# Patient Record
Sex: Female | Born: 1950
Health system: Southern US, Community
[De-identification: ages and names within clinical notes are randomized; demographics above are authoritative.]

## PROBLEM LIST (undated history)

## (undated) DIAGNOSIS — E538 Deficiency of other specified B group vitamins: Secondary | ICD-10-CM

## (undated) DIAGNOSIS — M839 Adult osteomalacia, unspecified: Secondary | ICD-10-CM

## (undated) DIAGNOSIS — K760 Fatty (change of) liver, not elsewhere classified: Secondary | ICD-10-CM

## (undated) DIAGNOSIS — K5090 Crohn's disease, unspecified, without complications: Secondary | ICD-10-CM

## (undated) DIAGNOSIS — F419 Anxiety disorder, unspecified: Principal | ICD-10-CM

## (undated) DIAGNOSIS — D539 Nutritional anemia, unspecified: Secondary | ICD-10-CM

## (undated) DIAGNOSIS — J302 Other seasonal allergic rhinitis: Secondary | ICD-10-CM

## (undated) DIAGNOSIS — J3089 Other allergic rhinitis: Secondary | ICD-10-CM

## (undated) DIAGNOSIS — M130 Polyarthritis, unspecified: Secondary | ICD-10-CM

## (undated) DIAGNOSIS — T7840XA Allergy, unspecified, initial encounter: Secondary | ICD-10-CM

## (undated) DIAGNOSIS — K219 Gastro-esophageal reflux disease without esophagitis: Secondary | ICD-10-CM

## (undated) DIAGNOSIS — Z8719 Personal history of other diseases of the digestive system: Secondary | ICD-10-CM

## (undated) DIAGNOSIS — R011 Cardiac murmur, unspecified: Secondary | ICD-10-CM

## (undated) DIAGNOSIS — F329 Major depressive disorder, single episode, unspecified: Principal | ICD-10-CM

## (undated) DIAGNOSIS — K648 Other hemorrhoids: Secondary | ICD-10-CM

## (undated) DIAGNOSIS — H905 Unspecified sensorineural hearing loss: Secondary | ICD-10-CM

## (undated) DIAGNOSIS — Z5189 Encounter for other specified aftercare: Secondary | ICD-10-CM

## (undated) DIAGNOSIS — H269 Unspecified cataract: Secondary | ICD-10-CM

## (undated) DIAGNOSIS — I10 Essential (primary) hypertension: Secondary | ICD-10-CM

## (undated) HISTORY — DX: Encounter for other specified aftercare: Z51.89

## (undated) HISTORY — DX: Allergy, unspecified, initial encounter: T78.40XA

## (undated) HISTORY — DX: Fatty (change of) liver, not elsewhere classified: K76.0

## (undated) HISTORY — DX: Crohn's disease, unspecified, without complications: K50.90

## (undated) HISTORY — PX: OTHER SURGICAL HISTORY: SHX169

## (undated) HISTORY — DX: Other hemorrhoids: K64.8

## (undated) HISTORY — DX: Personal history of other diseases of the digestive system: Z87.19

## (undated) HISTORY — PX: APPENDECTOMY: SHX54

## (undated) HISTORY — DX: Essential (primary) hypertension: I10

## (undated) HISTORY — DX: Other seasonal allergic rhinitis: J30.2

## (undated) HISTORY — PX: UPPER GASTROINTESTINAL ENDOSCOPY: SHX188

## (undated) HISTORY — DX: Deficiency of other specified B group vitamins: E53.8

## (undated) HISTORY — DX: Adult osteomalacia, unspecified: M83.9

## (undated) HISTORY — DX: Major depressive disorder, single episode, unspecified: F32.9

## (undated) HISTORY — PX: SMALL INTESTINE SURGERY: SHX150

## (undated) HISTORY — DX: Nutritional anemia, unspecified: D53.9

## (undated) HISTORY — DX: Polyarthritis, unspecified: M13.0

## (undated) HISTORY — DX: Gastro-esophageal reflux disease without esophagitis: K21.9

## (undated) HISTORY — DX: Unspecified sensorineural hearing loss: H90.5

## (undated) HISTORY — DX: Cardiac murmur, unspecified: R01.1

## (undated) HISTORY — DX: Unspecified cataract: H26.9

## (undated) HISTORY — DX: Other seasonal allergic rhinitis: J30.89

## (undated) HISTORY — DX: Anxiety disorder, unspecified: F41.9

---

## 1973-10-26 HISTORY — PX: TOTAL ABDOMINAL HYSTERECTOMY: SHX209

## 1978-10-26 HISTORY — PX: HEMICOLECTOMY: SHX854

## 2003-12-05 ENCOUNTER — Encounter: Admission: RE | Admit: 2003-12-05 | Discharge: 2003-12-05 | Payer: Self-pay | Admitting: Family Medicine

## 2004-07-15 ENCOUNTER — Other Ambulatory Visit: Admission: RE | Admit: 2004-07-15 | Discharge: 2004-07-15 | Payer: Self-pay | Admitting: *Deleted

## 2004-07-15 ENCOUNTER — Encounter: Payer: Self-pay | Admitting: Family Medicine

## 2004-07-15 LAB — CONVERTED CEMR LAB: Pap Smear: NORMAL

## 2004-10-21 ENCOUNTER — Ambulatory Visit: Payer: Self-pay | Admitting: Internal Medicine

## 2004-12-08 ENCOUNTER — Ambulatory Visit: Payer: Self-pay | Admitting: Internal Medicine

## 2004-12-10 ENCOUNTER — Ambulatory Visit: Payer: Self-pay | Admitting: Internal Medicine

## 2005-01-02 ENCOUNTER — Encounter: Admission: RE | Admit: 2005-01-02 | Discharge: 2005-01-02 | Payer: Self-pay | Admitting: Family Medicine

## 2005-02-25 ENCOUNTER — Ambulatory Visit: Payer: Self-pay | Admitting: Internal Medicine

## 2005-02-25 HISTORY — PX: COLONOSCOPY: SHX174

## 2005-02-25 HISTORY — PX: ESOPHAGOGASTRODUODENOSCOPY: SHX1529

## 2005-03-02 ENCOUNTER — Encounter: Payer: Self-pay | Admitting: Internal Medicine

## 2005-03-02 ENCOUNTER — Ambulatory Visit (HOSPITAL_COMMUNITY): Admission: RE | Admit: 2005-03-02 | Discharge: 2005-03-02 | Payer: Self-pay | Admitting: Internal Medicine

## 2005-03-30 ENCOUNTER — Ambulatory Visit: Payer: Self-pay | Admitting: Internal Medicine

## 2005-05-12 ENCOUNTER — Ambulatory Visit: Payer: Self-pay | Admitting: Internal Medicine

## 2005-05-13 ENCOUNTER — Ambulatory Visit: Payer: Self-pay | Admitting: Internal Medicine

## 2005-05-14 ENCOUNTER — Ambulatory Visit: Payer: Self-pay | Admitting: Internal Medicine

## 2005-05-28 ENCOUNTER — Ambulatory Visit: Payer: Self-pay | Admitting: Internal Medicine

## 2006-01-14 ENCOUNTER — Encounter: Admission: RE | Admit: 2006-01-14 | Discharge: 2006-01-14 | Payer: Self-pay | Admitting: Family Medicine

## 2006-01-22 ENCOUNTER — Ambulatory Visit: Payer: Self-pay | Admitting: Family Medicine

## 2006-01-26 ENCOUNTER — Ambulatory Visit: Payer: Self-pay | Admitting: Family Medicine

## 2006-01-29 ENCOUNTER — Encounter: Admission: RE | Admit: 2006-01-29 | Discharge: 2006-01-29 | Payer: Self-pay | Admitting: Family Medicine

## 2006-02-04 ENCOUNTER — Ambulatory Visit: Payer: Self-pay | Admitting: Family Medicine

## 2006-03-11 ENCOUNTER — Ambulatory Visit: Payer: Self-pay | Admitting: Family Medicine

## 2006-03-11 LAB — FECAL OCCULT BLOOD, GUAIAC: Fecal Occult Blood: NEGATIVE

## 2006-06-02 ENCOUNTER — Ambulatory Visit: Payer: Self-pay | Admitting: Family Medicine

## 2006-06-03 ENCOUNTER — Ambulatory Visit: Payer: Self-pay | Admitting: Professional

## 2006-06-17 ENCOUNTER — Ambulatory Visit: Payer: Self-pay | Admitting: Family Medicine

## 2006-10-31 ENCOUNTER — Encounter: Payer: Self-pay | Admitting: Internal Medicine

## 2007-01-17 ENCOUNTER — Encounter: Admission: RE | Admit: 2007-01-17 | Discharge: 2007-01-17 | Payer: Self-pay | Admitting: Family Medicine

## 2007-01-19 ENCOUNTER — Encounter: Payer: Self-pay | Admitting: Family Medicine

## 2007-01-19 DIAGNOSIS — Z9049 Acquired absence of other specified parts of digestive tract: Secondary | ICD-10-CM | POA: Insufficient documentation

## 2007-01-19 DIAGNOSIS — K5090 Crohn's disease, unspecified, without complications: Secondary | ICD-10-CM | POA: Insufficient documentation

## 2007-01-19 DIAGNOSIS — E8941 Symptomatic postprocedural ovarian failure: Secondary | ICD-10-CM

## 2007-03-17 ENCOUNTER — Ambulatory Visit: Payer: Self-pay | Admitting: Internal Medicine

## 2007-03-25 ENCOUNTER — Telehealth (INDEPENDENT_AMBULATORY_CARE_PROVIDER_SITE_OTHER): Payer: Self-pay | Admitting: *Deleted

## 2007-03-30 ENCOUNTER — Encounter (INDEPENDENT_AMBULATORY_CARE_PROVIDER_SITE_OTHER): Payer: Self-pay | Admitting: *Deleted

## 2007-03-30 ENCOUNTER — Emergency Department (HOSPITAL_COMMUNITY): Admission: EM | Admit: 2007-03-30 | Discharge: 2007-03-30 | Payer: Self-pay | Admitting: Emergency Medicine

## 2007-03-31 ENCOUNTER — Inpatient Hospital Stay (HOSPITAL_COMMUNITY): Admission: EM | Admit: 2007-03-31 | Discharge: 2007-04-03 | Payer: Self-pay | Admitting: Emergency Medicine

## 2007-04-06 ENCOUNTER — Ambulatory Visit: Payer: Self-pay | Admitting: Internal Medicine

## 2007-04-07 ENCOUNTER — Ambulatory Visit: Payer: Self-pay | Admitting: Internal Medicine

## 2007-05-02 ENCOUNTER — Encounter (INDEPENDENT_AMBULATORY_CARE_PROVIDER_SITE_OTHER): Payer: Self-pay | Admitting: *Deleted

## 2007-05-17 ENCOUNTER — Ambulatory Visit: Payer: Self-pay | Admitting: Internal Medicine

## 2007-08-03 ENCOUNTER — Ambulatory Visit: Payer: Self-pay | Admitting: Family Medicine

## 2007-08-03 LAB — CONVERTED CEMR LAB: Anti Nuclear Antibody(ANA): NEGATIVE

## 2007-08-04 ENCOUNTER — Encounter: Admission: RE | Admit: 2007-08-04 | Discharge: 2007-08-04 | Payer: Self-pay | Admitting: Family Medicine

## 2007-08-05 ENCOUNTER — Telehealth: Payer: Self-pay | Admitting: Family Medicine

## 2007-08-05 LAB — CONVERTED CEMR LAB: Rhuematoid fact SerPl-aCnc: <20 intl units/mL — ABNORMAL LOW (ref 0.0–20.0)

## 2007-08-14 ENCOUNTER — Encounter: Admission: RE | Admit: 2007-08-14 | Discharge: 2007-08-14 | Payer: Self-pay | Admitting: Orthopedic Surgery

## 2007-11-03 ENCOUNTER — Ambulatory Visit: Payer: Self-pay | Admitting: Internal Medicine

## 2007-11-08 ENCOUNTER — Ambulatory Visit: Payer: Self-pay | Admitting: Internal Medicine

## 2007-11-08 LAB — CONVERTED CEMR LAB
BUN: 5 mg/dL — ABNORMAL LOW (ref 6–23)
Creatinine, Ser: 0.7 mg/dL (ref 0.4–1.2)

## 2007-11-10 ENCOUNTER — Encounter: Admission: RE | Admit: 2007-11-10 | Discharge: 2007-11-10 | Payer: Self-pay | Admitting: Internal Medicine

## 2008-01-17 ENCOUNTER — Ambulatory Visit: Payer: Self-pay | Admitting: Internal Medicine

## 2008-01-17 LAB — CONVERTED CEMR LAB
Basophils Absolute: 0.1 10*3/uL (ref 0.0–0.1)
Basophils Relative: 0.9 % (ref 0.0–1.0)
Eosinophils Absolute: 0.1 10*3/uL (ref 0.0–0.6)
Eosinophils Relative: 2.1 % (ref 0.0–5.0)
Hemoglobin: 12 g/dL (ref 12.0–15.0)
Monocytes Absolute: 0.5 10*3/uL (ref 0.2–0.7)
Neutrophils Relative %: 56.6 % (ref 43.0–77.0)
RBC: 3.79 M/uL — ABNORMAL LOW (ref 3.87–5.11)
RDW: 12.7 % (ref 11.5–14.6)
WBC: 6.3 10*3/uL (ref 4.5–10.5)

## 2008-01-18 ENCOUNTER — Encounter: Admission: RE | Admit: 2008-01-18 | Discharge: 2008-01-18 | Payer: Self-pay | Admitting: Family Medicine

## 2008-01-19 ENCOUNTER — Encounter (INDEPENDENT_AMBULATORY_CARE_PROVIDER_SITE_OTHER): Payer: Self-pay | Admitting: *Deleted

## 2008-03-21 IMAGING — CR DG SHOULDER 2+V*L*
3 series · 3 of 3 positions shown · non-contrast
Comparison: none

CLINICAL DATA: Left shoulder pain since [REDACTED]. 
 LEFT SHOULDER ? THREE VIEWS:
 Glenohumeral joint is unremarkable.  There is a normal distance between the humeral head and the acromial arch.  There may be a subacromial spur which could predispose to impingement.  The AC joint is unremarkable.  No sign of AVN or other focal lesion.

[w shoulder ap internal left]
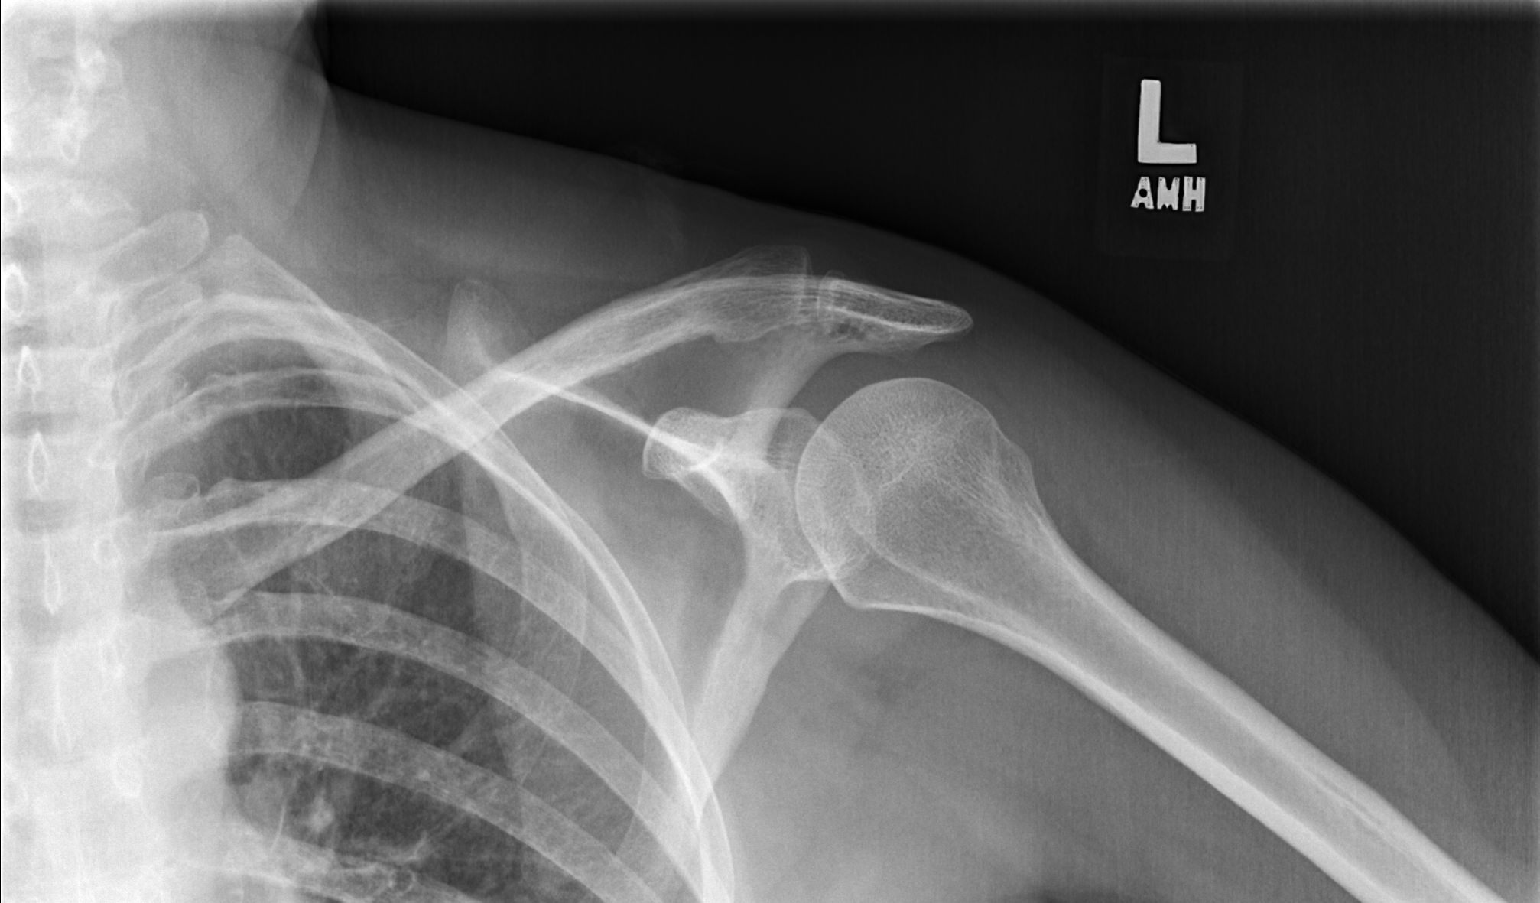

[w shoulder ap external left *]
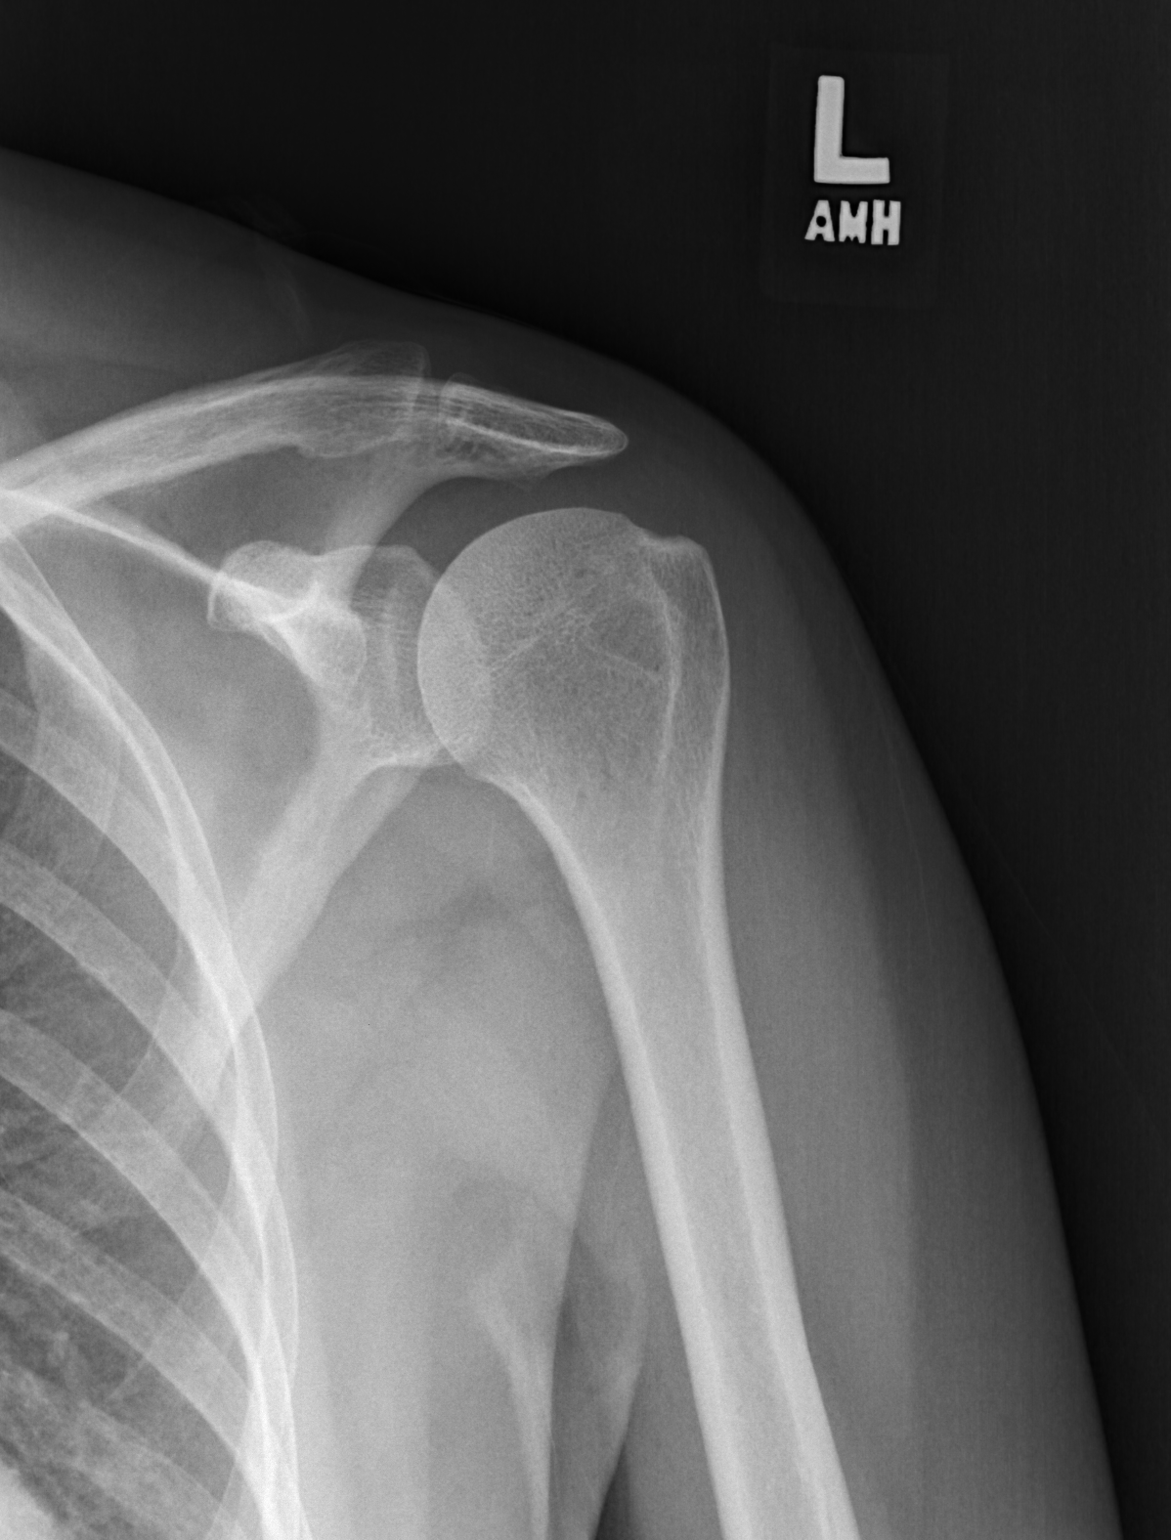

[w shoulder axillary left *]
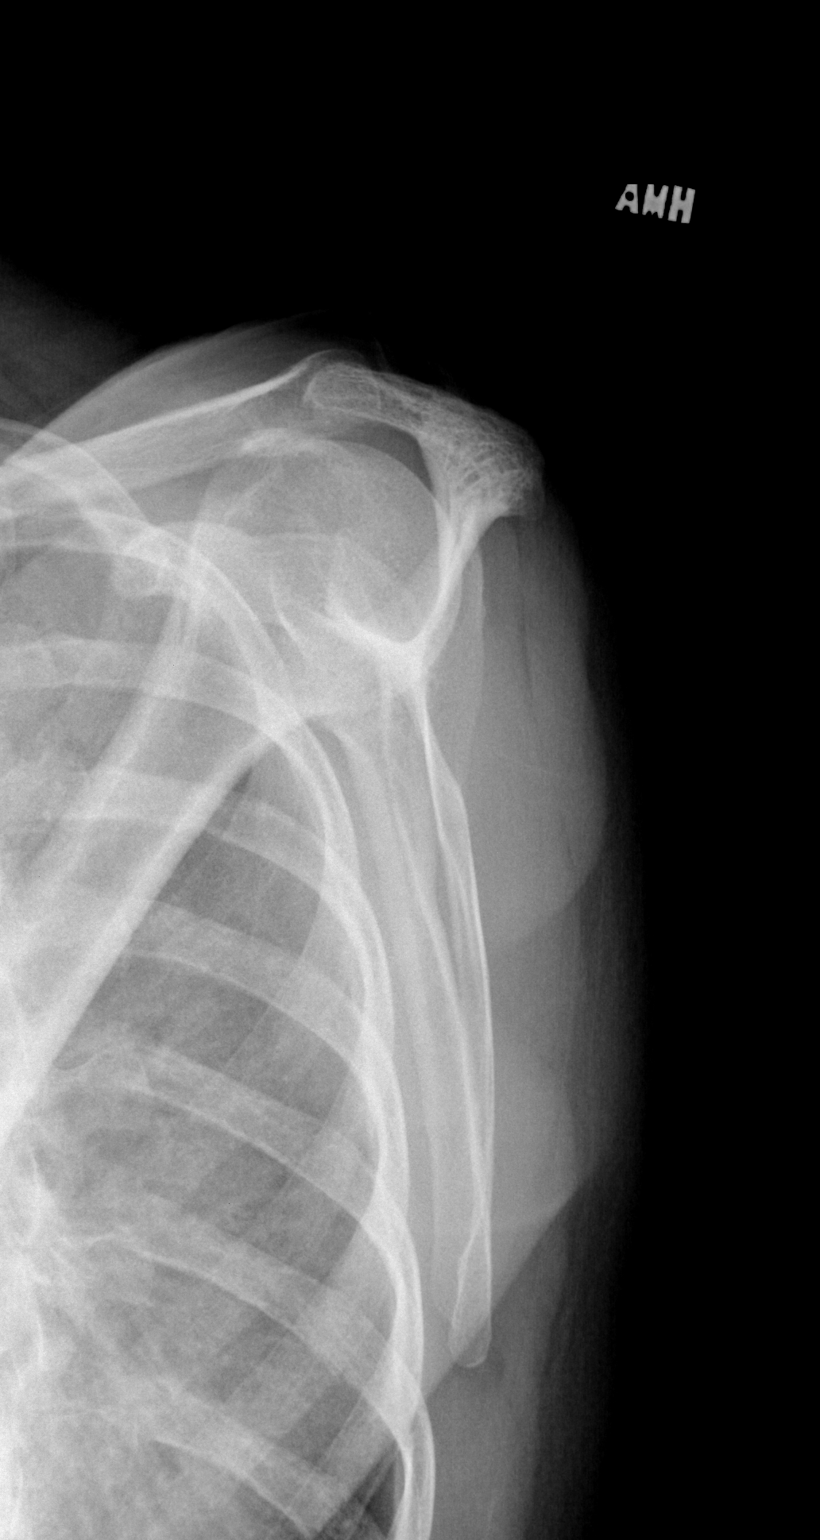

[3 of 3 positions shown; findings below may reference images not displayed]

IMPRESSION: No pronounced abnormality.  Subacromial spur could predispose to rotator cuff disease.

## 2008-07-11 ENCOUNTER — Ambulatory Visit: Payer: Self-pay | Admitting: Family Medicine

## 2008-07-13 ENCOUNTER — Telehealth (INDEPENDENT_AMBULATORY_CARE_PROVIDER_SITE_OTHER): Payer: Self-pay | Admitting: *Deleted

## 2008-08-27 ENCOUNTER — Ambulatory Visit: Payer: Self-pay | Admitting: Family Medicine

## 2008-08-27 DIAGNOSIS — I10 Essential (primary) hypertension: Secondary | ICD-10-CM

## 2008-10-04 ENCOUNTER — Ambulatory Visit: Payer: Self-pay | Admitting: Family Medicine

## 2008-10-04 DIAGNOSIS — M839 Adult osteomalacia, unspecified: Secondary | ICD-10-CM | POA: Insufficient documentation

## 2008-10-04 LAB — CONVERTED CEMR LAB
Basophils Absolute: 0 10*3/uL (ref 0.0–0.1)
Calcium: 9.6 mg/dL (ref 8.4–10.5)
Creatinine, Ser: 0.8 mg/dL (ref 0.4–1.2)
Eosinophils Absolute: 0.1 10*3/uL (ref 0.0–0.7)
Eosinophils Relative: 2.1 % (ref 0.0–5.0)
GFR calc Af Amer: 95 mL/min
GFR calc non Af Amer: 78 mL/min
Hemoglobin: 13.3 g/dL (ref 12.0–15.0)
Monocytes Relative: 7.4 % (ref 3.0–12.0)
Neutro Abs: 2.8 10*3/uL (ref 1.4–7.7)
WBC: 4.5 10*3/uL (ref 4.5–10.5)

## 2008-10-09 ENCOUNTER — Ambulatory Visit: Payer: Self-pay | Admitting: Family Medicine

## 2009-01-22 ENCOUNTER — Encounter (INDEPENDENT_AMBULATORY_CARE_PROVIDER_SITE_OTHER): Payer: Self-pay | Admitting: *Deleted

## 2009-01-22 ENCOUNTER — Encounter: Admission: RE | Admit: 2009-01-22 | Discharge: 2009-01-22 | Payer: Self-pay | Admitting: Family Medicine

## 2009-02-15 ENCOUNTER — Telehealth: Payer: Self-pay | Admitting: Internal Medicine

## 2009-02-28 ENCOUNTER — Ambulatory Visit: Payer: Self-pay | Admitting: Internal Medicine

## 2009-03-01 ENCOUNTER — Telehealth: Payer: Self-pay | Admitting: Internal Medicine

## 2009-04-03 ENCOUNTER — Ambulatory Visit: Payer: Self-pay | Admitting: Family Medicine

## 2009-04-03 LAB — CONVERTED CEMR LAB
ALT: 43 units/L — ABNORMAL HIGH (ref 0–35)
AST: 42 units/L — ABNORMAL HIGH (ref 0–37)
Alkaline Phosphatase: 74 units/L (ref 39–117)
Bilirubin, Direct: 0.1 mg/dL (ref 0.0–0.3)
Cholesterol: 165 mg/dL (ref 0–200)
Creatinine, Ser: 0.7 mg/dL (ref 0.4–1.2)
Eosinophils Relative: 2.2 % (ref 0.0–5.0)
GFR calc non Af Amer: 91.15 mL/min (ref >60)
HCT: 35.2 % — ABNORMAL LOW (ref 36.0–46.0)
HDL: 76.1 mg/dL (ref >39.00)
Hemoglobin: 12.4 g/dL (ref 12.0–15.0)
LDL Cholesterol: 79 mg/dL (ref 0–99)
MCHC: 35.1 g/dL (ref 30.0–36.0)
MCV: 99.5 fL (ref 78.0–100.0)
Neutro Abs: 2.3 10*3/uL (ref 1.4–7.7)
RBC: 3.54 M/uL — ABNORMAL LOW (ref 3.87–5.11)
Total Bilirubin: 1 mg/dL (ref 0.3–1.2)
Triglycerides: 50 mg/dL (ref 0.0–149.0)
VLDL: 10 mg/dL (ref 0.0–40.0)
WBC: 3.8 10*3/uL — ABNORMAL LOW (ref 4.5–10.5)

## 2009-04-10 ENCOUNTER — Ambulatory Visit: Payer: Self-pay | Admitting: Family Medicine

## 2009-04-10 DIAGNOSIS — E538 Deficiency of other specified B group vitamins: Secondary | ICD-10-CM

## 2009-04-10 DIAGNOSIS — R197 Diarrhea, unspecified: Secondary | ICD-10-CM

## 2009-04-10 DIAGNOSIS — M130 Polyarthritis, unspecified: Secondary | ICD-10-CM

## 2009-04-17 ENCOUNTER — Ambulatory Visit: Payer: Self-pay | Admitting: Family Medicine

## 2009-04-19 LAB — CONVERTED CEMR LAB: Vit D, 25-Hydroxy: 32 ng/mL (ref 30–89)

## 2009-05-01 ENCOUNTER — Telehealth: Payer: Self-pay | Admitting: Internal Medicine

## 2009-07-22 ENCOUNTER — Encounter: Payer: Self-pay | Admitting: Internal Medicine

## 2009-10-29 ENCOUNTER — Telehealth: Payer: Self-pay | Admitting: Internal Medicine

## 2009-10-30 ENCOUNTER — Ambulatory Visit: Payer: Self-pay | Admitting: Internal Medicine

## 2009-10-31 LAB — CONVERTED CEMR LAB
ALT: 27 units/L (ref 0–35)
AST: 27 units/L (ref 0–37)
BUN: 9 mg/dL (ref 6–23)
Basophils Absolute: 0 10*3/uL (ref 0.0–0.1)
Basophils Relative: 0.2 % (ref 0.0–3.0)
Eosinophils Relative: 1.2 % (ref 0.0–5.0)
Glucose, Bld: 95 mg/dL (ref 70–99)
HCT: 36 % (ref 36.0–46.0)
Hemoglobin: 11.8 g/dL — ABNORMAL LOW (ref 12.0–15.0)
MCHC: 32.7 g/dL (ref 30.0–36.0)
MCV: 101.3 fL — ABNORMAL HIGH (ref 78.0–100.0)
Monocytes Absolute: 0.6 10*3/uL (ref 0.1–1.0)
Neutrophils Relative %: 54.9 % (ref 43.0–77.0)
RDW: 14.3 % (ref 11.5–14.6)
Saturation Ratios: 14.6 % — ABNORMAL LOW (ref 20.0–50.0)
Sed Rate: 25 mm/hr — ABNORMAL HIGH (ref 0–22)
Total Bilirubin: 0.9 mg/dL (ref 0.3–1.2)

## 2009-12-27 ENCOUNTER — Telehealth: Payer: Self-pay | Admitting: Internal Medicine

## 2010-01-02 ENCOUNTER — Encounter: Payer: Self-pay | Admitting: Internal Medicine

## 2010-01-27 ENCOUNTER — Encounter (INDEPENDENT_AMBULATORY_CARE_PROVIDER_SITE_OTHER): Payer: Self-pay | Admitting: *Deleted

## 2010-01-27 ENCOUNTER — Encounter: Admission: RE | Admit: 2010-01-27 | Discharge: 2010-01-27 | Payer: Self-pay | Admitting: Family Medicine

## 2010-01-27 LAB — HM MAMMOGRAPHY: HM Mammogram: NORMAL

## 2010-02-06 ENCOUNTER — Encounter (INDEPENDENT_AMBULATORY_CARE_PROVIDER_SITE_OTHER): Payer: Self-pay | Admitting: *Deleted

## 2010-02-10 ENCOUNTER — Ambulatory Visit: Payer: Self-pay | Admitting: Internal Medicine

## 2010-02-20 ENCOUNTER — Encounter: Payer: Self-pay | Admitting: Internal Medicine

## 2010-02-26 ENCOUNTER — Ambulatory Visit: Payer: Self-pay | Admitting: Internal Medicine

## 2010-02-26 HISTORY — PX: COLONOSCOPY: SHX174

## 2010-02-26 LAB — HM COLONOSCOPY

## 2010-02-27 ENCOUNTER — Telehealth: Payer: Self-pay | Admitting: Internal Medicine

## 2010-02-27 ENCOUNTER — Encounter: Payer: Self-pay | Admitting: Internal Medicine

## 2010-03-25 ENCOUNTER — Telehealth: Payer: Self-pay | Admitting: Internal Medicine

## 2010-03-27 ENCOUNTER — Ambulatory Visit: Payer: Self-pay | Admitting: Internal Medicine

## 2010-03-28 LAB — CONVERTED CEMR LAB
ALT: 25 units/L (ref 0–35)
Albumin: 4 g/dL (ref 3.5–5.2)
Basophils Absolute: 0 10*3/uL (ref 0.0–0.1)
Bilirubin, Direct: 0.1 mg/dL (ref 0.0–0.3)
CO2: 32 meq/L (ref 19–32)
Calcium: 9 mg/dL (ref 8.4–10.5)
Creatinine, Ser: 0.6 mg/dL (ref 0.4–1.2)
HCT: 34.2 % — ABNORMAL LOW (ref 36.0–46.0)
Hemoglobin: 11.9 g/dL — ABNORMAL LOW (ref 12.0–15.0)
Iron: 60 ug/dL (ref 42–145)
Lymphs Abs: 1.7 10*3/uL (ref 0.7–4.0)
MCV: 98.9 fL (ref 78.0–100.0)
Neutrophils Relative %: 55.1 % (ref 43.0–77.0)
Total Bilirubin: 0.7 mg/dL (ref 0.3–1.2)
Vitamin B-12: 367 pg/mL (ref 211–911)
WBC: 5.1 10*3/uL (ref 4.5–10.5)

## 2010-04-09 ENCOUNTER — Encounter: Payer: Self-pay | Admitting: Family Medicine

## 2010-04-09 ENCOUNTER — Ambulatory Visit: Payer: Self-pay | Admitting: Family Medicine

## 2010-04-10 LAB — CONVERTED CEMR LAB
AST: 27 units/L (ref 0–37)
Albumin: 4.5 g/dL (ref 3.5–5.2)
Basophils Absolute: 0 10*3/uL (ref 0.0–0.1)
Basophils Relative: 0.6 % (ref 0.0–3.0)
CO2: 30 meq/L (ref 19–32)
Chloride: 102 meq/L (ref 96–112)
Eosinophils Relative: 1.4 % (ref 0.0–5.0)
Glucose, Bld: 85 mg/dL (ref 70–99)
LDL Cholesterol: 97 mg/dL (ref 0–99)
MCV: 100.9 fL — ABNORMAL HIGH (ref 78.0–100.0)
Monocytes Relative: 8.7 % (ref 3.0–12.0)
Neutro Abs: 2.9 10*3/uL (ref 1.4–7.7)
Neutrophils Relative %: 53.1 % (ref 43.0–77.0)
Platelets: 263 10*3/uL (ref 150.0–400.0)
Potassium: 4.1 meq/L (ref 3.5–5.1)
Sodium: 139 meq/L (ref 135–145)
TSH: 3.29 microintl units/mL (ref 0.35–5.50)
Total CHOL/HDL Ratio: 2
Triglycerides: 46 mg/dL (ref 0.0–149.0)
VLDL: 9.2 mg/dL (ref 0.0–40.0)
Vit D, 25-Hydroxy: 30 ng/mL (ref 30–89)

## 2010-04-15 ENCOUNTER — Ambulatory Visit: Payer: Self-pay | Admitting: Family Medicine

## 2010-04-15 DIAGNOSIS — D509 Iron deficiency anemia, unspecified: Secondary | ICD-10-CM | POA: Insufficient documentation

## 2010-04-15 DIAGNOSIS — H6120 Impacted cerumen, unspecified ear: Secondary | ICD-10-CM

## 2010-05-29 ENCOUNTER — Encounter (INDEPENDENT_AMBULATORY_CARE_PROVIDER_SITE_OTHER): Payer: Self-pay | Admitting: *Deleted

## 2010-08-14 ENCOUNTER — Encounter (INDEPENDENT_AMBULATORY_CARE_PROVIDER_SITE_OTHER): Payer: Self-pay | Admitting: *Deleted

## 2010-08-15 ENCOUNTER — Encounter: Payer: Self-pay | Admitting: Internal Medicine

## 2010-08-15 ENCOUNTER — Ambulatory Visit: Payer: Self-pay | Admitting: Internal Medicine

## 2010-08-15 DIAGNOSIS — R51 Headache: Secondary | ICD-10-CM | POA: Insufficient documentation

## 2010-08-15 DIAGNOSIS — R519 Headache, unspecified: Secondary | ICD-10-CM | POA: Insufficient documentation

## 2010-08-15 LAB — CONVERTED CEMR LAB
Specific Gravity, Urine: 1.025
Urine crystals, microscopic: 0 /hpf

## 2010-08-16 ENCOUNTER — Encounter: Payer: Self-pay | Admitting: Family Medicine

## 2010-08-19 ENCOUNTER — Ambulatory Visit: Payer: Self-pay | Admitting: Internal Medicine

## 2010-08-19 ENCOUNTER — Ambulatory Visit: Payer: Self-pay | Admitting: Family Medicine

## 2010-08-19 DIAGNOSIS — R109 Unspecified abdominal pain: Secondary | ICD-10-CM | POA: Insufficient documentation

## 2010-08-19 LAB — CONVERTED CEMR LAB
Bilirubin Urine: NEGATIVE
Glucose, Urine, Semiquant: NEGATIVE
Ketones, urine, test strip: NEGATIVE
Nitrite: NEGATIVE
Protein, U semiquant: NEGATIVE
Urobilinogen, UA: 0.2
WBC Urine, dipstick: NEGATIVE
pH: 5

## 2010-09-05 ENCOUNTER — Telehealth: Payer: Self-pay | Admitting: Internal Medicine

## 2010-09-05 ENCOUNTER — Ambulatory Visit: Payer: Self-pay | Admitting: Internal Medicine

## 2010-09-05 ENCOUNTER — Encounter: Payer: Self-pay | Admitting: Family Medicine

## 2010-09-05 DIAGNOSIS — R0982 Postnasal drip: Secondary | ICD-10-CM

## 2010-09-05 LAB — CONVERTED CEMR LAB
Glucose, Urine, Semiquant: NEGATIVE
WBC Urine, dipstick: NEGATIVE

## 2010-09-08 LAB — CONVERTED CEMR LAB: Crystals: NONE SEEN

## 2010-09-29 ENCOUNTER — Ambulatory Visit: Payer: Self-pay | Admitting: Internal Medicine

## 2010-10-31 ENCOUNTER — Encounter: Payer: Self-pay | Admitting: Internal Medicine

## 2010-11-04 ENCOUNTER — Ambulatory Visit
Admission: RE | Admit: 2010-11-04 | Discharge: 2010-11-04 | Payer: Self-pay | Source: Home / Self Care | Attending: Internal Medicine | Admitting: Internal Medicine

## 2010-11-04 ENCOUNTER — Other Ambulatory Visit: Payer: Self-pay | Admitting: Internal Medicine

## 2010-11-04 LAB — CBC WITH DIFFERENTIAL/PLATELET
Basophils Absolute: 0 10*3/uL (ref 0.0–0.1)
Basophils Relative: 0.4 % (ref 0.0–3.0)
Eosinophils Absolute: 0.1 10*3/uL (ref 0.0–0.7)
Eosinophils Relative: 0.9 % (ref 0.0–5.0)
HCT: 34 % — ABNORMAL LOW (ref 36.0–46.0)
Hemoglobin: 11.7 g/dL — ABNORMAL LOW (ref 12.0–15.0)
Lymphocytes Relative: 31.2 % (ref 12.0–46.0)
Lymphs Abs: 1.8 10*3/uL (ref 0.7–4.0)
MCHC: 34.5 g/dL (ref 30.0–36.0)
MCV: 98.8 fl (ref 78.0–100.0)
Monocytes Absolute: 0.5 10*3/uL (ref 0.1–1.0)
Monocytes Relative: 8.2 % (ref 3.0–12.0)
Neutro Abs: 3.5 10*3/uL (ref 1.4–7.7)
Neutrophils Relative %: 59.3 % (ref 43.0–77.0)
Platelets: 313 10*3/uL (ref 150.0–400.0)
RBC: 3.44 Mil/uL — ABNORMAL LOW (ref 3.87–5.11)
RDW: 14.2 % (ref 11.5–14.6)
WBC: 5.8 10*3/uL (ref 4.5–10.5)

## 2010-11-04 LAB — FERRITIN: Ferritin: 193.6 ng/mL (ref 10.0–291.0)

## 2010-11-04 LAB — IBC PANEL
Iron: 87 ug/dL (ref 42–145)
Saturation Ratios: 21.3 % (ref 20.0–50.0)
Transferrin: 291.7 mg/dL (ref 212.0–360.0)

## 2010-11-04 LAB — B12 AND FOLATE PANEL
Folate: 14.2 ng/mL
Vitamin B-12: 802 pg/mL (ref 211–911)

## 2010-11-07 ENCOUNTER — Ambulatory Visit
Admission: RE | Admit: 2010-11-07 | Discharge: 2010-11-07 | Payer: Self-pay | Source: Home / Self Care | Attending: Internal Medicine | Admitting: Internal Medicine

## 2010-11-13 ENCOUNTER — Encounter: Payer: Self-pay | Admitting: Internal Medicine

## 2010-11-25 NOTE — Progress Notes (Signed)
        Additional Follow-up for Phone Call Additional follow up Details #2::    During call back this morning patient states she does not feel well had fever "101" lastnight,  abd. cramps and soreness. Had food lastnight, nausea this am. patient is at work. patient did state she had hard time with prep. [place] notified and then notified Dr.Uriah Trueba. order given to take cipro 251m two times a day x 7days given patient history of crohns. Notified patient of this and encouraged patient to call [place] today if not better and to drink fluids. pt understands and agrees. Follow-up by: RSundra AlandRN,  Feb 27, 2010 10:43 AM

## 2010-11-25 NOTE — Assessment & Plan Note (Signed)
Summary: 3 WEEK FOLLOW UP/[facility]   Vital Signs:  Patient profile:   60 year old female Weight:      114 pounds Temp:     98.1 degrees F oral Pulse rate:   68 / minute Pulse rhythm:   regular BP sitting:   110 / 70  (left arm) Cuff size:   regular  Vitals Entered By: Maudie Mercury Dance CMA Deborra Medina) (September 05, 2010 3:58 PM) CC: 3 week follow up   History of Present Illness: CC: f/u blood in urine.  Still with burning with urination.  still with mild urgency.  Overall feeling better though.  no noted gross hematuria.  Again having sinus congestion.  Now with sores in nose on L side.  No discharge, mild postnasal drip.  + having to clear throat often.  Does get hoarse on occasion.  Eyes burning some and swollen.  + chills last night.  No fevers.  + sinus headaches.  No purulent mucous.  No ear pain/tooth pain.  No sick contacts at home (lives alone).    chrohn's disease, stable currently.  Current Medications (verified): 1)  Mercaptopurine 50 Mg Tabs (Mercaptopurine) .... Take 1 Tablet By Mouth Once A Day. 2)  Amlodipine Besylate 5 Mg Tabs (Amlodipine Besylate) .... One Tab By Mouth Once Daily 3)  Ferrous Sulfate 325 (65 Fe) Mg Tabs (Ferrous Sulfate) .... Take As Directed. Must Have Office Visit For Further Refills! 4)  Cyanocobalamin 1000 Mcg/ml Soln (Cyanocobalamin) .... Once A Month At Work  Allergies (verified): No Known Drug Allergies  Past History:  Past Medical History: Last updated: 08/15/2010 HTN Chrohn's Disease s/p colectomy h/o SBO chronic diarrhea OSTEOMALACIA, UNSPECIFIED (ICD-268.2) Hx of MACROCYTIC ANEMIA (ICD-281.9) Hx of VITAMIN B12 DEFICIENCY (ICD-266.2) HEMORRHOIDS, INTERNAL (ICD-455.0) POLYARTHRITIS NOS, MULTIPLE SITES (ICD-716.59) MENOPAUSE, SURGICAL (ICD-627.4) LOSS, CENTRAL HEARING (ICD-389.14)    Social History: Last updated: 04/10/2009 Married  Divorced 1989   3children Occupation: Surveyor, mining Alcohol Use - no Illicit Drug Use -  no Patient is a former smoker.  Daily Caffeine Use  Past Surgical History: HYSTERECTOMY:1975 Appendectomy Ileal resection x 3 and subtotal colon resection COLONOSCOPY INT. HEMMS: CROHNS: NARROW ANAST. 02/25/2005 EGD: DILATED: 02/25/2005 DEXA NORMAL: 01/29/2006  Colonoscopy Prior R Hemicolectomy o/w benign (Dr Olevia Perches) 02/26/2010            5 yrs B 12 INJECTIONS EVERY MONTH   Review of Systems       per HPI  Physical Exam  General:  NAD, tired Head:  Normocephalic and atraumatic without obvious abnormalities. sinuses NT Eyes:  Conjunctiva clear bilaterally.  Ears:  TMs clear bilateral Nose:  External nasal examination shows no deformity or inflammation. Nasal mucosa are pink and moist without lesions or exudates. Mouth:  Oral mucosa and oropharynx without lesions or exudates.  Teeth in good repair.  No apthous ulcers. Neck:  Supple; no masses or thyromegaly. Lungs:  Normal respiratory effort, chest expands symmetrically. Lungs are clear to auscultation, no crackles or wheezes. Heart:  Normal rate and regular rhythm. S1 and S2 normal without gallop, murmur, click, rub or other extra sounds. Pulses:  2+ rad pulses   Impression & Recommendations:  Problem # 1:  MICROSCOPIC HEMATURIA (ICD-599.72) UA with small blood.  check micro again (sent to lab).  if >3 RBC/HPF, referral to uro for eval persistent micro hematuria.  initially thought to be nephrolithiasis but none seen on KUB previously, resolved on own without needing flomax.  The following medications were removed from the medication list:  Bactrim Ds 800-160 Mg Tabs (Sulfamethoxazole-trimethoprim) ..... One tablet two times a day x 10 days  Orders: UA Dipstick w/o Micro (manual) (81002) T- * Misc. Laboratory test [phone])  Problem # 2:  POSTNASAL DRIP SYNDROME (ICD-784.91) likely from rhinitis.  trial of antihistamine.  holding off on nasal steroid 2/2 hoarseness.  Complete Medication List: 1)  Mercaptopurine 50 Mg Tabs  (Mercaptopurine) .... Take 1 tablet by mouth once a day. 2)  Amlodipine Besylate 5 Mg Tabs (Amlodipine besylate) .... One tab by mouth once daily 3)  Ferrous Sulfate 325 (65 Fe) Mg Tabs (Ferrous sulfate) .... Take as directed. must have office visit for further refills! 4)  Cyanocobalamin 1000 Mcg/ml Soln (Cyanocobalamin) .... Once a month at work  Patient Instructions: 1)  Start with claritin daily to help sinus congestion.   2)  rechecking urine today.  we will call you with results. 3)  Good to see you today! call clinic with questions.   Orders Added: 1)  UA Dipstick w/o Micro (manual) [81002] 2)  T- * Misc. Laboratory test [99999] 3)  Est. Patient Level III [12224]    Current Allergies (reviewed today): No known allergies   Laboratory Results   Urine Tests  Date/Time Received: September 05, 2010 4:46 PM   Routine Urinalysis   Color: yellow Appearance: Clear Glucose: negative   (Normal Range: Negative) Bilirubin: negative   (Normal Range: Negative) Ketone: negative   (Normal Range: Negative) Spec. Gravity: 1.020   (Normal Range: 1.003-1.035) Blood: small   (Normal Range: Negative) pH: 6.0   (Normal Range: 5.0-8.0) Protein: negative   (Normal Range: Negative) Urobilinogen: 0.2   (Normal Range: 0-1) Nitrite: negative   (Normal Range: Negative) Leukocyte Esterace: negative   (Normal Range: Negative)

## 2010-11-25 NOTE — Letter (Signed)
Summary: [facility] Instructions  [facility]  [place], [place] [address]   Phone: [phone]  Fax: [phone]       Michelle Vasquez    October 08, 1951    MRN: [id number]       Procedure Day [name]:  [facility]  02/26/10     Arrival Time:  7:30AM     Procedure Time:  8:30AM     Location of Procedure:                    [name] _  [place] (4th Floor)    [place]  Starting 5 days prior to your procedure 02/21/10 do not eat nuts, seeds, popcorn, corn, beans, peas,  salads, or any raw vegetables.  Do not take any fiber supplements (e.g. Metamucil, Citrucel, and Benefiber). ____________________________________________________________________________________________________   THE DAY BEFORE YOUR PROCEDURE         DATE: 02/25/10  DAY: TUESDAY  1   Drink clear liquids the entire day-NO SOLID FOOD  2   Do not drink anything colored red or purple.  Avoid juices with pulp.  No orange juice.  3   Drink at least 64 oz. (8 glasses) of fluid/clear liquids during the day to prevent dehydration and help the prep work efficiently.  CLEAR LIQUIDS INCLUDE: Water Jello Ice Popsicles Tea (sugar ok, no milk/cream) Powdered fruit flavored drinks Coffee (sugar ok, no milk/cream) Gatorade Juice: apple, white grape, white cranberry  Lemonade Clear bullion, consomm, broth Carbonated beverages (any kind) Strained chicken noodle soup Hard Candy  4   Mix the entire bottle of Miralax with 64 oz. of Gatorade/Powerade in the morning and put in the refrigerator to chill.  5   At 3:00 pm take 2 Dulcolax/Bisacodyl tablets.  6   At 4:30 pm take one Reglan/Metoclopramide tablet.  7  Starting at 5:00 pm drink one 8 oz glass of the Miralax mixture every 15-20 minutes until you have finished drinking the entire 64 oz.  You should finish drinking prep around 7:30 or 8:00 pm.  8   If you are nauseated, you may take the 2nd Reglan/Metoclopramide  tablet at 6:30 pm.        9    At 8:00 pm take 2 more DULCOLAX/Bisacodyl tablets.     THE DAY OF YOUR PROCEDURE      DATE:  02/26/10  DAY: [name]  You may drink clear liquids until 6:30AM (2 HOURS BEFORE PROCEDURE).   MEDICATION INSTRUCTIONS  Unless otherwise instructed, you should take regular prescription medications with a small sip of water as early as possible the morning of your procedure.  .  Additional medication instructions: n/a         OTHER INSTRUCTIONS  You will need a responsible adult at least 61 years of age to accompany you and drive you home.   This person must remain in the waiting room during your procedure.  Wear loose fitting clothing that is easily removed.  Leave jewelry and other valuables at home.  However, you may wish to bring a book to read or an iPod/MP3 player to listen to music as you wait for your procedure to start.  Remove all body piercing jewelry and leave at home.  Total time from sign-in until discharge is approximately 2-3 hours.  You should go home directly after your procedure and rest.  You can resume normal activities the day after your procedure.  The day of your procedure  you should not:   Drive   Make legal decisions   Operate machinery   Drink alcohol   Return to work  You will receive specific instructions about eating, activities and medications before you leave.   The above instructions have been reviewed and explained to me by  [name] RN  February 10, 2010 4:46 PM      I fully understand and can verbalize these instructions _____________________________ Date _______

## 2010-11-25 NOTE — Progress Notes (Signed)
Summary: RX  Medications Added FERROUS SULFATE 325 (65 FE) MG TABS (FERROUS SULFATE) Take as directed.       Phone Note Call from Patient Call back at [phone]   Caller: Patient Call For: Dr Olevia Perches Reason for Call: Refill Medication, Talk to Nurse Details for Reason: RX Summary of Call: Pt needs script for Iron supplement so her Flex Spending will cover it. Initial call taken by: Cora Daniels,  December 27, 2009 11:00 AM  Follow-up for Phone Call        Prescription sent to patient's home. Follow-up by: Awilda Bill CMA Deborra Medina),  December 27, 2009 11:27 AM    New/Updated Medications: FERROUS SULFATE 325 (65 FE) MG TABS (FERROUS SULFATE) Take as directed. Prescriptions: FERROUS SULFATE 325 (65 FE) MG TABS (FERROUS SULFATE) Take as directed.  #100 x 0   Entered by:   Awilda Bill CMA ([place])   Authorized by:   Lafayette Dragon MD   Signed by:   Awilda Bill CMA ([place]) on 12/27/2009   Method used:   Print then Mail to Patient   RxID:   [id number]

## 2010-11-25 NOTE — Letter (Signed)
Summary: Patient Notice- Colon Biospy Results  [facility]  [address] [place], [place] [address]   Phone: [phone]  Fax: [phone]        Feb 27, 2010 MRN: [id number]    Michelle Vasquez [address] [place],   [address]    Dear [name],  I am pleased to inform you that the biopsies taken during your recent colonoscopy did not show any evidence of cancer upon pathologic examination.There is no evidence of active Crohn's disease on any of the biopsies  Additional information/recommendations:  __No further action is needed at this time.  Please follow-up with      your primary care physician for your other healthcare needs.  __Please call [phone] to schedule a return visit to review      your condition.  _x_Continue with the treatment plan as outlined on the day of your      exam.  _x_You should have a repeat colonoscopy examination for this problem           in 5_ years.  I hope You are feeling better now, I think  what made You sick after the colonoscopy was  the air in Your colon and small bowlfrom the procedure . Please call us if you are having persistent problems or have questions about your condition that have not been fully answered at this time.  Sincerely,  Lafayette Dragon MD   This letter has been electronically signed by your physician.  Appended Document: Patient Notice- Colon Biospy Results letter mailed 5.6.11

## 2010-11-25 NOTE — Miscellaneous (Signed)
Summary: Ferrous Sulfate Rx  Clinical Lists Changes  Medications: Changed medication from FERROUS SULFATE 325 (65 FE) MG TABS (FERROUS SULFATE) Take as directed. to FERROUS SULFATE 325 (65 FE) MG TABS (FERROUS SULFATE) Take as directed. MUST HAVE OFFICE VISIT FOR FURTHER REFILLS! - Signed Rx of FERROUS SULFATE 325 (65 FE) MG TABS (FERROUS SULFATE) Take as directed. MUST HAVE OFFICE VISIT FOR FURTHER REFILLS!;  #60 x 0;  Signed;  Entered by: Madlyn Frankel CMA (AAMA);  Authorized by: Lafayette Dragon MD;  Method used: Electronically to [facility]*, [address] [place], [place]  [address], Ph: [phone], Fax: [phone]    Prescriptions: FERROUS SULFATE 325 (65 FE) MG TABS (FERROUS SULFATE) Take as directed. MUST HAVE OFFICE VISIT FOR FURTHER REFILLS!  #60 x 0   Entered by:   Madlyn Frankel CMA (AAMA)   Authorized by:   Lafayette Dragon MD   Signed by:   Madlyn Frankel CMA ([place]) on 08/15/2010   Method used:   Electronically to        [facility] (retail)       [address]       [place], [place]  [address]       Ph: [phone]       Fax: [phone]   RxID:   [id number]

## 2010-11-25 NOTE — Progress Notes (Signed)
Summary: Speak to nurse re: refill  Medications Added MERCAPTOPURINE 50 MG TABS (MERCAPTOPURINE) Take 1 tablet by mouth once a day.       Phone Note Call from Patient Call back at Home Phone [phone]   Call For: DR Ashlan Dignan Reason for Call: Refill Medication Summary of Call: Wants to talk to the nurse about a refill. Initial call taken by: Irwin Brakeman [facility],  Mar 25, 2010 10:54 AM  Follow-up for Phone Call        Patient would like refills on 11m. That has been sent. She would also like to know if you would like her to have repeat labs (her last labs were in 10/2009 and showed a decreased H & H). If so, what would you like drawn? Follow-up by: DMadlyn FrankelCMA (Deborra Medina,  Mar 25, 2010 11:03 AM  Additional Follow-up for Phone Call Additional follow up Details #1::        Yes, please, she needs labs: CBC,Fe,TIBC,sed.rRVIF,B37Additional Follow-up by: DLafayette DragonMD,  Mar 25, 2010 1:24 PM    Additional Follow-up for Phone Call Additional follow up Details #2::    I have entered orders into [facility]. I have left a message to advise patient. Follow-up by: DMadlyn FrankelCMA (AAMA),  Mar 25, 2010 1:50 PM  New/Updated Medications: MERCAPTOPURINE 50 MG TABS (MERCAPTOPURINE) Take 1 tablet by mouth once a day. Prescriptions: MERCAPTOPURINE 50 MG TABS (MERCAPTOPURINE) Take 1 tablet by mouth once a day.  #30 x 4   Entered by:   DMadlyn FrankelCMA ([place]   Authorized by:   DLafayette DragonMD   Signed by:   DMadlyn FrankelCMA ([place] on 03/25/2010   Method used:   Electronically to        [facility])       [address]      [place]  [address]      Ph: [phone]      Fax: [phone]  RxID:   [id number]

## 2010-11-25 NOTE — Letter (Signed)
Summary: Previsit letter  [facility]  [place], [place] [address]   Phone: [phone]  Fax: [phone]       01/27/2010 MRN: [id number]  [facility] [address] [place], [place]  [address]  Dear Ms. Michelle Vasquez,  Welcome to the Gastroenterology Division at [facility].    You are scheduled to see a nurse for your pre-procedure visit on 02/10/2010 at 4:30PM on the 3rd floor at [facility], [place] [employer].  We ask that you try to arrive at our office 15 minutes prior to your appointment time to allow for check-in.  Your nurse visit will consist of discussing your medical and surgical history, your immediate family medical history, and your medications.    Please bring a complete list of all your medications or, if you prefer, bring the medication bottles and we will list them.  We will need to be aware of both prescribed and over the counter drugs.  We will need to know exact dosage information as well.  If you are on blood thinners (Coumadin, Plavix, Aggrenox, Ticlid, etc.) please call our office today/prior to your appointment, as we need to consult with your physician about holding your medication.   Please be prepared to read and sign documents such as consent forms, a financial agreement, and acknowledgement forms.  If necessary, and with your consent, a friend or relative is welcome to sit-in on the nurse visit with you.  Please bring your insurance card so that we may make a copy of it.  If your insurance requires a referral to see a specialist, please bring your referral form from your primary care physician.  No co-pay is required for this nurse visit.     If you cannot keep your appointment, please call [phone] to cancel or reschedule prior to your appointment date.  This allows [place] the opportunity to schedule an appointment for another patient in need of care.    Thank you for choosing [facility] for your medical needs.   We appreciate the opportunity to care for you.  Please visit [place] at our website  to learn more about our practice.                     Sincerely.                                                                                                                   The Gastroenterology Division

## 2010-11-25 NOTE — Assessment & Plan Note (Signed)
Summary: ? KIDNEY STONE   Vital Signs:  Patient profile:   60 year old female Weight:      111.50 pounds Temp:     98.3 degrees F oral Pulse rate:   76 / minute Pulse rhythm:   regular BP sitting:   130 / 72  (left arm) Cuff size:   regular  Vitals Entered By: Maudie Mercury Dance CMA Deborra Medina) (August 19, 2010 2:12 PM) CC: ? kidney stone   History of Present Illness: CC: ? kidney stone  Seen late last week, dx with sinusitis, no UTI.  Treated with bactrim.  returns today because having L flank pain.  Feels like starting cycle (but s/p hysterectomy).  Intermittent pain, back ache L flank, then shooting pain down into groin.  Kept up last night.  Pain cramp.  Then feels urgency, pain with urination.  + chills this morning and nausea.  Decreased appetite.  Has not tried anything for pain.    Had kidney stone in past (30 years ago) but much more pain than currently.  trouble with NSAIDs 2/2 chrohn's.  Able to take tylenol.  some constipation recently (usually opposite)  last stool yesterday, passing gas well.  s/p complete hysterectomy and BSO  Allergies: No Known Drug Allergies  Past History:  Past Medical History: Last updated: 08/15/2010 HTN Chrohn's Disease s/p colectomy h/o SBO chronic diarrhea OSTEOMALACIA, UNSPECIFIED (ICD-268.2) Hx of MACROCYTIC ANEMIA (ICD-281.9) Hx of VITAMIN B12 DEFICIENCY (ICD-266.2) HEMORRHOIDS, INTERNAL (ICD-455.0) POLYARTHRITIS NOS, MULTIPLE SITES (ICD-716.59) MENOPAUSE, SURGICAL (ICD-627.4) LOSS, CENTRAL HEARING (ICD-389.14)    Social History: Reviewed history from 04/10/2009 and no changes required. Married  Divorced 1989   3children Occupation: Surveyor, mining Alcohol Use - no Illicit Drug Use - no Patient is a former smoker.  Daily Caffeine Use  Review of Systems       per HPI  Physical Exam  General:  NAD, tired Neck:  Supple; no masses or thyromegaly. Lungs:  Normal respiratory effort, chest expands symmetrically. Lungs are  clear to auscultation, no crackles or wheezes. Heart:  Normal rate and regular rhythm. S1 and S2 normal without gallop, murmur, click, rub or other extra sounds. Abdomen:  soft, ND, NABS, + tender to palpation bilat LQ, suprapubically.  + L CVA tenderness, no rebound/guarding Pulses:  2+ rad pulses Extremities:  no edema Skin:  Intact without suspicious lesions or rashes   Impression & Recommendations:  Problem # 1:  FLANK PAIN, LEFT (ICD-789.09) treat as possible kidney stone with flomax, vicodin.  pt cannot tolerate NSAID 2/2 chrohn's disease.  provided with strainer.  want pt to return in 2-3 wks for rpt UA to send micro to ensure hematuria resolved.  KUB without evidence of stone today.  Orders: T-1 View Abdomen (KUB) (74000TC)  Her updated medication list for this problem includes:    Vicodin 5-500 Mg Tabs (Hydrocodone-acetaminophen) ..... One every 4-6 hours as needed pain  Complete Medication List: 1)  Mercaptopurine 50 Mg Tabs (Mercaptopurine) .... Take 1 tablet by mouth once a day. 2)  Amlodipine Besylate 5 Mg Tabs (Amlodipine besylate) .... One tab by mouth once daily 3)  Ferrous Sulfate 325 (65 Fe) Mg Tabs (Ferrous sulfate) .... Take as directed. must have office visit for further refills! 4)  Cyanocobalamin 1000 Mcg/ml Soln (Cyanocobalamin) .... Once a month at work 5)  Bactrim Ds 800-160 Mg Tabs (Sulfamethoxazole-trimethoprim) .... One tablet two times a day x 10 days 6)  Flomax 0.4 Mg Caps (Tamsulosin hcl) .... Take one daily for 7  days for kidney stone 7)  Vicodin 5-500 Mg Tabs (Hydrocodone-acetaminophen) .... One every 4-6 hours as needed pain  Patient Instructions: 1)  treat as possible kidney stone with flomax daily for 7 days, strain urine, and vicodin for breakthrough pain. 2)  Please let us know how you are doing with these measures.  3)  Drink plenty of fluid.   4)  KUB didn't show any evident stone to me, but awaiting radiologist read.  If any chnage in plan, we  will call you at [phone] Prescriptions: VICODIN 5-500 MG TABS (HYDROCODONE-ACETAMINOPHEN) one every 4-6 hours as needed pain  #30 x 0   Entered and Authorized by:   [name]  MD   Signed by:   [name]  MD on 08/19/2010   Method used:   Print then Give to Patient   RxID:   [id number] FLOMAX 0.4 MG CAPS (TAMSULOSIN HCL) take one daily for 7 days for kidney stone  #7 x 0   Entered and Authorized by:   [name]  MD   Signed by:   [name]  MD on 08/19/2010   Method used:   Electronically to        [facility] (retail)       [address]       [place], [place]  [address]       Ph: [phone]       Fax: [phone]   [place]:   [phone]    Orders Added: 1)  T-1 View Abdomen (KUB) [74000TC] 2)  Est. Patient Level III [78938]    Current Allergies (reviewed today): No known allergies   Laboratory Results   Urine Tests  Date/Time Received: August 19, 2010  Date/Time Reported: August 19, 2010   Routine Urinalysis   Color: yellow Appearance: Clear Glucose: negative   (Normal Range: Negative) Bilirubin: negative   (Normal Range: Negative) Ketone: negative   (Normal Range: Negative) Spec. Gravity: 1.020   (Normal Range: 1.003-1.035) Blood: moderate   (Normal Range: Negative) pH: 5.0   (Normal Range: 5.0-8.0) Protein: negative   (Normal Range: Negative) Urobilinogen: 0.2   (Normal Range: 0-1) Nitrite: negative   (Normal Range: Negative) Leukocyte Esterace: negative   (Normal Range: Negative)  Urine Microscopic WBC/HPF: rare RBC/HPF: 0-3 Bacteria/HPF: 2+  Epithelial/HPF: rare    Comments: read by .........................[name]  MD  August 19, 2010 2:37 PM

## 2010-11-25 NOTE — Progress Notes (Signed)
Summary: Triage-Labs   Phone Note Call from Patient Call back at Home Phone [phone]   Caller: Patient Call For: Dr. Olevia Perches Reason for Call: Talk to Nurse Summary of Call: Pt is wanting to know if she needs to schedule an appt. to come in for bloodwork Initial call taken by: Webb Laws,  October 29, 2009 9:13 AM  Follow-up for Phone Call        DR.Wilmot Quevedo--Pt. has Crohn's, states she is supposed to have labs every 6 monthes. What labs do you want drawn? Follow-up by: Vivia Ewing LPN,  October 29, 5186 9:19 AM  Additional Follow-up for Phone Call Additional follow up Details #1::        last comprehesive labs were in June 2010 Please draw CBC,C-met, B12, sed.rate.,Fe,TIBC recall colon 02/2010, will plan to draw next set of blood tests at that time Additional Follow-up by: Lafayette Dragon MD,  October 29, 2009 12:57 PM    Additional Follow-up for Phone Call Additional follow up Details #2::    Above MD orders reviewed with patient. Lab orders are in [facility] for now and in 02/2010. Pt. will callback to schedule Colonoscopy. Pt. instructed to call back as needed.  Follow-up by: Vivia Ewing LPN,  October 29, 4164 1:01 PM

## 2010-11-25 NOTE — Assessment & Plan Note (Signed)
Summary: BLOOD IN URINE/CHILLS/NAUSEA/SINUS/DLO   Vital Signs:  Patient profile:   60 year old female Weight:      109 pounds Temp:     98.3 degrees F oral Pulse rate:   84 / minute Pulse rhythm:   regular BP sitting:   122 / 70  (left arm) Cuff size:   regular  Vitals Entered By: Maudie Mercury Dance CMA Deborra Medina) (August 15, 2010 8:05 AM) CC: ? UTI/Sinus infection   History of Present Illness: CC: ?UTI/sinus  3wk-69moh/o sinus pressure HA, eyes hurting, R sided tinnitus and ear popping, forehead swelling.  + hoarseness x 3 mo.  has taken sinus meds, not helping.  + fever to 99 and had chill Sunday.  No coughing.  Has tried pseudophed and benadryl.  Took tylenol as well, helped some.  No cough, no RN, no mucous production.  Also noted blood in urine 3days ago.  None since.  + back ache R flank.  Achey throbby, no shooting pain down groin.  No dysuria.  + pressure with voiding.  + urgency.  Decreased amount of urination.  No abd pain.  + h/o chrohn's disease.    No smokers at home.  + somnolence.    Current Medications (verified): 1)  Mercaptopurine 50 Mg Tabs (Mercaptopurine) .... Take 1 Tablet By Mouth Once A Day. 2)  Amlodipine Besylate 5 Mg Tabs (Amlodipine Besylate) .... One Tab By Mouth Once Daily 3)  Ferrous Sulfate 325 (65 Fe) Mg Tabs (Ferrous Sulfate) .... Take As Directed. 4)  Cyanocobalamin 1000 Mcg/ml Soln (Cyanocobalamin) .... 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 sinuses NT Eyes:  Conjunctiva clear bilaterally.  Ears:  External ear exam shows no significant lesions or deformities.  Otoscopic examination reveals clear canal on the right, occluded on the left, tympanic membrane  intact on the right without bulging, retraction, inflammation or discharge. Hearing is grossly normal on the right. All on the left is abnml with cerumen imopaction deep in the canal up against the TM. Nose:  External nasal examination shows no deformity or inflammation. Nasal mucosa are pink and moist without lesions or exudates. Mouth:  Oral mucosa and oropharynx without lesions or exudates.  Teeth in good repair. No apthous ulcers. Neck:  Supple; no masses or thyromegaly. Lungs:  Normal respiratory effort, chest expands symmetrically. Lungs are clear to auscultation, no crackles or wheezes. Heart:  Normal rate and regular rhythm. S1 and S2 normal without gallop, murmur, click, rub or other extra sounds. Abdomen:  soft abdomen with normal active bowel sounds. Well-healed surgical scar in midline. No fullness. Liver edge at costal margin.  + suprapubic pressure.  + mild R CVA tenderness Pulses:  2+ rad pulses Extremities:  no edema   Impression & Recommendations:  Problem # 1:  ? of UTI (ICD-599.0)  UCx sent.  UA/micro not suspicious for infection.  UA with large blood but micro with only 0-3 RBCs (WNL).  regardless, return in 2-3 wks for rpt UA.  Her updated medication list for this problem includes:    Bactrim Ds 800-160 Mg Tabs (Sulfamethoxazole-trimethoprim) ..... One tablet two times a day x 10 days  Orders: UA Dipstick W/ Micro (manual) (81000) Specimen Handling (99000) T-Culture, Urine  (67591-63846)  Problem # 2:  HEADACHE, SINUS (ICD-784.0)  some typical sxs sinusitis (sinus pressure HA) some not typical of sinusitis.  treat for presumed acute sinusitis (going on for 3 wks now).  Return in 2-3 wks for f/u, sooner if worsening.  (called and changed from cipro to bactrim - for better sinusitis coverage)  Complete Medication List: 1)  Mercaptopurine 50 Mg Tabs (Mercaptopurine) .... Take 1 tablet by mouth once a day. 2)  Amlodipine Besylate 5 Mg Tabs (Amlodipine besylate) .... One tab by mouth once daily 3)  Ferrous Sulfate 325 (65 Fe) Mg Tabs (Ferrous sulfate) .... Take as directed. 4)  Cyanocobalamin 1000 Mcg/ml Soln (Cyanocobalamin) .... Once a month at work 5)  Bactrim Ds 800-160 Mg Tabs (Sulfamethoxazole-trimethoprim) .... One tablet two times a day x 10 days  Patient Instructions: 1)  Return in 3 weeks for follow up 2)  Urine looking overall normal. 3)  This could be a sinus infection. 4)  Take medicines as prescribed: Cipro 579m twice daily for 10 days 5)  Use nasal saline spray to help dryness of sinuses. 6)  If you start having fevers >101.5, trouble swallowing or breathing, or are worsening instead of improving as expected, you may need to be seen again. 7)  Good to see you today, call clinic with questions.  Prescriptions: BACTRIM DS 800-160 MG TABS (SULFAMETHOXAZOLE-TRIMETHOPRIM) one tablet two times a day x 10 days  #20 x 0   Entered and Authorized by:   JRia Bush MD   Signed by:   JRia Bush MD on 08/15/2010   Method used:   Telephoned to ...       [facility]* (retail)       [address]      [place]  [address]      Ph: [phone]      Fax: [phone]  RxID:   [id number] HCL 500 MG TABS (CIPROFLOXACIN HCL) take one twice daily for 10 days  #20 x 0   Entered and Authorized by:   JRia Bush MD   Signed by:   JRia Bush MD on 08/15/2010   Method used:   Electronically to        [facility](retail)       [address]      [place]  [address]      Ph: [phone]      Fax: [phone]  RxID:   [id number]   Orders Added: 1)  UA Dipstick W/ Micro (manual) [81000] 2)  Est. Patient Level IV [[48250]3)  Specimen Handling [[03704]4)  T-Culture, Urine [[[id number]]   Current Allergies (reviewed today): No known allergies  Laboratory Results   Urine Tests  Date/Time Received: August 15, 2010 8:10 AM Date/Time Reported: August 15, 2010 8:10 AM   Routine Urinalysis   Color: yellow Appearance: Hazy Glucose: negative   (Normal Range: Negative) Bilirubin: negative   (Normal Range: Negative) Ketone: negative   (Normal Range: Negative) Spec. Gravity: 1.025   (  Normal Range: 1.003-1.035) Blood: large   (Normal Range: Negative) pH: 5.0   (Normal Range: 5.0-8.0) Protein: trace   (Normal Range: Negative) Urobilinogen: 0.2   (Normal Range: 0-1) Nitrite: negative   (Normal Range: Negative) Leukocyte Esterace: trace   (Normal Range: Negative)  Urine Microscopic WBC/HPF: rare RBC/HPF: 0-3 Bacteria/HPF: tr Mucous/HPF: 0 Epithelial/HPF: rare Crystals/HPF: 0 Casts/LPF: 0 Yeast/HPF: 0    Comments: read by .................[name]  MD  August 15, 2010 10:24 AM  UCx sent.

## 2010-11-25 NOTE — Procedures (Signed)
Summary: Colonoscopy  Patient: Michelle Vasquez Note: All result statuses are Final unless otherwise noted.  Tests: (1) Colonoscopy (COL)   COL Colonoscopy           [facility]     [place], [place]  [id number]           COLONOSCOPY PROCEDURE REPORT           PATIENT:  Michelle Vasquez, Michelle Vasquez  MR#:  [id number]     BIRTHDATE:  11-Oct-1951, 18 yrs. old  GENDER:  female     ENDOSCOPIST:  [name]. Olevia Perches, MD     REF. BY:     PROCEDURE DATE:  02/26/2010     PROCEDURE:  Colonoscopy 19147     ASA CLASS:  Class I     INDICATIONS:  Crohn's disease of TI, s/p TI resection 1981, s/p     SBOO 2008, chronic diarrhea,     last colon 2006 showed narrow anastomosis     MEDICATIONS:   Versed 7 mg, Fentanyl 75 mcg           DESCRIPTION OF PROCEDURE:   After the risks benefits and     alternatives of the procedure were thoroughly explained, informed     consent was obtained.  Digital rectal exam was performed and     revealed no rectal masses.   The LB [id number] endoscope     was introduced through the anus and advanced to the anastamosis,     without limitations.  The quality of the prep was good, using     MiraLax.  The instrument was then slowly withdrawn as the colon     was fully examined.     <<PROCEDUREIMAGES>>           FINDINGS:  The right colon was surgically resected and an     ileo-colonic anastamosis was seen. 50% narrowed anastomosis,     unable to advance through, no ulcerations, mild edema surrounding     the anastomosis Multiple biopsies were obtained and sent to     pathology (see image1, image2, image3, and image4).  This was     otherwise a normal examination of the colon (see image5). Bx's of     the TI and random biopsies of the colon to r/o granulomas     Retroflexed views in the rectum revealed no abnormalities.    The     scope was then withdrawn from the patient and the procedure     completed.           COMPLICATIONS:  None  ENDOSCOPIC IMPRESSION:     1) Prior right hemi-colectomy     2) Otherwise normal examination     50% narrowing of the ieocolic anastomosis, no active Crohn's     disease, s/p biopsies from TI, Anastomosis and random colon     biopsies     RECOMMENDATIONS:     1) Await pathology results     continue 6MP 50 mg po qd     REPEAT EXAM:  In 5 year(s) for.           ______________________________     [name]. Olevia Perches, MD           CC:  Modesto Charon, MD           n.     Lorrin MaisLowella Bandy. Kevonna Nolte at 02/26/2010 09:14 AM  Kylin, Genna, [phone]  Note: An exclamation mark (!) indicates a result that was not dispersed into the flowsheet. Document Creation Date: 02/26/2010 9:14 AM _______________________________________________________________________  (1) Order result status: Final Collection or observation date-time: 02/26/2010 09:03 Requested date-time:  Receipt date-time:  Reported date-time:  Referring Physician:   Ordering Physician: [name] [phone]) Specimen Source:  Source: Tawanna Cooler Order Number: [id number] Lab site:   Appended Document: Colonoscopy     Procedures Next Due Date:    Colonoscopy: 02/2015  Appended Document: Colonoscopy     Clinical Lists Changes  Observations: Added new observation of PAST SURG HX: HYSTERECTOMY:1975 Appendectomy Ileal resection x 3 and subtotal colon resection COLONOSCOPY INT. HEMMS: CROHNS: NARROW ANAST. 02/25/2005 EGD: DILATED: 02/25/2005 DEXA NORMAL: 01/29/2006  Colonoscopy Prior R Hemicolectomy o/w B9 (Dr Olevia Perches) 02/26/2010 B 12 INJECTIONS EVERY MONTH  (03/08/2010 21:31)       Past Surgical History:    HYSTERECTOMY:1975    Appendectomy    Ileal resection x 3 and subtotal colon resection    COLONOSCOPY INT. HEMMS: CROHNS: NARROW ANAST. 02/25/2005    EGD: DILATED: 02/25/2005    DEXA NORMAL: 01/29/2006     Colonoscopy Prior R Hemicolectomy o/w B9 (Dr Olevia Perches) 02/26/2010    B 12 INJECTIONS EVERY MONTH    Appended Document: Colonoscopy     Clinical Lists Changes  Observations: Added new observation of PAST SURG HX: HYSTERECTOMY:1975 Appendectomy Ileal resection x 3 and subtotal colon resection COLONOSCOPY INT. HEMMS: CROHNS: NARROW ANAST. 02/25/2005 EGD: DILATED: 02/25/2005 DEXA NORMAL: 01/29/2006  Colonoscopy Prior R Hemicolectomy o/w B9 (Dr Olevia Perches) 02/26/2010            5 yrs B 12 INJECTIONS EVERY MONTH  (03/08/2010 21:36)       Past Surgical History:    HYSTERECTOMY:1975    Appendectomy    Ileal resection x 3 and subtotal colon resection    COLONOSCOPY INT. HEMMS: CROHNS: NARROW ANAST. 02/25/2005    EGD: DILATED: 02/25/2005    DEXA NORMAL: 01/29/2006     Colonoscopy Prior R Hemicolectomy o/w B9 (Dr Olevia Perches) 02/26/2010            5 yrs    B 12 INJECTIONS EVERY MONTH

## 2010-11-25 NOTE — Miscellaneous (Signed)
Summary: previsit/rm  Clinical Lists Changes  Medications: Added new medication of MIRALAX   POWD (POLYETHYLENE GLYCOL 3350) As per prep  instructions. - Signed Added new medication of DULCOLAX 5 MG  TBEC (BISACODYL) Day before procedure take 2 at 3pm and 2 at 8pm. - Signed Added new medication of REGLAN 10 MG  TABS (METOCLOPRAMIDE HCL) As per prep instructions. - Signed Rx of MIRALAX   POWD (POLYETHYLENE GLYCOL 3350) As per prep  instructions.;  #255gm x 0;  Signed;  Entered by: [name] RN;  Authorized by: Lafayette Dragon MD;  Method used: Electronically to [facility]*, [address], [place], [place]  [address], Ph: [phone], Fax: [phone] Rx of DULCOLAX 5 MG  TBEC (BISACODYL) Day before procedure take 2 at 3pm and 2 at 8pm.;  #4 x 0;  Signed;  Entered by: [name] RN;  Authorized by: Lafayette Dragon MD;  Method used: Electronically to [facility]*, [address] [place], [place]  [address], Ph: [phone], Fax: [phone] Rx of REGLAN 10 MG  TABS (METOCLOPRAMIDE HCL) As per prep instructions.;  #2 x 0;  Signed;  Entered by: [name] RN;  Authorized by: Lafayette Dragon MD;  Method used: Electronically to [facility]*, [address] [place], [place]  [address], Ph: [phone], Fax: [phone] Observations: Added new observation of ALLERGY REV: Done (02/10/2010 16:21)    Prescriptions: REGLAN 10 MG  TABS (METOCLOPRAMIDE HCL) As per prep instructions.  #2 x 0   Entered by:   [name] RN   Authorized by:   Lafayette Dragon MD   Signed by:   [name] RN on 02/10/2010   Method used:   Electronically to        [facility] (retail)       [address]       [place], [place]  [address]       Ph: [phone]       Fax: [phone]   RxID:   [id number] DULCOLAX 5 MG  TBEC (BISACODYL) Day before procedure take 2 at 3pm and 2 at 8pm.  #4 x 0   Entered by:   [name] RN   Authorized by:   Lafayette Dragon MD   Signed by:   [name] RN on 02/10/2010  Method used:   Electronically to        [facility] (retail)       [address]       [place], [place]  [address]       Ph: [phone]       Fax: [phone]   RxID:   [id number] [place]   POWD (POLYETHYLENE GLYCOL 3350) As per prep  instructions.  #255gm x 0   Entered by:   [name] RN   Authorized by:   Lafayette Dragon MD   Signed by:   [name] RN on 02/10/2010   Method used:   Electronically to        [facility] (retail)       [address]       [place],   [address]       Ph: [phone]       Fax: [phone]   [place]:   [phone]

## 2010-11-25 NOTE — Miscellaneous (Signed)
Summary: 6 mp refill  Clinical Lists Changes  Medications: Rx of MERCAPTOPURINE 50 MG TABS (MERCAPTOPURINE) Take 1 tablet by mouth once a day.;  #30 x 1;  Signed;  Entered by: Awilda Bill CMA (AAMA);  Authorized by: Lafayette Dragon MD;  Method used: Electronically to [facility]*, [address], [place], [place]  [address], Ph: [phone], Fax: [phone]    Prescriptions: MERCAPTOPURINE 50 MG TABS (MERCAPTOPURINE) Take 1 tablet by mouth once a day.  #30 x 1   Entered by:   Awilda Bill CMA ([place])   Authorized by:   Lafayette Dragon MD   Signed by:   Awilda Bill CMA ([place]) on 01/02/2010   Method used:   Electronically to        [facility] (retail)       [address]       [place], [place]  [address]       Ph: [phone]       Fax: [phone]   RxID:   [id number]

## 2010-11-25 NOTE — Letter (Signed)
Summary: [name] letter  [place] [facility]  [address] [place], [place] [address]   Phone: [phone]  Fax: [phone]       05/29/2010 MRN: [id number]  Michelle Vasquez [address] [place], [place]  [address]  Dear [name],  [name], and [place] announce the retirement of Modesto Charon, M.D., from full-time practice at the [facility] office effective April 24, 2010 and his plans of returning part-time.  It is important to Dr. Council Mechanic and to our practice that you understand that [facility] has seven physicians in our office for your health care needs.  We will continue to offer the same exceptional care that you have today.    Dr. Council Mechanic has spoken to many of you about his plans for retirement and returning part-time in the fall.   We will continue to work with you through the transition to schedule appointments for you in the office and meet the high standards that [facility] is committed to.   Again, it is with great pleasure that we share the news that Dr. Council Mechanic will return to [facility] in October of 2011 with a reduced schedule.    If you have any questions, or would like to request an appointment with one of our physicians, please call us at [phone] and press the option for Scheduling an appointment.  We take pleasure in providing you with excellent patient care and look forward to seeing you at your next office visit.  [facility] [facility] are:  Viviana Simpler, M.D. Teresa Pelton, M.D. Loura Pardon, M.D. Eliezer Lofts, M.D. Owens Loffler, M.D. Arnette Norris, M.D. We proudly welcomed Renford Dills, M.D. and [name], M.D. to the practice in July/August 2011.  Sincerely,  [facility]

## 2010-11-25 NOTE — Miscellaneous (Signed)
Summary: 70m refills  Clinical Lists Changes  Medications: Changed medication from MERCAPTOPURINE 50 MG TABS (MERCAPTOPURINE) Take 1 tablet by mouth once a day. to MERCAPTOPURINE 50 MG TABS (MERCAPTOPURINE) Take 1 tablet by mouth once a day. MUST HAVE OFFICE VISIT FOR FURTHER REFILLS! - Signed Rx of MERCAPTOPURINE 50 MG TABS (MERCAPTOPURINE) Take 1 tablet by mouth once a day. MUST HAVE OFFICE VISIT FOR FURTHER REFILLS!;  #30 x 0;  Signed;  Entered by: DMadlyn FrankelCMA (AAMA);  Authorized by: DLafayette DragonMD;  Method [facility], [address] [place]  [address] Ph: [phone] Fax: [phone]   Prescriptions: MERCAPTOPURINE 50 MG TABS (MERCAPTOPURINE) Take 1 tablet by mouth once a day. MUST HAVE OFFICE VISIT FOR FURTHER REFILLS!  #30 x 0   Entered by:   DMadlyn FrankelCMA (AAMA)   Authorized by:   DLafayette DragonMD   Signed by:   DMadlyn FrankelCMA ([place] on 02/20/2010   Method used:   Electronically to        [facility](retail)       [address]      [place]  [address]      Ph: [phone]      Fax: [phone]  RxID::   [id number]

## 2010-11-25 NOTE — Miscellaneous (Signed)
Summary: rx cipro  Clinical Lists Changes  Medications: Added new medication of CIPROFLOXACIN HCL 250 MG  TABS (CIPROFLOXACIN HCL) Take 1 twice a day times 7 days - Signed Rx of CIPROFLOXACIN HCL 250 MG  TABS (CIPROFLOXACIN HCL) Take 1 twice a day times 7 days;  #14 x 0;  Signed;  Entered by: [name] RN;  Authorized by: Lafayette Dragon MD;  Method used: Electronically to [facility]*, [address] [place], [place]  [address], Ph: [phone], Fax: [phone] Observations: Added new observation of ALLERGY REV: Done (02/27/2010 9:20)    Prescriptions: CIPROFLOXACIN HCL 250 MG  TABS (CIPROFLOXACIN HCL) Take 1 twice a day times 7 days  #14 x 0   Entered by:   [name] RN   Authorized by:   Lafayette Dragon MD   Signed by:   [name] RN on 02/27/2010   Method used:   Electronically to        [facility] (retail)       [address]       [place], [place]  [address]       Ph: [phone]       Fax: [phone]   RxID:   [id number]

## 2010-11-25 NOTE — Assessment & Plan Note (Signed)
Summary: CPX/CLE   Vital Signs:  Patient profile:   59 year old female Height:      60.75 inches Weight:      109 pounds BMI:     20.84 Temp:     98.4 degrees F oral Pulse rate:   64 / minute Pulse rhythm:   regular BP sitting:   124 / 68  (left arm) Cuff size:   regular  Vitals Entered By: Emelia Salisbury LPN (April 15, 1016 51:02 AM) CC: 30 minute checkup, has had a hysterectomy, had a colonoscopy 05/11 by Dr. Olevia Perches   History of Present Illness: Pt here for Comp Exam, she has mild Gi upset going on with diarrhea. This is not terribly unusual for her. She has somre left ear changes in hearing with pitch changes and some dizziness. She is always tired.  Preventive Screening-Counseling & Management  Alcohol-Tobacco     Alcohol drinks/day: 0     Smoking Status: quit     Packs/Day: 1/2     Year Started: 1969     Year Quit: 1979     Pack years: 5  Caffeine-Diet-Exercise     Caffeine use/day: 2     Does Patient Exercise: no  Problems Prior to Update: 1)  Health Maintenance Exam  (ICD-V70.0) 2)  Small Bowel Obstr, Hx of Recurr Last 4/99  (ICD-V12.79) 3)  Osteomalacia, Unspecified  (ICD-268.2) 4)  Essential Hypertension  (ICD-401.9) 5)  Diarrhea, Chronic  (ICD-787.91) 6)  Hx of Vitamin B12 Deficiency  (ICD-266.2) 7)  Hemorrhoids, Internal  (ICD-455.0) 8)  Shoulder Pain  (ICD-719.41) 9)  Polyarthritis Nos, Multiple Sites  (ICD-716.59) 10)  Colectomy, Partial, With Anastomosis, Hx of  (ICD-V15.2) 11)  Crohn's Disease  (ICD-555.9) 12)  Menopause, Surgical  (ICD-627.4) 13)  Loss, Central Hearing  (ICD-389.14)  Medications Prior to Update: 1)  Vitamin B-12 Cr 1000 Mcg  Tbcr (Cyanocobalamin) .... Inject 1 Ml Intramuscularly Once Per Month. Pharmacy-Please Include 1 Box of 25 Gauge 5/8' Needles and Syringes! 2)  Flexeril 10 Mg Tabs (Cyclobenzaprine Hcl) .... One Tab By Mouth Three Times A Day As Able 3)  Mercaptopurine 50 Mg Tabs (Mercaptopurine) .... Take 1 Tablet By Mouth Once  A Day. 4)  Amlodipine Besylate 5 Mg Tabs (Amlodipine Besylate) .... One Tab By Mouth Once Daily 5)  Ferrous Sulfate 325 (65 Fe) Mg Tabs (Ferrous Sulfate) .... Take As Directed. 6)  Ciprofloxacin Hcl 250 Mg  Tabs (Ciprofloxacin Hcl) .... Take 1 Twice A Day Times 7 Days  Allergies: No Known Drug Allergies  Past History:  Past Medical History: Last updated: 02/26/2009 Current Problems:  SMALL BOWEL OBSTRUCTION, HX OF (ICD-V12.79) OSTEOMALACIA, UNSPECIFIED (ICD-268.2) ESSENTIAL HYPERTENSION (ICD-401.9) MUSCLE SPASM, TRAPEZIUS MUSCLE, LEFT (ICD-728.85) Hx of SMALL BOWEL OBSTRUCTION (ICD-560.9) DIARRHEA, CHRONIC (ICD-787.91) Hx of MACROCYTIC ANEMIA (ICD-281.9) Hx of VITAMIN B12 DEFICIENCY (ICD-266.2) HEMORRHOIDS, INTERNAL (ICD-455.0) SHOULDER PAIN (ICD-719.41) POLYARTHRITIS NOS, MULTIPLE SITES (ICD-716.59) COLECTOMY, PARTIAL, WITH ANASTOMOSIS, HX OF (ICD-V15.2) CROHN'S DISEASE (ICD-555.9) MENOPAUSE, SURGICAL (ICD-627.4) LOSS, CENTRAL HEARING (ICD-389.14)        Past Surgical History: Last updated: 03/08/2010 HYSTERECTOMY:1975 Appendectomy Ileal resection x 3 and subtotal colon resection COLONOSCOPY INT. HEMMS: CROHNS: NARROW ANAST. 02/25/2005 EGD: DILATED: 02/25/2005 DEXA NORMAL: 01/29/2006  Colonoscopy Prior R Hemicolectomy o/w B9 (Dr Olevia Perches) 02/26/2010            5 yrs B 12 INJECTIONS EVERY MONTH   Family History: Last updated: Apr 20, 2010 Father DIED  21 COMPLICATIONS OF COLOSTOMY/ CROHNS DZ. COLON:  Mother:DIED 80 YOA.  HEART PROBLEMS HTN/  ARTERIOSCLEROSIS  BROTHER dec 9/10 from Cirrhosis  ETOH --CIRRHOSIS 1 YEAR TO LIVE 04/07 SISTER A 65 (LINDA) SISTER A 67 (RUTH) SISTER A 51 (TAMMY) Crohn's Family History of Colitis/Crohn's: Father, 3 aunts, 1 sister, 2 neices No FH of Colon Cancer:  Social History: Last updated: 04/10/2009 Married  Divorced 1989   3children Occupation: Surveyor, mining Alcohol Use - no Illicit Drug Use - no Patient is a former  smoker.  Daily Caffeine Use  Risk Factors: Alcohol Use: 0 (04/15/2010) Caffeine Use: 2 (04/15/2010) Exercise: no (04/15/2010)  Risk Factors: Smoking Status: quit (04/15/2010) Packs/Day: 1/2 (04/15/2010)  Family History: Father DIED  59 COMPLICATIONS OF COLOSTOMY/ CROHNS DZ. COLON:  Mother:DIED 80 YOA. HEART PROBLEMS HTN/  ARTERIOSCLEROSIS  BROTHER dec 9/10 from Cirrhosis  ETOH --CIRRHOSIS 1 YEAR TO LIVE 04/07 SISTER A 70 (LINDA) SISTER A 67 (RUTH) SISTER A 51 (TAMMY) Crohn's Family History of Colitis/Crohn's: Father, 3 aunts, 1 sister, 2 neices No FH of Colon Cancer:  Review of Systems General:  Complains of fatigue; denies chills, fever, sweats, weakness, and weight loss; occas. Eyes:  Denies blurring, discharge, and eye pain. ENT:  Complains of decreased hearing, earache, and ringing in ears; left, fairly constant.. CV:  Complains of fatigue; denies chest pain or discomfort, difficulty breathing at night, fainting, palpitations, shortness of breath with exertion, swelling of feet, and swelling of hands; see HPI. Resp:  Denies cough, shortness of breath, and wheezing. GI:  Complains of diarrhea; denies abdominal pain, bloody stools, change in bowel habits, constipation, dark tarry stools, indigestion, loss of appetite, nausea, vomiting, vomiting blood, and yellowish skin color. GU:  Denies discharge, dysuria, nocturia, and urinary frequency. MS:  Complains of joint pain and cramps; denies low back pain, muscle aches, muscle weakness, and stiffness. Derm:  Denies dryness, itching, and rash. Neuro:  Denies numbness, poor balance, tingling, and tremors.  Physical Exam  General:  Well-developed,well-nourished,in no acute distress; alert,appropriate and cooperative throughout examination, healthy appearing. Head:  Normocephalic and atraumatic without obvious abnormalities. Isnuses NT.Marland Kitchen Eyes:  Conjunctiva clear bilaterally.  Ears:  External ear exam shows no significant lesions or  deformities.  Otoscopic examination reveals clear canal on the right, occluded on the left, tympanic membrane  intact on the right without bulging, retraction, inflammation or discharge. Hearing is grossly normal on the right. All on the left is abnml with cerumen imopaction deep in the canal up against the TM. Nose:  External nasal examination shows no deformity or inflammation. Nasal mucosa are pink and moist without lesions or exudates. Mouth:  Oral mucosa and oropharynx without lesions or exudates.  Teeth in good repair. No apthous ulcers. Neck:  Supple; no masses or thyromegaly. Chest Wall:  No deformities, masses, or tenderness noted. Breasts:  No mass, nodules, thickening, tenderness, bulging, retraction, inflamation, nipple discharge or skin changes noted.   Lungs:  Normal respiratory effort, chest expands symmetrically. Lungs are clear to auscultation, no crackles or wheezes. Heart:  Normal rate and regular rhythm. S1 and S2 normal without gallop, murmur, click, rub or other extra sounds. Abdomen:  soft abdomen with normal active bowel sounds. No tenderness. Well-healed surgical scar in midline. No fullness. Liver edge at costal margin. Rectal:  No external abnormalities noted. Normal sphincter tone. No rectal masses or tenderness. G neg. Genitalia:  Bimanual only done Introitus wnl, Uterus and Cervix absent, Adnexa nontender w/o mass, ovaries not felt.  Msk:  No deformity or scoliosis noted of thoracic or lumbar spine.  Pulses:  R and L carotid,radial,femoral,dorsalis pedis and posterior tibial pulses are full and equal bilaterally Extremities:  No clubbing, cyanosis, edema, or deformity noted with normal full range of motion of all joints.   Neurologic:  No cranial nerve deficits noted. Station and gait are normal. Sensory, motor and coordinative functions appear intact. Skin:  Intact without suspicious lesions or rashes Cervical Nodes:  No lymphadenopathy noted Axillary Nodes:  No  palpable lymphadenopathy Inguinal Nodes:  No significant adenopathy Psych:  Cognition and judgment appear intact. Alert and cooperative with normal attention span and concentration. No apparent delusions, illusions, hallucinations   Impression & Recommendations:  Problem # 1:  Pisek (ICD-V70.0) Discussed maintenance. No Pap from now on per guidelines.  Problem # 2:  ESSENTIAL HYPERTENSION (ICD-401.9) Assessment: Unchanged Stable. Her updated medication list for this problem includes:    Amlodipine Besylate 5 Mg Tabs (Amlodipine besylate) ..... One tab by mouth once daily  BP today: 124/68 Prior BP: 118/60 (04/10/2009)  Labs Reviewed: K+: 4.1 (04/09/2010) Creat: : 0.8 (04/09/2010)   Chol: 182 (04/09/2010)   HDL: 76.20 (04/09/2010)   LDL: 97 (04/09/2010)   TG: 46.0 (04/09/2010)  Problem # 3:  DIARRHEA, CHRONIC (ICD-787.91) Assessment: Unchanged Stable. With iron def anemia, add iron and should help diarrhea.  Problem # 4:  Hx of VITAMIN B12 DEFICIENCY (ICD-266.2) Assessment: Unchanged B12 stable. Cont oral B12.  Problem # 5:  HEMORRHOIDS, INTERNAL (ICD-455.0) Assessment: Unchanged Stable.  Problem # 6:  CROHN'S DISEASE (ICD-555.9) Assessment: Unchanged Just seen and eval'd by Dr Olevia Perches.  Problem # 7:  CERUMEN IMPACTION, LEFT (ICD-380.4) Assessment: New  Irrigated and curretted clear. Canal irritated behind but does not look infected. TM nml and hearing normalized after being cleaned. Encouraged to stop Qtips and start regular irrigation.  Orders: Cerumen Impaction Removal (74128)  Problem # 8:  ANEMIA, IRON DEFICIENCY (ICD-280.9) Assessment: New  Start OTC iron replacement slowly and work up to three times a day for 3-4 mos then cont two times a day reguilarly thereafter. Her updated medication list for this problem includes:    Vitamin B-12 Cr 1000 Mcg Tbcr (Cyanocobalamin) ..... Inject 1 ml intramuscularly once per month. pharmacy-please include  1 box of 25 gauge 5/8' needles and syringes!    Ferrous Sulfate 325 (65 Fe) Mg Tabs (Ferrous sulfate) .Marland Kitchen... Take as directed.  Hgb: 13.5 (04/09/2010)   Hct: 39.3 (04/09/2010)   Platelets: 263.0 (04/09/2010) RBC: 3.90 (04/09/2010)   RDW: 14.7 (04/09/2010)   WBC: 5.4 (04/09/2010) MCV: 100.9 (04/09/2010)   MCHC: 34.4 (04/09/2010) Iron: 100 (04/09/2010)   % Sat: 14.5 (03/27/2010) B12: 346 (04/09/2010)   Folate: 15.6 (10/04/2008)   TSH: 3.29 (04/09/2010)  Complete Medication List: 1)  Vitamin B-12 Cr 1000 Mcg Tbcr (Cyanocobalamin) .... Inject 1 ml intramuscularly once per month. pharmacy-please include 1 box of 25 gauge 5/8' needles and syringes! 2)  Flexeril 10 Mg Tabs (Cyclobenzaprine hcl) .... One tab by mouth three times a day as able 3)  Mercaptopurine 50 Mg Tabs (Mercaptopurine) .... Take 1 tablet by mouth once a day. 4)  Amlodipine Besylate 5 Mg Tabs (Amlodipine besylate) .... One tab by mouth once daily 5)  Ferrous Sulfate 325 (65 Fe) Mg Tabs (Ferrous sulfate) .... Take as directed.  Patient Instructions: 1)  RTC as needed.  Current Allergies (reviewed today): No known allergies

## 2010-11-25 NOTE — Progress Notes (Signed)
Summary: Medication Refill   Phone Note Call from Patient Call back at Home Phone [phone]   Reason for Call: Refill Medication Details for Reason: Medication Refill Summary of Call: Pt. has no refills left of her 51m. Does she need an OV or blood work? Initial call taken by: LDarliss Ridgel  September 05, 2010 8:46 AM  Follow-up for Phone Call        No Answer     LBernita BuffyCMA (Deborra Medina  September 05, 2010 4:37 PM. Rx sent until office visit.  scheduled ov for 10/02/2010 she would like to know if she needs labs prior to her office visit.  Follow-up by: LBernita BuffyCMA (Deborra Medina,  September 05, 2010 4:42 PM  Additional Follow-up for Phone Call Additional follow up Details #1::        please  obtain CBC, Iron studies. Additional Follow-up by: DLafayette DragonMD,  September 06, 2010 3:07 PM    Prescriptions: MERCAPTOPURINE 50 MG TABS (MERCAPTOPURINE) Take 1 tablet by mouth once a day.  #30 x 1   Entered by:   LBernita BuffyCMA ([place]   Authorized by:   DLafayette DragonMD   Signed by:   LBernita BuffyCMA ([place] on 09/05/2010   Method used:   Electronically to        [facility]ail[facility]       [address]      [place]  [address]      Ph: [phone]      Fax: [phone]  RxID::   [id number]  Appended Document: Medication Refill Left a message on patients machine to call back if she has questions [place] two or three days before her office visit for labs. I have sent Dottie a reminder.

## 2010-11-27 NOTE — Assessment & Plan Note (Signed)
Summary: crohns follow up/lk    History of Present Illness Visit Type: Follow-up Visit Primary GI MD: [name] MD Primary Provider: [name]  MD Requesting Provider: na Chief Complaint: F/u for crohn's. Pt states that she is doing great and denies any GI complaints  History of Present Illness:   This is a 60 year old white female with Crohn's disease since age 66. She is status post terminal ileum resection in 1981. A small bowel obstruction in 2008 resolved on conservative measures. She has chronic intermittent diarrhea. Her last colonoscopy in May 2011 showed active ileitis and a narrowed ileocolic anastomosis. The lumen was 50% reduced in size. The terminal ileum and colon were normal. She has been on 6-MP 50 mg daily and has mild anemia. Her hemoglobin was 11.7. She is on B12 supplements. She is feeling better. Her weight has been stable. She denies aphthous stomatitis. She has minor arthralgias, mostly in her hands.   GI Review of Systems      Denies abdominal pain, acid reflux, belching, bloating, chest pain, dysphagia with liquids, dysphagia with solids, heartburn, loss of appetite, nausea, vomiting, vomiting blood, weight loss, and  weight gain.        Denies anal fissure, black tarry stools, change in bowel habit, constipation, diarrhea, diverticulosis, fecal incontinence, heme positive stool, hemorrhoids, irritable bowel syndrome, jaundice, light color stool, liver problems, rectal bleeding, and  rectal pain.    Current Medications (verified): 1)  Mercaptopurine 50 Mg Tabs (Mercaptopurine) .... Take 1 Tablet By Mouth Once A Day. 2)  Amlodipine Besylate 5 Mg Tabs (Amlodipine Besylate) .... One Tab By Mouth Once Daily 3)  Ferrous Sulfate 325 (65 Fe) Mg Tabs (Ferrous Sulfate) .... Take As Directed. Must Have Office Visit For Further Refills! 4)  Cyanocobalamin 1000 Mcg/ml Soln (Cyanocobalamin) .... Inject 1 Ml Intramuscularly Once Per Month. Pharmacy- Please Include  Appropriate Needle/syringes!  Allergies (verified): No Known Drug Allergies  Past History:  Past Medical History: Reviewed history from 08/15/2010 and no changes required. HTN Chrohn's Disease s/p colectomy h/o SBO chronic diarrhea OSTEOMALACIA, UNSPECIFIED (ICD-268.2) Hx of MACROCYTIC ANEMIA (ICD-281.9) Hx of VITAMIN B12 DEFICIENCY (ICD-266.2) HEMORRHOIDS, INTERNAL (ICD-455.0) POLYARTHRITIS NOS, MULTIPLE SITES (ICD-716.59) MENOPAUSE, SURGICAL (ICD-627.4) LOSS, CENTRAL HEARING (ICD-389.14)    Past Surgical History: Reviewed history from 09/05/2010 and no changes required. HYSTERECTOMY:1975 Appendectomy Ileal resection x 3 and subtotal colon resection COLONOSCOPY INT. HEMMS: CROHNS: NARROW ANAST. 02/25/2005 EGD: DILATED: 02/25/2005 DEXA NORMAL: 01/29/2006  Colonoscopy Prior R Hemicolectomy o/w benign (Dr Olevia Perches) 02/26/2010            5 yrs B 12 INJECTIONS EVERY MONTH   Family History: Reviewed history from 04/15/2010 and no changes required. Father DIED  25 COMPLICATIONS OF COLOSTOMY/ CROHNS DZ. COLON:  Mother:DIED 80 YOA. HEART PROBLEMS HTN/  ARTERIOSCLEROSIS  BROTHER dec 9/10 from Cirrhosis  ETOH --CIRRHOSIS 1 YEAR TO LIVE 04/07 SISTER A 70 (LINDA) SISTER A 67 (RUTH) SISTER A 51 (TAMMY) Crohn's Family History of Colitis/Crohn's: Father, 3 aunts, 1 sister, 2 neices No FH of Colon Cancer:  Social History: Reviewed history from 04/10/2009 and no changes required. Married  Divorced 1989   3children Occupation: Surveyor, mining Alcohol Use - no Illicit Drug Use - no Patient is a former smoker.  Daily Caffeine Use  Review of Systems  The patient denies allergy/sinus, anemia, anxiety-new, arthritis/joint pain, back pain, blood in urine, breast changes/lumps, change in vision, confusion, cough, coughing up blood, depression-new, fainting, fatigue, fever, headaches-new, hearing problems, heart murmur, heart rhythm changes,  itching, menstrual pain, muscle  pains/cramps, night sweats, nosebleeds, pregnancy symptoms, shortness of breath, skin rash, sleeping problems, sore throat, swelling of feet/legs, swollen lymph glands, thirst - excessive , urination - excessive , urination changes/pain, urine leakage, vision changes, and voice change.         Pertinent positive and negative review of systems were noted in the above HPI. All other ROS was otherwise negative.   Vital Signs:  Patient profile:   60 year old female Height:      60.75 inches Weight:      113 pounds BMI:     21.61 BSA:     1.48 Pulse rate:   76 / minute Pulse rhythm:   regular BP sitting:   128 / 80  (left arm) Cuff size:   regular  Vitals Entered By: Hope Pigeon CMA (November 07, 2010 3:15 PM)  Physical Exam  General:  Well developed, well nourished, no acute distress. Eyes:  PERRLA, no icterus. Mouth:  No deformity or lesions, dentition normal. Lungs:  Clear throughout to auscultation. Heart:  Regular rate and rhythm; no murmurs, rubs,  or bruits. Abdomen:  soft abdomen with well-healed surgical scars. Normoactive bowel sounds. Tenderness right lower quadrant , no mass or fullness Rectal:  salt Hemoccult-positive stool Extremities:  No clubbing, cyanosis, edema or deformities noted. Skin:  Intact without significant lesions or rashes. Psych:  Alert and cooperative. Normal mood and affect.   Impression & Recommendations:  Problem # 1:  ANEMIA, IRON DEFICIENCY (ICD-280.9) continue iron supplements. A CBC should be completed in 3 months.  Problem # 2:  CROHN'S DISEASE (ICD-555.9)  Patient Instructions: 1)  Continue B12 supplement 1,000 mcg monthly. 2)  Continue iron supplements. 3)  Continue 6-MP 50 mg daily. 4)  Follow a low residue diet. 5)  A recall colonoscopy will be due in May 2016. 6)  CBC in 3 months. 7)  Copy sent to : Dr Lynnae Sandhoff 8)  The medication list was reviewed and reconciled.  All changed / newly prescribed medications were explained.  A  complete medication list was provided to the patient / caregiver.

## 2010-11-27 NOTE — Miscellaneous (Signed)
Summary: med list   Allergies: No Known Drug Allergies   Complete Medication List: 1)  Mercaptopurine 50 Mg Tabs (Mercaptopurine) .... Take 1 tablet by mouth once a day. 2)  Amlodipine Besylate 5 Mg Tabs (Amlodipine besylate) .... One tab by mouth once daily 3)  Ferrous Sulfate 325 (65 Fe) Mg Tabs (Ferrous sulfate) .... Take as directed. must have office visit for further refills! 4)  Cyanocobalamin 1000 Mcg/ml Soln (Cyanocobalamin) .... Once a month at work 5)  Bactrim Ds 800-160 Mg Tabs (Sulfamethoxazole-trimethoprim) .... One tablet two times a day x 10 days 6)  Flomax 0.4 Mg Caps (Tamsulosin hcl) .... Take one daily for 7 days for kidney stone 7)  Vicodin 5-500 Mg Tabs (Hydrocodone-acetaminophen) .... One every 4-6 hours as needed pain

## 2010-11-27 NOTE — Miscellaneous (Signed)
Summary: B12 injectable  --- 10/30/2010 4:21 PM, Carla Drape Nelson-Smith CMA (AAMA) wrote: I have a refill request for this patient to get cyanocabalamin. I dont see where we have given this to her since at least early 2010. It appears that her last b12 level was completed on 04/09/10 and was 346. I see that she has Crohns disease as well and we saw her colon on 02/26/10 and biopsies showed Chronic active ileitis with surface erosion and prominent pyloric metaplasia. She was last seen in the office on 02/28/09...Marland KitchenMarland KitchenMarland Kitchen Do you want me to go ahead and fill b12 since we havent given it to her for a while? or does she need office visit?  ---- 10/30/2010 8:55 PM, Lafayette Dragon MD wrote: please keep refilling indefinitely B12 injectable. Please advise pt to have her B12 level checked once a year.  Lab reminder sent to myself for around 04/09/10 Dottie Nelson-Smith CMA (AAMA)  October 31, 2010 9:18 AM    Clinical Lists Changes  Medications: Changed medication from CYANOCOBALAMIN 1000 MCG/ML SOLN (CYANOCOBALAMIN) once a month at work to CYANOCOBALAMIN 1000 MCG/ML SOLN (CYANOCOBALAMIN) Inject 1 ML Intramuscularly once per month. Pharmacy- please include appropriate needle/syringes! - Signed Rx of CYANOCOBALAMIN 1000 MCG/ML SOLN (CYANOCOBALAMIN) Inject 1 ML Intramuscularly once per month. Pharmacy- please include appropriate needle/syringes!;  #10 cc x 0;  Signed;  Entered by: Madlyn Frankel CMA (AAMA);  Authorized by: Lafayette Dragon MD;  Method used: Electronically to [facility]*, [address], [place], [place]  [address], Ph: [phone], Fax: [phone]    Prescriptions: CYANOCOBALAMIN 1000 MCG/ML SOLN (CYANOCOBALAMIN) Inject 1 ML Intramuscularly once per month. Pharmacy- please include appropriate needle/syringes!  #10 cc x 0   Entered by:   Madlyn Frankel CMA (AAMA)   Authorized by:   Lafayette Dragon MD   Signed by:   Madlyn Frankel CMA ([place]) on 10/31/2010   Method used:   Electronically to          [facility] (retail)       [address]       [place], [place]  [address]       Ph: [phone]       Fax: [phone]   RxID:   [id number]

## 2010-11-27 NOTE — Miscellaneous (Signed)
Summary: Rx Refills  Clinical Lists Changes  Medications: Changed medication from FERROUS SULFATE 325 (65 FE) MG TABS (FERROUS SULFATE) Take as directed. MUST HAVE OFFICE VISIT FOR FURTHER REFILLS! to FERROUS SULFATE 325 (65 FE) MG TABS (FERROUS SULFATE) Take as directed. - Signed Rx of FERROUS SULFATE 325 (65 FE) MG TABS (FERROUS SULFATE) Take as directed.;  #60 x 2;  Signed;  Entered by: Madlyn Frankel CMA (AAMA);  Authorized by: Lafayette Dragon MD;  Method used: Electronically to [facility]*, [address], [place], [place]  [address], Ph: [phone], Fax: [phone] Rx of MERCAPTOPURINE 50 MG TABS (MERCAPTOPURINE) Take 1 tablet by mouth once a day.;  #30 x 2;  Signed;  Entered by: Madlyn Frankel CMA (AAMA);  Authorized by: Lafayette Dragon MD;  Method used: Electronically to [facility] [address], [place], [place]  [address], Ph: [phone], Fax: [phone]    Prescriptions: MERCAPTOPURINE 50 MG TABS (MERCAPTOPURINE) Take 1 tablet by mouth once a day.  #30 x 2   Entered by:   Madlyn Frankel CMA (AAMA)   Authorized by:   Lafayette Dragon MD   Signed by:   Madlyn Frankel CMA ([place]) on 11/13/2010   Method used:   Electronically to        [facility] (retail)       [address]       [place], [place]  [address]       Ph: [phone]       Fax: [phone]   RxID:   [id number] FERROUS SULFATE 325 (65 FE) MG TABS (FERROUS SULFATE) Take as directed.  #60 x 2   Entered by:   Madlyn Frankel CMA ([facility])   Authorized by:   Lafayette Dragon MD   Signed by:   Madlyn Frankel CMA ([place]) on 11/13/2010   Method used:   Electronically to        [facility] (retail)       [address]       [place],   [address]       Ph: [phone]       Fax: [phone]   RxID:   [id number]

## 2011-02-02 ENCOUNTER — Other Ambulatory Visit: Payer: Self-pay

## 2011-02-02 ENCOUNTER — Other Ambulatory Visit: Payer: Self-pay | Admitting: Internal Medicine

## 2011-02-02 DIAGNOSIS — D509 Iron deficiency anemia, unspecified: Secondary | ICD-10-CM

## 2011-02-03 ENCOUNTER — Telehealth: Payer: Self-pay | Admitting: *Deleted

## 2011-02-03 NOTE — Telephone Encounter (Signed)
Spoke with patient and reminded her of her lab work due this week.

## 2011-02-03 NOTE — Telephone Encounter (Signed)
Message copied by Leone Payor on Tue Feb 03, 2011  9:32 AM ------      Message from: Leone Payor      Created: Wed Dec 31, 2010 11:15 AM       Call and remind pt. Repeat CBC due next week

## 2011-02-04 ENCOUNTER — Other Ambulatory Visit (INDEPENDENT_AMBULATORY_CARE_PROVIDER_SITE_OTHER): Payer: Self-pay

## 2011-02-04 DIAGNOSIS — D509 Iron deficiency anemia, unspecified: Secondary | ICD-10-CM

## 2011-02-04 LAB — CBC WITH DIFFERENTIAL/PLATELET
Basophils Relative: 0.4 % (ref 0.0–3.0)
Eosinophils Absolute: 0.1 10*3/uL (ref 0.0–0.7)
Eosinophils Relative: 1.2 % (ref 0.0–5.0)
HCT: 35.9 % — ABNORMAL LOW (ref 36.0–46.0)
Lymphs Abs: 1.7 10*3/uL (ref 0.7–4.0)
MCHC: 34.7 g/dL (ref 30.0–36.0)
MCV: 98.7 fl (ref 78.0–100.0)
Monocytes Absolute: 0.5 10*3/uL (ref 0.1–1.0)
Platelets: 252 10*3/uL (ref 150.0–400.0)
RBC: 3.64 Mil/uL — ABNORMAL LOW (ref 3.87–5.11)
WBC: 5.4 10*3/uL (ref 4.5–10.5)

## 2011-02-05 ENCOUNTER — Telehealth: Payer: Self-pay | Admitting: *Deleted

## 2011-02-05 DIAGNOSIS — D509 Iron deficiency anemia, unspecified: Secondary | ICD-10-CM

## 2011-02-05 NOTE — Telephone Encounter (Signed)
Message copied by Leone Payor on Thu Feb 05, 2011 10:03 AM ------      Message from: [name]      Created: Wed Feb 04, 2011 10:06 PM       please call pt with normal results, repeat CBC 6 months,

## 2011-02-05 NOTE — Telephone Encounter (Signed)
Patient given lab results as per Dr. Olevia Perches. Labs in [facility] for 08/07/11. Note to me to call and remind patient.

## 2011-02-17 ENCOUNTER — Other Ambulatory Visit: Payer: Self-pay | Admitting: Family Medicine

## 2011-02-17 DIAGNOSIS — Z1231 Encounter for screening mammogram for malignant neoplasm of breast: Secondary | ICD-10-CM

## 2011-02-18 ENCOUNTER — Ambulatory Visit
Admission: RE | Admit: 2011-02-18 | Discharge: 2011-02-18 | Disposition: A | Discharge status: Home / Self Care | Payer: Self-pay | Source: Ambulatory Visit | Attending: Family Medicine | Admitting: Family Medicine

## 2011-02-18 DIAGNOSIS — Z1231 Encounter for screening mammogram for malignant neoplasm of breast: Secondary | ICD-10-CM

## 2011-02-19 ENCOUNTER — Encounter: Payer: Self-pay | Admitting: *Deleted

## 2011-02-23 ENCOUNTER — Other Ambulatory Visit: Payer: Self-pay | Admitting: *Deleted

## 2011-02-23 MED ORDER — MERCAPTOPURINE 50 MG PO TABS: ORAL_TABLET | ORAL | Status: DC

## 2011-03-10 NOTE — Assessment & Plan Note (Signed)
[facility] OFFICE NOTE   VINETA, CARONE                      MRN:          [id number]  DATE:11/03/2007                            DOB:          August 12, 1951    Ms. Mortellaro is a 60 year old white female with Crohn's disease of the  terminal ileum since the age of 60, status post terminal ileum resection  in 60. She has a history of B12 deficiency, macrocytic anemia, chronic  diarrhea and partial small bowel obstruction requiring hospitalization  at [facility] in June 2008. She has been on 6 mercaptopurine 50 mg a  day which had to be cut in half to 25 mg a day in October because of  joint complaints. It appears that at this time that her joint complaints  started and she was also tapering her prednisone and it is actually more  likely that her arthralgias were reflection of the prednisone taper. She  is currently asymptomatic. She has occasional diarrhea, but no abdominal  pain and no rectal bleeding. Her weight has been stable. She takes B12  supplements on a monthly basis. Last colonoscopy in May 2006, showed  Crohn's disease of the ileocolic anastomosis which appeared to be  somewhat narrow. She also had an upper endoscopy at that time which was  essentially normal examination. Her small bowel biopsies were normal.  She had mild chronic gastritis. There was no villous atrophy.   MEDICATIONS:  1. B12 1000 mcg monthly.  2. 6 MP 75 mg p.o. daily.   PHYSICAL EXAMINATION:  Blood pressure 118/68, pulse 68, weight 120  pounds. She was in no distress.  LUNGS: Clear to auscultation.  COR: Normal S1, normal S2.  ABDOMEN: Soft and tender in right lower quadrant on deep pressure.  RECTAL: Examination not done.   IMPRESSION:  A 60 year old white female with Crohn's disease of the  terminal ilium and status post small bowel obstruction in June of 2008  which was responded to bowel rest. She had recurrence of  Crohn's disease  of the ileocolic anastomosis.   PLAN:  1. Stay on low residue diet.  2. Increase 6 MP to 50 mg a day.  3. Repeat CBC with diff four weeks after the 6MP is increased.  4. CT scan of the abdomen and pelvis to followup on the Crohn's      disease of the terminal ileum.     [name]. Olevia Perches, MD  Electronically Signed    DMB/MedQ  DD: 11/03/2007  DT: 11/03/2007  Job #: [id number]   cc:   Modesto Charon, MD

## 2011-03-10 NOTE — Assessment & Plan Note (Signed)
[facility]                         GASTROENTEROLOGY OFFICE NOTE   [name], [name]                        MRN:          [id number]  DATE:04/06/2007                            DOB:          [birth year]    Michelle Vasquez is a delightful 60 year old white female who has Crohn's  disease, initially diagnosed at the age of 63.  She subsequently  underwent terminal ileal resection in 1967 and again in 1981.  She has  chronic B12 deficiency, which she supplements with B12 shots on a  monthly basis.  She also has mild microcytic anemia.  She had an  emergent hospitalization 10 days ago with what sounded like small bowel  obstruction.  The patient was treated with bowel rest, IV steroids, and  responded with complete relief of abdominal pain.  She was discharged on  prednisone 40 mg a day and low residue diet.  Her 6-mercaptopurine which  she took in 2006 was discontinued.  Last colonoscopy in May, 2006 showed  narrowing of the ileocolic and anastomosis with mild activity.  At that  time, we considered restarting her 6MP, but the patient never returned  to discuss the therapy.   PHYSICAL EXAMINATION:  VITAL SIGNS:  Blood pressure 150/82, pulse 78,  weight 120 pounds.  GENERAL:  She was alert and oriented.  She did not appear to be  cushingoid.  LUNGS:  Clear to auscultation.  COR:  Normal S1 and S2.  ABDOMEN:  Soft.  Tender in the right lower quadrant and somewhat in the  left lower quadrant as well.  Bowel sounds were normoactive.  There was  no distention.  Right upper quadrant was normal.  RECTAL:  Normal.  Perianal area, rectal stoma was normal.  Stool was  hemoccult positive.   IMPRESSION:  75. A 60 year old white female with Crohn's disease, ileocolic      anastomosis, status post two prior ileal resections.  Patient is      symptomatically improved on steroids.  2. Hemoccult positive stool, most likely related to anastomotic      inflammatory  process.   PLAN:  1. Decrease prednisone to 30 mg per day for two weeks, 20 mg a day for      two weeks, then by 5 mg every two weeks.  2. Begin 6-mercaptopurine 50 mg daily.  3. I will see her in six weeks.  At that time, we will also check a      CBC and chemistries.     Michelle Vasquez. Olevia Perches, MD  Electronically Signed    DMB/MedQ  DD: 04/06/2007  DT: 04/07/2007  Job #: [id number]   cc:   Modesto Charon, MD

## 2011-03-10 NOTE — H&P (Signed)
Michelle Vasquez, Michelle Vasquez               ACCOUNT NO.:  [id number]   MEDICAL RECORD NO.:  [id number]          PATIENT TYPE:  INP   LOCATION:  2458                         FACILITY:  [facility]   PHYSICIAN:  Gatha Mayer, MD,FACGDATE OF BIRTH:  11-Mar-1951   DATE OF ADMISSION:  03/31/2007  DATE OF DISCHARGE:                              HISTORY & PHYSICAL   PRIMARY GASTROENTEROLOGIST:  [name]. Olevia Perches, M.D.   CHIEF COMPLAINT:  Nausea, vomiting, abdominal pain.   HISTORY OF PRESENT ILLNESS:  This is a 60 year old white female with a  history of Crohn disease.  She is status post three separate resections  of the terminal ileum in the remote past.  She underwent appendectomy at  age 69 which is when her Crohn's was initially diagnosed.  She is in  remission for the last 19 years, per her history.  The last colonoscopy  was done in May 2006, at which time actually she did have Crohn's and it  showed Crohn's at the narrowed neoterminal ileum, cecum, and there was  some question of bacterial overgrowth, and hemorrhoids were seen.  At  the same time, she had an upper endoscopy as she was complaining of  dysphagia.  There was no stricture but she was empirically dilated and  small bowel biopsies were taken to rule out villous atrophy.  She  states,and I do not have office records to confirm this, that she was  placed on 6-MP for a few months, may 3 months, and then this was  discontinued by Dr. Olevia Perches with the advise to please contact her if she  had recurrent symptoms suggestive of a Crohn's flare.  Her baseline  stooling pattern is about four loose stools a day.  No blood.  No pain.  No appetite derangement.   The patient has been doing well till yesterday a.m. when she had onset  of acute abdominal pain associated with nausea and vomiting.  She did go  to the emergency room, got some shots and Phenergan, and an analgesic  and felt better.  They offered her to see the GI doctor and then  possibly  be admitted, but she said that she preferred to go home and see  Dr. Olevia Perches as an outpatient.  However, once she got home and the  medications wore off her symptoms were back, and she proceeded back to  the ER.  She was given prescriptions for Phenergan, some sort of a  steroid Dosepak, and Percocet by the ER physician.   The patient had a CT scan now that is showing colitis in the transverse  and descending colon, narrowing and enhancement at the neoterminal  ileum, suggestive of Crohn's.  Again, she has gotten some Phenergan and  she feels a lot better here in the ER.  She does say that in the last 12-  18 hours that her stool was had some scant blood in it.   ALLERGIES:  None.   CURRENT MEDICATIONS:  Percocet, Phenergan p.r.n., and a prednisone dose  pack.  She has not started any of these.   PAST MEDICAL HISTORY:  1. Crohn disease of the small bowel, status post terminal ileal      resections on three separate occasions.  The patient states that      about 3-1/2 feet of colon are missing.  2. Status post appendectomy.  3. History of macrocytic anemia.   REVIEW OF SYSTEMS:  The patient recently was prescribed otic ear drops  because of an ear infection and she took about three doses of these and  then it was switched to oral amoxicillin for coverage.  The amoxicillin  caused abdominal bloating and she had discomfort in her lower abdomen  when she would stool or void urine, so she stopped these antibiotics  after a couple of days.  She no longer has any ear pain.  Her weight has  been stable.  Generally, her appetite is good.  She denies shortness of  breath, fevers, or pruritus.  She has had some oliguria in the last 12-  18 hours associated with decreased p.o. intake and nausea, vomiting.  Otherwise, her review of systems is negative.   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  She does not smoke, does not drink  alcohol.   FAMILY HISTORY:  Family history is positive for Crohn disease in her dad  and paternal aunts and some nieces.  There is also some history of  coronary disease.   LABORATORY:  Hemoglobin 13.5, hematocrit 39.6.  MCV 90.7, platelets 262.  White blood cell count 15.7.  Sodium 138, potassium 3.7, chloride 104,  CO2 25, BUN 5, creatinine 0.8, glucose 85.  AST is 28, ALT is 32.  The  assay did not include total bilirubin or alkaline phosphatase, so these  were not measured.  Urinalysis:  Small amount of leukocyte esterase, no  protein, no nitrites, no glucose.  Only 3-6 WBCs, and 0-2 RBCs.   PHYSICAL EXAMINATION:  GENERAL:  The patient is pleasant white female  who is a bit pale.  VITAL SIGNS:  Temperature is 97.8, blood pressure 108/69, pulse 76,  respirations 18, room air saturation 95%.  We do not have a weight yet.  HEENT:  Nonicteric.  No conjunctival pallor.  Extraocular movements  intact.  Oropharynx is moist and clear.  NECK:  No masses, no JVD.  PULMONARY:  She is clear to auscultation/percussion bilaterally.  CARDIOVASCULAR:  Regular rate rhythm.  No murmurs, rubs, or gallops.  GI:  Abdomen is soft, nondistended with active bowel sounds.  She is  tender without guarding or rebound diffusely.  RECTAL/GU/BREASTS:  Deferred.  EXTREMITIES:  No cyanosis, clubbing, or edema.  NEUROLOGIC:  She is alert and oriented x3.  No tremor.  She moves all  fours.  PSYCHIATRIC:  Her affect is within normal limits.  She is cooperative.   ASSESSMENT:  Nausea, vomiting, abdominal pain with some blood in her  diarrheal stools and CT suggestive of recurrent Crohn disease which now  appears to be in the colon.  She does have a history of small-bowel  Crohn's.  She is status post multiple resections for Crohn disease of  the small bowel.  So, this is likely a Crohn disease flare.  Note, that  she did have recent antibiotic exposure, so C. diff is within the differential as is ischemia as she  has seen some blood in the stool.   PLAN:  The patient is admitted to Dr. Wilkie Aye.  We will start  Solu-Medrol and provide p.r.n. analgesics  and antiemetics.  Support  hydration with IV fluids and clear liquids.  The patient may require a  diagnostic flex sig versus colonoscopy to confirm presence of Crohn's  but no decision has been made regarding endoscopy.      Michelle Freed, PA-C      Gatha Mayer, MD,FACG  Electronically Signed    SG/MEDQ  D:  03/31/2007  T:  03/31/2007  Job:  [id number]

## 2011-03-10 NOTE — Assessment & Plan Note (Signed)
Michelle Vasquez                         GASTROENTEROLOGY OFFICE NOTE   Michelle, Vasquez                        MRN:          [id number]  DATE:05/17/2007                            DOB:          Oct 13, 1951    Michelle Vasquez is a very nice 60 year old white female with Crohn's disease  of the terminal ileum status post terminal resection in 75 at age 58  and again in 64. She has microcytic anemia due to B12 deficiency and  has been on monthly B12 supplements. She has had chronic diarrhea and  flare up of her Crohn's disease in the last few months which has  responded to prednisone. She had been able to taper the prednisone down  to 15 mg a day and tomorrow was starting on 10 mg a day. The diarrhea  has subsided. Abdominal pain subsided. She was started on 6-  mercaptopurine 50 mg a day 4 weeks ago.   PHYSICAL EXAMINATION:  Blood pressure 102/66, pulse 72, and weight 122  pounds which showed 2-pound weight gain. The patient was not examined  today.   IMPRESSION:  Crohn's disease of the terminal ileum status post terminal  ileal resection. The patient currently in remission after a short course  of prednisone and 6-mercaptopurine.   PLAN:  1. Continue 6-MP.  2. Continue to taper prednisone. She will be off by the end of August.  3. Continue iron supplements.  4. Will check her B12 level, iron level and CBC.     Michelle Vasquez. Michelle Perches, MD  Electronically Signed    DMB/MedQ  DD: 05/17/2007  DT: 05/18/2007  Job #: [id number]

## 2011-03-13 NOTE — Discharge Summary (Signed)
Michelle Vasquez, Michelle Vasquez               ACCOUNT NO.:  [id number]   MEDICAL RECORD NO.:  [id number]          PATIENT TYPE:  INP   LOCATION:  6710                         FACILITY:  [facility]   PHYSICIAN:  Gatha Mayer, MD,FACGDATE OF BIRTH:  1950-11-26   DATE OF ADMISSION:  03/31/2007  DATE OF DISCHARGE:  04/03/2007                               DISCHARGE SUMMARY   ADMISSION DIAGNOSES:  1. Nausea, vomiting, abdominal pain.  2. Suspected partial small-bowel obstruction secondary to Crohn's      disease.   DISCHARGE DIAGNOSES:  1. Crohn's disease exacerbation with partial small-bowel obstruction.  2. Blood loss anemia, mild.  3. Hyperglycemia while on IV dextrose.  4. Prior ileal resection x3 and subtotal colon resection.  5. History of Crohn's colitis; CT scanning showed thickening of the      colon here.  6. Prior appendectomy.  7. History of macrocytic anemia.   HOSPITAL COURSE:  The patient was admitted with these problems; she was  managed conservatively with IV steroids.  She improved and was  discharged to home.  She was tolerating a diet at the end.  While here,  she had CT scanning.   LABORATORY SUMMARY:  Hemoglobin 13.5 on admission, 11.0 on discharge,  white count 15.7 on admission, 9.9 on discharge.  She had a glucose of  85 then 192, 164 and 120.  Her electrolytes and creatinine were normal.  Total bilirubin 1.6, otherwise LFTs normal.  She did have suggestion of  possible urinary tract infection with moderate hemoglobin, small  leukocyte esterase, 3-6 white cells.   DISCHARGE ACTIVITY:  As tolerated.   FOLLOWUP:  To call Dr. Olevia Perches for followup in the next couple of weeks.   DISCHARGE MEDICATIONS:  1. Percocet one to two every four hours as needed.  2. Prednisone 40 mg daily.  3. Calcium 500 mg.  4. Vitamin D three pills a day.  5. Phenergan 25 mg p.r.n.   Diet was low-residue.   She actually had an appointment with Dr. Olevia Perches on June 25 at 8:45 to  discuss  possible reinstitution of 6-MP in followup.  She was on that  transiently before and she may need that again for better long-term  control.      Gatha Mayer, MD,FACG  Electronically Signed     CEG/MEDQ  D:  05/02/2007  T:  05/02/2007  Job:  [id number]   cc:   [name]. Olevia Perches, MD

## 2011-04-01 ENCOUNTER — Telehealth: Payer: Self-pay | Admitting: *Deleted

## 2011-04-01 ENCOUNTER — Other Ambulatory Visit (INDEPENDENT_AMBULATORY_CARE_PROVIDER_SITE_OTHER): Payer: PRIVATE HEALTH INSURANCE

## 2011-04-01 DIAGNOSIS — E538 Deficiency of other specified B group vitamins: Secondary | ICD-10-CM

## 2011-04-01 LAB — VITAMIN B12: Vitamin B-12: 301 pg/mL (ref 211–911)

## 2011-04-01 NOTE — Telephone Encounter (Signed)
Message copied by Larina Bras on Wed Apr 01, 2011  8:07 AM ------      Message from: Larina Bras      Created: Tue Mar 31, 2011  8:49 AM       Left message for patient to call back.      ----- Message -----         From: Dixon Boos, CMA         Sent: 03/31/2011           To: Dixon Boos            Needs labs 04/10/11 for b12 levels... See emr note dated 10/31/10

## 2011-04-01 NOTE — Telephone Encounter (Signed)
Patient states she will come for labs today.

## 2011-04-02 ENCOUNTER — Telehealth: Payer: Self-pay | Admitting: *Deleted

## 2011-04-02 DIAGNOSIS — E538 Deficiency of other specified B group vitamins: Secondary | ICD-10-CM

## 2011-04-02 NOTE — Telephone Encounter (Signed)
Received a voice mail from patient to call her at [phone]. Called this number and was told she is unavailable until 10:30 AM

## 2011-04-02 NOTE — Telephone Encounter (Signed)
Message copied by Hulan Saas on Thu Apr 02, 2011  9:27 AM ------      Message from: Lafayette Dragon      Created: Wed Apr 01, 2011  6:02 PM       Please call pt, borderline low B12, continue B12 1000 ug IM monthly, recheck in 6 months

## 2011-04-02 NOTE — Telephone Encounter (Signed)
Left a message for patient to call me back. Labs in [facility] for 6 month recheck of Vit B 12 level. Note to remind patient

## 2011-04-02 NOTE — Telephone Encounter (Signed)
Patient notified of results and Dr. Nichola Sizer recommendations

## 2011-05-20 ENCOUNTER — Telehealth: Payer: Self-pay | Admitting: *Deleted

## 2011-05-20 ENCOUNTER — Encounter: Payer: Self-pay | Admitting: Family Medicine

## 2011-05-20 ENCOUNTER — Telehealth: Payer: Self-pay | Admitting: Internal Medicine

## 2011-05-20 ENCOUNTER — Ambulatory Visit (INDEPENDENT_AMBULATORY_CARE_PROVIDER_SITE_OTHER): Payer: PRIVATE HEALTH INSURANCE | Admitting: Family Medicine

## 2011-05-20 VITALS — BP 124/74 | HR 84 | Temp 98.9°F | Ht 60.0 in | Wt 113.0 lb

## 2011-05-20 DIAGNOSIS — J029 Acute pharyngitis, unspecified: Secondary | ICD-10-CM | POA: Insufficient documentation

## 2011-05-20 LAB — POCT RAPID STREP A (OFFICE): Rapid Strep A Screen: NEGATIVE

## 2011-05-20 MED ORDER — AMLODIPINE BESYLATE 5 MG PO TABS: 5.0000 mg | ORAL_TABLET | Freq: Every day | ORAL | Status: DC

## 2011-05-20 MED ORDER — AMOXICILLIN 500 MG PO TABS: 500.0000 mg | ORAL_TABLET | Freq: Two times a day (BID) | ORAL | Status: AC

## 2011-05-20 NOTE — Assessment & Plan Note (Signed)
4/4 centor criteria, but rapid strep negative. Treat regardless as strep throat with amoxicillin Advised to check with GI regarding mercaptopurine while on abx.

## 2011-05-20 NOTE — Telephone Encounter (Signed)
Pt dx with strep and Dr Danise Mina advised her to call Dr Olevia Perches to find out if she should stop her 38m while she is Amox. For 10 days. Dr BOlevia Perchesplease advise.

## 2011-05-20 NOTE — Patient Instructions (Addendum)
Sounds like strep pharyngitis. Take antibiotic amoxicillin 548m twice daily for 10 days. Push fluids and plenty of rest. May use tylenol for throat inflammation but no more than 3 grams /day. Salt water gargles. Suck on cold things like popsicles or warm things like herbal teas (whichever soothes the throat better). Return if fever >101.5, worsening pain, or trouble opening/closing mouth, or hoarse voice. May return to work when fever free for 24 hours. Good to see you today, call clinic with questions.

## 2011-05-20 NOTE — Telephone Encounter (Signed)
Rob from [facility] called stating that patient was there to pick up her amoxicillin rx., but it wasn't sent electronically. I saw under patient instructions for patient to take amoxicillin 500 mg bid x 10 days.  I gave verbal to Rob at [facility].

## 2011-05-20 NOTE — Telephone Encounter (Signed)
Thank you.  i thought I had sent in.

## 2011-05-20 NOTE — Telephone Encounter (Signed)
Pt needs to continue her 6MP while on Amoxacillin

## 2011-05-20 NOTE — Telephone Encounter (Signed)
Patient advised.  She will call back for any further questions or concerns

## 2011-05-20 NOTE — Progress Notes (Signed)
  Subjective:    Patient ID: Michelle Vasquez, female    DOB: [birth year], 60 y.o.   MRN: [id number]  HPI CC: fever, ST  3d h/o fever to 101-102.  Feeling knot right neck as well as tender to swallow.  + frontal HA.  + drainage in back of throat.  + dysuria and frequency, urgency.  Decreased appetite, not drinking enough water.  Nauseated.  No RN, congestion, cough, sneezing, sinus pressure.  No abd pain, changes in stool, blood in stool or urine.    Has been taking 1078m tylenol every 4 hours to control fever.  No sick contacts at home.  No smokers at home.  + sick ppl at work with ST, fever.  Review of Systems Per HPI    Objective:   Physical Exam  Nursing note and vitals reviewed. Constitutional: She appears well-developed and well-nourished. No distress.  HENT:  Head: Normocephalic and atraumatic.  Right Ear: Hearing, tympanic membrane, external ear and ear canal normal.  Left Ear: Hearing, tympanic membrane, external ear and ear canal normal.  Nose: Nose normal. No mucosal edema or rhinorrhea. Right sinus exhibits no maxillary sinus tenderness and no frontal sinus tenderness. Left sinus exhibits no maxillary sinus tenderness and no frontal sinus tenderness.  Mouth/Throat: Uvula is midline and mucous membranes are normal. Oropharyngeal exudate and posterior oropharyngeal erythema present. No posterior oropharyngeal edema or tonsillar abscesses.  Eyes: Conjunctivae and EOM are normal. Pupils are equal, round, and reactive to light. No scleral icterus.  Neck: Normal range of motion. Neck supple.  Cardiovascular: Normal rate, regular rhythm and intact distal pulses.  Exam reveals no friction rub.   Murmur (3/6 SEM best at LUSB) heard. Pulmonary/Chest: Effort normal and breath sounds normal. No respiratory distress. She has no wheezes. She has no rales.  Abdominal: Soft. Bowel sounds are normal. She exhibits no distension. There is no hepatosplenomegaly. There is no tenderness. There  is no rebound and no guarding.  Lymphadenopathy:    She has cervical adenopathy (R AC LAD).          Assessment & Plan:

## 2011-05-21 ENCOUNTER — Ambulatory Visit: Payer: PRIVATE HEALTH INSURANCE | Admitting: Family Medicine

## 2011-05-26 ENCOUNTER — Other Ambulatory Visit: Payer: Self-pay | Admitting: Family Medicine

## 2011-05-26 DIAGNOSIS — I10 Essential (primary) hypertension: Secondary | ICD-10-CM

## 2011-05-26 DIAGNOSIS — D509 Iron deficiency anemia, unspecified: Secondary | ICD-10-CM

## 2011-05-26 DIAGNOSIS — K5090 Crohn's disease, unspecified, without complications: Secondary | ICD-10-CM

## 2011-05-26 DIAGNOSIS — M130 Polyarthritis, unspecified: Secondary | ICD-10-CM

## 2011-05-29 ENCOUNTER — Other Ambulatory Visit (INDEPENDENT_AMBULATORY_CARE_PROVIDER_SITE_OTHER): Payer: PRIVATE HEALTH INSURANCE | Admitting: Family Medicine

## 2011-05-29 DIAGNOSIS — E538 Deficiency of other specified B group vitamins: Secondary | ICD-10-CM

## 2011-05-29 DIAGNOSIS — K5090 Crohn's disease, unspecified, without complications: Secondary | ICD-10-CM

## 2011-05-29 DIAGNOSIS — D509 Iron deficiency anemia, unspecified: Secondary | ICD-10-CM

## 2011-05-29 DIAGNOSIS — I10 Essential (primary) hypertension: Secondary | ICD-10-CM

## 2011-05-29 LAB — COMPREHENSIVE METABOLIC PANEL
Albumin: 4 g/dL (ref 3.5–5.2)
Alkaline Phosphatase: 71 U/L (ref 39–117)
BUN: 10 mg/dL (ref 6–23)
Glucose, Bld: 82 mg/dL (ref 70–99)
Potassium: 3.8 mEq/L (ref 3.5–5.1)

## 2011-05-29 LAB — CBC WITH DIFFERENTIAL/PLATELET
Basophils Relative: 0.4 % (ref 0.0–3.0)
Eosinophils Relative: 1.1 % (ref 0.0–5.0)
Lymphocytes Relative: 25.4 % (ref 12.0–46.0)
MCV: 98.4 fl (ref 78.0–100.0)
Monocytes Absolute: 0.6 10*3/uL (ref 0.1–1.0)
Monocytes Relative: 6.2 % (ref 3.0–12.0)
Neutrophils Relative %: 66.9 % (ref 43.0–77.0)
RBC: 3.47 Mil/uL — ABNORMAL LOW (ref 3.87–5.11)
WBC: 8.8 10*3/uL (ref 4.5–10.5)

## 2011-05-29 LAB — LIPID PANEL
LDL Cholesterol: 67 mg/dL (ref 0–99)
Total CHOL/HDL Ratio: 2
Triglycerides: 70 mg/dL (ref 0.0–149.0)

## 2011-06-05 ENCOUNTER — Ambulatory Visit (INDEPENDENT_AMBULATORY_CARE_PROVIDER_SITE_OTHER): Payer: PRIVATE HEALTH INSURANCE | Admitting: Family Medicine

## 2011-06-05 ENCOUNTER — Encounter: Payer: Self-pay | Admitting: Family Medicine

## 2011-06-05 DIAGNOSIS — Z639 Problem related to primary support group, unspecified: Secondary | ICD-10-CM | POA: Insufficient documentation

## 2011-06-05 DIAGNOSIS — I10 Essential (primary) hypertension: Secondary | ICD-10-CM

## 2011-06-05 DIAGNOSIS — Z0001 Encounter for general adult medical examination with abnormal findings: Secondary | ICD-10-CM | POA: Insufficient documentation

## 2011-06-05 DIAGNOSIS — Z0000 Encounter for general adult medical examination without abnormal findings: Secondary | ICD-10-CM | POA: Insufficient documentation

## 2011-06-05 DIAGNOSIS — K5090 Crohn's disease, unspecified, without complications: Secondary | ICD-10-CM

## 2011-06-05 DIAGNOSIS — J309 Allergic rhinitis, unspecified: Secondary | ICD-10-CM

## 2011-06-05 DIAGNOSIS — J302 Other seasonal allergic rhinitis: Secondary | ICD-10-CM | POA: Insufficient documentation

## 2011-06-05 DIAGNOSIS — E8941 Symptomatic postprocedural ovarian failure: Secondary | ICD-10-CM

## 2011-06-05 MED ORDER — LORATADINE 10 MG PO TABS: 10.0000 mg | ORAL_TABLET | Freq: Every day | ORAL | Status: DC

## 2011-06-05 MED ORDER — CITALOPRAM HYDROBROMIDE 10 MG PO TABS: 10.0000 mg | ORAL_TABLET | Freq: Every day | ORAL | Status: DC

## 2011-06-05 NOTE — Patient Instructions (Addendum)
Start celexa at 15m daily.   Start claritin nightly for allergies.  If not helping after 1-2 wks, let me know. Physical today looking good.  [place]. Return in 4-6 weeks for follow up on celexa. Check with Dr. BOlevia Perchesabout shingles shot and pneumonia shot.

## 2011-06-05 NOTE — Progress Notes (Signed)
Subjective:    Patient ID: Michelle Vasquez, female    DOB: 1950/12/01, 60 y.o.   MRN: [id number]  HPI CC: CPE  Mood - Becoming more anxious, paranoid feelings.  Doesn't want to go out of house, not sleeping well.  Some family issues.  Has been coming on for a while.  Has had several episodes where starts shaking, trouble talking.  celexa has worked well in past for this.  Some anhedonia.  No SI/HI.  + insomnia - trouble falling and staying asleep, doesn't feel rested.  Doesn't think snore, no PNDyspnea.  Also would like daily medicine for sinuses.  More congestion and PNDrip.  Sneezes occasionally, ears always popping, and constant clearing of throat.  No cough, no watery/itchy eyes.  Had been on pill in past.  Had used claritin before.  Basically year round symptoms but worse recently.  No new pets.  Crohn's well controlled on mercaptopurine.  Activity - doesn't do much to stay active, may get involved with exercise at lunch at work program  Preventative: Td 2005.   Pap - s/p total hysterectomy, ovaries removed at age 21yo, for possible fibroids.  Took hormone replacement for a few months, not after that.  Pap last year, normal.  Would like to bypass this year.   Colonoscopy last 2011, rpt in 10 yrs.  Sees Dr. Olevia Perches every 6 mo for blood work due to MP. Breast exam - last year was last one.  Last mammo 01/2011, Birads 1.  Rpt due 1 year.  Medications and allergies reviewed and updated in chart. Patient Active Problem List  Diagnoses  . VITAMIN B12 DEFICIENCY  . OSTEOMALACIA, UNSPECIFIED  . ANEMIA, IRON DEFICIENCY  . CERUMEN IMPACTION, LEFT  . LOSS, CENTRAL HEARING  . ESSENTIAL HYPERTENSION  . HEMORRHOIDS, INTERNAL  . CROHN'S DISEASE  . MICROSCOPIC HEMATURIA  . MENOPAUSE, SURGICAL  . POLYARTHRITIS NOS, MULTIPLE SITES  . HEADACHE, SINUS  . POSTNASAL DRIP SYNDROME  . DIARRHEA, CHRONIC  . FLANK PAIN, LEFT  . COLECTOMY, PARTIAL, WITH ANASTOMOSIS, HX OF  . Sore throat   Past  Medical History  Diagnosis Date  . HTN (hypertension)   . Crohn disease     s/p colectomy  . History of small bowel obstruction   . Chronic diarrhea   . Osteomalacia, unspecified   . Unspecified deficiency anemia   . B12 deficiency   . Internal hemorrhoids without mention of complication   . Unspecified polyarthropathy or polyarthritis, multiple sites   . Symptomatic states associated with artificial menopause   . Central hearing loss   . Perennial allergic rhinitis with seasonal variation    Past Surgical History  Procedure Date  . Total abdominal hysterectomy 1975    ovaries out as well  . Appendectomy   . Other surgical history 1960s, 1978, 1980    ileal resection x 3 and subtotal colon resection  . Esophagogastroduodenoscopy 02/25/05    Dilated stricture  . Colonoscopy 02/25/05    Internal hemms; crohns; narrow anast  . Colonoscopy 02/26/10    Prior right hemicolectomy o/w benign (Dr. Olevia Perches)  . Hemicolectomy     right   History  Substance Use Topics  . Smoking status: Former Smoker    Quit date: 10/26/1978  . Smokeless tobacco: Not on file  . Alcohol Use: No   Family History  Problem Relation Age of Onset  . Crohn's disease Father     colostomy  . Hypertension Mother   . Other Mother 8  Arteriosclerosis/Heart problems  . Alcohol abuse Brother   . Cirrhosis Brother   . Crohn's disease Sister   . Crohn's disease      Family history: father, 3 aunts; 1 sister, 2 neices  . Cancer Neg Hx    No Known Allergies Current Outpatient Prescriptions on File Prior to Visit  Medication Sig Dispense Refill  . amLODipine (NORVASC) 5 MG tablet Take 1 tablet (5 mg total) by mouth daily.  30 tablet  11  . cyanocobalamin (,VITAMIN B-12,) 1000 MCG/ML injection Inject 1,000 mcg into the muscle every 30 (thirty) days.        . ferrous sulfate 325 (65 FE) MG tablet Take 325 mg by mouth as directed.        . mercaptopurine (PURINETHOL) 50 MG tablet Take 1 tablet by mouth once daily   30 tablet  3   Review of Systems  Constitutional: Negative for fever, chills, activity change, appetite change, fatigue and unexpected weight change.  HENT: Negative for hearing loss and neck pain.   Eyes: Negative for visual disturbance.  Respiratory: Negative for cough, chest tightness, shortness of breath and wheezing.   Cardiovascular: Positive for leg swelling (ankle swelling occasionally). Negative for chest pain and palpitations.  Gastrointestinal: Negative for nausea, vomiting, abdominal pain, diarrhea, constipation, blood in stool and abdominal distention.  Genitourinary: Negative for hematuria and difficulty urinating.  Musculoskeletal: Negative for myalgias and arthralgias.  Skin: Negative for rash.  Neurological: Negative for dizziness, seizures, syncope and headaches.  Hematological: Does not bruise/bleed easily.  Psychiatric/Behavioral: Positive for dysphoric mood. The patient is nervous/anxious.        Objective:   Physical Exam  Nursing note and vitals reviewed. Constitutional: She is oriented to person, place, and time. She appears well-developed and well-nourished. No distress.  HENT:  Head: Normocephalic and atraumatic.  Right Ear: External ear normal.  Left Ear: External ear normal.  Nose: Nose normal.  Mouth/Throat: Oropharynx is clear and moist.  Eyes: Conjunctivae and EOM are normal. Pupils are equal, round, and reactive to light.  Neck: Normal range of motion. Neck supple. Carotid bruit is not present.  Cardiovascular: Normal rate, regular rhythm, normal heart sounds and intact distal pulses.   No murmur heard. Pulses:      Radial pulses are 2+ on the right side, and 2+ on the left side.  Pulmonary/Chest: Effort normal and breath sounds normal. No respiratory distress. She has no wheezes. She has no rales. Right breast exhibits no inverted nipple, no mass, no nipple discharge, no skin change and no tenderness. Left breast exhibits no inverted nipple, no  mass, no nipple discharge, no skin change and no tenderness. Breasts are symmetrical.  Abdominal: Soft. Bowel sounds are normal. She exhibits no distension and no mass. There is no tenderness. There is no rebound and no guarding.  Genitourinary:       deferred  Musculoskeletal: Normal range of motion. She exhibits no edema.  Lymphadenopathy:    She has no cervical adenopathy.  Neurological: She is alert and oriented to person, place, and time.       CN grossly intact, station and gait intact  Skin: Skin is warm and dry. No rash noted.  Psychiatric: She has a normal mood and affect. Her behavior is normal. Judgment and thought content normal.          Assessment & Plan:

## 2011-06-05 NOTE — Assessment & Plan Note (Signed)
stable °

## 2011-06-05 NOTE — Assessment & Plan Note (Signed)
Discussed healthy living. utd colon.  utd tetanus.   To check with GI about zostavax and pneumovax. Breast exam today. mammo Birads 1 01/2011. Consider checking DEXA next visit as h/o early menopause (hyst age 60yo, only HRT for a few months afterwards.)

## 2011-06-05 NOTE — Assessment & Plan Note (Signed)
Consider checking DEXA next visit as h/o early menopause (hyst age 60yo, only HRT for a few months afterwards.)

## 2011-06-05 NOTE — Assessment & Plan Note (Signed)
Adequate control on current med.  Continue.

## 2011-06-05 NOTE — Assessment & Plan Note (Signed)
Start with claritin, may add on INS.

## 2011-06-05 NOTE — Assessment & Plan Note (Signed)
Recent family stressors, anxiety/depression returning.  Previously did well on celexa, desires to restart.  RTC 4-6 wks after start.  Discussed possible worsening prior to improvement.  No SI/HI.

## 2011-07-03 ENCOUNTER — Ambulatory Visit (INDEPENDENT_AMBULATORY_CARE_PROVIDER_SITE_OTHER): Payer: PRIVATE HEALTH INSURANCE | Admitting: Family Medicine

## 2011-07-03 ENCOUNTER — Encounter: Payer: Self-pay | Admitting: Family Medicine

## 2011-07-03 VITALS — BP 138/78 | HR 80 | Temp 98.6°F | Wt 111.2 lb

## 2011-07-03 DIAGNOSIS — J302 Other seasonal allergic rhinitis: Secondary | ICD-10-CM

## 2011-07-03 DIAGNOSIS — F341 Dysthymic disorder: Secondary | ICD-10-CM

## 2011-07-03 DIAGNOSIS — F329 Major depressive disorder, single episode, unspecified: Secondary | ICD-10-CM

## 2011-07-03 DIAGNOSIS — K5090 Crohn's disease, unspecified, without complications: Secondary | ICD-10-CM

## 2011-07-03 DIAGNOSIS — F3342 Major depressive disorder, recurrent, in full remission: Secondary | ICD-10-CM | POA: Insufficient documentation

## 2011-07-03 DIAGNOSIS — F32A Depression, unspecified: Secondary | ICD-10-CM

## 2011-07-03 DIAGNOSIS — Z639 Problem related to primary support group, unspecified: Secondary | ICD-10-CM

## 2011-07-03 DIAGNOSIS — F419 Anxiety disorder, unspecified: Secondary | ICD-10-CM

## 2011-07-03 DIAGNOSIS — J309 Allergic rhinitis, unspecified: Secondary | ICD-10-CM

## 2011-07-03 HISTORY — DX: Anxiety disorder, unspecified: F41.9

## 2011-07-03 HISTORY — DX: Depression, unspecified: F32.A

## 2011-07-03 MED ORDER — CITALOPRAM HYDROBROMIDE 10 MG PO TABS: 10.0000 mg | ORAL_TABLET | Freq: Every day | ORAL | Status: DC

## 2011-07-03 MED ORDER — FEXOFENADINE HCL 180 MG PO TABS: 180.0000 mg | ORAL_TABLET | Freq: Every day | ORAL | Status: DC

## 2011-07-03 NOTE — Progress Notes (Signed)
  Subjective:    Patient ID: Michelle Vasquez, female    DOB: 03-23-1951, 60 y.o.   MRN: [id number]  HPI CC: f/u mood   mood/anxiety - started on celexa and doing well.  Yesterday and today felt more restless, internal anxiety and shakiness (similar sxs that brought on start of med).  Overall otherwise doing well.  Doesn't think would want increase in dose currently but rather monitor.  Allergies - lots of sinus issues that started over the last year - continues with sinus pressure.  Constant, nagging.  Noticing bleeding sinuses - drains into throat.  Also noticing sores in nasal passages.  Occasionally with RN, mainly with PNDrainage.  Takes claritin which helps some, but still continues to feel pressure.  Not a lot of congestion.  Using antihistamine eye drops for blurry and itchy eyes.  Not progressively worsening, never woken her up from sleep but at times awakens with sinus pressure.  claritin trial only helps temporarily.  Wt Readings from Last 3 Encounters:  07/03/11 111 lb 4 oz (50.463 kg)  06/05/11 112 lb 8 oz (51.03 kg)  05/20/11 113 lb (51.256 kg)   DEXA scan - 2007, normal.  H/o crohn's on 6MP longterm, remote h/o steroid use.  Review of Systems Per HPI    Objective:   Physical Exam  Nursing note and vitals reviewed. Constitutional: She appears well-developed and well-nourished. No distress.  HENT:  Head: Normocephalic and atraumatic.  Right Ear: Hearing, tympanic membrane, external ear and ear canal normal.  Left Ear: Hearing, tympanic membrane, external ear and ear canal normal.  Nose: Nose normal. No mucosal edema or rhinorrhea. Right sinus exhibits no maxillary sinus tenderness and no frontal sinus tenderness. Left sinus exhibits no maxillary sinus tenderness and no frontal sinus tenderness.  Mouth/Throat: Uvula is midline, oropharynx is clear and moist and mucous membranes are normal. No oropharyngeal exudate, posterior oropharyngeal edema, posterior oropharyngeal  erythema or tonsillar abscesses.  Skin: Skin is warm and dry. No rash noted.  Psychiatric: She has a normal mood and affect.          Assessment & Plan:

## 2011-07-03 NOTE — Patient Instructions (Addendum)
For allergies - change to allegra and start nasal saline irrigation 2-3 times daily.  Give me a call in 2 -3 weeks with update and depending on how things are going we will decide on plan. I have refilled celexa, we will stick at 69m dose. Let me know if anxiety is becoming continuous, we may increase celexa. Pass by Marion's office to schedule repeat DEXA scan (last 2007).

## 2011-07-03 NOTE — Assessment & Plan Note (Signed)
Improved on celexa. Continue current dose per pt preference. If continued sxs, will likely recommend increase to 33m daily. Pt to call if sxs continue or worsen.

## 2011-07-03 NOTE — Assessment & Plan Note (Signed)
Recheck DEXA given crohn's, surgical menopause at age 61 without significant HRT use.

## 2011-07-03 NOTE — Assessment & Plan Note (Signed)
Change to allegra, intranasal saline. Not good candidate for INS given epistaxis and possible nasal sores. May add 1st gen antihist vs singulair. To call us in 2 wks with update.

## 2011-07-03 NOTE — Assessment & Plan Note (Signed)
Good control on celexa, continue.

## 2011-07-06 ENCOUNTER — Ambulatory Visit
Admission: RE | Admit: 2011-07-06 | Discharge: 2011-07-06 | Disposition: A | Discharge status: Home / Self Care | Payer: PRIVATE HEALTH INSURANCE | Source: Ambulatory Visit | Attending: Family Medicine | Admitting: Family Medicine

## 2011-07-06 ENCOUNTER — Other Ambulatory Visit: Payer: Self-pay | Admitting: *Deleted

## 2011-07-06 DIAGNOSIS — K5090 Crohn's disease, unspecified, without complications: Secondary | ICD-10-CM

## 2011-07-06 HISTORY — PX: OTHER SURGICAL HISTORY: SHX169

## 2011-07-06 LAB — HM DEXA SCAN: HM Dexa Scan: NORMAL

## 2011-07-06 MED ORDER — FERROUS SULFATE 325 (65 FE) MG PO TABS: 325.0000 mg | ORAL_TABLET | ORAL | Status: DC

## 2011-07-06 MED ORDER — MERCAPTOPURINE 50 MG PO TABS: ORAL_TABLET | ORAL | Status: DC

## 2011-07-07 ENCOUNTER — Encounter: Payer: Self-pay | Admitting: *Deleted

## 2011-07-07 ENCOUNTER — Encounter: Payer: Self-pay | Admitting: Family Medicine

## 2011-07-23 ENCOUNTER — Encounter: Payer: Self-pay | Admitting: Family Medicine

## 2011-08-03 ENCOUNTER — Other Ambulatory Visit: Payer: Self-pay | Admitting: Internal Medicine

## 2011-08-03 DIAGNOSIS — D649 Anemia, unspecified: Secondary | ICD-10-CM

## 2011-08-05 ENCOUNTER — Other Ambulatory Visit (INDEPENDENT_AMBULATORY_CARE_PROVIDER_SITE_OTHER): Payer: PRIVATE HEALTH INSURANCE

## 2011-08-05 DIAGNOSIS — D649 Anemia, unspecified: Secondary | ICD-10-CM

## 2011-08-05 LAB — CBC WITH DIFFERENTIAL/PLATELET
Basophils Absolute: 0 10*3/uL (ref 0.0–0.1)
Basophils Relative: 0.3 % (ref 0.0–3.0)
Eosinophils Absolute: 0.1 10*3/uL (ref 0.0–0.7)
Lymphocytes Relative: 34.9 % (ref 12.0–46.0)
MCHC: 34 g/dL (ref 30.0–36.0)
MCV: 98.9 fl (ref 78.0–100.0)
Monocytes Absolute: 0.5 10*3/uL (ref 0.1–1.0)
Neutrophils Relative %: 55.1 % (ref 43.0–77.0)
Platelets: 244 10*3/uL (ref 150.0–400.0)
RBC: 3.39 Mil/uL — ABNORMAL LOW (ref 3.87–5.11)
RDW: 15.1 % — ABNORMAL HIGH (ref 11.5–14.6)

## 2011-08-06 ENCOUNTER — Telehealth: Payer: Self-pay | Admitting: *Deleted

## 2011-08-06 NOTE — Telephone Encounter (Signed)
Left a message on patient's voice mail with results

## 2011-08-06 NOTE — Telephone Encounter (Signed)
Message copied by Hulan Saas on Thu Aug 06, 2011  8:54 AM ------      Message from: Lafayette Dragon      Created: Wed Aug 05, 2011  5:51 PM       Please call ;t with  stable CBC

## 2011-08-13 LAB — BASIC METABOLIC PANEL
CO2: 27
Calcium: 8.3 — ABNORMAL LOW
Calcium: 8.5
Chloride: 108
Chloride: 110
Creatinine, Ser: 0.69
GFR calc Af Amer: >60
GFR calc Af Amer: >60
GFR calc non Af Amer: >60
GFR calc non Af Amer: >60
Glucose, Bld: 120 — ABNORMAL HIGH
Potassium: 3.9
Potassium: 4.1
Sodium: 140
Sodium: 141
Sodium: 142

## 2011-08-13 LAB — URINALYSIS, ROUTINE W REFLEX MICROSCOPIC
Glucose, UA: NEGATIVE
Ketones, ur: >80 — AB
Leukocytes, UA: NEGATIVE
Nitrite: NEGATIVE
Protein, ur: NEGATIVE
Protein, ur: NEGATIVE
Specific Gravity, Urine: 1.021
Urobilinogen, UA: 0.2
pH: 5.5

## 2011-08-13 LAB — POCT I-STAT CREATININE
Creatinine, Ser: 0.8
Operator id: 234501

## 2011-08-13 LAB — URINE MICROSCOPIC-ADD ON

## 2011-08-13 LAB — I-STAT 8, (EC8 V) (CONVERTED LAB)
BUN: 8
Chloride: 108
Glucose, Bld: 101 — ABNORMAL HIGH
Hemoglobin: 15.3 — ABNORMAL HIGH
Potassium: 3.7
Sodium: 138
pH, Ven: 7.337 — ABNORMAL HIGH

## 2011-08-13 LAB — COMPREHENSIVE METABOLIC PANEL
ALT: 32
AST: 28
Albumin: 3.5
CO2: 25
Calcium: 8.8
Chloride: 104
Creatinine, Ser: 0.82
GFR calc Af Amer: >60
GFR calc non Af Amer: >60
Sodium: 138
Total Bilirubin: 1.6 — ABNORMAL HIGH

## 2011-08-13 LAB — CBC
HCT: 33.1 — ABNORMAL LOW
HCT: 41.3
Hemoglobin: 11 — ABNORMAL LOW
MCHC: 33.8
MCHC: 33.8
MCV: 90.6
MCV: 90.7
Platelets: 262
Platelets: 280
RBC: 3.8 — ABNORMAL LOW
RBC: 4.36
RDW: 14.2 — ABNORMAL HIGH
WBC: 11.2 — ABNORMAL HIGH
WBC: 12.2 — ABNORMAL HIGH
WBC: 15.7 — ABNORMAL HIGH

## 2011-08-13 LAB — HEPATIC FUNCTION PANEL
ALT: 42 — ABNORMAL HIGH
AST: 41 — ABNORMAL HIGH
Albumin: 3.9
Alkaline Phosphatase: 84
Indirect Bilirubin: 0.9
Total Protein: 7.5

## 2011-08-13 LAB — DIFFERENTIAL
Basophils Relative: 0
Eosinophils Absolute: 0
Eosinophils Absolute: 0
Eosinophils Relative: 0
Eosinophils Relative: 0
Eosinophils Relative: 0
Lymphocytes Relative: 11 — ABNORMAL LOW
Lymphocytes Relative: 8 — ABNORMAL LOW
Lymphs Abs: 0.8
Lymphs Abs: 1.4
Lymphs Abs: 1.7
Monocytes Absolute: 0.3
Monocytes Absolute: 0.6
Neutrophils Relative %: 85 — ABNORMAL HIGH

## 2011-08-13 LAB — OCCULT BLOOD X 1 CARD TO LAB, STOOL: Fecal Occult Bld: POSITIVE

## 2011-09-28 ENCOUNTER — Other Ambulatory Visit: Payer: Self-pay | Admitting: *Deleted

## 2011-09-28 MED ORDER — CYANOCOBALAMIN 1000 MCG/ML IJ SOLN: 1000.0000 ug | INTRAMUSCULAR | Status: DC

## 2011-11-17 ENCOUNTER — Encounter: Payer: Self-pay | Admitting: *Deleted

## 2011-11-17 ENCOUNTER — Other Ambulatory Visit: Payer: Self-pay | Admitting: Internal Medicine

## 2011-11-17 MED ORDER — MERCAPTOPURINE 50 MG PO TABS: ORAL_TABLET | ORAL | Status: DC

## 2011-11-17 NOTE — Telephone Encounter (Signed)
Advised patient that I have sent 6 mp to her pharmacy until her appointment and that we will wait until her appointment date to get any labwork needed.

## 2011-11-27 ENCOUNTER — Ambulatory Visit: Payer: PRIVATE HEALTH INSURANCE | Admitting: Internal Medicine

## 2011-12-03 ENCOUNTER — Encounter: Payer: Self-pay | Admitting: *Deleted

## 2011-12-11 ENCOUNTER — Ambulatory Visit (INDEPENDENT_AMBULATORY_CARE_PROVIDER_SITE_OTHER): Payer: PRIVATE HEALTH INSURANCE | Admitting: Internal Medicine

## 2011-12-11 ENCOUNTER — Encounter: Payer: Self-pay | Admitting: Internal Medicine

## 2011-12-11 ENCOUNTER — Other Ambulatory Visit (INDEPENDENT_AMBULATORY_CARE_PROVIDER_SITE_OTHER): Payer: PRIVATE HEALTH INSURANCE

## 2011-12-11 VITALS — BP 120/64 | HR 88 | Ht 60.0 in | Wt 112.0 lb

## 2011-12-11 DIAGNOSIS — R1013 Epigastric pain: Secondary | ICD-10-CM

## 2011-12-11 DIAGNOSIS — K5010 Crohn's disease of large intestine without complications: Secondary | ICD-10-CM

## 2011-12-11 DIAGNOSIS — K5090 Crohn's disease, unspecified, without complications: Secondary | ICD-10-CM

## 2011-12-11 LAB — CBC WITH DIFFERENTIAL/PLATELET
Eosinophils Relative: 7.4 % — ABNORMAL HIGH (ref 0.0–5.0)
HCT: 40.3 % (ref 36.0–46.0)
Hemoglobin: 13.5 g/dL (ref 12.0–15.0)
Lymphs Abs: 1.8 10*3/uL (ref 0.7–4.0)
Monocytes Relative: 7.6 % (ref 3.0–12.0)
Neutro Abs: 3.2 10*3/uL (ref 1.4–7.7)
WBC: 6 10*3/uL (ref 4.5–10.5)

## 2011-12-11 LAB — SEDIMENTATION RATE: Sed Rate: 37 mm/hr — ABNORMAL HIGH (ref 0–22)

## 2011-12-11 LAB — IBC PANEL: Iron: 78 ug/dL (ref 42–145)

## 2011-12-11 LAB — COMPREHENSIVE METABOLIC PANEL
CO2: 29 mEq/L (ref 19–32)
Creatinine, Ser: 0.7 mg/dL (ref 0.4–1.2)
GFR: 88.86 mL/min (ref >60.00)
Glucose, Bld: 87 mg/dL (ref 70–99)
Total Bilirubin: 0.5 mg/dL (ref 0.3–1.2)

## 2011-12-11 MED ORDER — ALIGN PO CAPS: 1.0000 | ORAL_CAPSULE | Freq: Every day | ORAL | Status: AC

## 2011-12-11 MED ORDER — OMEPRAZOLE 20 MG PO CPDR: 20.0000 mg | DELAYED_RELEASE_CAPSULE | Freq: Every day | ORAL | Status: DC

## 2011-12-11 NOTE — Progress Notes (Signed)
Michelle Vasquez 1951/06/04 MRN [id number]   History of Present Illness:  This is a 61 year old white female with Crohn's disease since age 75. She is status post terminal ileum resection in 1981. She also had a small bowel obstruction in 2008 which did not require surgery. Her sister has Crohn's disease as well as several other family members. She has been on 6-MP 50 mg daily for active ileitis which was found on the last colonoscopy in May 2011. She had a 50% narrowing of the ileocolic anastomosis. She is having intermittent diarrhea. She has been on B12 supplements monthly and iron supplements as well. Most recently, she has developed heartburn, dyspepsia and food intolerance for which she takes over-the-counter acid reducers. Her weight has remained stable. She denies any vomiting.   Past Medical History  Diagnosis Date  . HTN (hypertension)   . Crohn disease     s/p colectomy  . History of small bowel obstruction   . Chronic diarrhea   . Osteomalacia, unspecified   . Unspecified deficiency anemia   . B12 deficiency   . Internal hemorrhoids without mention of complication   . Unspecified polyarthropathy or polyarthritis, multiple sites   . Symptomatic states associated with artificial menopause   . Central hearing loss   . Perennial allergic rhinitis with seasonal variation    Past Surgical History  Procedure Date  . Total abdominal hysterectomy 1975    ovaries out as well  . Appendectomy   . Other surgical history 1960s, 1978, 1980    ileal resection x 3 and subtotal colon resection  . Esophagogastroduodenoscopy 02/25/05    Dilated stricture  . Colonoscopy 02/25/05    Internal hemms; crohns; narrow anast  . Colonoscopy 02/26/10    Prior right hemicolectomy o/w benign (Dr. Olevia Perches)  . Hemicolectomy     right  . Dexa 07/06/2011    normal T score -1.0 femur, spine    reports that she quit smoking about 33 years ago. She has never used smokeless tobacco. She reports that she does  not drink alcohol or use illicit drugs. family history includes Alcohol abuse in her brother; Cirrhosis in her brother; Crohn's disease in her father, sister, and unspecified family members; Heart disease (age of onset:60) in her mother; and Hypertension in her mother.  There is no history of Colon cancer. No Known Allergies      Review of Systems: Positive for heartburn, indigestion, food intolerance.  The remainder of the 10 point ROS is negative except as outlined in H&P   Physical Exam: General appearance  Well developed, in no distress. Eyes- non icteric. HEENT nontraumatic, normocephalic. Mouth no lesions, tongue papillated, no cheilosis. Neck supple without adenopathy, thyroid not enlarged, no carotid bruits, no JVD. Lungs Clear to auscultation bilaterally. Cor normal S1, normal S2, regular rhythm, no murmur,  quiet precordium. Abdomen: Mildly protuberant but soft. Normoactive bowel sounds. Mild tenderness right upper quadrant. Increased tympany upper abdomen. Rectal: Not done Extremities no pedal edema. Skin no lesions. Neurological alert and oriented x 3. Psychological normal mood and affect.  Assessment and Plan:  Problem #1 Crohn's disease at the ileocolic anastomosis. 50% narrowing on the last colonoscopy in May 2011. She has increasing symptoms of dyspepsia which may be a result of a partial obstruction. It may also be caused by biliary dysfunction. We will go ahead with an upper abdominal ultrasound. I have given her samples of probiotics and have started her on Prilosec 20 mg daily. We are checking a B12,  iron studies, CBC, metabolic panel and sed.rate. We will also continue to refill her 6-MP.    12/11/2011 [name]

## 2011-12-11 NOTE — Patient Instructions (Addendum)
We have sent the following medications to your pharmacy for you to pick up at your convenience: Prilosec 20 mg daily We have given you samples of Align. This puts good bacteria back into your intestines. You should take 1 capsule by mouth once daily. If this works well for you, it can be purchased over the counter. Your physician has requested that you go to the basement for the following lab work before leaving today: CBC, CMET, IBC, B12, Sed Rate [facility] at 9:15 am. Please arrive 15 minutes prior to your appointment for registration. Make certain not to have anything to eat or drink 6 hours prior to your appointment.  CC: Dr Danise Mina

## 2011-12-14 LAB — VITAMIN B12: Vitamin B-12: >1500 pg/mL — ABNORMAL HIGH (ref 211–911)

## 2011-12-15 ENCOUNTER — Ambulatory Visit
Admission: RE | Admit: 2011-12-15 | Discharge: 2011-12-15 | Disposition: A | Discharge status: Home / Self Care | Payer: PRIVATE HEALTH INSURANCE | Source: Ambulatory Visit | Attending: Internal Medicine | Admitting: Internal Medicine

## 2011-12-15 DIAGNOSIS — R1013 Epigastric pain: Secondary | ICD-10-CM

## 2011-12-15 DIAGNOSIS — K760 Fatty (change of) liver, not elsewhere classified: Secondary | ICD-10-CM

## 2011-12-15 HISTORY — DX: Fatty (change of) liver, not elsewhere classified: K76.0

## 2011-12-16 ENCOUNTER — Other Ambulatory Visit (HOSPITAL_COMMUNITY): Payer: PRIVATE HEALTH INSURANCE

## 2011-12-21 ENCOUNTER — Other Ambulatory Visit: Payer: Self-pay | Admitting: *Deleted

## 2011-12-21 MED ORDER — MERCAPTOPURINE 50 MG PO TABS: ORAL_TABLET | ORAL | Status: DC

## 2011-12-28 ENCOUNTER — Other Ambulatory Visit: Payer: Self-pay | Admitting: *Deleted

## 2011-12-28 MED ORDER — FERROUS SULFATE 325 (65 FE) MG PO TABS: 325.0000 mg | ORAL_TABLET | ORAL | Status: DC

## 2011-12-28 NOTE — Telephone Encounter (Signed)
Message copied by Larina Bras on Mon Dec 28, 2011  8:25 AM ------      Message from: Lafayette Dragon      Created: Sat Dec 26, 2011 11:08 AM      Regarding: iron supplements       Please refill Iron supplements. Chronic GIB from Crohn's disease.      ----- Message -----         From: Dixon Boos, CMA         Sent: 12/25/2011   4:26 PM           To: Lafayette Dragon, MD            Dr Olevia Perches-      Patient requests refills on Ferrous Sulfate. Per her labs on 12/11/11, her Hgb and IBC panel were fine. Do you want me to continue to refill?

## 2012-02-23 ENCOUNTER — Other Ambulatory Visit: Payer: Self-pay

## 2012-02-23 MED ORDER — OMEPRAZOLE 20 MG PO CPDR: 20.0000 mg | DELAYED_RELEASE_CAPSULE | Freq: Every day | ORAL | Status: DC

## 2012-02-29 ENCOUNTER — Telehealth: Payer: Self-pay | Admitting: *Deleted

## 2012-02-29 MED ORDER — CYANOCOBALAMIN 1000 MCG/ML IJ SOLN: 1000.0000 ug | INTRAMUSCULAR | Status: DC

## 2012-02-29 NOTE — Telephone Encounter (Signed)
rx sent

## 2012-02-29 NOTE — Telephone Encounter (Signed)
Message copied by Larina Bras on Mon Feb 29, 2012  8:28 AM ------      Message from: Lafayette Dragon      Created: Thu Feb 25, 2012 11:10 PM      Regarding: B12       Please continue B12 injection. 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 However, her last b12 level in February showed her levels to be greater than 1500. Do you want me to continue filling her B12 or does she need to stop for a few months?

## 2012-04-01 ENCOUNTER — Other Ambulatory Visit: Payer: Self-pay | Admitting: Internal Medicine

## 2012-04-01 MED ORDER — MERCAPTOPURINE 50 MG PO TABS: ORAL_TABLET | ORAL | Status: DC

## 2012-04-01 NOTE — Telephone Encounter (Signed)
rx sent

## 2012-04-04 ENCOUNTER — Ambulatory Visit
Admission: RE | Admit: 2012-04-04 | Discharge: 2012-04-04 | Disposition: A | Discharge status: Home / Self Care | Payer: PRIVATE HEALTH INSURANCE | Source: Ambulatory Visit | Attending: Family Medicine | Admitting: Family Medicine

## 2012-04-04 ENCOUNTER — Other Ambulatory Visit: Payer: Self-pay | Admitting: Family Medicine

## 2012-04-04 DIAGNOSIS — Z1231 Encounter for screening mammogram for malignant neoplasm of breast: Secondary | ICD-10-CM

## 2012-04-06 ENCOUNTER — Encounter: Payer: Self-pay | Admitting: *Deleted

## 2012-05-26 ENCOUNTER — Other Ambulatory Visit: Payer: Self-pay | Admitting: Family Medicine

## 2012-05-26 DIAGNOSIS — E538 Deficiency of other specified B group vitamins: Secondary | ICD-10-CM

## 2012-05-26 DIAGNOSIS — D509 Iron deficiency anemia, unspecified: Secondary | ICD-10-CM

## 2012-05-26 DIAGNOSIS — I10 Essential (primary) hypertension: Secondary | ICD-10-CM

## 2012-05-30 ENCOUNTER — Other Ambulatory Visit: Payer: Self-pay | Admitting: *Deleted

## 2012-05-30 MED ORDER — CYANOCOBALAMIN 1000 MCG/ML IJ SOLN: 1000.0000 ug | INTRAMUSCULAR | Status: DC

## 2012-06-01 ENCOUNTER — Other Ambulatory Visit (INDEPENDENT_AMBULATORY_CARE_PROVIDER_SITE_OTHER): Payer: PRIVATE HEALTH INSURANCE

## 2012-06-01 DIAGNOSIS — D509 Iron deficiency anemia, unspecified: Secondary | ICD-10-CM

## 2012-06-01 DIAGNOSIS — I10 Essential (primary) hypertension: Secondary | ICD-10-CM

## 2012-06-01 DIAGNOSIS — E538 Deficiency of other specified B group vitamins: Secondary | ICD-10-CM

## 2012-06-01 LAB — VITAMIN B12: Vitamin B-12: >1500 pg/mL — ABNORMAL HIGH (ref 211–911)

## 2012-06-01 LAB — CBC WITH DIFFERENTIAL/PLATELET
Basophils Relative: 0.6 % (ref 0.0–3.0)
Eosinophils Absolute: 0.4 10*3/uL (ref 0.0–0.7)
Eosinophils Relative: 6.9 % — ABNORMAL HIGH (ref 0.0–5.0)
Lymphocytes Relative: 38.6 % (ref 12.0–46.0)
Monocytes Relative: 9 % (ref 3.0–12.0)
Neutrophils Relative %: 44.9 % (ref 43.0–77.0)
RBC: 3.81 Mil/uL — ABNORMAL LOW (ref 3.87–5.11)
WBC: 5.2 10*3/uL (ref 4.5–10.5)

## 2012-06-01 LAB — COMPREHENSIVE METABOLIC PANEL
ALT: 27 U/L (ref 0–35)
BUN: 9 mg/dL (ref 6–23)
CO2: 29 mEq/L (ref 19–32)
Calcium: 9.4 mg/dL (ref 8.4–10.5)
Chloride: 104 mEq/L (ref 96–112)
Creatinine, Ser: 0.9 mg/dL (ref 0.4–1.2)
GFR: 70.17 mL/min (ref >60.00)
Glucose, Bld: 79 mg/dL (ref 70–99)

## 2012-06-06 ENCOUNTER — Other Ambulatory Visit: Payer: Self-pay | Admitting: *Deleted

## 2012-06-06 ENCOUNTER — Other Ambulatory Visit: Payer: Self-pay | Admitting: Family Medicine

## 2012-06-06 MED ORDER — MERCAPTOPURINE 50 MG PO TABS: ORAL_TABLET | ORAL | Status: DC

## 2012-06-07 ENCOUNTER — Encounter: Payer: Self-pay | Admitting: Family Medicine

## 2012-06-07 ENCOUNTER — Ambulatory Visit (INDEPENDENT_AMBULATORY_CARE_PROVIDER_SITE_OTHER): Payer: PRIVATE HEALTH INSURANCE | Admitting: Family Medicine

## 2012-06-07 VITALS — BP 140/78 | HR 72 | Temp 98.1°F | Ht 60.0 in | Wt 117.0 lb

## 2012-06-07 DIAGNOSIS — K5090 Crohn's disease, unspecified, without complications: Secondary | ICD-10-CM

## 2012-06-07 DIAGNOSIS — J302 Other seasonal allergic rhinitis: Secondary | ICD-10-CM

## 2012-06-07 DIAGNOSIS — Z0000 Encounter for general adult medical examination without abnormal findings: Secondary | ICD-10-CM

## 2012-06-07 DIAGNOSIS — J309 Allergic rhinitis, unspecified: Secondary | ICD-10-CM

## 2012-06-07 MED ORDER — FLUTICASONE PROPIONATE 50 MCG/ACT NA SUSP: 2.0000 | Freq: Every day | NASAL | Status: DC

## 2012-06-07 MED ORDER — OLOPATADINE HCL 0.1 % OP SOLN: 1.0000 [drp] | Freq: Two times a day (BID) | OPHTHALMIC | Status: DC

## 2012-06-07 NOTE — Patient Instructions (Signed)
For allergies/sinus congestion - try daily flonase for 2-3 weeks to see if will help, if not let me know, may try another nasal steroid. For eyes - patanol eye drops twice daily as needed. Good to see you today, call us with questions. Return in 1 year for physical or as needed.

## 2012-06-07 NOTE — Assessment & Plan Note (Signed)
Chronic, stable. Sees Dr. Olevia Perches twice yearly. On B12 shots.

## 2012-06-07 NOTE — Progress Notes (Signed)
Subjective:    Patient ID: Michelle Vasquez, female    DOB: 27-Feb-1951, 61 y.o.   MRN: [id number]  HPI CC: CPE  Sinus issues - carries sinus headache.  Significant drainage.  If doesn't take zyrtec, gets worse.  Also with ringing in ears and trouble with smell and taste for last month.  Not a lot of sputum production  More congestion.  + PNdrainage leading to nausea.  Preventative:  Td 2005.  Flu shot yearly at work.  PPD at work yearly. Pap - s/p total hysterectomy, ovaries removed at age 35yo, for possible fibroids. Took hormone replacement for a few months, not after that.  Would like to stop. Colonoscopy last 2011, rpt in 10 yrs. Sees Dr. Olevia Perches every 6 mo for blood work due to MP.  Breast exam - Last mammo 03/2012, Birads 1. Rpt due 1 year.   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  Past histories reviewed and updated if relevant as below. Patient Active Problem List  Diagnosis  . VITAMIN B12 DEFICIENCY  . OSTEOMALACIA, UNSPECIFIED  . ANEMIA, IRON DEFICIENCY  . LOSS, CENTRAL HEARING  . ESSENTIAL HYPERTENSION  . HEMORRHOIDS, INTERNAL  . CROHN'S DISEASE  . MICROSCOPIC HEMATURIA  . MENOPAUSE, SURGICAL  . POLYARTHRITIS NOS, MULTIPLE SITES  . POSTNASAL DRIP SYNDROME  . DIARRHEA, CHRONIC  . COLECTOMY, PARTIAL, WITH ANASTOMOSIS, HX OF  . Healthcare maintenance  . Perennial allergic rhinitis with seasonal variation  . Unspecified family circumstance  . Anxiety and depression   Past Medical History  Diagnosis Date  . HTN (hypertension)   . Crohn disease     s/p colectomy  . History of small bowel obstruction   . Chronic diarrhea   . Osteomalacia, unspecified   . Unspecified deficiency anemia   . B12 deficiency   . Internal hemorrhoids without mention of complication   .  Unspecified polyarthropathy or polyarthritis, multiple sites   . Central hearing loss   . Perennial allergic rhinitis with seasonal variation    Past Surgical History  Procedure Date  . Total abdominal hysterectomy 1975    ovaries out as well  . Appendectomy   . Other surgical history 1960s, 1978, 1980    ileal resection x 3 and subtotal colon resection  . Esophagogastroduodenoscopy 02/25/05    Dilated stricture  . Colonoscopy 02/25/05    Internal hemms; crohns; narrow anast  . Colonoscopy 02/26/10    Prior right hemicolectomy o/w benign (Dr. Olevia Perches)  . Hemicolectomy 1980    all small intestine, none large  . Dexa 07/06/2011    normal T score -1.0 femur, spine   History  Substance Use Topics  . Smoking status: Former Smoker    Quit date: 10/26/1978  . Smokeless tobacco: Never Used  . Alcohol Use: No   Family History  Problem Relation Age of Onset  . Crohn's disease Father     colostomy  . Hypertension Mother   . Heart disease Mother 37  . Alcohol abuse Brother   . Cirrhosis Brother   . Crohn's disease Sister   . Crohn's disease      Aunts x 3  . Crohn's disease      neice x 2  . Colon cancer Neg Hx   . Cancer Neg Hx   . Diabetes Neg Hx    No Known Allergies Current  Outpatient Prescriptions on File Prior to Visit  Medication Sig Dispense Refill  . amLODipine (NORVASC) 5 MG tablet TAKE ONE (1) TABLET BY MOUTH EACH DAY  30 tablet  3  . cetirizine (ZYRTEC) 10 MG tablet Take 10 mg by mouth daily.      . citalopram (CELEXA) 10 MG tablet Take 1 tablet (10 mg total) by mouth daily.  30 tablet  11  . cyanocobalamin (,VITAMIN B-12,) 1000 MCG/ML injection Inject 1 mL (1,000 mcg total) into the muscle every 30 (thirty) days.  3 mL  1  . mercaptopurine (PURINETHOL) 50 MG tablet Take 1 tablet by mouth once daily  30 tablet  3  . omeprazole (PRILOSEC) 20 MG capsule Take 1 capsule (20 mg total) by mouth daily.  30 capsule  11  . DISCONTD: ferrous sulfate 325 (65 FE) MG tablet Take 1  tablet (325 mg total) by mouth as directed.  60 tablet  3  . bifidobacterium infantis (ALIGN) capsule Take 1 capsule by mouth daily.  7 capsule  0     Review of Systems  Constitutional: Negative for fever, chills, activity change, appetite change, fatigue and unexpected weight change.  HENT: Negative for hearing loss and neck pain.   Eyes: Negative for visual disturbance.  Respiratory: Negative for cough, chest tightness, shortness of breath and wheezing.   Cardiovascular: Negative for chest pain, palpitations and leg swelling.  Gastrointestinal: Positive for abdominal pain (occasional aches from crohn's). Negative for nausea, vomiting, diarrhea, constipation, blood in stool and abdominal distention.  Genitourinary: Negative for hematuria and difficulty urinating.  Musculoskeletal: Negative for myalgias and arthralgias.  Skin: Negative for rash.  Neurological: Positive for headaches (sinus). Negative for dizziness, seizures and syncope.  Hematological: Does not bruise/bleed easily.  Psychiatric/Behavioral: Negative for dysphoric mood. The patient is not nervous/anxious.        Objective:   Physical Exam  Nursing note and vitals reviewed. Constitutional: She is oriented to person, place, and time. She appears well-developed and well-nourished. No distress.  HENT:  Head: Normocephalic and atraumatic.  Right Ear: External ear normal.  Left Ear: External ear normal.  Nose: Nose normal.  Mouth/Throat: Oropharynx is clear and moist. No oropharyngeal exudate.  Eyes: Conjunctivae and EOM are normal. Pupils are equal, round, and reactive to light. No scleral icterus.  Neck: Normal range of motion. Neck supple.  Cardiovascular: Normal rate, regular rhythm, normal heart sounds and intact distal pulses.   No murmur heard. Pulses:      Radial pulses are 2+ on the right side, and 2+ on the left side.  Pulmonary/Chest: Effort normal and breath sounds normal. No respiratory distress. She has no  wheezes. She has no rales. Right breast exhibits no inverted nipple, no mass, no nipple discharge, no skin change and no tenderness. Left breast exhibits no inverted nipple, no mass, no nipple discharge, no skin change and no tenderness. Breasts are symmetrical.  Abdominal: Soft. Bowel sounds are normal. She exhibits no distension and no mass. There is no tenderness. There is no rebound and no guarding.  Musculoskeletal: Normal range of motion. She exhibits no edema.  Lymphadenopathy:    She has no cervical adenopathy.       Right axillary: No lateral adenopathy present.       Left axillary: No lateral adenopathy present.      Right: No supraclavicular adenopathy present.       Left: No supraclavicular adenopathy present.  Neurological: She is alert and oriented to person, place, and  time.       CN grossly intact, station and gait intact  Skin: Skin is warm and dry. No rash noted.  Psychiatric: She has a normal mood and affect. Her behavior is normal. Judgment and thought content normal.       Assessment & Plan:

## 2012-06-07 NOTE — Assessment & Plan Note (Signed)
With allergic conjunctivitis. Continue zyrtec, continue nasal saline. Start patanol eye drops as well as flonase.

## 2012-06-07 NOTE — Assessment & Plan Note (Signed)
Preventative protocols reviewed and updated unless pt declined Reassuring breast exam today, normal mammo this year (03/2012) Discussed healthy diet/lifestyle.

## 2012-08-04 ENCOUNTER — Other Ambulatory Visit: Payer: Self-pay | Admitting: Family Medicine

## 2012-10-03 ENCOUNTER — Other Ambulatory Visit: Payer: Self-pay | Admitting: Family Medicine

## 2012-10-17 ENCOUNTER — Other Ambulatory Visit: Payer: Self-pay | Admitting: *Deleted

## 2012-10-17 MED ORDER — MERCAPTOPURINE 50 MG PO TABS: ORAL_TABLET | ORAL | Status: DC

## 2012-11-10 ENCOUNTER — Telehealth: Payer: Self-pay | Admitting: Internal Medicine

## 2012-11-10 MED ORDER — FERROUS SULFATE 325 (65 FE) MG PO TABS: 325.0000 mg | ORAL_TABLET | Freq: Every day | ORAL | Status: DC

## 2012-11-10 NOTE — Telephone Encounter (Signed)
Patient has been taking 1 tablet of iron daily. She now has an office visit set up with Dr Olevia Perches for follow up. I will send a 1 month supply of iron to her pharmacy until her appointment with Dr Olevia Perches.

## 2012-11-11 ENCOUNTER — Encounter: Payer: Self-pay | Admitting: *Deleted

## 2012-11-18 ENCOUNTER — Other Ambulatory Visit: Payer: Self-pay | Admitting: *Deleted

## 2012-11-18 MED ORDER — MERCAPTOPURINE 50 MG PO TABS: ORAL_TABLET | ORAL | Status: DC

## 2012-11-28 ENCOUNTER — Encounter: Payer: Self-pay | Admitting: Family Medicine

## 2012-11-28 ENCOUNTER — Ambulatory Visit (INDEPENDENT_AMBULATORY_CARE_PROVIDER_SITE_OTHER): Payer: PRIVATE HEALTH INSURANCE | Admitting: Family Medicine

## 2012-11-28 VITALS — BP 122/68 | HR 72 | Temp 98.0°F | Wt 118.5 lb

## 2012-11-28 DIAGNOSIS — R21 Rash and other nonspecific skin eruption: Secondary | ICD-10-CM

## 2012-11-28 MED ORDER — HYDROXYZINE HCL 25 MG PO TABS: 25.0000 mg | ORAL_TABLET | Freq: Three times a day (TID) | ORAL | Status: DC | PRN

## 2012-11-28 MED ORDER — CLOBETASOL PROPIONATE 0.05 % EX CREA: TOPICAL_CREAM | Freq: Two times a day (BID) | CUTANEOUS | Status: DC

## 2012-11-28 NOTE — Progress Notes (Signed)
  Subjective:    Patient ID: Michelle Vasquez, female    DOB: March 26, 1951, 62 y.o.   MRN: [id number]  HPI CC: rash  Started breaking out on hands and under chin last week.  Itchy.  Noticing small pustules in hands.  So far has tried benadryl which didn't help at all.  Hydrocortisone didn't help either.  3d ago with skin rash on chest.  Never had similar rash. No new medicines, supplements. No new lotions, detergents, soaps, shampoos. No similar rash in sick contacts. Don't come and go  H/o crohn's on mercaptopurine.  Recently with ST, hoarseness and increased drainage. Takes zyrtec daily.  Past Medical History  Diagnosis Date  . HTN (hypertension)   . Crohn disease     s/p colectomy  . History of small bowel obstruction   . Chronic diarrhea   . Osteomalacia, unspecified   . Unspecified deficiency anemia   . B12 deficiency   . Internal hemorrhoids without mention of complication   . Unspecified polyarthropathy or polyarthritis, multiple sites   . Central hearing loss   . Perennial allergic rhinitis with seasonal variation   . Fatty liver 12/15/11     Review of Systems Per HPI    Objective:   Physical Exam WDWN CF NAD Mouth: no oral lesions Skin: bilateral forearms, left neck line and shoulder, and under chin with slightly erythematous macules that later develop into denuded papules, isolated lesion R forehead as well as L pinna    Assessment & Plan:

## 2012-11-28 NOTE — Assessment & Plan Note (Signed)
New rash - unclear inciting factor.  ?urticarial rash that stays and develops into papules. Will treat with course of topical steroids and hydroxyzine. If not better with this, refer to derm for further evaluation. H/o crohn's but no evidence of erythema nodosum or pyoderma gangrenosum.

## 2012-11-28 NOTE — Patient Instructions (Addendum)
I'm not sure where this rash is coming from. Treat with stronger steroid cream as well as course of hydroxyzine (similar to benadryl - so may make you sleepy). If not better with this, stop steroid cream and we will refer you to dermatologist for further evaluation. Good to see you today, call us with questions.

## 2012-12-16 ENCOUNTER — Other Ambulatory Visit (INDEPENDENT_AMBULATORY_CARE_PROVIDER_SITE_OTHER): Payer: PRIVATE HEALTH INSURANCE

## 2012-12-16 ENCOUNTER — Ambulatory Visit (INDEPENDENT_AMBULATORY_CARE_PROVIDER_SITE_OTHER): Payer: PRIVATE HEALTH INSURANCE | Admitting: Internal Medicine

## 2012-12-16 ENCOUNTER — Encounter: Payer: Self-pay | Admitting: Internal Medicine

## 2012-12-16 VITALS — BP 116/70 | HR 76 | Ht 60.0 in | Wt 118.8 lb

## 2012-12-16 DIAGNOSIS — K56609 Unspecified intestinal obstruction, unspecified as to partial versus complete obstruction: Secondary | ICD-10-CM

## 2012-12-16 DIAGNOSIS — K5090 Crohn's disease, unspecified, without complications: Secondary | ICD-10-CM

## 2012-12-16 LAB — CBC WITH DIFFERENTIAL/PLATELET
Eosinophils Relative: 1.7 % (ref 0.0–5.0)
HCT: 36.4 % (ref 36.0–46.0)
Hemoglobin: 12.1 g/dL (ref 12.0–15.0)
Lymphs Abs: 2 10*3/uL (ref 0.7–4.0)
MCV: 97.3 fl (ref 78.0–100.0)
Monocytes Absolute: 0.4 10*3/uL (ref 0.1–1.0)
Monocytes Relative: 7.7 % (ref 3.0–12.0)
Neutro Abs: 3 10*3/uL (ref 1.4–7.7)
Platelets: 265 10*3/uL (ref 150.0–400.0)
RDW: 14.5 % (ref 11.5–14.6)
WBC: 5.6 10*3/uL (ref 4.5–10.5)

## 2012-12-16 LAB — IBC PANEL: Iron: 86 ug/dL (ref 42–145)

## 2012-12-16 LAB — SEDIMENTATION RATE: Sed Rate: 17 mm/hr (ref 0–22)

## 2012-12-16 LAB — VITAMIN B12: Vitamin B-12: 451 pg/mL (ref 211–911)

## 2012-12-16 MED ORDER — OMEPRAZOLE 20 MG PO CPDR: 20.0000 mg | DELAYED_RELEASE_CAPSULE | Freq: Every day | ORAL | Status: DC

## 2012-12-16 MED ORDER — MERCAPTOPURINE 50 MG PO TABS: ORAL_TABLET | ORAL | Status: DC

## 2012-12-16 MED ORDER — FERROUS SULFATE 325 (65 FE) MG PO TABS: 325.0000 mg | ORAL_TABLET | Freq: Every day | ORAL | Status: DC

## 2012-12-16 NOTE — Patient Instructions (Addendum)
We have sent the following medications to your pharmacy for you to pick up at your convenience: Omeprazole 20m Iron  Your physician has requested that you go to the basement for the following lab work before leaving today: B12, IBC, CBC, Sed Rate  We will check your B12 level today. If it is abnormal, we will send a prescription to your pharmacy for injectable b12.  CC: Dr JRia Bush

## 2012-12-16 NOTE — Progress Notes (Signed)
Michelle Vasquez 10/28/1950 MRN [id number]  History of Present Illness:  This is a 62 year old white female with Crohn's disease of the terminal ileum. She had a terminal ileal resection in 1981. She had a diagnosis of Crohn's disease at the age 69. She was hospitalized for a small bowel obstruction in 2008 which resolved on bowel rest. She is on B12 injections monthly and iron supplements. She has gastroesophageal reflux controlled on Prilosec 20 mg daily. She has no complaints today other than low back pain. She has occasional chronic diarrhea. Her last colonoscopy in May 2011 showed ileitis with a 50% narrowing of the anastomosis. She has been on 6 mercaptopurine and 50 mg daily.   Past Medical History  Diagnosis Date  . HTN (hypertension)   . Crohn disease     s/p colectomy  . History of small bowel obstruction   . Osteomalacia, unspecified   . Unspecified deficiency anemia   . B12 deficiency   . Internal hemorrhoids without mention of complication   . Unspecified polyarthropathy or polyarthritis, multiple sites   . Central hearing loss   . Perennial allergic rhinitis with seasonal variation   . Fatty liver 12/15/11   Past Surgical History  Procedure Laterality Date  . Total abdominal hysterectomy  1975    ovaries out as well  . Appendectomy    . Other surgical history  1960s, 1978, 1980    ileal resection x 3 and subtotal colon resection  . Esophagogastroduodenoscopy  02/25/05    Dilated stricture  . Colonoscopy  02/25/05    Internal hemms; crohns; narrow anast  . Colonoscopy  02/26/10    Prior right hemicolectomy o/w benign (Dr. Olevia Perches)  . Hemicolectomy  1980    all small intestine, none large  . Dexa  07/06/2011    normal T score -1.0 femur, spine    reports that she quit smoking about 34 years ago. She has never used smokeless tobacco. She reports that she does not drink alcohol or use illicit drugs. family history includes Alcohol abuse in her brother; Cirrhosis in her  brother; Crohn's disease in her father, sister, and unspecified family members; Heart disease (age of onset: 57) in her mother; and Hypertension in her mother.  There is no history of Colon cancer, and Cancer, and Diabetes, . No Known Allergies      Review of Systems: Denies heartburn dysphagia abdominal pain weight loss  The remainder of the 10 point ROS is negative except as outlined in H&P   Physical Exam: General appearance  Well developed, in no distress. Eyes- non icteric. HEENT nontraumatic, normocephalic. Mouth no lesions, tongue papillated, no cheilosis. Neck supple without adenopathy, thyroid not enlarged, no carotid bruits, no JVD. Lungs Clear to auscultation bilaterally. Cor normal S1, normal S2, regular rhythm, no murmur,  quiet precordium. Abdomen: Soft nontender with normoactive bowel sounds. Well-healed surgical scar. Liver edge at costal margin. Rectal: Normal rectal sphincter tone. Black Hemoccult negative stool. Extremities no pedal edema. Skin no lesions. Neurological alert and oriented x 3. Psychological normal mood and affect.  Assessment and Plan:  Problem #1 Crohn's disease at the ileocolic anastomosis as of her last colonoscopy 3 years ago. She will continue on 6-MP 50 mg daily. We will recheck her chemistries today including B12 and iron levels. We will refill her 6-MP. We will see her in one year. If her low back pain continues, I would recommend a KUB to rule out sacroiliitis.   12/16/2012 [name]

## 2013-01-03 ENCOUNTER — Telehealth: Payer: Self-pay | Admitting: *Deleted

## 2013-01-03 NOTE — Telephone Encounter (Signed)
Left message for patient to call back  

## 2013-01-03 NOTE — Telephone Encounter (Signed)
Oral B12 1000ug, 1  po qd, please refill,

## 2013-01-03 NOTE — Telephone Encounter (Signed)
Spoke with patient and advised that their is a Psychologist, prison and probation services on b12 injectable solution at this time. She needs to take an oral b12 1000 mcg supplement daily. She is to call us back in 1 month to see where we stand as far as the backorder. She verbalizes understanding.

## 2013-01-03 NOTE — Telephone Encounter (Signed)
Dr Olevia Perches, patient requests refills on vitamin b12 injectable medication. However, her last b12 in 11/2012 showed her to have a b12 level of 451. Does she need to be on oral supplementation until national backorder of injectable b12 is available or is she okay to do without for now?

## 2013-01-16 ENCOUNTER — Encounter: Payer: Self-pay | Admitting: Physician Assistant

## 2013-01-16 ENCOUNTER — Inpatient Hospital Stay (HOSPITAL_COMMUNITY)
Admission: AD | Admit: 2013-01-16 | Discharge: 2013-01-18 | DRG: 392 | Disposition: A | Discharge status: Home / Self Care | Payer: PRIVATE HEALTH INSURANCE | Source: Ambulatory Visit | Attending: Internal Medicine | Admitting: Internal Medicine

## 2013-01-16 ENCOUNTER — Telehealth: Payer: Self-pay | Admitting: Internal Medicine

## 2013-01-16 ENCOUNTER — Inpatient Hospital Stay (HOSPITAL_COMMUNITY): Payer: PRIVATE HEALTH INSURANCE

## 2013-01-16 ENCOUNTER — Ambulatory Visit (INDEPENDENT_AMBULATORY_CARE_PROVIDER_SITE_OTHER): Payer: PRIVATE HEALTH INSURANCE | Admitting: Physician Assistant

## 2013-01-16 ENCOUNTER — Encounter (HOSPITAL_COMMUNITY): Payer: Self-pay | Admitting: Oncology

## 2013-01-16 VITALS — BP 164/82 | HR 76 | Temp 99.0°F | Ht 60.0 in | Wt 117.0 lb

## 2013-01-16 DIAGNOSIS — Z9049 Acquired absence of other specified parts of digestive tract: Secondary | ICD-10-CM

## 2013-01-16 DIAGNOSIS — M130 Polyarthritis, unspecified: Secondary | ICD-10-CM | POA: Diagnosis present

## 2013-01-16 DIAGNOSIS — R112 Nausea with vomiting, unspecified: Secondary | ICD-10-CM | POA: Diagnosis present

## 2013-01-16 DIAGNOSIS — R110 Nausea: Secondary | ICD-10-CM | POA: Diagnosis present

## 2013-01-16 DIAGNOSIS — K5090 Crohn's disease, unspecified, without complications: Secondary | ICD-10-CM | POA: Diagnosis present

## 2013-01-16 DIAGNOSIS — I10 Essential (primary) hypertension: Secondary | ICD-10-CM | POA: Diagnosis present

## 2013-01-16 DIAGNOSIS — R197 Diarrhea, unspecified: Secondary | ICD-10-CM

## 2013-01-16 DIAGNOSIS — Z79899 Other long term (current) drug therapy: Secondary | ICD-10-CM

## 2013-01-16 DIAGNOSIS — R142 Eructation: Secondary | ICD-10-CM | POA: Diagnosis present

## 2013-01-16 DIAGNOSIS — R7402 Elevation of levels of lactic acid dehydrogenase (LDH): Secondary | ICD-10-CM | POA: Diagnosis present

## 2013-01-16 DIAGNOSIS — E538 Deficiency of other specified B group vitamins: Secondary | ICD-10-CM | POA: Diagnosis present

## 2013-01-16 DIAGNOSIS — K5000 Crohn's disease of small intestine without complications: Secondary | ICD-10-CM

## 2013-01-16 DIAGNOSIS — R141 Gas pain: Secondary | ICD-10-CM | POA: Diagnosis present

## 2013-01-16 DIAGNOSIS — R7401 Elevation of levels of liver transaminase levels: Secondary | ICD-10-CM | POA: Diagnosis present

## 2013-01-16 LAB — COMPREHENSIVE METABOLIC PANEL
ALT: 60 U/L — ABNORMAL HIGH (ref 0–35)
CO2: 28 mEq/L (ref 19–32)
Calcium: 9.9 mg/dL (ref 8.4–10.5)
Chloride: 102 mEq/L (ref 96–112)
Creatinine, Ser: 0.71 mg/dL (ref 0.50–1.10)
GFR calc Af Amer: >90 mL/min (ref >90)
GFR calc non Af Amer: >90 mL/min (ref >90)
Glucose, Bld: 86 mg/dL (ref 70–99)
Sodium: 140 mEq/L (ref 135–145)
Total Bilirubin: 0.7 mg/dL (ref 0.3–1.2)

## 2013-01-16 LAB — CBC WITH DIFFERENTIAL/PLATELET
Basophils Absolute: 0 10*3/uL (ref 0.0–0.1)
Basophils Relative: 1 % (ref 0–1)
Eosinophils Absolute: 0.1 10*3/uL (ref 0.0–0.7)
Eosinophils Relative: 2 % (ref 0–5)
HCT: 39.9 % (ref 36.0–46.0)
MCH: 32.4 pg (ref 26.0–34.0)
MCHC: 33.3 g/dL (ref 30.0–36.0)
MCV: 97.1 fL (ref 78.0–100.0)
Monocytes Absolute: 0.5 10*3/uL (ref 0.1–1.0)
Monocytes Relative: 9 % (ref 3–12)
Neutro Abs: 3.3 10*3/uL (ref 1.7–7.7)
RDW: 13.8 % (ref 11.5–15.5)

## 2013-01-16 MED ORDER — KCL IN DEXTROSE-NACL 10-5-0.45 MEQ/L-%-% IV SOLN
INTRAVENOUS | Status: DC
Start: 2013-01-16 — End: 2013-01-18
  Administered 2013-01-16 – 2013-01-17 (×5): via INTRAVENOUS
  Filled 2013-01-16 (×7): qty 1000

## 2013-01-16 MED ORDER — ACETAMINOPHEN 650 MG RE SUPP: 650.0000 mg | Freq: Four times a day (QID) | RECTAL | Status: DC | PRN

## 2013-01-16 MED ORDER — AMLODIPINE BESYLATE 5 MG PO TABS
5.0000 mg | ORAL_TABLET | Freq: Every day | ORAL | Status: DC
  Administered 2013-01-16 – 2013-01-18 (×3): 5 mg via ORAL
  Filled 2013-01-16 (×3): qty 1

## 2013-01-16 MED ORDER — FLUTICASONE PROPIONATE 50 MCG/ACT NA SUSP
1.0000 | Freq: Every day | NASAL | Status: DC
  Administered 2013-01-16: 1 via NASAL
  Filled 2013-01-16: qty 16

## 2013-01-16 MED ORDER — PANTOPRAZOLE SODIUM 40 MG PO TBEC
40.0000 mg | DELAYED_RELEASE_TABLET | Freq: Every day | ORAL | Status: DC
  Administered 2013-01-16 – 2013-01-18 (×3): 40 mg via ORAL
  Filled 2013-01-16 (×3): qty 1

## 2013-01-16 MED ORDER — ACETAMINOPHEN 325 MG PO TABS: 650.0000 mg | ORAL_TABLET | Freq: Four times a day (QID) | ORAL | Status: DC | PRN

## 2013-01-16 MED ORDER — HYDROMORPHONE HCL PF 1 MG/ML IJ SOLN
0.5000 mg | INTRAMUSCULAR | Status: DC | PRN
  Filled 2013-01-16: qty 1

## 2013-01-16 MED ORDER — ENOXAPARIN SODIUM 40 MG/0.4ML ~~LOC~~ SOLN
40.0000 mg | SUBCUTANEOUS | Status: DC
  Administered 2013-01-16 – 2013-01-17 (×2): 40 mg via SUBCUTANEOUS
  Filled 2013-01-16 (×3): qty 0.4

## 2013-01-16 MED ORDER — LORATADINE 10 MG PO TABS
10.0000 mg | ORAL_TABLET | Freq: Every day | ORAL | Status: DC
  Administered 2013-01-16 – 2013-01-18 (×3): 10 mg via ORAL
  Filled 2013-01-16 (×3): qty 1

## 2013-01-16 MED ORDER — CITALOPRAM HYDROBROMIDE 10 MG PO TABS
10.0000 mg | ORAL_TABLET | Freq: Every day | ORAL | Status: DC
  Administered 2013-01-16 – 2013-01-18 (×3): 10 mg via ORAL
  Filled 2013-01-16 (×3): qty 1

## 2013-01-16 MED ORDER — HYDROCODONE-ACETAMINOPHEN 5-325 MG PO TABS
1.0000 | ORAL_TABLET | ORAL | Status: DC | PRN
  Administered 2013-01-16 – 2013-01-17 (×4): 1 via ORAL
  Filled 2013-01-16: qty 1
  Filled 2013-01-16: qty 2
  Filled 2013-01-16 (×2): qty 1

## 2013-01-16 MED ORDER — ONDANSETRON HCL 4 MG PO TABS: 4.0000 mg | ORAL_TABLET | Freq: Four times a day (QID) | ORAL | Status: DC | PRN

## 2013-01-16 MED ORDER — ONDANSETRON HCL 4 MG/2ML IJ SOLN: 4.0000 mg | Freq: Four times a day (QID) | INTRAMUSCULAR | Status: DC | PRN

## 2013-01-16 MED ORDER — HYDROXYZINE HCL 25 MG PO TABS
25.0000 mg | ORAL_TABLET | Freq: Three times a day (TID) | ORAL | Status: DC | PRN
  Filled 2013-01-16: qty 1

## 2013-01-16 MED ORDER — METHYLPREDNISOLONE SODIUM SUCC 40 MG IJ SOLR
40.0000 mg | INTRAMUSCULAR | Status: DC
  Administered 2013-01-16 – 2013-01-17 (×2): 40 mg via INTRAVENOUS
  Filled 2013-01-16 (×3): qty 1

## 2013-01-16 MED ORDER — OLOPATADINE HCL 0.1 % OP SOLN
1.0000 [drp] | Freq: Two times a day (BID) | OPHTHALMIC | Status: DC
  Filled 2013-01-16: qty 5

## 2013-01-16 MED ORDER — MERCAPTOPURINE 50 MG PO TABS
1.5000 mg/kg | ORAL_TABLET | Freq: Every day | ORAL | Status: DC
  Administered 2013-01-16 – 2013-01-17 (×2): 100 mg via ORAL
  Filled 2013-01-16 (×3): qty 2

## 2013-01-16 NOTE — H&P (Addendum)
Please refer to the History and Physical as written by Nicoletta Ba, PA-C.  I have seen and evaluated the patient with Ms. Trellis Paganini in the office and the decision to admit for evaluation and treatment of suspected partial SBO/Crohn's flare was made. She could also have C diff infection and we will evaluate for that.  Small bowel xrays do not show any signs of obstruction. Labs pending at this time. I think we can reassess in AM prior to getting CT and will cancel that  for now - if C diff PCR + may not need the CT Start solumedrol empirically at this time also   Gatha Mayer, MD, [facility] [phone] (pager) 01/16/2013 5:11 PM

## 2013-01-16 NOTE — Progress Notes (Signed)
Pt came up to floor as a direct admission. Pt is in NAD, has no SOB/dyspnea, and is complaining of some abdominal discomfort. Will continue to monitor.

## 2013-01-16 NOTE — Progress Notes (Signed)
Patient ID: Michelle Vasquez, female   DOB: 1951-05-04, 62 y.o.   MRN: [id number]  Michelle Vasquez is seen and evaluated in the office today for three-week history of progressive symptoms with nausea vomiting abdominal pain cramping bloating and now progressively severe diarrhea with up to 15 bowel movements per day. She has been unable to eat or drink over the past 3 days and feels weak and lightheaded. He has a long history of Crohn's disease is status post right hemicolectomy and terminal ileal resection remotely and has a known anastomotic stricture as well as active Crohn's ileitis at the time of last colonoscopy 2011. She is maintained on 6-MP 50 mg by mouth daily She is acutely ill and will be admitted to [facility] today for rehydration, supportive management and further diagnostic evaluation. For details please see the dictated H&P

## 2013-01-16 NOTE — H&P (Signed)
Primary Care Physician:  [name], MD Primary Gastroenterologist:  Dr. [name]  CHIEF COMPLAINT:  Nausea vomiting progressive diarrhea and weakness with abdominal pain  HPI: Michelle Vasquez is a 62 y.o. female known to Dr. Olevia Perches with a long history of Crohn's disease. She is status post remote terminal ileal resection in 1981. She had a partial small bowel obstruction several years ago which resolved with conservative management. He has really been doing well over the past several years on maintenance therapy with 6-MP 50 mg by mouth daily and has been relatively asymptomatic. She says occasionally she'll have a "flare up" with abdominal discomfort cramping and diarrhea which may last for a day or 2. She says at this point she has been sick over the past 3 weeks initially with onset of nausea and abdominal bloating followed by irregular bowel habits with no bowel movement for a day or 2 followed by several diarrheal stools.  This has gradually progressed and now over the past 3 days she says she's having constant diarrhea with up to 15 bowel movements per day. Her stools are watery and nonbloody. She says she really does not want to eat  or drink anything because it causes immediate abdominal pain and cramping. She really has not been able to keep down any food or fluid over the past 3 days and has been in bed most of that time. She says she feels weak and somewhat lightheaded with walking. She has had some low-grade temps  at home in the 100 range. She has not had any known sick exposures, no recent antibiotics, and has remained on her 6-MP. Last colonoscopy was done in 2011 and at that time she did have about a 50% narrowing/stricture of her terminal ileal anastomosis and biopsies were positive for chronic active ileitis.  Past Medical History  Diagnosis Date  . HTN (hypertension)   . Crohn disease     s/p colectomy  . History of small bowel obstruction   . Osteomalacia, unspecified    . Unspecified deficiency anemia   . B12 deficiency   . Internal hemorrhoids without mention of complication   . Unspecified polyarthropathy or polyarthritis, multiple sites   . Central hearing loss   . Perennial allergic rhinitis with seasonal variation   . Fatty liver 12/15/11    Past Surgical History  Procedure Laterality Date  . Total abdominal hysterectomy  1975    ovaries out as well  . Appendectomy    . Other surgical history  1960s, 1978, 1980    ileal resection x 3 and subtotal colon resection  . Esophagogastroduodenoscopy  02/25/05    Dilated stricture  . Colonoscopy  02/25/05    Internal hemms; crohns; narrow anast  . Colonoscopy  02/26/10    Prior right hemicolectomy o/w benign (Dr. Olevia Perches)  . Hemicolectomy  1980    all small intestine, none large  . Dexa  07/06/2011    normal T score -1.0 femur, spine    Prior to Admission medications   Medication Sig Start Date End Date Taking? Authorizing Provider  amLODipine (NORVASC) 5 MG tablet TAKE ONE (1) TABLET BY MOUTH EVERY      DAY 10/03/12  Yes [name], MD  cetirizine (ZYRTEC) 10 MG tablet Take 10 mg by mouth daily.   Yes Historical Provider, MD  citalopram (CELEXA) 10 MG tablet TAKE ONE (1) TABLET BY MOUTH EVERY      DAY 08/04/12  Yes [name], MD  clobetasol cream (TEMOVATE)  0.05 % Apply topically 2 (two) times daily. Apply to AA 11/28/12  Yes [name], MD  cyanocobalamin (,VITAMIN B-12,) 1000 MCG/ML injection Inject 1 mL (1,000 mcg total) into the muscle every 30 (thirty) days. 05/30/12  Yes Lafayette Dragon, MD  ferrous sulfate 325 (65 FE) MG tablet Take 1 tablet (325 mg total) by mouth daily with breakfast. 12/16/12  Yes Lafayette Dragon, MD  fluticasone [facility]) 50 MCG/ACT nasal spray Place 2 sprays into the nose daily. 06/07/12 06/07/13 Yes [name], MD  hydrOXYzine (ATARAX/VISTARIL) 25 MG tablet Take 1 tablet (25 mg total) by mouth 3 (three) times daily as needed for itching. 11/28/12  Yes [name], MD  mercaptopurine (PURINETHOL) 50 MG tablet Take 1 tablet by mouth once daily 12/16/12  Yes Lafayette Dragon, MD  olopatadine (PATANOL) 0.1 % ophthalmic solution Place 1 drop into both eyes 2 (two) times daily. 06/07/12 06/07/13 Yes [name], MD  omeprazole (PRILOSEC) 20 MG capsule Take 1 capsule (20 mg total) by mouth daily. 12/16/12 12/16/13 Yes Lafayette Dragon, MD    Current Outpatient Prescriptions  Medication Sig Dispense Refill  . amLODipine (NORVASC) 5 MG tablet TAKE ONE (1) TABLET BY MOUTH EVERY      DAY  30 tablet  6  . cetirizine (ZYRTEC) 10 MG tablet Take 10 mg by mouth daily.      . citalopram (CELEXA) 10 MG tablet TAKE ONE (1) TABLET BY MOUTH EVERY      DAY  30 tablet  6  . clobetasol cream (TEMOVATE) 0.05 % Apply topically 2 (two) times daily. Apply to AA  30 g  0  . cyanocobalamin (,VITAMIN B-12,) 1000 MCG/ML injection Inject 1 mL (1,000 mcg total) into the muscle every 30 (thirty) days.  3 mL  1  . ferrous sulfate 325 (65 FE) MG tablet Take 1 tablet (325 mg total) by mouth daily with breakfast.  30 tablet  2  . fluticasone (FLONASE) 50 MCG/ACT nasal spray Place 2 sprays into the nose daily.  16 g  3  . hydrOXYzine (ATARAX/VISTARIL) 25 MG tablet Take 1 tablet (25 mg total) by mouth 3 (three) times daily as needed for itching.  30 tablet  0  . mercaptopurine (PURINETHOL) 50 MG tablet Take 1 tablet by mouth once daily  30 tablet  4  . olopatadine (PATANOL) 0.1 % ophthalmic solution Place 1 drop into both eyes 2 (two) times daily.  5 mL  12  . omeprazole (PRILOSEC) 20 MG capsule Take 1 capsule (20 mg total) by mouth daily.  30 capsule  4   No current facility-administered medications for this visit.    Allergies as of 01/16/2013  . (No Known Allergies)    Family History  Problem Relation Age of Onset  . Crohn's disease Father     colostomy  . Hypertension Mother   . Heart disease Mother 75  . Alcohol abuse Brother   . Cirrhosis Brother   . Crohn's disease  Sister   . Crohn's disease      Aunts x 3  . Crohn's disease      neice x 2  . Colon cancer Neg Hx   . Cancer Neg Hx   . Diabetes Neg Hx     History   Social History  . Marital Status: Divorced    Spouse Name: N/A    Number of Children: 3  . Years of Education: N/A   Occupational History  . Medical Transcription Other  Social History Main Topics  . Smoking status: Former Smoker    Quit date: 10/26/1978  . Smokeless tobacco: Never Used  . Alcohol Use: No  . Drug Use: No  . Sexually Active: Not on file   Other Topics Concern  . 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  All other review of systems was otherwise negative.  Physical Exam: Vital signs in last 24 hours: @VSRANGES @   General:   Alert,  well-developed, ill-appearing white female in no acute distress lying on the exam table. Blood pressure 164/82 pulse 76 temp 99 height 5 foot weight 117 Head:  Normocephalic and atraumatic. Eyes:  Sclera clear, no icterus.   Conjunctiva pink. Ears:  Normal auditory acuity. Mouth:  No deformity or lesions.  Oropharynx dry Neck:  Supple; no masses or thyromegaly. Lungs:  Clear throughout to auscultation.   No wheezes, crackles, or rhonchi. No acute distress. Heart:  Regular rate and rhythm; no murmurs. Abdomen:  Soft, mildly distended bowel sounds are somewhat hyperactive, she's rather diffusely tender, more so in the right mid and right lower abdomen some guarding no rebound no palpable mass or hepatosplenomegaly she is midline incisional scar   Rectal not done  Msk:  Symmetrical without gross deformities. Normal posture. Pulses:  Normal pulses noted. Extremities:  Without  edema. Neurologic:  Alert and  oriented x4;  grossly normal neurologically. Skin:  Intact without significant lesions or rashes. Cervical Nodes:  No significant cervical adenopathy. Psych:  Alert and cooperative. Normal mood and affect.  Lab Results: No results found for this basename: WBC, HGB, HCT, PLT,  in the last 72 hours BMET No results found for this basename: NA, K, CL, CO2, GLUCOSE, BUN, CREATININE, CALCIUM,  in the last 72 hours LFT No results found for this basename: PROT, ALBUMIN, AST, ALT, ALKPHOS, BILITOT, BILIDIR, IBILI,  in the last 72 hours PT/INR No results found for this basename: LABPROT, INR,  in the last 72 hours Hepatitis Panel No results found for this basename: HEPBSAG, HCVAB, HEPAIGM, HEPBIGM,  in the last 72 hours  Studies/Results: No results found.  Impression / Plan:   #54  62 year old female with long history of Crohn's disease status post remote right hemicolectomy and terminal ileal resection with known narrowing at her anastomosis and active ileitis at the time of last colonoscopy 2011 .Marland Kitchen Patient maintained on 6 MP. She presents now with progressive symptoms over the past 3 weeks with nausea abdominal bloating cramping pain and progressively severe.diarrhea fatigue and low-grade fever. Rule out active Crohn's ileitis with partial small bowel obstruction, rule out superimposed infectious gastroenteritis. Rule out C. Difficile. #2 dehydration secondary to above #3 history of B12 deficiency on replacement #4 hypertension #5 polyarthritis     Plan ; patient is admitted to the GI service for IV fluid rehydration, anti-emetics, pain control.   Will obtain plain films today and plan for CT of the abdomen and pelvis tomorrow   continue 6-MP 50 mg by mouth daily Baseline labs and stool for lactoferrin and C. difficile today Further plans pending results of abdominal imaging and stool studies     @RRHLOS @  Amy Esterwood  01/16/2013, 3:47 PM

## 2013-01-16 NOTE — Telephone Encounter (Signed)
Spoke with patient and she reports she has had diarrhea, bloating all weekend. States she cannot eat or even drink water without diarrhea. Denies bleeding. States she has had bloating for the last 2 weeks also. Scheduled with Nicoletta Ba, PA today at 3:00 PM.

## 2013-01-16 NOTE — Patient Instructions (Signed)
Go to admitting, first floor of [facility] will be in the [place] section of the hospital.

## 2013-01-17 ENCOUNTER — Encounter (HOSPITAL_COMMUNITY): Payer: Self-pay | Admitting: Radiology

## 2013-01-17 ENCOUNTER — Inpatient Hospital Stay (HOSPITAL_COMMUNITY): Payer: PRIVATE HEALTH INSURANCE

## 2013-01-17 LAB — CLOSTRIDIUM DIFFICILE BY PCR: Toxigenic C. Difficile by PCR: NEGATIVE

## 2013-01-17 LAB — C-REACTIVE PROTEIN: CRP: <0.5 mg/dL — ABNORMAL LOW (ref <0.60)

## 2013-01-17 MED ORDER — IOHEXOL 300 MG/ML  SOLN
100.0000 mL | Freq: Once | INTRAMUSCULAR | Status: AC | PRN
  Administered 2013-01-17: 100 mL via INTRAVENOUS

## 2013-01-17 MED ORDER — IOHEXOL 300 MG/ML  SOLN
25.0000 mL | INTRAMUSCULAR | Status: AC
  Administered 2013-01-17 (×2): 25 mL via ORAL

## 2013-01-17 NOTE — Progress Notes (Signed)
[facility] Progress Note  SUBJECTIVE: feels better today. Still nauseated and having cramps but no vomiting and no diarrhea.   OBJECTIVE:  Vital signs in last 24 hours: Temp:  [97.4 F (36.3 C)-99 F (37.2 C)] 98.4 F (36.9 C) (03/25 0544) Pulse Rate:  [63-76] 67 (03/25 0544) Resp:  [18-20] 20 (03/25 0544) BP: (114-164)/(60-88) 114/88 mmHg (03/25 0544) SpO2:  [97 %-99 %] 97 % (03/25 0544) Weight:  [117 lb (53.071 kg)-118 lb 2.7 oz (53.6 kg)] 118 lb 2.7 oz (53.6 kg) (03/24 1609) Last BM Date: 01/16/13 General:    white female in NAD Heart:  Regular rate and rhythm Abdomen:  Soft, mildly distended without tympany. Mild to moderate diffuse upper abdominal tenderness.  Extremities:  Without edema. Neurologic:  Alert and oriented,  grossly normal neurologically. Psych:  Cooperative. Normal mood and affect.   Lab Results:  Recent Labs  01/16/13 1635  WBC 6.3  HGB 13.3  HCT 39.9  PLT 271   BMET  Recent Labs  01/16/13 1635  NA 140  K 3.5  CL 102  CO2 28  GLUCOSE 86  BUN 8  CREATININE 0.71  CALCIUM 9.9   LFT  Recent Labs  01/16/13 1635  PROT 8.0  ALBUMIN 4.2  AST 38*  ALT 60*  ALKPHOS 77  BILITOT 0.7   PT/INR  Recent Labs  01/16/13 1635  LABPROT 11.9  INR 0.88    Studies/Results: Acute Abdominal Series  01/16/2013  *RADIOLOGY REPORT*  Clinical Data: Crohn's disease.  Abdominal pain with nausea, vomiting, and diarrhea.  ACUTE ABDOMEN SERIES (ABDOMEN 2 VIEW & CHEST 1 VIEW)  Comparison: Radiograph dated 08/19/2010  Findings: The heart size and pulmonary vascularity are normal and the lungs are clear.  No free air in the abdomen.  There are no dilated loops of large or small bowel.  There is air throughout the nondistended colon. Small air-fluid levels in the ascending colon.  Thoracolumbar scoliosis, unchanged.  IMPRESSION: Benign-appearing abdomen.  No evidence of bowel obstruction.   Original Report Authenticated By: Lorriane Shire, M.D.       ASSESSMENT / PLAN:  1. Long standing crohn's s/p remote right hemicolectomy and terminal ileal resection with known narrowing and active ileitis at time of last colonoscopy 2011. Patient has been maintained on 63m. Admitted yesterday with nausea, vomiting, abdominal pain and diarrhea. Admission CBC normal.  Electrolytes and renal function normal. No further vomiting or diarrhea but still with cramps and nausea. Abdominal films yesterday negative for obstruction. Oral intake exacerbates the pain. Rule out crohn's flare. Rule out C-diff though diarrhea has abated now.   Continue [id number], solumedrol, clears liquids, IVF at 125m93mhr and supportive care.   Patient feels she can tolerate contrast for CTscan so will get that ordered.   She is at high risk for DVTs. On Lovenox, encouraged use of SCDs.     2. history of B12 deficiency, probably from terminal ileal resection, on replacement   3. Polyarthritis  4. HTN, stable. On Norvasc    LOS: 1 day   PaulTye Savoy25/2014, 10:51 AM   GI ATTENDING  Patient seen and partially examined. Interval history and laboratories reviewed. X-rays reviewed. Long-standing Crohn's with repeat surgeries. Has not been in the hospital for some time. Admitted with abdominal pain, nausea, vomiting, and diarrhea. She has had no diarrhea or vomiting since admission. Abdominal discomfort has improved, though still with some. Testing for Clostridium difficile pending. Agree with plans to move forward  with CT imaging. If imaging negative, then advance diet as tolerated with hopes for discharge soon. Discussed with patient.  Docia Chuck. Geri Seminole., M.D. [facility] Division of Gastroenterology

## 2013-01-18 NOTE — Discharge Summary (Signed)
[facility] Discharge Summary  Name: Michelle Vasquez MRN: [id number] DOB: 1950/12/01 62 y.o. PCP:  [name], MD  Date of Admission: 01/16/2013  3:49 PM Date of Discharge: 01/18/2013 Primary Gastroenterologist: [name], MD Discharging Physician: Scarlette Shorts, MD  Discharge Diagnosis: 1. Nausea, bloating, crampy diarrhea. Resolving.   2. B12 deficiency. On replacement therapy  3. HTN, stable.   4. Mild transaminitis, new. Workup if indicated as outpatient  Consultations: none  Procedures Performed:  Ct Abdomen Pelvis W Contrast  01/17/2013  *RADIOLOGY REPORT*  Clinical Data:  Abdominal pain, Crohn's disease, nausea  CT ABDOMEN AND PELVIS WITH CONTRAST  Technique:  Multidetector CT imaging of the abdomen and pelvis was performed following the standard protocol during bolus administration of intravenous contrast.  Contrast: 194m OMNIPAQUE IOHEXOL 300 MG/ML  SOLN  Comparison: 11/10/2007.  Findings: Sagittal images of the spine are unremarkable.  Lung bases are unremarkable.  Liver shows no biliary ductal dilatation.  Mild distended gallbladder without evidence of calcified gallstones.  The pancreas, spleen and adrenal glands are unremarkable.  Kidneys are symmetrical in size and enhancement.  No hydronephrosis or hydroureter.  Delayed renal images shows bilateral renal symmetrical excretion. Mild dilatation of the right renal pelvis without change from prior exam.  No small bowel obstruction.  No ascites or free air.  No adenopathy.  The abdominal aorta is unremarkable.  The patient is status post appendectomy.  No pericecal inflammation.  The patient is status post hysterectomy.  The urinary bladder is unremarkable. Nonspecific mild thickening of the terminal ileal wall without surrounding inflammatory changes.  IMPRESSION:  1.  No acute inflammatory process within abdomen or pelvis. 2.  Mild distended gallbladder without evidence of calcified gallstones. 3.  Status post  appendectomy.  Status post hysterectomy. 4.  No small bowel obstruction.   Original Report Authenticated By: LLahoma Crocker M.D.    Acute Abdominal Series  01/16/2013  *RADIOLOGY REPORT*  Clinical Data: Crohn's disease.  Abdominal pain with nausea, vomiting, and diarrhea.  ACUTE ABDOMEN SERIES (ABDOMEN 2 VIEW & CHEST 1 VIEW)  Comparison: Radiograph dated 08/19/2010  Findings: The heart size and pulmonary vascularity are normal and the lungs are clear.  No free air in the abdomen.  There are no dilated loops of large or small bowel.  There is air throughout the nondistended colon. Small air-fluid levels in the ascending colon.  Thoracolumbar scoliosis, unchanged.  IMPRESSION: Benign-appearing abdomen.  No evidence of bowel obstruction.   Original Report Authenticated By: JLorriane Shire M.D.     GI Procedures: none  History/Physical Exam:  See Admission H&P  Admission HPI: Michelle BAUCHis a 62y.o. female known to Dr. BOlevia Percheswith a long history of Crohn's disease. She is status post remote terminal ileal resection in 1981. She had a partial small bowel obstruction several years ago which resolved with conservative management. He has really been doing well over the past several years on maintenance therapy with 6-MP 50 mg by mouth daily and has been relatively asymptomatic. She says occasionally she'll have a "flare up" with abdominal discomfort cramping and diarrhea which may last for a day or 2. She says at this point she has been sick over the past 3 weeks initially with onset of nausea and abdominal bloating followed by irregular bowel habits with no bowel movement for a day or 2 followed by several diarrheal stools. This has gradually progressed and now over the past 3 days she says she's having constant diarrhea with up to  15 bowel movements per day. Her stools are watery and nonbloody. She says she really does not want to eat or drink anything because it causes immediate abdominal pain and cramping. She  really has not been able to keep down any food or fluid over the past 3 days and has been in bed most of that time. She says she feels weak and somewhat lightheaded with walking. She has had some low-grade temps at home in the 100 range. She has not had any known sick exposures, no recent antibiotics, and has remained on her 6-MP.  Last colonoscopy was done in 2011 and at that time she did have about a 50% narrowing/stricture of her terminal ileal anastomosis and biopsies were positive for chronic active ileitis.   Hospital Course by problem list:  1. Nausea, bloating, crampy diarrhea. Patient with longstanding crohn's disease. She was a direct admit from our office 01/16/13 for above symptoms.  Upon admission patient was started on IVF and IV steroids. Her home 11m was continued. Admission labs were unremarkable except for mild transaminitis. Two view abdominal films were negative. CT scan of the abdomen and pelvis with contrast revealed only some mild, nonspecific thickening of the terminal ileum without inflammatory changes. Gallbladder mildly distended without gallstones. Ms. BMinichactually felt better by the second day of her hospital day. She was still nauseated with some abdominal discomfort but diarrhea and vomiting had resolved. By the third day of admission patient was tolerating a full liquid diet and expressed interest in going home. Her stool was negative for C-diff. Etiology of symptoms unclear but didn't appear to be crohn's flare or bowel obstruction. Patient was discharged home 01/18/13 with an appointment to see Dr. BOlevia Perchesin the office. A work excuse letter was composed at patient's request.   2. B12 deficiency, continue replacement therapy  3. HTN, stable  4. Mild transaminitis, new. Workup if indicated as outpatient   Discharge Vitals:  BP 109/58  Pulse 67  Temp(Src) 98.2 F (36.8 C) (Oral)  Resp 16  Ht 5' (1.524 m)  Wt 118 lb 2.7 oz (53.6 kg)  BMI 23.08 kg/m2  SpO2  96%  Disposition and follow-up:   [name]    Follow-up Appointments: Discharge Orders   Future Appointments Provider Department Dept Phone   02/03/2013 2:00 PM [facility] MD [facility] [phone]  06/01/2013 7:30 AM [facility] [phone]  06/08/2013 8:30 AM [facility]ia Bush [phone]  Future Orders Complete By Expires     Increase activity slowly  As directed     Comments:      Walk with assistance use walker or cane as needed    Resume previous diet  As directed        Discharge Medications:   Medication List    TAKE these medications       amLODipine 5 MG tablet  Commonly known as:  NORVASC  Take 5 mg by mouth at bedtime.     cetirizine 10 MG tablet  Commonly known as:  ZYRTEC  Take 10 mg by mouth at bedtime.     citalopram 10 MG tablet  Commonly known as:  CELEXA  Take 10 mg by mouth every other day.     clobetasol cream 0.05 %  Commonly known as:  TEMOVATE  Apply 1 application topically 2 (two) times daily as needed (  for rash.  Apply to affected area.).     cyanocobalamin 1000 MCG/ML injection  Commonly known as:  (VITAMIN B-12)  Inject 1 mL (1,000 mcg total) into the muscle every 30 (thirty) days.     ferrous sulfate 325 (65 FE) MG tablet  Take 1 tablet (325 mg total) by mouth daily with breakfast.     fluticasone 50 MCG/ACT nasal spray  Commonly known as:  FLONASE  Place 2 sprays into the nose daily as needed for allergies.     hydrOXYzine 25 MG tablet  Commonly known as:  ATARAX/VISTARIL  Take 1 tablet (25 mg total) by mouth 3 (three) times daily as needed for itching.     mercaptopurine 50 MG tablet  Commonly known as:  PURINETHOL  Take 50 mg by mouth daily. Give on an empty stomach 1 hour before or 2 hours after meals. Caution: Chemotherapy.     olopatadine 0.1 %  ophthalmic solution  Commonly known as:  PATANOL  Place 1 drop into both eyes 2 (two) times daily as needed for allergies.     omeprazole 20 MG capsule  Commonly known as:  PRILOSEC  Take 1 capsule (20 mg total) by mouth daily.        Signed: Tye Savoy 01/18/2013, 1:58 PM

## 2013-01-18 NOTE — Care Management (Signed)
Cm spoke with patient concerning discharge planning. Pt states being ambulatory and will discharge home today if tolerates full liquid diet. Pt states being unmarried but has plenty of family support. Pt drove to emergency room, and may decide to drive self home upon discharge. Cm informed pt to clear ability to drive home with MD. If plan changes, other possible arrangements to be made. No HH services or DME needs stated. Patient uses pharmacy in [place].Primary Care Physician: [name], MD    Arlean Hopping  [phone]

## 2013-01-18 NOTE — Progress Notes (Signed)
[facility] Progress Note  SUBJECTIVE: She feels even better today than yesterday. Only two loose stools yesterday. Tolerating liquids.   OBJECTIVE:  Vital signs in last 24 hours: Temp:  [97.7 F (36.5 C)-98.2 F (36.8 C)] 98.2 F (36.8 C) (03/26 0505) Pulse Rate:  [66-70] 67 (03/26 0505) Resp:  [16-18] 16 (03/26 0505) BP: (109-159)/(58-78) 109/58 mmHg (03/26 0505) SpO2:  [95 %-96 %] 96 % (03/26 0505) Last BM Date: 01/17/13 General:    white female in NAD Heart:  Regular rate and rhythm Lungs: Respirations even and unlabored, lungs CTA bilaterally Abdomen:  Soft, nontender and nondistended. Normal bowel sounds. Extremities:  Without edema. Neurologic:  Alert and oriented,  grossly normal neurologically. Psych:  Cooperative. Normal mood and affect.   Lab Results:  Recent Labs  01/16/13 1635  WBC 6.3  HGB 13.3  HCT 39.9  PLT 271   BMET  Recent Labs  01/16/13 1635  NA 140  K 3.5  CL 102  CO2 28  GLUCOSE 86  BUN 8  CREATININE 0.71  CALCIUM 9.9   LFT  Recent Labs  01/16/13 1635  PROT 8.0  ALBUMIN 4.2  AST 38*  ALT 60*  ALKPHOS 77  BILITOT 0.7   PT/INR  Recent Labs  01/16/13 1635  LABPROT 11.9  INR 0.88    Studies/Results: Ct Abdomen Pelvis W Contrast  01/17/2013  *RADIOLOGY REPORT*  Clinical Data:  Abdominal pain, Crohn's disease, nausea  CT ABDOMEN AND PELVIS WITH CONTRAST  Technique:  Multidetector CT imaging of the abdomen and pelvis was performed following the standard protocol during bolus administration of intravenous contrast.  Contrast: 1100m OMNIPAQUE IOHEXOL 300 MG/ML  SOLN  Comparison: 11/10/2007.  Findings: Sagittal images of the spine are unremarkable.  Lung bases are unremarkable.  Liver shows no biliary ductal dilatation.  Mild distended gallbladder without evidence of calcified gallstones.  The pancreas, spleen and adrenal glands are unremarkable.  Kidneys are symmetrical in size and enhancement.  No hydronephrosis or  hydroureter.  Delayed renal images shows bilateral renal symmetrical excretion. Mild dilatation of the right renal pelvis without change from prior exam.  No small bowel obstruction.  No ascites or free air.  No adenopathy.  The abdominal aorta is unremarkable.  The patient is status post appendectomy.  No pericecal inflammation.  The patient is status post hysterectomy.  The urinary bladder is unremarkable. Nonspecific mild thickening of the terminal ileal wall without surrounding inflammatory changes.  IMPRESSION:  1.  No acute inflammatory process within abdomen or pelvis. 2.  Mild distended gallbladder without evidence of calcified gallstones. 3.  Status post appendectomy.  Status post hysterectomy. 4.  No small bowel obstruction.   Original Report Authenticated By: LLahoma Crocker M.D.    Acute Abdominal Series  01/16/2013  *RADIOLOGY REPORT*  Clinical Data: Crohn's disease.  Abdominal pain with nausea, vomiting, and diarrhea.  ACUTE ABDOMEN SERIES (ABDOMEN 2 VIEW & CHEST 1 VIEW)  Comparison: Radiograph dated 08/19/2010  Findings: The heart size and pulmonary vascularity are normal and the lungs are clear.  No free air in the abdomen.  There are no dilated loops of large or small bowel.  There is air throughout the nondistended colon. Small air-fluid levels in the ascending colon.  Thoracolumbar scoliosis, unchanged.  IMPRESSION: Benign-appearing abdomen.  No evidence of bowel obstruction.   Original Report Authenticated By: JLorriane Shire M.D.     ASSESSMENT / PLAN:  1. Longstanding Crohn's. Maintained on 63m I mentioned previously that she was on 10056m  of 26m at home, she is actually on 553mdaily at home. Patient started on 10031mpon admission 2 days ago. Will clarify intended dose with admitting MD. CTscan yesterday didn't show any obstructive process or active inflammation. C-Diff is negative. Patient looks even better today than yesterday. Will try her on full liquids and if tolerates then home  today.  2.  Post-prandial upper abdominal cramping / bloating. This has been ongoing for several months and unrelated to reason for admission. No weight loss. She may need EGD and / or gallbladder evaluation. Further workup as outpatient, I made her appt with Dr. BroOlevia Perches11/14 at 2pm.  3. HTN, stable.   Addendum: Clarified 6mp70mse. Patient should be on 6mp 46mg 65my at home. Will order at discharge.     LOS: 2 days   Paula Tye Savoy/2014, 9:17 AM   GI ATTENDING  As above. KathryTenyaing well. CT did not show active Crohn's. Workup otherwise negative. She may have had an acute viral gastroenteritis to explain symptoms, which have now resolved. Followup as outlined above.  John NDocia Chucky,[place]. [facility]

## 2013-01-18 NOTE — Progress Notes (Signed)
Pt D/C home,alert and oriented no new complains,discharge instructions and medication administration done, pt verbalizes understanding

## 2013-01-26 ENCOUNTER — Telehealth: Payer: Self-pay | Admitting: Internal Medicine

## 2013-01-26 DIAGNOSIS — E538 Deficiency of other specified B group vitamins: Secondary | ICD-10-CM

## 2013-01-26 NOTE — Telephone Encounter (Signed)
Patient requests B12 rx be sent to [place]. However, it appears that her last b12 level on 12/16/12 was normal at 451. Dr Olevia Perches, does patient need B12 or is her level normal enough for her to discontinue any B12 supplementation?

## 2013-01-26 NOTE — Telephone Encounter (Signed)
I have reviewed last 6 B12 value and repeat the level in 6 months.s since 2012 and they are all >380 ug. In view of the shortage of B12 injectable, I recommend to hold the B12 for 6 months

## 2013-01-27 NOTE — Telephone Encounter (Signed)
I have spoken to patient and have advised patient of Dr Nichola Sizer recommendation to d/c b12 x 6 months and recheck her level 6 months from now. She verbalizes understanding.

## 2013-02-03 ENCOUNTER — Ambulatory Visit: Payer: PRIVATE HEALTH INSURANCE | Admitting: Internal Medicine

## 2013-03-21 ENCOUNTER — Encounter: Payer: Self-pay | Admitting: Internal Medicine

## 2013-03-21 ENCOUNTER — Other Ambulatory Visit (INDEPENDENT_AMBULATORY_CARE_PROVIDER_SITE_OTHER): Payer: PRIVATE HEALTH INSURANCE

## 2013-03-21 ENCOUNTER — Ambulatory Visit (INDEPENDENT_AMBULATORY_CARE_PROVIDER_SITE_OTHER): Payer: PRIVATE HEALTH INSURANCE | Admitting: Internal Medicine

## 2013-03-21 VITALS — BP 110/64 | HR 76 | Ht 60.0 in | Wt 119.4 lb

## 2013-03-21 DIAGNOSIS — K56609 Unspecified intestinal obstruction, unspecified as to partial versus complete obstruction: Secondary | ICD-10-CM

## 2013-03-21 DIAGNOSIS — K5090 Crohn's disease, unspecified, without complications: Secondary | ICD-10-CM

## 2013-03-21 DIAGNOSIS — Z23 Encounter for immunization: Secondary | ICD-10-CM

## 2013-03-21 DIAGNOSIS — K5000 Crohn's disease of small intestine without complications: Secondary | ICD-10-CM

## 2013-03-21 LAB — CBC WITH DIFFERENTIAL/PLATELET
Eosinophils Absolute: 0.1 10*3/uL (ref 0.0–0.7)
Eosinophils Relative: 1.3 % (ref 0.0–5.0)
MCV: 96.9 fl (ref 78.0–100.0)
Monocytes Absolute: 0.6 10*3/uL (ref 0.1–1.0)
Neutrophils Relative %: 73.2 % (ref 43.0–77.0)
Platelets: 269 10*3/uL (ref 150.0–400.0)
WBC: 9.1 10*3/uL (ref 4.5–10.5)

## 2013-03-21 LAB — HEPATIC FUNCTION PANEL
ALT: 36 U/L — ABNORMAL HIGH (ref 0–35)
Total Bilirubin: 0.7 mg/dL (ref 0.3–1.2)
Total Protein: 7.3 g/dL (ref 6.0–8.3)

## 2013-03-21 LAB — HEPATITIS B SURFACE ANTIGEN: Hepatitis B Surface Ag: NEGATIVE

## 2013-03-21 LAB — IBC PANEL
Iron: 94 ug/dL (ref 42–145)
Transferrin: 241.6 mg/dL (ref 212.0–360.0)

## 2013-03-21 NOTE — Patient Instructions (Addendum)
You have been given your Pneumonia vaccine today. Your physician has requested that you go to the basement for lab work before leaving today. CC:  Michelle Bush MD

## 2013-03-21 NOTE — Progress Notes (Signed)
Michelle Vasquez December 31, 1950 MRN [id number]        History of Present Illness:  This is a 62 year old white female with the Crohn's disease hx of  terminal ileal resection in 1981 and small bowel obstruction in 2008. She had a recent hospitalization for severe diarrhea and abdominal pain attributed to viral gastroenteritis. Hospitalization walls from March 24 of 01/18/2013. CT scan of the abdomen showed thickened distal ileum. She has been on 6-MP 50 mg daily. Last colonoscopy in May 2011 showed 50% narrowing at the anastomosis, biopsies confirmed ileitis. Prior colonoscopy in 2006 also showed narrow anastomosis. She is doing well having  diarrheal stools several times a day. She takes Imodium once or twice a day. Upper endoscopy in 2006 for dysphagia showed  mild stricture which was dilated with 48 French Maloney dilator. She denies recurrent dysphagia   Past Medical History  Diagnosis Date  . HTN (hypertension)   . Crohn disease     s/p colectomy  . History of small bowel obstruction   . Osteomalacia, unspecified   . Unspecified deficiency anemia   . B12 deficiency   . Internal hemorrhoids without mention of complication   . Unspecified polyarthropathy or polyarthritis, multiple sites   . Central hearing loss   . Perennial allergic rhinitis with seasonal variation   . Fatty liver 12/15/11   Past Surgical History  Procedure Laterality Date  . Total abdominal hysterectomy  1975    ovaries out as well  . Appendectomy    . Other surgical history  1960s, 1978, 1980    ileal resection x 3 and subtotal colon resection  . Esophagogastroduodenoscopy  02/25/05    Dilated stricture  . Colonoscopy  02/25/05    Internal hemms; crohns; narrow anast  . Colonoscopy  02/26/10    Prior right hemicolectomy o/w benign (Dr. Olevia Perches)  . Hemicolectomy  1980    all small intestine, none large  . Dexa  07/06/2011    normal T score -1.0 femur, spine    reports that she quit smoking about 34 years ago.  She has never used smokeless tobacco. She reports that she does not drink alcohol or use illicit drugs. family history includes Alcohol abuse in her brother; Cirrhosis in her brother; Crohn's disease in her father, sister, and unspecified family members; Heart disease (age of onset: 69) in her mother; and Hypertension in her mother.  There is no history of Colon cancer, and Cancer, and Diabetes, . No Known Allergies      Review of Systems: Negative for dysphagia odynophagia  The remainder of the 10 point ROS is negative except as outlined in H&P   Physical Exam: General appearance  Well developed, in no distress. Eyes- non icteric. HEENT nontraumatic, normocephalic. Mouth no lesions, tongue papillated, no cheilosis. Neck supple without adenopathy, thyroid not enlarged, no carotid bruits, no JVD. Lungs Clear to auscultation bilaterally. Cor normal S1, normal S2, regular rhythm, no murmur,  quiet precordium. Abdomen: Tender left lower quadrant and right lower quadrant. No palpable mass Rectal: Heme positive stool, hypertensive rectal sphincter Extremities no pedal edema. Skin no lesions. Neurological alert and oriented x 3. Psychological normal mood and affect.  Assessment and Plan:  62 year old white female with the Crohn's disease of the terminal ileum and  recurrent mild stricture at the ileocolic anastomosis. She is on 50 mg 6-MP. And she has a stable diarrhea controlled with Imodium. I have suggested probiotics for possible bacterial overgrowth. She is up-to-date on her bone density. She  will receive Pneumovax  today and we will check her hepatitis B status. She has PPD yearly at work as well as flu vaccine. We will check her iron studies, B12, CBC, liver function tests today. I will see her in one year   03/21/2013 [name]

## 2013-03-22 ENCOUNTER — Other Ambulatory Visit: Payer: Self-pay | Admitting: *Deleted

## 2013-03-22 LAB — HEPATITIS B SURFACE ANTIBODY,QUALITATIVE: Hep B S Ab: NONREACTIVE

## 2013-03-22 MED ORDER — CYANOCOBALAMIN 500 MCG/0.1ML NA SOLN: NASAL | Status: DC

## 2013-03-23 ENCOUNTER — Other Ambulatory Visit: Payer: Self-pay | Admitting: *Deleted

## 2013-03-25 ENCOUNTER — Encounter: Payer: Self-pay | Admitting: Family Medicine

## 2013-03-30 ENCOUNTER — Other Ambulatory Visit: Payer: Self-pay

## 2013-03-30 ENCOUNTER — Ambulatory Visit: Payer: PRIVATE HEALTH INSURANCE | Admitting: Internal Medicine

## 2013-03-30 ENCOUNTER — Telehealth: Payer: Self-pay

## 2013-03-30 DIAGNOSIS — Z23 Encounter for immunization: Secondary | ICD-10-CM

## 2013-03-30 DIAGNOSIS — Z1231 Encounter for screening mammogram for malignant neoplasm of breast: Secondary | ICD-10-CM

## 2013-03-30 NOTE — Telephone Encounter (Signed)
Explained to pt I had scheduled her 2nd Hep B injection incorrectly.  Rescheduled for 05-01-13.  Patient understood

## 2013-04-05 ENCOUNTER — Ambulatory Visit: Payer: PRIVATE HEALTH INSURANCE

## 2013-04-07 ENCOUNTER — Other Ambulatory Visit: Payer: Self-pay | Admitting: Gastroenterology

## 2013-04-07 MED ORDER — FERROUS SULFATE 325 (65 FE) MG PO TABS: 325.0000 mg | ORAL_TABLET | Freq: Every day | ORAL | Status: DC

## 2013-05-01 ENCOUNTER — Other Ambulatory Visit: Payer: Self-pay

## 2013-05-01 ENCOUNTER — Ambulatory Visit (INDEPENDENT_AMBULATORY_CARE_PROVIDER_SITE_OTHER): Payer: PRIVATE HEALTH INSURANCE | Admitting: Internal Medicine

## 2013-05-01 DIAGNOSIS — Z23 Encounter for immunization: Secondary | ICD-10-CM

## 2013-05-10 ENCOUNTER — Other Ambulatory Visit: Payer: Self-pay | Admitting: Family Medicine

## 2013-05-23 ENCOUNTER — Telehealth: Payer: Self-pay | Admitting: *Deleted

## 2013-05-23 NOTE — Telephone Encounter (Signed)
Patient is coming in for labs next week for upcoming CPE. She wants to be sure a B12 level is drawn.

## 2013-05-24 NOTE — Telephone Encounter (Signed)
Noted  

## 2013-05-28 ENCOUNTER — Other Ambulatory Visit: Payer: Self-pay | Admitting: Family Medicine

## 2013-05-28 DIAGNOSIS — I10 Essential (primary) hypertension: Secondary | ICD-10-CM

## 2013-05-28 DIAGNOSIS — E538 Deficiency of other specified B group vitamins: Secondary | ICD-10-CM

## 2013-05-28 DIAGNOSIS — D509 Iron deficiency anemia, unspecified: Secondary | ICD-10-CM

## 2013-05-28 DIAGNOSIS — K5000 Crohn's disease of small intestine without complications: Secondary | ICD-10-CM

## 2013-05-31 ENCOUNTER — Ambulatory Visit
Admission: RE | Admit: 2013-05-31 | Discharge: 2013-05-31 | Disposition: A | Discharge status: Home / Self Care | Payer: PRIVATE HEALTH INSURANCE | Source: Ambulatory Visit

## 2013-05-31 DIAGNOSIS — Z1231 Encounter for screening mammogram for malignant neoplasm of breast: Secondary | ICD-10-CM

## 2013-06-01 ENCOUNTER — Encounter: Payer: Self-pay | Admitting: *Deleted

## 2013-06-01 ENCOUNTER — Other Ambulatory Visit (INDEPENDENT_AMBULATORY_CARE_PROVIDER_SITE_OTHER): Payer: PRIVATE HEALTH INSURANCE

## 2013-06-01 DIAGNOSIS — K5000 Crohn's disease of small intestine without complications: Secondary | ICD-10-CM

## 2013-06-01 DIAGNOSIS — I10 Essential (primary) hypertension: Secondary | ICD-10-CM

## 2013-06-01 DIAGNOSIS — D509 Iron deficiency anemia, unspecified: Secondary | ICD-10-CM

## 2013-06-01 DIAGNOSIS — E538 Deficiency of other specified B group vitamins: Secondary | ICD-10-CM

## 2013-06-01 LAB — COMPREHENSIVE METABOLIC PANEL
Alkaline Phosphatase: 69 U/L (ref 39–117)
BUN: 10 mg/dL (ref 6–23)
CO2: 30 mEq/L (ref 19–32)
Creatinine, Ser: 0.9 mg/dL (ref 0.4–1.2)
GFR: 70.88 mL/min (ref >60.00)
Glucose, Bld: 78 mg/dL (ref 70–99)
Sodium: 141 mEq/L (ref 135–145)
Total Bilirubin: 0.7 mg/dL (ref 0.3–1.2)

## 2013-06-01 LAB — LIPID PANEL
HDL: 71.8 mg/dL (ref >39.00)
Triglycerides: 79 mg/dL (ref 0.0–149.0)
VLDL: 15.8 mg/dL (ref 0.0–40.0)

## 2013-06-01 LAB — FERRITIN: Ferritin: 363.1 ng/mL — ABNORMAL HIGH (ref 10.0–291.0)

## 2013-06-01 LAB — VITAMIN B12: Vitamin B-12: 342 pg/mL (ref 211–911)

## 2013-06-08 ENCOUNTER — Ambulatory Visit (INDEPENDENT_AMBULATORY_CARE_PROVIDER_SITE_OTHER): Payer: PRIVATE HEALTH INSURANCE | Admitting: Family Medicine

## 2013-06-08 ENCOUNTER — Encounter: Payer: Self-pay | Admitting: Family Medicine

## 2013-06-08 VITALS — BP 132/80 | HR 84 | Temp 97.9°F | Ht 60.0 in | Wt 115.8 lb

## 2013-06-08 DIAGNOSIS — R238 Other skin changes: Secondary | ICD-10-CM

## 2013-06-08 DIAGNOSIS — F329 Major depressive disorder, single episode, unspecified: Secondary | ICD-10-CM

## 2013-06-08 DIAGNOSIS — E538 Deficiency of other specified B group vitamins: Secondary | ICD-10-CM

## 2013-06-08 DIAGNOSIS — F341 Dysthymic disorder: Secondary | ICD-10-CM

## 2013-06-08 DIAGNOSIS — Z0000 Encounter for general adult medical examination without abnormal findings: Secondary | ICD-10-CM

## 2013-06-08 DIAGNOSIS — R0789 Other chest pain: Secondary | ICD-10-CM

## 2013-06-08 DIAGNOSIS — L989 Disorder of the skin and subcutaneous tissue, unspecified: Secondary | ICD-10-CM

## 2013-06-08 DIAGNOSIS — R399 Unspecified symptoms and signs involving the genitourinary system: Secondary | ICD-10-CM | POA: Insufficient documentation

## 2013-06-08 DIAGNOSIS — R3989 Other symptoms and signs involving the genitourinary system: Secondary | ICD-10-CM

## 2013-06-08 DIAGNOSIS — I10 Essential (primary) hypertension: Secondary | ICD-10-CM

## 2013-06-08 DIAGNOSIS — K5000 Crohn's disease of small intestine without complications: Secondary | ICD-10-CM

## 2013-06-08 DIAGNOSIS — F32A Depression, unspecified: Secondary | ICD-10-CM

## 2013-06-08 LAB — POCT URINALYSIS DIPSTICK
Glucose, UA: NEGATIVE
Nitrite, UA: NEGATIVE
Protein, UA: NEGATIVE
Spec Grav, UA: 1.015
Urobilinogen, UA: 0.2

## 2013-06-08 NOTE — Assessment & Plan Note (Signed)
Chronic, stable. continue med

## 2013-06-08 NOTE — Assessment & Plan Note (Signed)
Longstanding.  ?low grade seborrheic dermatitis vs other.  Recommended change shampoo to johnson baby shampoo, update me if not improved, consider steroid shampoo.

## 2013-06-08 NOTE — Assessment & Plan Note (Signed)
Preventative protocols reviewed and updated unless pt declined. Discussed healthy diet and lifestyle.  

## 2013-06-08 NOTE — Progress Notes (Signed)
Subjective:    Patient ID: Michelle Vasquez, female    DOB: August 12, 1951, 62 y.o.   MRN: [id number]  HPI CC: CPE  Some urinary symptoms intermittently for last 3-4 weeks.  Drinking water/cranberry juice helps this.  Some stinging with voiding, some urgency, some LLQ pain.  Would like urine checked today. Persistent sinus congestion and sinus headaches. Self stopped celexa.  Feels mood stable.  Chest pain: endorses pulling sensation/pressure left upper abdomen and left chest, that radiates to L arm and up neck.  Recently becoming more frequent.  Can last minutes or entire day.  Associated with loud sounds/ringing in left ear.  Some worsening of discomfort with walking that makes her have to sit down and rest (improves pain).  Not reproducible with chest pressure.  Not food related.  Not associated with GERDsxs.  Denies associated nausea/dyspnea but does endorse diaphoresis.  +fmhx CAD - mother with CAD starting at age 35 "angina".  Ex smoker.  Takes omeprazole 70m daily as well as zantac prn.  Omeprazole seems to help with this.  Preventative:  Pap - s/p total hysterectomy, ovaries removed at age 62yo for possible fibroids. Took hormone replacement for a few months, not after that.  Colonoscopy last 2011, rpt in 10 yrs. Sees Dr. BOlevia Perchesevery 6 mo for blood work due to MP.  Breast exam - Last mammo 05/2013, Birads 1. Rpt due 1 year.  Td 2005.  Flu shot yearly at work.  Currently receiving Hep B series. Pneumovax 2014. zostavax - discussed, pt will check with insurance.   PPD at work yearly.   Seat belt use discussed. Sunscreen use discussed.  No suspicious skin lesions.  Did have sunburn on posterior scalp 1.5 yrs ago - remaining red and irritated.  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  Past histories reviewed and updated if relevant as below. Patient Active Problem List   Diagnosis Date Noted  . Acute diarrhea 01/16/2013  . Nausea and vomiting 01/16/2013  . Anxiety and depression 07/03/2011  . Healthcare maintenance 06/05/2011  . Perennial allergic rhinitis with seasonal variation   . Unspecified family circumstance   . MICROSCOPIC HEMATURIA 09/05/2010  . POSTNASAL DRIP SYNDROME 09/05/2010  . ANEMIA, IRON DEFICIENCY 04/15/2010  . VITAMIN B12 DEFICIENCY 04/10/2009  . POLYARTHRITIS NOS, MULTIPLE SITES 04/10/2009  . DIARRHEA, CHRONIC 04/10/2009  . OSTEOMALACIA, UNSPECIFIED 10/04/2008  . ESSENTIAL HYPERTENSION 08/27/2008  . LOSS, CENTRAL HEARING 01/19/2007  . Crohn's disease of small intestine 01/19/2007  . MENOPAUSE, SURGICAL 01/19/2007  . COLECTOMY, PARTIAL, WITH ANASTOMOSIS, HX OF 01/19/2007  . HEMORRHOIDS, INTERNAL 03/24/2005   Past Medical History  Diagnosis Date  . HTN (hypertension)   . Crohn disease     s/p colectomy on 6MP, ?bacterial overgrowth  . History of small bowel obstruction   . Osteomalacia, unspecified   . Unspecified deficiency anemia   . B12 deficiency   . Internal hemorrhoids without mention of complication   . Unspecified polyarthropathy or polyarthritis, multiple sites   . Central hearing loss   . Perennial allergic rhinitis with seasonal variation   . Fatty liver 12/15/11   Past Surgical History  Procedure Laterality Date  . Total abdominal hysterectomy  1975    ovaries out as well  . Appendectomy    .  Other surgical history  1960s, 1978, 1980    ileal resection x 3 and subtotal colon resection  . Esophagogastroduodenoscopy  02/25/05    Dilated stricture  . Colonoscopy  02/25/05    Internal hemms; crohns; narrow anast  . Colonoscopy  02/26/10    Prior right hemicolectomy o/w benign (Dr. Olevia Perches)  . Hemicolectomy  1980    all small intestine, none large  .  Dexa  07/06/2011    normal T score -1.0 femur, spine   History  Substance Use Topics  . Smoking status: Former Smoker    Quit date: 10/26/1978  . Smokeless tobacco: Never Used  . Alcohol Use: No   Family History  Problem Relation Age of Onset  . Crohn's disease Father     colostomy  . Hypertension Mother   . Heart disease Mother 98  . Alcohol abuse Brother   . Cirrhosis Brother   . Crohn's disease Sister   . Crohn's disease      Aunts x 3  . Crohn's disease      neice x 2  . Colon cancer Neg Hx   . Cancer Neg Hx   . Diabetes Neg Hx    No Known Allergies Current Outpatient Prescriptions on File Prior to Visit  Medication Sig Dispense Refill  . amLODipine (NORVASC) 5 MG tablet TAKE ONE (1) TABLET BY MOUTH EVERY DAY  30 tablet  11  . cetirizine (ZYRTEC) 10 MG tablet Take 10 mg by mouth at bedtime.      . citalopram (CELEXA) 10 MG tablet Take 10 mg by mouth every other day.      . Cyanocobalamin (NASCOBAL) 500 MCG/0.1ML SOLN One spray in one nostril, once a week. Alternate nostrils.  1.3 mL  3  . ferrous sulfate 325 (65 FE) MG tablet Take 1 tablet (325 mg total) by mouth daily with breakfast.  30 tablet  3  . fluticasone (FLONASE) 50 MCG/ACT nasal spray Place 2 sprays into the nose daily as needed for allergies.      . hydrOXYzine (ATARAX/VISTARIL) 25 MG tablet Take 1 tablet (25 mg total) by mouth 3 (three) times daily as needed for itching.  30 tablet  0  . mercaptopurine (PURINETHOL) 50 MG tablet Take 50 mg by mouth daily. Give on an empty stomach 1 hour before or 2 hours after meals. Caution: Chemotherapy.      Marland Kitchen olopatadine (PATANOL) 0.1 % ophthalmic solution Place 1 drop into both eyes 2 (two) times daily as needed for allergies.      Marland Kitchen omeprazole (PRILOSEC) 20 MG capsule Take 1 capsule (20 mg total) by mouth daily.  30 capsule  4  . clobetasol cream (TEMOVATE) 0.08 % Apply 1 application topically 2 (two) times daily as needed (for rash.  Apply to affected area.).       No  current facility-administered medications on file prior to visit.     Review of Systems  Constitutional: Negative for fever, chills, activity change, appetite change, fatigue and unexpected weight change.  HENT: Positive for congestion. Negative for hearing loss and neck pain.   Eyes: Negative for visual disturbance.  Respiratory: Negative for cough, chest tightness, shortness of breath and wheezing.   Cardiovascular: Negative for chest pain, palpitations and leg swelling.  Gastrointestinal: Positive for abdominal pain, diarrhea and blood in stool (h/o internal hemorrhoids). Negative for nausea, vomiting, constipation and abdominal distention.  Genitourinary: Negative for hematuria and difficulty urinating.  Musculoskeletal: Negative for myalgias and arthralgias.  Skin:  Negative for rash.  Neurological: Positive for headaches (sinus). Negative for dizziness, seizures and syncope.  Hematological: Negative for adenopathy. Does not bruise/bleed easily.  Psychiatric/Behavioral: Negative for dysphoric mood. The patient is not nervous/anxious.        Objective:   Physical Exam  Nursing note and vitals reviewed. Constitutional: She is oriented to person, place, and time. She appears well-developed and well-nourished. No distress.  HENT:  Head: Normocephalic and atraumatic.  Right Ear: External ear normal.  Left Ear: External ear normal.  Nose: Nose normal.  Mouth/Throat: Oropharynx is clear and moist. No oropharyngeal exudate.  Superior scalp erythematous and some scaling  Eyes: Conjunctivae and EOM are normal. Pupils are equal, round, and reactive to light. No scleral icterus.  Neck: Normal range of motion. Neck supple. No thyromegaly present.  Cardiovascular: Normal rate, regular rhythm, normal heart sounds and intact distal pulses.   No murmur heard. Pulses:      Radial pulses are 2+ on the right side, and 2+ on the left side.  Pulmonary/Chest: Effort normal and breath sounds normal.  No respiratory distress. She has no wheezes. She has no rales. She exhibits no tenderness. Right breast exhibits no inverted nipple, no mass, no nipple discharge, no skin change and no tenderness. Left breast exhibits no inverted nipple, no mass, no nipple discharge, no skin change and no tenderness.  Abdominal: Soft. Bowel sounds are normal. She exhibits no distension and no mass. There is no tenderness. There is no rebound and no guarding.  Genitourinary:  deferred  Musculoskeletal: Normal range of motion. She exhibits no edema.  Lymphadenopathy:    She has no cervical adenopathy.    She has no axillary adenopathy.       Right axillary: No lateral adenopathy present.       Left axillary: No lateral adenopathy present.      Right: No supraclavicular adenopathy present.       Left: No supraclavicular adenopathy present.  Neurological: She is alert and oriented to person, place, and time.  CN grossly intact, station and gait intact  Skin: Skin is warm and dry. No rash noted.  Psychiatric: She has a normal mood and affect. Her behavior is normal. Judgment and thought content normal.       Assessment & Plan:

## 2013-06-08 NOTE — Assessment & Plan Note (Signed)
Somewhat atypical chest pain, but given fmhx I did recommend referral to cards for discussion on merits of exercise treadmill. EKG today - NSR rate 75, normal axis, intervals, no acute ST/T changes.

## 2013-06-08 NOTE — Assessment & Plan Note (Signed)
Improved off celexa.  Continue to monitor.

## 2013-06-08 NOTE — Assessment & Plan Note (Signed)
Seems stable on nasal B12 weekly.

## 2013-06-08 NOTE — Assessment & Plan Note (Signed)
Regularly sees Dr. Olevia Perches. On nasal B12 - seems to be maintaining B12 level.

## 2013-06-08 NOTE — Assessment & Plan Note (Signed)
UA today with small blood, but normal on microscopy.  Continue to monitor, continue to encourage water intake.

## 2013-06-08 NOTE — Patient Instructions (Addendum)
Call your insurance about the shingles shot to see if it is covered or how much it would cost and where is cheaper (here or pharmacy).  If you want to receive here, call for nurse visit.  Urine checked today. EKG today - pass by Marion's office for referral to heart doctor to discuss stress test. Try johnson and johnson baby shampoo for scalp, let me know if not helping

## 2013-06-09 ENCOUNTER — Encounter: Payer: Self-pay | Admitting: *Deleted

## 2013-06-12 ENCOUNTER — Ambulatory Visit (INDEPENDENT_AMBULATORY_CARE_PROVIDER_SITE_OTHER): Payer: PRIVATE HEALTH INSURANCE | Admitting: Cardiovascular Disease

## 2013-06-12 ENCOUNTER — Encounter: Payer: Self-pay | Admitting: Cardiovascular Disease

## 2013-06-12 ENCOUNTER — Other Ambulatory Visit: Payer: Self-pay | Admitting: *Deleted

## 2013-06-12 VITALS — BP 132/78 | HR 77 | Ht 60.0 in | Wt 116.0 lb

## 2013-06-12 VITALS — BP 132/78 | HR 77 | Ht 60.0 in | Wt 116.5 lb

## 2013-06-12 DIAGNOSIS — R0609 Other forms of dyspnea: Secondary | ICD-10-CM

## 2013-06-12 DIAGNOSIS — R000 Tachycardia, unspecified: Secondary | ICD-10-CM

## 2013-06-12 DIAGNOSIS — R002 Palpitations: Secondary | ICD-10-CM

## 2013-06-12 DIAGNOSIS — R079 Chest pain, unspecified: Secondary | ICD-10-CM

## 2013-06-12 DIAGNOSIS — R0600 Dyspnea, unspecified: Secondary | ICD-10-CM

## 2013-06-12 DIAGNOSIS — R0789 Other chest pain: Secondary | ICD-10-CM

## 2013-06-12 MED ORDER — OMEPRAZOLE 20 MG PO CPDR: 20.0000 mg | DELAYED_RELEASE_CAPSULE | Freq: Every day | ORAL | Status: DC

## 2013-06-12 MED ORDER — MERCAPTOPURINE 50 MG PO TABS: 50.0000 mg | ORAL_TABLET | Freq: Every day | ORAL | Status: DC

## 2013-06-12 NOTE — Patient Instructions (Addendum)
Treadmill looks good.

## 2013-06-12 NOTE — Assessment & Plan Note (Signed)
Chest pain is overall atypical. However, she has risk factors for coronary artery disease. Baseline ECG is within normal limits. I proceeded with a treadmill stress test today which showed no evidence of ischemia. Exercise capacity was below average. She did have mild nonlimiting chest discomfort with exercise. However, this was not associated with any significant ST changes. There was an element of physical deconditioning. Based on her atypical symptoms and negative treadmill stress test, no need for further ischemic testing at this point. If symptoms worsen in the future, stress testing with imaging can be considered.

## 2013-06-12 NOTE — Assessment & Plan Note (Signed)
She is complaining of almost daily palpitations mostly at night. I will obtain a 48-hour Holter monitor for evaluation.

## 2013-06-12 NOTE — Patient Instructions (Addendum)
Your physician has requested that you have an exercise tolerance test. For further information please visit [email or web address]. Please also follow instruction sheet, as given.  Your physician has recommended that you wear a holter monitor. Holter monitors are medical devices that record the heart's electrical activity. Doctors most often use these monitors to diagnose arrhythmias. Arrhythmias are problems with the speed or rhythm of the heartbeat. The monitor is a small, portable device. You can wear one while you do your normal daily activities. This is usually used to diagnose what is causing palpitations/syncope (passing out).  Follow up as needed.

## 2013-06-12 NOTE — Procedures (Signed)
    Treadmill Stress test  Indication: Chest pain  Baseline Data:  Resting EKG shows NSR with rate of 86 bpm, no significant ST or T wave changes. Resting blood pressure of 140/82 mm Hg Stand bruce protocal was used.  Exercise Data:  Patient exercised for 4 min 31 sec,  Peak heart rate of 141 bpm.  This was 89 % of the maximum predicted heart rate. She had mild nonlimiting chest discomfort with exercise which resolved with rest.  Peak Blood pressure recorded was 160/60 Maximal work level: 7 METs.  Heart rate at 3 minutes in recovery was 80 bpm. BP response: Normal HR response: Normal  EKG with Exercise: Sinus tachycardia with no significant ST changes. No arrhythmia  FINAL IMPRESSION: Normal exercise stress test. No significant EKG changes concerning for ischemia. Below average exercise tolerance.  Recommendation: The stress test showed no evidence of ischemia but with overall moderate risk Duke treadmill score due to short exercise time a nonlimiting chest pain. There is also evidence of deconditioning. Recommend continuing treatment of risk factors and increasing physical activities.

## 2013-06-12 NOTE — Progress Notes (Signed)
HPI  This is a 62 year old female who was referred by Dr. Danise Mina for evaluation of chest pain. She has no previous cardiac history. Risk factors include hypertension. Negative history for diabetes and smoking. There is family history of coronary artery disease but not prematurely. She has been having intermittent episodes of chest discomfort described as an aching sensation in the upper chest area associated with palpitations mostly at night. Different from her symptoms of GERD. The symptoms can happen at rest or with physical activities but do not have a consistent pattern. She complains mostly of palpitations at night when she is trying to sleep. There has been slight worsening of exertional dyspnea. She does not exercise on a regular basis. She works as a Surveyor, mining. She complains of being under stress.   Not on File   Current Outpatient Prescriptions on File Prior to Visit  Medication Sig Dispense Refill  . amLODipine (NORVASC) 5 MG tablet TAKE ONE (1) TABLET BY MOUTH EVERY DAY  30 tablet  11  . cetirizine (ZYRTEC) 10 MG tablet Take 10 mg by mouth at bedtime.      . clobetasol cream (TEMOVATE) 2.68 % Apply 1 application topically 2 (two) times daily as needed (for rash.  Apply to affected area.).      Marland Kitchen Cyanocobalamin (NASCOBAL) 500 MCG/0.1ML SOLN One spray in one nostril, once a week. Alternate nostrils.  1.3 mL  3  . ferrous sulfate 325 (65 FE) MG tablet Take 1 tablet (325 mg total) by mouth daily with breakfast.  30 tablet  3  . fluticasone (FLONASE) 50 MCG/ACT nasal spray Place 2 sprays into the nose daily as needed for allergies.      Marland Kitchen olopatadine (PATANOL) 0.1 % ophthalmic solution Place 1 drop into both eyes 2 (two) times daily as needed for allergies.       No current facility-administered medications on file prior to visit.     Past Medical History  Diagnosis Date  . HTN (hypertension)   . Crohn disease     s/p colectomy on 6MP, ?bacterial overgrowth  .  History of small bowel obstruction   . Osteomalacia, unspecified   . Unspecified deficiency anemia   . B12 deficiency   . Internal hemorrhoids without mention of complication   . Unspecified polyarthropathy or polyarthritis, multiple sites   . Central hearing loss   . Perennial allergic rhinitis with seasonal variation   . Fatty liver 12/15/11     Past Surgical History  Procedure Laterality Date  . Total abdominal hysterectomy  1975    ovaries out as well  . Appendectomy    . Other surgical history  1960s, 1978, 1980    ileal resection x 3 and subtotal colon resection  . Esophagogastroduodenoscopy  02/25/05    Dilated stricture  . Colonoscopy  02/25/05    Internal hemms; crohns; narrow anast  . Colonoscopy  02/26/10    Prior right hemicolectomy o/w benign (Dr. Olevia Perches)  . Hemicolectomy  1980    all small intestine, none large  . Dexa  07/06/2011    normal T score -1.0 femur, spine     Family History  Problem Relation Age of Onset  . Crohn's disease Father     colostomy  . Hypertension Mother   . Heart disease Mother 38  . Alcohol abuse Brother   . Cirrhosis Brother   . Crohn's disease Sister   . Crohn's disease      Aunts x 3  .  Crohn's disease      neice x 2  . Colon cancer Neg Hx   . Cancer Neg Hx   . Diabetes Neg Hx      History   Social History  . Marital Status: Divorced    Spouse Name: N/A    Number of Children: 3  . Years of Education: N/A   Occupational History  . Medical Transcription Other   Social History Main Topics  . Smoking status: Former Smoker -- 0.25 packs/day for 15 years    Types: Cigarettes    Quit date: 10/26/1978  . Smokeless tobacco: Never Used  . Alcohol Use: No  . Drug Use: No  . Sexual Activity: Not on file   Other Topics Concern  . Not on file   Social History Narrative   Caffeine: 2 cups coffee/day   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, no regular activity   Diet: good  water, fruits/vegetables occasionally     ROS A 10 point review of system was performed. It's negative other than mentioned in history of present illness.  PHYSICAL EXAM   BP 132/78  Pulse 77  Ht 5' (1.524 m)  Wt 116 lb 8 oz (52.844 kg)  BMI 22.75 kg/m2 Constitutional: She is oriented to person, place, and time. She appears well-developed and well-nourished. No distress.  HENT: No nasal discharge.  Head: Normocephalic and atraumatic.  Eyes: Pupils are equal and round. Right eye exhibits no discharge. Left eye exhibits no discharge.  Neck: Normal range of motion. Neck supple. No JVD present. No thyromegaly present.  Cardiovascular: Normal rate, regular rhythm, normal heart sounds. Exam reveals no gallop and no friction rub. No murmur heard.  Pulmonary/Chest: Effort normal and breath sounds normal. No stridor. No respiratory distress. She has no wheezes. She has no rales. She exhibits no tenderness.  Abdominal: Soft. Bowel sounds are normal. She exhibits no distension. There is no tenderness. There is no rebound and no guarding.  Musculoskeletal: Normal range of motion. She exhibits no edema and no tenderness.  Neurological: She is alert and oriented to person, place, and time. Coordination normal.  Skin: Skin is warm and dry. No rash noted. She is not diaphoretic. No erythema. No pallor.  Psychiatric: She has a normal mood and affect. Her behavior is normal. Judgment and thought content normal.     TFT:DDUKG  Rhythm  WITHIN NORMAL LIMITS   ASSESSMENT AND PLAN

## 2013-06-15 ENCOUNTER — Telehealth: Payer: Self-pay

## 2013-06-15 MED ORDER — OMEPRAZOLE 40 MG PO CPDR: 40.0000 mg | DELAYED_RELEASE_CAPSULE | Freq: Every day | ORAL | Status: DC

## 2013-06-15 NOTE — Telephone Encounter (Signed)
pzl notify I've sent in trial of 13m omeprazole.  May double up on current dose until runs out.

## 2013-06-15 NOTE — Telephone Encounter (Signed)
Pt said discussed with Dr [name] at physical appt that if indigestion does not get better will try stronger med; pt said indigestion bothering her with whatever she eats or drinks. Pt said if drinks water will burp and have indigestion. Pt request stronger med to [facility].Please advise.

## 2013-06-16 NOTE — Telephone Encounter (Signed)
Pt.notified

## 2013-07-04 ENCOUNTER — Telehealth: Payer: Self-pay

## 2013-07-05 ENCOUNTER — Encounter (INDEPENDENT_AMBULATORY_CARE_PROVIDER_SITE_OTHER): Payer: PRIVATE HEALTH INSURANCE

## 2013-07-05 DIAGNOSIS — R079 Chest pain, unspecified: Secondary | ICD-10-CM

## 2013-07-05 DIAGNOSIS — R002 Palpitations: Secondary | ICD-10-CM

## 2013-09-19 ENCOUNTER — Telehealth: Payer: Self-pay | Admitting: *Deleted

## 2013-09-19 DIAGNOSIS — D649 Anemia, unspecified: Secondary | ICD-10-CM

## 2013-09-19 NOTE — Telephone Encounter (Signed)
Message copied by Larina Bras on Tue Sep 19, 2013  8:17 AM ------      Message from: Larina Bras      Created: Mon Sep 18, 2013  1:38 PM       Left message for patient to call back.       ----- Message -----         From: Lafayette Dragon, MD         Sent: 09/18/2013  12:27 PM           To: Larina Bras, CMA            I agree with rechecking her CBC and Iron studies, which were normal in 02/2013 so she may not need the Iron.      ----- Message -----         From: Larina Bras, CMA         Sent: 09/18/2013   9:47 AM           To: Lafayette Dragon, MD            Patient requests refills on ferrous sulfate. It does not appear that she has had a CBC since 03/21/13. Do you want her to have a repeat blood count before refilling her iron?             ------

## 2013-09-19 NOTE — Telephone Encounter (Signed)
I have spoken to patient and advised of Dr Nichola Sizer recommendations. She verbalizes understanding and states she will come for labs next week to see if she still needs to remain on iron.

## 2013-09-25 ENCOUNTER — Other Ambulatory Visit (INDEPENDENT_AMBULATORY_CARE_PROVIDER_SITE_OTHER): Payer: PRIVATE HEALTH INSURANCE

## 2013-09-25 ENCOUNTER — Ambulatory Visit (INDEPENDENT_AMBULATORY_CARE_PROVIDER_SITE_OTHER): Payer: PRIVATE HEALTH INSURANCE | Admitting: Internal Medicine

## 2013-09-25 DIAGNOSIS — D649 Anemia, unspecified: Secondary | ICD-10-CM

## 2013-09-25 DIAGNOSIS — E538 Deficiency of other specified B group vitamins: Secondary | ICD-10-CM

## 2013-09-25 DIAGNOSIS — Z23 Encounter for immunization: Secondary | ICD-10-CM

## 2013-09-25 LAB — CBC WITH DIFFERENTIAL/PLATELET
Basophils Absolute: 0 10*3/uL (ref 0.0–0.1)
Eosinophils Absolute: 0.1 10*3/uL (ref 0.0–0.7)
Lymphs Abs: 2.2 10*3/uL (ref 0.7–4.0)
MCHC: 33.3 g/dL (ref 30.0–36.0)
MCV: 97.9 fl (ref 78.0–100.0)
Monocytes Absolute: 0.4 10*3/uL (ref 0.1–1.0)
Neutrophils Relative %: 53 % (ref 43.0–77.0)
Platelets: 276 10*3/uL (ref 150.0–400.0)
RDW: 15.2 % — ABNORMAL HIGH (ref 11.5–14.6)
WBC: 5.8 10*3/uL (ref 4.5–10.5)

## 2013-09-25 LAB — IBC PANEL
Iron: 88 ug/dL (ref 42–145)
Transferrin: 268.2 mg/dL (ref 212.0–360.0)

## 2013-09-27 ENCOUNTER — Telehealth: Payer: Self-pay | Admitting: *Deleted

## 2013-09-27 DIAGNOSIS — D509 Iron deficiency anemia, unspecified: Secondary | ICD-10-CM

## 2013-09-27 MED ORDER — FERROUS SULFATE 325 (65 FE) MG PO TABS: 325.0000 mg | ORAL_TABLET | ORAL | Status: DC

## 2013-09-27 NOTE — Telephone Encounter (Signed)
Patient called back. I advised that Dr Olevia Perches would like her to reduce her oral iron to 3 times weekly. We will recheck her CBC and IBC panel in 3 months. Orders have been placed in the computer and I will call to remind her the week before she is due for repeat labs. Patient verbalizes understanding.

## 2013-09-27 NOTE — Telephone Encounter (Signed)
Message copied by Larina Bras on Wed Sep 27, 2013 11:14 AM ------      Message from: Larina Bras      Created: Wed Sep 27, 2013 11:14 AM                   ----- Message -----         From: Larina Bras, CMA         Sent: 09/28/2013           To: Larina Bras, CMA            Left message for patient to call back.      ----- Message -----         From: Lafayette Dragon, MD         Sent: 09/27/2013  10:01 AM           To: Larina Bras, CMA            Reduce oral ironto 3x/week. Recheck CBC, Iron stusies in 3 months      ----- Message -----         From: Larina Bras, CMA         Sent: 09/27/2013   8:22 AM           To: Lafayette Dragon, MD            Dr Olevia Perches-      Pt requested refills on ferrous sulfate. She had labs 09-25-13. You noted that she did not need iron infusion. Does she still need oral iron?             ------

## 2013-10-24 ENCOUNTER — Other Ambulatory Visit: Payer: Self-pay | Admitting: *Deleted

## 2013-10-24 MED ORDER — MERCAPTOPURINE 50 MG PO TABS: 50.0000 mg | ORAL_TABLET | Freq: Every day | ORAL | Status: DC

## 2013-12-05 ENCOUNTER — Ambulatory Visit (INDEPENDENT_AMBULATORY_CARE_PROVIDER_SITE_OTHER): Payer: PRIVATE HEALTH INSURANCE | Admitting: Family Medicine

## 2013-12-05 ENCOUNTER — Encounter: Payer: Self-pay | Admitting: Family Medicine

## 2013-12-05 VITALS — BP 142/80 | HR 70 | Temp 97.8°F | Wt 110.5 lb

## 2013-12-05 DIAGNOSIS — K5000 Crohn's disease of small intestine without complications: Secondary | ICD-10-CM

## 2013-12-05 DIAGNOSIS — F329 Major depressive disorder, single episode, unspecified: Secondary | ICD-10-CM

## 2013-12-05 DIAGNOSIS — F419 Anxiety disorder, unspecified: Principal | ICD-10-CM

## 2013-12-05 DIAGNOSIS — F341 Dysthymic disorder: Secondary | ICD-10-CM

## 2013-12-05 MED ORDER — CITALOPRAM HYDROBROMIDE 10 MG PO TABS: 10.0000 mg | ORAL_TABLET | Freq: Every day | ORAL | Status: DC

## 2013-12-05 MED ORDER — PANTOPRAZOLE SODIUM 40 MG PO TBEC: 40.0000 mg | DELAYED_RELEASE_TABLET | Freq: Every day | ORAL | Status: DC

## 2013-12-05 NOTE — Progress Notes (Signed)
BP 142/80  Pulse 70  Temp(Src) 97.8 F (36.6 C) (Oral)  Wt 110 lb 8 oz (50.122 kg)   CC: discuss med  Subjective:    Patient ID: Michelle Vasquez, female    DOB: 1951/06/28, 63 y.o.   MRN: [id number]  HPI: Michelle Vasquez is a 63 y.o. female presenting on 12/05/2013 with Follow-up  H/o anxiety/depression prior on celexa 3m daily (off this med for 1.5 years).  Since christmas, started noticing increasing anxiety - feeling shaky feeling inside "in pit of stomach".  Trouble sleeping, with fearful feeling.  Very easily overwhelmed.  Never with panic attack.  Increased stress at home- daughter recently moved in (she has some drug issues), also more busy at work, holiday season. No stress relieving strategies.  She self started celexa 138mleft over pills she had.  Not needing anything else right now.  Activity - nothing recently.  Has 1 dog but does not walk dog regularly.  Relevant past medical, surgical, family and social history reviewed and updated. Allergies and medications reviewed and updated. Current Outpatient Prescriptions on File Prior to Visit  Medication Sig  . amLODipine (NORVASC) 5 MG tablet TAKE ONE (1) TABLET BY MOUTH EVERY DAY  . cetirizine (ZYRTEC) 10 MG tablet Take 10 mg by mouth at bedtime.  . clobetasol cream (TEMOVATE) 0.8.41 Apply 1 application topically 2 (two) times daily as needed (for rash.  Apply to affected area.).  . Marland Kitchenyanocobalamin (NASCOBAL) 500 MCG/0.1ML SOLN One spray in one nostril, once a week. Alternate nostrils.  . ferrous sulfate 325 (65 FE) MG tablet Take 1 tablet (325 mg total) by mouth 3 (three) times a week.  . fluticasone (FLONASE) 50 MCG/ACT nasal spray Place 2 sprays into the nose daily as needed for allergies.  . Marland Kitchenercaptopurine (PURINETHOL) 50 MG tablet Take 1 tablet (50 mg total) by mouth daily. Give on an empty stomach 1 hour before or 2 hours after meals. Caution: Chemotherapy.  . Marland Kitchenlopatadine (PATANOL) 0.1 % ophthalmic solution Place 1  drop into both eyes 2 (two) times daily as needed for allergies.   No current facility-administered medications on file prior to visit.    Review of Systems Per HPI unless specifically indicated above    Objective:    BP 142/80  Pulse 70  Temp(Src) 97.8 F (36.6 C) (Oral)  Wt 110 lb 8 oz (50.122 kg)  Physical Exam  Nursing note and vitals reviewed. Constitutional: She appears well-developed and well-nourished. No distress.  Psychiatric: She has a normal mood and affect. Her behavior is normal. Judgment and thought content normal.   Results for orders placed in visit on 09/25/13  VITAMIN B12      Result Value Range   Vitamin B-12 445  211 - 911 pg/mL  CBC WITH DIFFERENTIAL      Result Value Range   WBC 5.8  4.5 - 10.5 K/uL   RBC 3.77 (*) 3.87 - 5.11 Mil/uL   Hemoglobin 12.3  12.0 - 15.0 g/dL   HCT 36.9  36.0 - 46.0 %   MCV 97.9  78.0 - 100.0 fl   MCHC 33.3  30.0 - 36.0 g/dL   RDW 15.2 (*) 11.5 - 14.6 %   Platelets 276.0  150.0 - 400.0 K/uL   Neutrophils Relative % 53.0  43.0 - 77.0 %   Lymphocytes Relative 37.3  12.0 - 46.0 %   Monocytes Relative 7.5  3.0 - 12.0 %   Eosinophils Relative 1.6  0.0 - 5.0 %  Basophils Relative 0.6  0.0 - 3.0 %   Neutro Abs 3.1  1.4 - 7.7 K/uL   Lymphs Abs 2.2  0.7 - 4.0 K/uL   Monocytes Absolute 0.4  0.1 - 1.0 K/uL   Eosinophils Absolute 0.1  0.0 - 0.7 K/uL   Basophils Absolute 0.0  0.0 - 0.1 K/uL  IBC PANEL      Result Value Range   Iron 88  42 - 145 ug/dL   Transferrin 268.2  212.0 - 360.0 mg/dL   Saturation Ratios 23.4  20.0 - 50.0 %      Assessment & Plan:   Problem List Items Addressed This Visit   Anxiety and depression - Primary     Return of predominantly anxiety attributed to recent increased family and work stress. Pt self restarted celexa 42m and is doing well  Continue med. Discussed importance of healthy stress relieving strategies.    Crohn's disease of small intestine     Will change PPI from omeprazole to  pantoprazole 2/2 celexa interaction with former.  Pt agrees.        Follow up plan: Return as needed.

## 2013-12-05 NOTE — Patient Instructions (Signed)
Let's continue celexa at 51m daily as this is working well. Stop omeprazole, start protonix (pantoprazole) for heartburn. Good to see you today, call me if questions.

## 2013-12-05 NOTE — Assessment & Plan Note (Signed)
Return of predominantly anxiety attributed to recent increased family and work stress. Pt self restarted celexa 24m and is doing well  Continue med. Discussed importance of healthy stress relieving strategies.

## 2013-12-05 NOTE — Progress Notes (Signed)
Pre-visit discussion using our clinic review tool. No additional management support is needed unless otherwise documented below in the visit note.  

## 2013-12-05 NOTE — Assessment & Plan Note (Signed)
Will change PPI from omeprazole to pantoprazole 2/2 celexa interaction with former.  Pt agrees.

## 2013-12-15 ENCOUNTER — Telehealth: Payer: Self-pay | Admitting: Family Medicine

## 2013-12-15 ENCOUNTER — Encounter: Payer: Self-pay | Admitting: Family Medicine

## 2013-12-15 ENCOUNTER — Ambulatory Visit (INDEPENDENT_AMBULATORY_CARE_PROVIDER_SITE_OTHER): Payer: PRIVATE HEALTH INSURANCE | Admitting: Family Medicine

## 2013-12-15 VITALS — BP 122/70 | HR 76 | Temp 98.2°F | Wt 110.2 lb

## 2013-12-15 DIAGNOSIS — J019 Acute sinusitis, unspecified: Secondary | ICD-10-CM

## 2013-12-15 DIAGNOSIS — R109 Unspecified abdominal pain: Secondary | ICD-10-CM | POA: Insufficient documentation

## 2013-12-15 LAB — CBC WITH DIFFERENTIAL/PLATELET
Basophils Absolute: 0 10*3/uL (ref 0.0–0.1)
Basophils Relative: 0 % (ref 0–1)
Eosinophils Absolute: 0.1 10*3/uL (ref 0.0–0.7)
Eosinophils Relative: 2 % (ref 0–5)
HCT: 36.8 % (ref 36.0–46.0)
Hemoglobin: 12.5 g/dL (ref 12.0–15.0)
Lymphocytes Relative: 35 % (ref 12–46)
Lymphs Abs: 2.2 10*3/uL (ref 0.7–4.0)
MCH: 32.5 pg (ref 26.0–34.0)
MCHC: 34 g/dL (ref 30.0–36.0)
MCV: 95.6 fL (ref 78.0–100.0)
Monocytes Absolute: 0.5 10*3/uL (ref 0.1–1.0)
Monocytes Relative: 8 % (ref 3–12)
Neutro Abs: 3.5 10*3/uL (ref 1.7–7.7)
Neutrophils Relative %: 55 % (ref 43–77)
Platelets: 293 10*3/uL (ref 150–400)
RBC: 3.85 MIL/uL — ABNORMAL LOW (ref 3.87–5.11)
RDW: 14.7 % (ref 11.5–15.5)
WBC: 6.4 10*3/uL (ref 4.0–10.5)

## 2013-12-15 LAB — COMPREHENSIVE METABOLIC PANEL
ALBUMIN: 4.3 g/dL (ref 3.5–5.2)
ALK PHOS: 73 U/L (ref 39–117)
ALT: 61 U/L — AB (ref 0–35)
AST: 51 U/L — ABNORMAL HIGH (ref 0–37)
BILIRUBIN TOTAL: 0.7 mg/dL (ref 0.2–1.2)
BUN: 8 mg/dL (ref 6–23)
CO2: 26 mEq/L (ref 19–32)
Calcium: 8.8 mg/dL (ref 8.4–10.5)
Chloride: 104 mEq/L (ref 96–112)
Creat: 0.7 mg/dL (ref 0.50–1.10)
GLUCOSE: 82 mg/dL (ref 70–99)
POTASSIUM: 3.9 meq/L (ref 3.5–5.3)
SODIUM: 139 meq/L (ref 135–145)
TOTAL PROTEIN: 6.8 g/dL (ref 6.0–8.3)

## 2013-12-15 LAB — LIPASE: Lipase: 34 U/L (ref 0–75)

## 2013-12-15 MED ORDER — CIPROFLOXACIN HCL 500 MG PO TABS: 500.0000 mg | ORAL_TABLET | Freq: Two times a day (BID) | ORAL | Status: DC

## 2013-12-15 NOTE — Progress Notes (Signed)
BP 122/70  Pulse 76  Temp(Src) 98.2 F (36.8 C) (Oral)  Wt 110 lb 4 oz (50.009 kg)   CC: not feeling well  Subjective:    Patient ID: Michelle Vasquez, female    DOB: [birth year], 63 y.o.   MRN: [id number]  HPI: Michelle Vasquez is a 63 y.o. female presenting on 12/15/2013 with Fatigue and Hot Flashes   Seen here 10d ago with worsening stress/anxiety, restarted on celexa 82m daily and those sxs improved.    3 wks ago started with sinus sxs - drainage, runny nose, watery eyes, fever.  Since then has had persistent facial pressure and eye pain/pressure, nausea. +PNDrainage, congestion.  Anything she drinks makes her nauseated.  Now over last several weeks watery diarrhea has developed as well.  No formed stool.  No blood or mucous in stool.  Has tried to eat bland diet but this has not helped.  Feeling weak the last 2 days.  No recent fevers or cough.  No significant abd pain.  Last BM was diarrhea a few hours ago.  Passing gas fine.  Recently having some hot flashes and night sweats.  Feels worse when she goes to bed. Iron was decreased to three times a week - some diarrhea has developed since that change. With recent congestion she has not been able to tolerate intranasal B12.  Doesn't feel as well with intranasal B12.  Wt Readings from Last 3 Encounters:  12/15/13 110 lb 4 oz (50.009 kg)  12/05/13 110 lb 8 oz (50.122 kg)  06/12/13 116 lb (52.617 kg)    Relevant past medical, surgical, family and social history reviewed and updated. Allergies and medications reviewed and updated. Current Outpatient Prescriptions on File Prior to Visit  Medication Sig  . amLODipine (NORVASC) 5 MG tablet TAKE ONE (1) TABLET BY MOUTH EVERY DAY  . citalopram (CELEXA) 10 MG tablet Take 1 tablet (10 mg total) by mouth daily.  . clobetasol cream (TEMOVATE) 02.44% Apply 1 application topically 2 (two) times daily as needed (for rash.  Apply to affected area.).  .Marland KitchenCyanocobalamin (NASCOBAL) 500 MCG/0.1ML  SOLN One spray in one nostril, once a week. Alternate nostrils.  . ferrous sulfate 325 (65 FE) MG tablet Take 1 tablet (325 mg total) by mouth 3 (three) times a week.  . fluticasone (FLONASE) 50 MCG/ACT nasal spray Place 2 sprays into the nose daily as needed for allergies.  .Marland Kitchenmercaptopurine (PURINETHOL) 50 MG tablet Take 1 tablet (50 mg total) by mouth daily. Give on an empty stomach 1 hour before or 2 hours after meals. Caution: Chemotherapy.  .Marland Kitchenolopatadine (PATANOL) 0.1 % ophthalmic solution Place 1 drop into both eyes 2 (two) times daily as needed for allergies.  . pantoprazole (PROTONIX) 40 MG tablet Take 1 tablet (40 mg total) by mouth daily.  . cetirizine (ZYRTEC) 10 MG tablet Take 10 mg by mouth at bedtime.   No current facility-administered medications on file prior to visit.    Review of Systems Per HPI unless specifically indicated above    Objective:    BP 122/70  Pulse 76  Temp(Src) 98.2 F (36.8 C) (Oral)  Wt 110 lb 4 oz (50.009 kg)  Physical Exam  Nursing note and vitals reviewed. Constitutional: She appears well-developed and well-nourished. No distress.  HENT:  Head: Normocephalic and atraumatic.  Right Ear: Hearing, tympanic membrane, external ear and ear canal normal.  Left Ear: Hearing, tympanic membrane, external ear and ear canal normal.  Nose: No  mucosal edema or rhinorrhea. Right sinus exhibits maxillary sinus tenderness. Right sinus exhibits no frontal sinus tenderness. Left sinus exhibits maxillary sinus tenderness. Left sinus exhibits no frontal sinus tenderness.  Mouth/Throat: Uvula is midline, oropharynx is clear and moist and mucous membranes are normal. No oropharyngeal exudate, posterior oropharyngeal edema, posterior oropharyngeal erythema or tonsillar abscesses.  Eyes: Conjunctivae and EOM are normal. Pupils are equal, round, and reactive to light. No scleral icterus.  Neck: Normal range of motion. Neck supple.  Cardiovascular: Normal rate, regular  rhythm, normal heart sounds and intact distal pulses.   No murmur heard. Pulmonary/Chest: Effort normal and breath sounds normal. No respiratory distress. She has no wheezes. She has no rales.  Abdominal: Soft. Normal appearance and bowel sounds are normal. She exhibits no distension and no mass. There is tenderness in the right upper quadrant, right lower quadrant and suprapubic area. There is guarding (with palpation of RUQ) and positive Murphy's sign. There is no rigidity, no rebound and no CVA tenderness.  Lymphadenopathy:    She has no cervical adenopathy.  Skin: Skin is warm and dry. No rash noted.       Assessment & Plan:   Problem List Items Addressed This Visit   Acute sinusitis - Primary     Given duration of sxs (3 wks) will treat as bacterial sinusitis with 10d cipro course. Pt agrees with plan.    Relevant Medications      ciprofloxacin (CIPRO) tablet   Right sided abdominal pain     She does have R sided abd pain (RUQ as well as RLQ). With diarrheal change over last 2 weeks, concern for crohn's flare vs gallbladder etiology.  Will check CBC, CMP and lipase today. Start cipro course.  Consider imaging if not improved.  Advised update [place] and Dr. Olevia Perches if not improving or any worsening.    Relevant Orders      Comprehensive metabolic panel      CBC with Differential      Lipase       Follow up plan: Return if symptoms worsen or fail to improve.

## 2013-12-15 NOTE — Assessment & Plan Note (Signed)
She does have R sided abd pain (RUQ as well as RLQ). With diarrheal change over last 2 weeks, concern for crohn's flare vs gallbladder etiology.  Will check CBC, CMP and lipase today. Start cipro course.  Consider imaging if not improved.  Advised update [place] and Dr. Olevia Perches if not improving or any worsening.

## 2013-12-15 NOTE — Telephone Encounter (Signed)
error 

## 2013-12-15 NOTE — Patient Instructions (Signed)
I do think you have sinus infection - treat with cipro course for 10 days. I am worried about your abdominal pain today - we will draw blood work tdoay. If cipro isn't improving symptoms, please return to see Dr. Olevia Perches. Check with Dr. Olevia Perches about B12 shots.

## 2013-12-15 NOTE — Assessment & Plan Note (Signed)
Given duration of sxs (3 wks) will treat as bacterial sinusitis with 10d cipro course. Pt agrees with plan.

## 2013-12-15 NOTE — Progress Notes (Signed)
Pre-visit discussion using our clinic review tool. No additional management support is needed unless otherwise documented below in the visit note.  

## 2013-12-18 ENCOUNTER — Telehealth: Payer: Self-pay | Admitting: Internal Medicine

## 2013-12-18 ENCOUNTER — Other Ambulatory Visit: Payer: Self-pay | Admitting: Family Medicine

## 2013-12-18 DIAGNOSIS — R109 Unspecified abdominal pain: Secondary | ICD-10-CM

## 2013-12-18 NOTE — Telephone Encounter (Signed)
Patient states she has been sick for 3 weeks. She is having watery diarrhea, nausea and a sinus infection. Saw her PCP on Friday and was given Cipro. She is not better. Also, having RUQ cramping, pain. Labs at PCP did show slight elevation in LFT's. PCP is scheduling an [place] of abdomen. Scheduled with Dr. Olevia Perches tomorrow at 9:00 AM.

## 2013-12-19 ENCOUNTER — Ambulatory Visit: Payer: PRIVATE HEALTH INSURANCE | Admitting: Internal Medicine

## 2013-12-19 ENCOUNTER — Telehealth: Payer: Self-pay | Admitting: Internal Medicine

## 2013-12-19 NOTE — Telephone Encounter (Signed)
Left message for pt to call back.  Pt states she cannot eat or drink anything without having diarrhea. Pt requesting to be seen sooner than 1st available. Pt scheduled to see Tye Savoy NP tomorrow at 3:30pm, pt requests late appt.

## 2013-12-20 ENCOUNTER — Ambulatory Visit (INDEPENDENT_AMBULATORY_CARE_PROVIDER_SITE_OTHER): Payer: PRIVATE HEALTH INSURANCE | Admitting: Nurse Practitioner

## 2013-12-20 ENCOUNTER — Encounter: Payer: Self-pay | Admitting: Nurse Practitioner

## 2013-12-20 ENCOUNTER — Other Ambulatory Visit (INDEPENDENT_AMBULATORY_CARE_PROVIDER_SITE_OTHER): Payer: PRIVATE HEALTH INSURANCE

## 2013-12-20 ENCOUNTER — Ambulatory Visit: Payer: PRIVATE HEALTH INSURANCE | Admitting: Nurse Practitioner

## 2013-12-20 ENCOUNTER — Other Ambulatory Visit: Payer: PRIVATE HEALTH INSURANCE

## 2013-12-20 ENCOUNTER — Telehealth: Payer: Self-pay

## 2013-12-20 VITALS — BP 132/70 | HR 68 | Ht 60.0 in | Wt 109.4 lb

## 2013-12-20 DIAGNOSIS — R7989 Other specified abnormal findings of blood chemistry: Secondary | ICD-10-CM

## 2013-12-20 DIAGNOSIS — R110 Nausea: Secondary | ICD-10-CM

## 2013-12-20 DIAGNOSIS — R197 Diarrhea, unspecified: Secondary | ICD-10-CM

## 2013-12-20 DIAGNOSIS — R1011 Right upper quadrant pain: Secondary | ICD-10-CM

## 2013-12-20 DIAGNOSIS — K5000 Crohn's disease of small intestine without complications: Secondary | ICD-10-CM

## 2013-12-20 DIAGNOSIS — D509 Iron deficiency anemia, unspecified: Secondary | ICD-10-CM

## 2013-12-20 DIAGNOSIS — R945 Abnormal results of liver function studies: Secondary | ICD-10-CM

## 2013-12-20 LAB — CBC WITH DIFFERENTIAL/PLATELET
BASOS ABS: 0 10*3/uL (ref 0.0–0.1)
BASOS PCT: 0.7 % (ref 0.0–3.0)
EOS ABS: 0.1 10*3/uL (ref 0.0–0.7)
Eosinophils Relative: 1.3 % (ref 0.0–5.0)
HCT: 38 % (ref 36.0–46.0)
Hemoglobin: 12.7 g/dL (ref 12.0–15.0)
Lymphocytes Relative: 31 % (ref 12.0–46.0)
Lymphs Abs: 1.6 10*3/uL (ref 0.7–4.0)
MCHC: 33.4 g/dL (ref 30.0–36.0)
MCV: 99.7 fl (ref 78.0–100.0)
Monocytes Absolute: 0.3 10*3/uL (ref 0.1–1.0)
Monocytes Relative: 6.1 % (ref 3.0–12.0)
NEUTROS PCT: 60.9 % (ref 43.0–77.0)
Neutro Abs: 3.1 10*3/uL (ref 1.4–7.7)
Platelets: 271 10*3/uL (ref 150.0–400.0)
RBC: 3.82 Mil/uL — AB (ref 3.87–5.11)
RDW: 15.2 % — AB (ref 11.5–14.6)
WBC: 5.1 10*3/uL (ref 4.5–10.5)

## 2013-12-20 LAB — IBC PANEL
Iron: 96 ug/dL (ref 42–145)
SATURATION RATIOS: 26.4 % (ref 20.0–50.0)
TRANSFERRIN: 260 mg/dL (ref 212.0–360.0)

## 2013-12-20 NOTE — Patient Instructions (Signed)
We scheduled the Ultrasound at [facility], [place].   Friday 2-27- Arrive at 10:15 am.  We also scheduled you for a follow up with Dr. Olevia Perches on 01-11-2014 at 9:15 am.

## 2013-12-20 NOTE — Telephone Encounter (Signed)
Spoke with patient - recommended nasal saline irrigation as well as start claritin to dry up drainage. Update if sxs persist or fail to improve.

## 2013-12-20 NOTE — Telephone Encounter (Signed)
Pt was seen 12/15/13; taking antibiotic and began to feel better but today sinus symptoms are returning. Pt wants to know what to do? Unable to reach pt by phone for additional info.

## 2013-12-20 NOTE — Progress Notes (Signed)
     History of Present Illness:   This is a 63 year old white female with the longstanding Crohn's disease,s/p terminal ileal resection in 1981 and small bowel obstruction in 2008. She is maintained on 6-MP 50 mg daily. Last colonoscopy in May 2011 showed 50% narrowing at the anastomosis, biopsies confirmed ileitis. Prior colonoscopy in 2006 also showed narrow anastomosis.   Patient here with several GI issues. She has  chronic diarrhea but over the last 3 weeks has been having frequent episodes of explosive diarrhea as well as nocturnal diarrhea(non-bloody). Over the last 2 years she has had intermittent RUQ pain but pain now becoming more constant. She has been nauseated over the last month, has lost 5 pounds. No fever but she complains of chills. Recent LFTs pertinent for mild transaminitis. CBC on 12/15/13 was negative.   Current Medications, Allergies, Past Medical History, Past Surgical History, Family History and Social History were reviewed in [employer] record.   Physical Exam: General: Pleasant, well developed , white female in no acute distress Head: Normocephalic and atraumatic Eyes:  sclerae anicteric, conjunctiva pink  Ears: Normal auditory acuity Lungs: Clear throughout to auscultation Heart: Regular rate and rhythm Abdomen: Soft, non distended moderate RUQ tenderness. No masses, no hepatomegaly. Normal bowel sounds Musculoskeletal: Symmetrical with no gross deformities  Extremities: No edema  Neurological: Alert oriented x 4, grossly nonfocal Psychological:  Alert and cooperative. Normal mood and affect  Assessment and Recommendations:  1. Longstanding Crohn's disease, s/p remote terminal ileal resection. She is maintained on 54m 658mdaily. Now presenting with acute on chronic diarrhea, nausea, weight loss, chills, and acute on chronic RUQ pain. She doesn't feel like this is crohn's flare. Patient has several symptoms, all of which may or may not be  related. Will check stool for c-diff.  By exam she doesn't appear to be obstructed though partial bowel obstruction not excluded. For RUQ pain and mild transaminitis (labs by PCP) will get abdominal U/S. If u/s negative may need further imaging vrs endoscopic workup.  Follow up with Dr. BrOlevia Perchesn 2-3 weeks. In meantime will call patient will test results and further recommendations  2. Mildly elevated LFTs. Given presence of RUQ discomfort will get u/s.   3. Sinus infection, currently on Cipro. Diarrhea preceded antibiotics.

## 2013-12-20 NOTE — Telephone Encounter (Signed)
Pt has pain and pressure in face and drainage at back of throat; S/T on right side started 12/19/13 (scratchy and irritated throat from drainage) pt is still taking antibiotic. No fever or cough. Pt wants to know what to do. [place]. Pt request cb.

## 2013-12-22 ENCOUNTER — Encounter: Payer: Self-pay | Admitting: Nurse Practitioner

## 2013-12-22 ENCOUNTER — Other Ambulatory Visit: Payer: PRIVATE HEALTH INSURANCE

## 2013-12-22 LAB — CLOSTRIDIUM DIFFICILE BY PCR: Toxigenic C. Difficile by PCR: NOT DETECTED

## 2013-12-22 NOTE — Progress Notes (Signed)
Reviewed. i suspect bact OG diarrhea- ?? Start Flagyl 250 mg po tid x 10 days.

## 2013-12-26 ENCOUNTER — Ambulatory Visit
Admission: RE | Admit: 2013-12-26 | Discharge: 2013-12-26 | Disposition: A | Discharge status: Home / Self Care | Payer: PRIVATE HEALTH INSURANCE | Source: Ambulatory Visit | Attending: Nurse Practitioner | Admitting: Nurse Practitioner

## 2013-12-26 DIAGNOSIS — R1011 Right upper quadrant pain: Secondary | ICD-10-CM

## 2013-12-26 DIAGNOSIS — R110 Nausea: Secondary | ICD-10-CM

## 2013-12-26 DIAGNOSIS — R945 Abnormal results of liver function studies: Secondary | ICD-10-CM

## 2013-12-26 DIAGNOSIS — R7989 Other specified abnormal findings of blood chemistry: Secondary | ICD-10-CM

## 2013-12-28 ENCOUNTER — Telehealth: Payer: Self-pay | Admitting: *Deleted

## 2013-12-28 MED ORDER — METRONIDAZOLE 250 MG PO TABS: ORAL_TABLET | ORAL | Status: DC

## 2013-12-28 NOTE — Telephone Encounter (Signed)
Patient given results. She is still having diarrhea. Rx sent to pharmacy for Flagyl.

## 2013-12-28 NOTE — Telephone Encounter (Signed)
Message copied by Hulan Saas on Thu Dec 28, 2013  9:00 AM ------      Message from: Willia Craze      Created: Wed Dec 27, 2013  4:34 PM       Rollene Fare,       Will you see how patient feels. U/s negative and no c-diff. If still having diarrhea we could try course of flagyl 266m TID x 7 days for possible small bowel bacterial overgrowth. Thanks ------

## 2013-12-28 NOTE — Telephone Encounter (Signed)
Left a message for patient to call back re: results.

## 2014-01-02 ENCOUNTER — Emergency Department: Payer: Self-pay | Admitting: Emergency Medicine

## 2014-01-02 LAB — RAPID INFLUENZA A&B ANTIGENS

## 2014-01-03 ENCOUNTER — Ambulatory Visit: Payer: PRIVATE HEALTH INSURANCE | Admitting: Family Medicine

## 2014-01-05 ENCOUNTER — Telehealth: Payer: Self-pay | Admitting: Family Medicine

## 2014-01-05 MED ORDER — GUAIFENESIN ER 600 MG PO TB12: 600.0000 mg | ORAL_TABLET | Freq: Two times a day (BID) | ORAL | Status: DC

## 2014-01-05 NOTE — Telephone Encounter (Signed)
plz notify this was sent in.

## 2014-01-05 NOTE — Telephone Encounter (Signed)
Patient Information:  Caller Name: Danaija  Phone: [phone]  Patient: Michelle Vasquez  Gender: Female  DOB: 09-06-1951  Age: 62 Years  PCP: [name])  Office Follow Up:  Does the office need to follow up with this patient?: Yes  Instructions For The Office: Pt requesting Rx Mucinex called to pharmacy, thank you.  RN Note:  Pt would like to have Rx Mucinex called into her pharmacy due to Flex spending will not pay for OTC.  Please f/u with pt.  Symptoms  Reason For Call & Symptoms: Pt reports she is on Tamiflu, and having SOB, congestion, cough.  Pt reports feeling better.  Reviewed Health History In EMR: Yes  Reviewed Medications In EMR: Yes  Reviewed Allergies In EMR: Yes  Reviewed Surgeries / Procedures: Yes  Date of Onset of Symptoms: 12/29/2013  Guideline(s) Used:  Influenza Follow-Up Call  Disposition Per Guideline:   Home Care  Reason For Disposition Reached:   Influenza (diagnosed by HCP) and no complications  Advice Given:  Reassurance  Influenza is commonly known as the "flu".  Influenza is a respiratory illness that is easily spread from person-to-person. The most common symptoms are the sudden onset of fever, muscle aches, cough, runny nose, sore throat, fatigue and headache. For healthy people, the symptoms of influenza are similar to those of the common cold. However, with influenza, the onset is more abrupt and fever is higher. Feeling very sick for the first 3 days is common.  The treatment of influenza depends on your main symptoms. It is usually no different from that used for other viral respiratory infections. Most people who get sick with influenza get better at home without special treatment.  There are things that you can do to feel better and decrease your symptoms.  Here is some care advice that should help.  Influenza - Isolation Is Needed Until After Fever Is Gone:  If you have flu-like symptoms, please stay at home  until at least 24 hours after you are free of fever.  Do Not go to work or school.  Do Not go to church, child care centers, shopping, or other public places.  Do Not shake hands.  Do Not share eating utensils (e.g., spoon, fork)  Avoid close contact with others (hugging, kissing).  Call Back If  Difficulty breathing  Fever lasts more than 3 days  Runny nose lasts more than 14 days  Cough lasts more than 3 weeks  You become worse.  Patient Will Follow Care Advice:  YES

## 2014-01-05 NOTE — Telephone Encounter (Signed)
Patient notified

## 2014-01-10 ENCOUNTER — Encounter: Payer: Self-pay | Admitting: Family Medicine

## 2014-01-10 ENCOUNTER — Ambulatory Visit (INDEPENDENT_AMBULATORY_CARE_PROVIDER_SITE_OTHER): Payer: PRIVATE HEALTH INSURANCE | Admitting: Family Medicine

## 2014-01-10 VITALS — BP 114/70 | HR 74 | Temp 98.3°F | Wt 110.0 lb

## 2014-01-10 DIAGNOSIS — K5000 Crohn's disease of small intestine without complications: Secondary | ICD-10-CM

## 2014-01-10 DIAGNOSIS — J111 Influenza due to unidentified influenza virus with other respiratory manifestations: Secondary | ICD-10-CM | POA: Insufficient documentation

## 2014-01-10 DIAGNOSIS — J019 Acute sinusitis, unspecified: Secondary | ICD-10-CM

## 2014-01-10 NOTE — Assessment & Plan Note (Signed)
Persistent congestion despite recent cipro course, but hesitant to provide with another abx course at this time. Will monitor sxs for now, if no improvement would consider augmentin or doxy course +/- oral prednisone.

## 2014-01-10 NOTE — Patient Instructions (Signed)
I do think this cough is residual from flu.  Continue treatment as up to now, add zyrtec.  Continue pushing fluids and rest. Let us know if fever returns or worsening cough or if you'd like prescription strength cough syrup

## 2014-01-10 NOTE — Progress Notes (Signed)
Pre visit review using our clinic review tool, if applicable. No additional management support is needed unless otherwise documented below in the visit note. 

## 2014-01-10 NOTE — Assessment & Plan Note (Signed)
Agree with dx despite neg swab given sxs and normal CXR - pt completed tamiflu course, slowly improving. Declines cough syrup today, but will call us if changes her mind - and states has tolerated codeine in past.  rec start zyrtec for PNDrainage component.

## 2014-01-10 NOTE — Progress Notes (Signed)
BP 114/70  Pulse 74  Temp(Src) 98.3 F (36.8 C) (Oral)  Wt 110 lb (49.896 kg)  SpO2 97%   CC: ER F/U  Subjective:    Patient ID: Michelle Vasquez, female    DOB: September 12, 1951, 63 y.o.   MRN: [id number]  HPI: Michelle Vasquez is a 63 y.o. female presenting on 01/10/2014 for Follow-up   Longstanding Crohn's disease,s/p terminal ileal resection in 1981 and small bowel obstruction in 2008.  Seen here 12/15/2013 with dx sinusitis and RLQ pain - treated with cipro course. Seen by GI later that week, ?bacterial overgrowth and treated with flagyl course as well (completed 8 days).  CBC WNL, mild transamitis, stable [place] (?fatty liver), neg C diff.  Diarrhea improved after cipro/flagyl.  GI sxs have significantly improved.  She seeked care at [facility] on 01/02/2014 and was diagnosed with influenza despite negative flu swab.  CXR WNL.  All records reviewed.  Pt presented to hospital with body aches, fever 102.5, cough, congestion. Treated with tamiflu and tylenol, now feeling better.  Also used mucinex.  Still persistent congestion with cough but slowly improving.  Fever gone since Thursday.  Some night sweats.  Trouble sleeping 2/2 head congestion.  Also tried delsym without improvement.  Ongoing congestion/pressure since mid January, treated 2/20 with cipro course for presumed acute sinusitis which improved sxs but now may be returning.   Flu shot done this year. No sick contacts  Relevant past medical, surgical, family and social history reviewed and updated as indicated.  Allergies and medications reviewed and updated. Current Outpatient Prescriptions on File Prior to Visit  Medication Sig  . amLODipine (NORVASC) 5 MG tablet TAKE ONE (1) TABLET BY MOUTH EVERY DAY  . cetirizine (ZYRTEC) 10 MG tablet Take 10 mg by mouth at bedtime.  . Cyanocobalamin (NASCOBAL) 500 MCG/0.1ML SOLN One spray in one nostril, once a week. Alternate nostrils.  . ferrous sulfate 325 (65 FE) MG tablet Take 1 tablet (325 mg  total) by mouth 3 (three) times a week.  Marland Kitchen guaiFENesin (MUCINEX) 600 MG 12 hr tablet Take 1 tablet (600 mg total) by mouth 2 (two) times daily.  . mercaptopurine (PURINETHOL) 50 MG tablet Take 1 tablet (50 mg total) by mouth daily. Give on an empty stomach 1 hour before or 2 hours after meals. Caution: Chemotherapy.  Marland Kitchen olopatadine (PATANOL) 0.1 % ophthalmic solution Place 1 drop into both eyes 2 (two) times daily as needed for allergies.   No current facility-administered medications on file prior to visit.    Review of Systems Per HPI unless specifically indicated above    Objective:    BP 114/70  Pulse 74  Temp(Src) 98.3 F (36.8 C) (Oral)  Wt 110 lb (49.896 kg)  SpO2 97%  Physical Exam  Nursing note and vitals reviewed. Constitutional: She appears well-developed and well-nourished. No distress.  HENT:  Head: Normocephalic and atraumatic.  Right Ear: Hearing, tympanic membrane, external ear and ear canal normal.  Left Ear: Hearing, tympanic membrane, external ear and ear canal normal.  Nose: Mucosal edema present. No rhinorrhea. Right sinus exhibits no maxillary sinus tenderness and no frontal sinus tenderness. Left sinus exhibits no maxillary sinus tenderness and no frontal sinus tenderness.  Mouth/Throat: Uvula is midline, oropharynx is clear and moist and mucous membranes are normal. No oropharyngeal exudate, posterior oropharyngeal edema, posterior oropharyngeal erythema or tonsillar abscesses.  Evident congestion  Eyes: Conjunctivae and EOM are normal. Pupils are equal, round, and reactive to light. No scleral icterus.  Neck: Normal range of motion. Neck supple.  Cardiovascular: Normal rate, regular rhythm, normal heart sounds and intact distal pulses.   No murmur heard. Pulmonary/Chest: Effort normal and breath sounds normal. No respiratory distress. She has no wheezes. She has no rales.  Lymphadenopathy:    She has no cervical adenopathy.  Skin: Skin is warm and dry. No  rash noted.       Assessment & Plan:   Problem List Items Addressed This Visit   Acute sinusitis     Persistent congestion despite recent cipro course, but hesitant to provide with another abx course at this time. Will monitor sxs for now, if no improvement would consider augmentin or doxy course +/- oral prednisone.    Crohn's disease of small intestine     Seems relatively stable currently.    Influenza - Primary     Agree with dx despite neg swab given sxs and normal CXR - pt completed tamiflu course, slowly improving. Declines cough syrup today, but will call us if changes her mind - and states has tolerated codeine in past.  rec start zyrtec for PNDrainage component.        Follow up plan: Return if symptoms worsen or fail to improve.

## 2014-01-10 NOTE — Assessment & Plan Note (Signed)
Seems relatively stable currently.

## 2014-01-11 ENCOUNTER — Ambulatory Visit: Payer: PRIVATE HEALTH INSURANCE | Admitting: Internal Medicine

## 2014-01-30 ENCOUNTER — Ambulatory Visit: Payer: PRIVATE HEALTH INSURANCE | Admitting: Internal Medicine

## 2014-02-06 ENCOUNTER — Ambulatory Visit (INDEPENDENT_AMBULATORY_CARE_PROVIDER_SITE_OTHER): Payer: PRIVATE HEALTH INSURANCE | Admitting: Family Medicine

## 2014-02-06 ENCOUNTER — Encounter: Payer: Self-pay | Admitting: Family Medicine

## 2014-02-06 VITALS — BP 124/78 | HR 76 | Temp 98.2°F | Wt 111.8 lb

## 2014-02-06 DIAGNOSIS — F329 Major depressive disorder, single episode, unspecified: Secondary | ICD-10-CM

## 2014-02-06 DIAGNOSIS — F419 Anxiety disorder, unspecified: Principal | ICD-10-CM

## 2014-02-06 DIAGNOSIS — F341 Dysthymic disorder: Secondary | ICD-10-CM

## 2014-02-06 DIAGNOSIS — Z639 Problem related to primary support group, unspecified: Secondary | ICD-10-CM

## 2014-02-06 MED ORDER — SERTRALINE HCL 25 MG PO TABS: 25.0000 mg | ORAL_TABLET | Freq: Every day | ORAL | Status: DC

## 2014-02-06 NOTE — Assessment & Plan Note (Addendum)
Deterioration of mood issue predominantly depression. celexa caused headaches and racing thoughts. PHQ9 = 21/27, very difficult to function GAD7 = 19/21 Discussed treatment options - did not respond to celexa well.  Will try sertraline 27m nightly with optino to increase.  Discussed common side effects as well as possible worsening with SI prior to improvement.  If feeling ill to notify [place]. Also discussed counseling and pt interested in this.  Referral made today and contact # provided to schedule appt with Dr. PRexene Edisonor TClint Bolderfor CBT. rtc 1 mo for f/u.  Pt agrees with plan.  A total of 25 minutes were spent face-to-face with the patient during this encounter and over half of that time was spent on counseling and coordination of care

## 2014-02-06 NOTE — Progress Notes (Signed)
BP 124/78  Pulse 76  Temp(Src) 98.2 F (36.8 C) (Oral)  Wt 111 lb 12 oz (50.689 kg)   CC: discuss mood  Subjective:    Patient ID: Michelle Vasquez, female    DOB: 1951-04-24, 63 y.o.   MRN: [id number]  HPI: Michelle Vasquez is a 63 y.o. female presenting on 02/06/2014 for Discuss medication   Increasing sadness and depressed mood along with fatigue noted over last several weeks.  Some underlying dysthymia over last several years but acutely worse these past several weeks. No motivation to get up in morning.  Feels not work getting up in the morning.  Wants to quit job, feels drained (clerical). Trouble sleeping at night but feels sleepy.  Energy level down, concentration decreased, some guilt feelings about her life, appetite increased.  No SI/HI.  Some passive thoughts. Still enjoys working in yard.  Has not seen counselor in past but would be amenable to this.  No visual or auditory hallucinations.  Did lost several close friends recently (5 ppl in last 6 months).  This has hit her harder.  Trial of celexa in the past didn't really help.  Lab Results  Component Value Date   TSH 3.29 04/09/2010    Lab Results  Component Value Date   IRWERXVQ00 867 09/25/2013   Wt Readings from Last 3 Encounters:  02/06/14 111 lb 12 oz (50.689 kg)  01/10/14 110 lb (49.896 kg)  12/20/13 109 lb 6.4 oz (49.624 kg)    Past Medical History  Diagnosis Date  . HTN (hypertension)   . Crohn disease     s/p colectomy on 6MP, ?bacterial overgrowth  . History of small bowel obstruction   . Osteomalacia, unspecified   . Unspecified deficiency anemia   . B12 deficiency   . Internal hemorrhoids without mention of complication   . Unspecified polyarthropathy or polyarthritis, multiple sites   . Central hearing loss   . Perennial allergic rhinitis with seasonal variation   . Fatty liver 12/15/11  . Anxiety and depression 07/03/2011     Relevant past medical, surgical, family and social history  reviewed and updated as indicated.  Allergies and medications reviewed and updated. Current Outpatient Prescriptions on File Prior to Visit  Medication Sig  . amLODipine (NORVASC) 5 MG tablet TAKE ONE (1) TABLET BY MOUTH EVERY DAY  . cetirizine (ZYRTEC) 10 MG tablet Take 10 mg by mouth at bedtime.  . Cyanocobalamin (NASCOBAL) 500 MCG/0.1ML SOLN One spray in one nostril, once a week. Alternate nostrils.  . ferrous sulfate 325 (65 FE) MG tablet Take 1 tablet (325 mg total) by mouth 3 (three) times a week.  . mercaptopurine (PURINETHOL) 50 MG tablet Take 1 tablet (50 mg total) by mouth daily. Give on an empty stomach 1 hour before or 2 hours after meals. Caution: Chemotherapy.  Marland Kitchen olopatadine (PATANOL) 0.1 % ophthalmic solution Place 1 drop into both eyes 2 (two) times daily as needed for allergies.  Marland Kitchen omeprazole (PRILOSEC) 40 MG capsule Take 40 mg by mouth daily.  Marland Kitchen guaiFENesin (MUCINEX) 600 MG 12 hr tablet Take 1 tablet (600 mg total) by mouth 2 (two) times daily.   No current facility-administered medications on file prior to visit.    Review of Systems Per HPI unless specifically indicated above    Objective:    BP 124/78  Pulse 76  Temp(Src) 98.2 F (36.8 C) (Oral)  Wt 111 lb 12 oz (50.689 kg)  Physical Exam  Nursing note and vitals  reviewed. Constitutional: She appears well-developed and well-nourished. No distress.  Psychiatric: Her behavior is normal. Judgment and thought content normal. She exhibits a depressed mood.  Flattened affect Some tears with discussing mood       Assessment & Plan:   Problem List Items Addressed This Visit   Unspecified family circumstance   Anxiety and depression - Primary     Deterioration of mood issue predominantly depression. celexa caused headaches and racing thoughts. PHQ9 = 21/27, very difficult to function GAD7 = 19/21 Discussed treatment options - did not respond to celexa well.  Will try sertraline 29m nightly with optino to  increase.  Discussed common side effects as well as possible worsening with SI prior to improvement.  If feeling ill to notify [place]. Also discussed counseling and pt interested in this.  Referral made today and contact # provided to schedule appt with Dr. PRexene Edisonor TClint Bolderfor CBT. rtc 1 mo for f/u.  Pt agrees with plan.  A total of 25 minutes were spent face-to-face with the patient during this encounter and over half of that time was spent on counseling and coordination of care    Relevant Medications      sertraline (ZOLOFT) tablet   Other Relevant Orders      Ambulatory referral to Psychology       Follow up plan: Return in about 1 month (around 03/08/2014), or if symptoms worsen or fail to improve, for follow up visit.

## 2014-02-06 NOTE — Patient Instructions (Signed)
I'm sorry you're having trouble with the mood. Let's start sertraline 33m daily. I would like to have you see one of our counselors as well - call number provided today to set up an appointment. Call me with any questions. Return to see me in 1-2 months for follow up.

## 2014-02-06 NOTE — Progress Notes (Signed)
Pre visit review using our clinic review tool, if applicable. No additional management support is needed unless otherwise documented below in the visit note. 

## 2014-02-07 ENCOUNTER — Other Ambulatory Visit: Payer: Self-pay | Admitting: *Deleted

## 2014-02-07 MED ORDER — CYANOCOBALAMIN 500 MCG/0.1ML NA SOLN: NASAL | Status: DC

## 2014-02-27 ENCOUNTER — Other Ambulatory Visit: Payer: Self-pay | Admitting: *Deleted

## 2014-02-27 MED ORDER — MERCAPTOPURINE 50 MG PO TABS: 50.0000 mg | ORAL_TABLET | Freq: Every day | ORAL | Status: DC

## 2014-03-01 ENCOUNTER — Ambulatory Visit (INDEPENDENT_AMBULATORY_CARE_PROVIDER_SITE_OTHER): Payer: PRIVATE HEALTH INSURANCE | Admitting: Psychology

## 2014-03-01 DIAGNOSIS — F339 Major depressive disorder, recurrent, unspecified: Secondary | ICD-10-CM

## 2014-03-09 ENCOUNTER — Other Ambulatory Visit: Payer: Self-pay | Admitting: *Deleted

## 2014-03-09 ENCOUNTER — Ambulatory Visit (INDEPENDENT_AMBULATORY_CARE_PROVIDER_SITE_OTHER): Payer: PRIVATE HEALTH INSURANCE | Admitting: Family Medicine

## 2014-03-09 ENCOUNTER — Encounter: Payer: Self-pay | Admitting: Family Medicine

## 2014-03-09 VITALS — BP 130/80 | HR 80 | Temp 98.1°F | Wt 112.2 lb

## 2014-03-09 DIAGNOSIS — F419 Anxiety disorder, unspecified: Principal | ICD-10-CM

## 2014-03-09 DIAGNOSIS — F341 Dysthymic disorder: Secondary | ICD-10-CM

## 2014-03-09 DIAGNOSIS — F329 Major depressive disorder, single episode, unspecified: Secondary | ICD-10-CM

## 2014-03-09 MED ORDER — SERTRALINE HCL 50 MG PO TABS: 50.0000 mg | ORAL_TABLET | Freq: Every day | ORAL | Status: DC

## 2014-03-09 MED ORDER — OLOPATADINE HCL 0.1 % OP SOLN: 1.0000 [drp] | Freq: Two times a day (BID) | OPHTHALMIC | Status: DC | PRN

## 2014-03-09 MED ORDER — CYANOCOBALAMIN 500 MCG/0.1ML NA SOLN: NASAL | Status: DC

## 2014-03-09 NOTE — Assessment & Plan Note (Addendum)
PHQ9 = 21 --> 22/27, very difficult to function  GAD7 = 19 --> 18/21  Some subjective improvement noted - but no improvement on questionairres.  Will increase sertraline to 21m daily, I asked patient ot call me with update in 2 weeks and at that time will decide whether increase to 1045mdaily is warranted. Pt agrees with plan. F/u left open ended.

## 2014-03-09 NOTE — Progress Notes (Signed)
Pre visit review using our clinic review tool, if applicable. No additional management support is needed unless otherwise documented below in the visit note. 

## 2014-03-09 NOTE — Progress Notes (Signed)
   BP 130/80  Pulse 80  Temp(Src) 98.1 F (36.7 C) (Oral)  Wt 112 lb 4 oz (50.916 kg)   CC: 1 mo f/u mood  Subjective:    Patient ID: Michelle Vasquez, female    DOB: 1951/09/22, 63 y.o.   MRN: [id number]  HPI: Michelle Vasquez is a 63 y.o. female presenting on 03/09/2014 for Follow-up   See prior note for details.  Briefly, several week to month h/o worsening mood discussed last visit. At that time, started on sertraline 53m daily and referred for counseling.   Presents today for f/u.   Sleeping better, feeling better regarding mood.  Energy level staying down.  Continued depression and impending doom. Has seen Terri x1.  F/u scheduled for end of month.  Relevant past medical, surgical, family and social history reviewed and updated as indicated.  Allergies and medications reviewed and updated. Current Outpatient Prescriptions on File Prior to Visit  Medication Sig  . amLODipine (NORVASC) 5 MG tablet TAKE ONE (1) TABLET BY MOUTH EVERY DAY  . cetirizine (ZYRTEC) 10 MG tablet Take 10 mg by mouth at bedtime.  . Cyanocobalamin (NASCOBAL) 500 MCG/0.1ML SOLN One spray in one nostril, once a week. Alternate nostrils.  . ferrous sulfate 325 (65 FE) MG tablet Take 1 tablet (325 mg total) by mouth 3 (three) times a week.  . mercaptopurine (PURINETHOL) 50 MG tablet Take 1 tablet (50 mg total) by mouth daily. Give on an empty stomach 1 hour before or 2 hours after meals. Caution: Chemotherapy.  .Marland Kitchenomeprazole (PRILOSEC) 40 MG capsule Take 40 mg by mouth daily.  .Marland KitchenguaiFENesin (MUCINEX) 600 MG 12 hr tablet Take 1 tablet (600 mg total) by mouth 2 (two) times daily.  .Marland Kitchenolopatadine (PATANOL) 0.1 % ophthalmic solution Place 1 drop into both eyes 2 (two) times daily as needed for allergies.   No current facility-administered medications on file prior to visit.    Review of Systems Per HPI unless specifically indicated above    Objective:    BP 130/80  Pulse 80  Temp(Src) 98.1 F (36.7 C)  (Oral)  Wt 112 lb 4 oz (50.916 kg)  Physical Exam  Nursing note and vitals reviewed. Constitutional: She appears well-developed and well-nourished. No distress.  Psychiatric: She has a normal mood and affect. Her behavior is normal. Judgment and thought content normal.  Good eye contact, smiles and laughs       Assessment & Plan:   Problem List Items Addressed This Visit   Anxiety and depression - Primary     PHQ9 = 21 --> 22/27, very difficult to function  GAD7 = 19 --> 18/21  Some subjective improvement noted - but no improvement on questionairres.  Will increase sertraline to 57mdaily, I asked patient ot call me with update in 2 weeks and at that time will decide whether increase to 1007maily is warranted. Pt agrees with plan. F/u left open ended.    Relevant Medications      sertraline (ZOLOFT) tablet       Follow up plan: Return if symptoms worsen or fail to improve.

## 2014-03-09 NOTE — Patient Instructions (Addendum)
Take sertraline 53m daily for the next 2 weeks then reassess - if persistent depressed mood, call me and we will increase to 1089mdaily. Call me with an update in 2 weeks. Continue follow up with Terri.

## 2014-03-13 ENCOUNTER — Other Ambulatory Visit (INDEPENDENT_AMBULATORY_CARE_PROVIDER_SITE_OTHER): Payer: PRIVATE HEALTH INSURANCE

## 2014-03-13 ENCOUNTER — Encounter: Payer: Self-pay | Admitting: Internal Medicine

## 2014-03-13 ENCOUNTER — Ambulatory Visit (INDEPENDENT_AMBULATORY_CARE_PROVIDER_SITE_OTHER): Payer: PRIVATE HEALTH INSURANCE | Admitting: Internal Medicine

## 2014-03-13 VITALS — BP 122/78 | HR 72 | Ht 60.0 in | Wt 114.2 lb

## 2014-03-13 DIAGNOSIS — K5090 Crohn's disease, unspecified, without complications: Secondary | ICD-10-CM

## 2014-03-13 LAB — CBC WITH DIFFERENTIAL/PLATELET
BASOS ABS: 0 10*3/uL (ref 0.0–0.1)
Basophils Relative: 0.6 % (ref 0.0–3.0)
EOS ABS: 0.1 10*3/uL (ref 0.0–0.7)
Eosinophils Relative: 1.9 % (ref 0.0–5.0)
HCT: 35.5 % — ABNORMAL LOW (ref 36.0–46.0)
Hemoglobin: 12 g/dL (ref 12.0–15.0)
LYMPHS PCT: 35.7 % (ref 12.0–46.0)
Lymphs Abs: 2.2 10*3/uL (ref 0.7–4.0)
MCHC: 34 g/dL (ref 30.0–36.0)
MCV: 98.3 fl (ref 78.0–100.0)
Monocytes Absolute: 0.5 10*3/uL (ref 0.1–1.0)
Monocytes Relative: 8.1 % (ref 3.0–12.0)
NEUTROS PCT: 53.7 % (ref 43.0–77.0)
Neutro Abs: 3.4 10*3/uL (ref 1.4–7.7)
Platelets: 296 10*3/uL (ref 150.0–400.0)
RBC: 3.61 Mil/uL — ABNORMAL LOW (ref 3.87–5.11)
RDW: 15.7 % — AB (ref 11.5–15.5)
WBC: 6.3 10*3/uL (ref 4.0–10.5)

## 2014-03-13 NOTE — Progress Notes (Signed)
Michelle Vasquez 05-Mar-1951 [id number]  Note: This dictation was prepared with Dragon digital system. Any transcriptional errors that result from this procedure are unintentional.   History of Present Illness:  This is a 63 year old white female with Crohn's disease ,remote  TI resection and an anastomotic stricture. Her last colonoscopy in May 2011 showed a 50% obstruction at the anastomosis. Biopsies showed chronic active ileitis. Prior colonoscopies confirmed stricture in 04/05/2007. She was here 2 months ago with severe diarrhea. She responded to Flagyl 250 mg 3 times a day for 10 days. Her stools are currently formed and regular. She denies abdominal pain or rectal bleeding.    Past Medical History  Diagnosis Date  . HTN (hypertension)   . Crohn disease     s/p colectomy on 6MP, ?bacterial overgrowth  . History of small bowel obstruction   . Osteomalacia, unspecified   . Unspecified deficiency anemia   . B12 deficiency   . Internal hemorrhoids without mention of complication   . Unspecified polyarthropathy or polyarthritis, multiple sites   . Central hearing loss   . Perennial allergic rhinitis with seasonal variation   . Fatty liver 12/15/11  . Anxiety and depression 07/03/2011    Past Surgical History  Procedure Laterality Date  . Total abdominal hysterectomy  1975    ovaries out as well  . Appendectomy    . Other surgical history  1960s, 1978, 1980    ileal resection x 3 and subtotal colon resection  . Esophagogastroduodenoscopy  02/25/05    Dilated stricture  . Colonoscopy  02/25/05    Internal hemms; crohns; narrow anast  . Colonoscopy  02/26/10    Prior right hemicolectomy o/w benign (Dr. Olevia Perches)  . Hemicolectomy  1980    all small intestine, none large  . Dexa  07/06/2011    normal T score -1.0 femur, spine    Allergies  Allergen Reactions  . Celexa [Citalopram Hydrobromide] Other (See Comments)    Racing thoughts, couldn't sleep    Family history and social  history have been reviewed.  Review of Systems:   The remainder of the 10 point ROS is negative except as outlined in the H&P  Physical Exam: General Appearance Well developed, in no distress Eyes  Non icteric  HEENT  Non traumatic, normocephalic  Mouth No lesion, tongue papillated, no cheilosis Neck Supple without adenopathy, thyroid not enlarged, no carotid bruits, no JVD Lungs Clear to auscultation bilaterally COR Normal S1, normal S2, regular rhythm, no murmur, quiet precordium Abdomen mild tenderness in right lower quadrant. No fullness. Normoactive bowel sounds Rectal not done Extremities  No pedal edema Skin No lesions Neurological Alert and oriented x 3 Psychological Normal mood and affect  Assessment and Plan:   Problem #1 Crohn's disease at the ileocolic anastomosis, 65%  obstruction. She is clinically not obstructed. Her episodes of diarrhea were most likely infectious. She responded to Flagyl 250 mg 3 times a day. She will continue on 6-MP 50 mg daily. We are checking her CBC and B12 levels. I will see her in one year    [place] Dragon 03/13/2014

## 2014-03-13 NOTE — Patient Instructions (Signed)
Your physician has requested that you go to the basement for the following lab work before leaving today: CBC  Please continue your 10m.  Please follow up with Dr BOlevia Perchesin 1 year.  Cc:Dr JRia Bush

## 2014-03-14 NOTE — Addendum Note (Signed)
Addended by: Larina Bras on: 03/14/2014 08:43 AM   Modules accepted: Orders

## 2014-03-15 ENCOUNTER — Ambulatory Visit (INDEPENDENT_AMBULATORY_CARE_PROVIDER_SITE_OTHER): Payer: PRIVATE HEALTH INSURANCE | Admitting: Psychology

## 2014-03-15 DIAGNOSIS — F39 Unspecified mood [affective] disorder: Secondary | ICD-10-CM

## 2014-03-27 ENCOUNTER — Other Ambulatory Visit: Payer: Self-pay | Admitting: *Deleted

## 2014-03-27 MED ORDER — MERCAPTOPURINE 50 MG PO TABS: 50.0000 mg | ORAL_TABLET | Freq: Every day | ORAL | Status: DC

## 2014-04-05 ENCOUNTER — Ambulatory Visit (INDEPENDENT_AMBULATORY_CARE_PROVIDER_SITE_OTHER): Payer: PRIVATE HEALTH INSURANCE | Admitting: Psychology

## 2014-04-05 DIAGNOSIS — F339 Major depressive disorder, recurrent, unspecified: Secondary | ICD-10-CM

## 2014-04-06 ENCOUNTER — Encounter: Payer: Self-pay | Admitting: Internal Medicine

## 2014-04-06 ENCOUNTER — Other Ambulatory Visit (INDEPENDENT_AMBULATORY_CARE_PROVIDER_SITE_OTHER): Payer: PRIVATE HEALTH INSURANCE

## 2014-04-06 DIAGNOSIS — K5090 Crohn's disease, unspecified, without complications: Secondary | ICD-10-CM

## 2014-04-06 LAB — VITAMIN B12: Vitamin B-12: 425 pg/mL (ref 211–911)

## 2014-04-06 NOTE — Telephone Encounter (Signed)
Error

## 2014-04-09 ENCOUNTER — Other Ambulatory Visit: Payer: Self-pay | Admitting: *Deleted

## 2014-04-09 DIAGNOSIS — E538 Deficiency of other specified B group vitamins: Secondary | ICD-10-CM

## 2014-04-20 ENCOUNTER — Ambulatory Visit: Payer: PRIVATE HEALTH INSURANCE | Admitting: Psychology

## 2014-05-07 ENCOUNTER — Telehealth: Payer: Self-pay | Admitting: Family Medicine

## 2014-05-07 NOTE — Telephone Encounter (Signed)
error 

## 2014-05-10 ENCOUNTER — Ambulatory Visit (INDEPENDENT_AMBULATORY_CARE_PROVIDER_SITE_OTHER): Payer: PRIVATE HEALTH INSURANCE | Admitting: Psychology

## 2014-05-10 ENCOUNTER — Encounter: Payer: Self-pay | Admitting: Family Medicine

## 2014-05-10 ENCOUNTER — Ambulatory Visit (INDEPENDENT_AMBULATORY_CARE_PROVIDER_SITE_OTHER): Payer: PRIVATE HEALTH INSURANCE | Admitting: Family Medicine

## 2014-05-10 VITALS — BP 124/64 | HR 76 | Temp 98.2°F | Wt 114.2 lb

## 2014-05-10 DIAGNOSIS — F339 Major depressive disorder, recurrent, unspecified: Secondary | ICD-10-CM

## 2014-05-10 DIAGNOSIS — E538 Deficiency of other specified B group vitamins: Secondary | ICD-10-CM

## 2014-05-10 DIAGNOSIS — F341 Dysthymic disorder: Secondary | ICD-10-CM

## 2014-05-10 DIAGNOSIS — F32A Depression, unspecified: Secondary | ICD-10-CM

## 2014-05-10 DIAGNOSIS — F419 Anxiety disorder, unspecified: Secondary | ICD-10-CM

## 2014-05-10 DIAGNOSIS — F329 Major depressive disorder, single episode, unspecified: Secondary | ICD-10-CM

## 2014-05-10 MED ORDER — B-12 500 MCG PO TABS: 500.0000 ug | ORAL_TABLET | Freq: Every day | ORAL | Status: DC

## 2014-05-10 NOTE — Progress Notes (Signed)
   BP 124/64  Pulse 76  Temp(Src) 98.2 F (36.8 C) (Oral)  Wt 114 lb 4 oz (51.823 kg)   CC: discuss b12 and fatigue  Subjective:    Patient ID: Michelle Vasquez, female    DOB: July 22, 1951, 63 y.o.   MRN: [id number]  HPI: Michelle Vasquez is a 63 y.o. female presenting on 05/10/2014 for Fatigue   Pt endorses feeling more tired lately. Also with trouble concentrating/focusing. Lacks motivation to get things done.  Recently recommended stopping B12 by GI 2/2 normal levels last several checks.  Prior was receiving B12 monthly injections for h/o crohn's then weekly nasal b12.    Also recently started and then increased sertraline to 40m daily for worsening depression. Feels she is doing well at this dose.  Relevant past medical, surgical, family and social history reviewed and updated as indicated.  Allergies and medications reviewed and updated. Current Outpatient Prescriptions on File Prior to Visit  Medication Sig  . amLODipine (NORVASC) 5 MG tablet TAKE ONE (1) TABLET BY MOUTH EVERY DAY  . cetirizine (ZYRTEC) 10 MG tablet Take 10 mg by mouth at bedtime.  . ferrous sulfate 325 (65 FE) MG tablet Take 1 tablet (325 mg total) by mouth 3 (three) times a week.  . mercaptopurine (PURINETHOL) 50 MG tablet Take 1 tablet (50 mg total) by mouth daily. Give on an empty stomach 1 hour before or 2 hours after meals. Caution: Chemotherapy.  .Marland Kitchenolopatadine (PATANOL) 0.1 % ophthalmic solution Place 1 drop into both eyes 2 (two) times daily as needed for allergies.  .Marland Kitchenomeprazole (PRILOSEC) 40 MG capsule Take 40 mg by mouth daily.  . sertraline (ZOLOFT) 50 MG tablet Take 1 tablet (50 mg total) by mouth daily.  .Marland KitchenguaiFENesin (MUCINEX) 600 MG 12 hr tablet Take 1 tablet (600 mg total) by mouth 2 (two) times daily.   No current facility-administered medications on file prior to visit.    Review of Systems Per HPI unless specifically indicated above    Objective:    BP 124/64  Pulse 76  Temp(Src)  98.2 F (36.8 C) (Oral)  Wt 114 lb 4 oz (51.823 kg)  Physical Exam  Nursing note and vitals reviewed. Constitutional: She appears well-developed and well-nourished. No distress.  Psychiatric: She has a normal mood and affect.   Results for orders placed in visit on 04/06/14  VITAMIN B12      Result Value Ref Range   Vitamin B-12 425  211 - 911 pg/mL      Assessment & Plan:   Problem List Items Addressed This Visit   VITAMIN B12 DEFICIENCY - Primary     H/o crohn's with hemicolectomy. Ok to take oral b12 5048m daily. Pt agrees with plan. Aware unsure degree of absorption with oral B12.    Anxiety and depression     Stable on sertraline 5050maily. Continue.        Follow up plan: Return if symptoms worsen or fail to improve.

## 2014-05-10 NOTE — Assessment & Plan Note (Signed)
H/o crohn's with hemicolectomy. Ok to take oral b12 571mg daily. Pt agrees with plan. Aware unsure degree of absorption with oral B12.

## 2014-05-10 NOTE — Assessment & Plan Note (Signed)
Stable on sertraline 65m daily. Continue.

## 2014-05-10 NOTE — Progress Notes (Signed)
Pre visit review using our clinic review tool, if applicable. No additional management support is needed unless otherwise documented below in the visit note. 

## 2014-05-10 NOTE — Patient Instructions (Signed)
Ok to start B12 oral supplement at 580mg daily. Good to see you today, call uKoreawith questions

## 2014-05-27 ENCOUNTER — Other Ambulatory Visit: Payer: Self-pay | Admitting: Family Medicine

## 2014-05-27 DIAGNOSIS — I10 Essential (primary) hypertension: Secondary | ICD-10-CM

## 2014-05-27 DIAGNOSIS — E538 Deficiency of other specified B group vitamins: Secondary | ICD-10-CM

## 2014-05-27 DIAGNOSIS — K5000 Crohn's disease of small intestine without complications: Secondary | ICD-10-CM

## 2014-06-03 ENCOUNTER — Other Ambulatory Visit: Payer: Self-pay | Admitting: Family Medicine

## 2014-06-04 ENCOUNTER — Other Ambulatory Visit: Payer: Self-pay | Admitting: Family Medicine

## 2014-06-04 ENCOUNTER — Other Ambulatory Visit (INDEPENDENT_AMBULATORY_CARE_PROVIDER_SITE_OTHER): Payer: PRIVATE HEALTH INSURANCE

## 2014-06-04 DIAGNOSIS — D509 Iron deficiency anemia, unspecified: Secondary | ICD-10-CM

## 2014-06-04 DIAGNOSIS — K5000 Crohn's disease of small intestine without complications: Secondary | ICD-10-CM

## 2014-06-04 DIAGNOSIS — E538 Deficiency of other specified B group vitamins: Secondary | ICD-10-CM

## 2014-06-04 DIAGNOSIS — I10 Essential (primary) hypertension: Secondary | ICD-10-CM

## 2014-06-04 DIAGNOSIS — Z1231 Encounter for screening mammogram for malignant neoplasm of breast: Secondary | ICD-10-CM

## 2014-06-04 DIAGNOSIS — Z0000 Encounter for general adult medical examination without abnormal findings: Secondary | ICD-10-CM

## 2014-06-04 LAB — COMPREHENSIVE METABOLIC PANEL
ALBUMIN: 3.8 g/dL (ref 3.5–5.2)
ALK PHOS: 64 U/L (ref 39–117)
ALT: 33 U/L (ref 0–35)
AST: 31 U/L (ref 0–37)
BILIRUBIN TOTAL: 0.8 mg/dL (ref 0.2–1.2)
BUN: 10 mg/dL (ref 6–23)
CO2: 27 mEq/L (ref 19–32)
Calcium: 9.3 mg/dL (ref 8.4–10.5)
Chloride: 105 mEq/L (ref 96–112)
Creatinine, Ser: 0.7 mg/dL (ref 0.4–1.2)
GFR: 85.37 mL/min (ref >60.00)
GLUCOSE: 78 mg/dL (ref 70–99)
POTASSIUM: 4.1 meq/L (ref 3.5–5.1)
SODIUM: 139 meq/L (ref 135–145)
Total Protein: 7.3 g/dL (ref 6.0–8.3)

## 2014-06-04 LAB — TSH: TSH: 2.81 u[IU]/mL (ref 0.35–4.50)

## 2014-06-04 LAB — VITAMIN B12: VITAMIN B 12: 241 pg/mL (ref 211–911)

## 2014-06-05 ENCOUNTER — Telehealth: Payer: Self-pay | Admitting: Family Medicine

## 2014-06-05 ENCOUNTER — Ambulatory Visit
Admission: RE | Admit: 2014-06-05 | Discharge: 2014-06-05 | Disposition: A | Discharge status: Home / Self Care | Payer: PRIVATE HEALTH INSURANCE | Source: Ambulatory Visit | Attending: Family Medicine | Admitting: Family Medicine

## 2014-06-05 DIAGNOSIS — Z1231 Encounter for screening mammogram for malignant neoplasm of breast: Secondary | ICD-10-CM

## 2014-06-05 LAB — HM MAMMOGRAPHY: HM Mammogram: NORMAL

## 2014-06-05 NOTE — Telephone Encounter (Signed)
Relevant patient education assigned to patient using Emmi. ° °

## 2014-06-06 ENCOUNTER — Encounter: Payer: Self-pay | Admitting: *Deleted

## 2014-06-11 ENCOUNTER — Encounter: Payer: Self-pay | Admitting: Family Medicine

## 2014-06-11 ENCOUNTER — Ambulatory Visit (INDEPENDENT_AMBULATORY_CARE_PROVIDER_SITE_OTHER): Payer: PRIVATE HEALTH INSURANCE | Admitting: Family Medicine

## 2014-06-11 VITALS — BP 122/78 | HR 76 | Temp 98.0°F | Ht 60.0 in | Wt 111.5 lb

## 2014-06-11 DIAGNOSIS — I10 Essential (primary) hypertension: Secondary | ICD-10-CM

## 2014-06-11 DIAGNOSIS — Z23 Encounter for immunization: Secondary | ICD-10-CM

## 2014-06-11 DIAGNOSIS — F32A Depression, unspecified: Secondary | ICD-10-CM

## 2014-06-11 DIAGNOSIS — F329 Major depressive disorder, single episode, unspecified: Secondary | ICD-10-CM

## 2014-06-11 DIAGNOSIS — F341 Dysthymic disorder: Secondary | ICD-10-CM

## 2014-06-11 DIAGNOSIS — Z0000 Encounter for general adult medical examination without abnormal findings: Secondary | ICD-10-CM

## 2014-06-11 DIAGNOSIS — H16229 Keratoconjunctivitis sicca, not specified as Sjogren's, unspecified eye: Secondary | ICD-10-CM

## 2014-06-11 DIAGNOSIS — H04129 Dry eye syndrome of unspecified lacrimal gland: Secondary | ICD-10-CM | POA: Insufficient documentation

## 2014-06-11 DIAGNOSIS — E538 Deficiency of other specified B group vitamins: Secondary | ICD-10-CM

## 2014-06-11 DIAGNOSIS — H16223 Keratoconjunctivitis sicca, not specified as Sjogren's, bilateral: Secondary | ICD-10-CM

## 2014-06-11 DIAGNOSIS — K5000 Crohn's disease of small intestine without complications: Secondary | ICD-10-CM

## 2014-06-11 DIAGNOSIS — F419 Anxiety disorder, unspecified: Secondary | ICD-10-CM

## 2014-06-11 LAB — SEDIMENTATION RATE: Sed Rate: 22 mm/hr (ref 0–22)

## 2014-06-11 NOTE — Assessment & Plan Note (Signed)
Stable, continue regimen.

## 2014-06-11 NOTE — Patient Instructions (Signed)
Tdap today (tetanus and pertussis). Blood work today. Call your insurance about the shingles shot to see if it is covered or how much it would cost and where is cheaper (here or pharmacy).  If you want to receive here, call for nurse visit. Good to see you today, call us with questions.

## 2014-06-11 NOTE — Addendum Note (Signed)
Addended by: Royann Shivers A on: 06/11/2014 10:02 AM   Modules accepted: Orders

## 2014-06-11 NOTE — Progress Notes (Signed)
BP 122/78  Pulse 76  Temp(Src) 98 F (36.7 C) (Oral)  Ht 5' (1.524 m)  Wt 111 lb 8 oz (50.576 kg)  BMI 21.78 kg/m2   CC: CPE  Subjective:    Patient ID: Michelle Vasquez, female    DOB: 10-02-1951, 63 y.o.   MRN: [id number]  HPI: Michelle Vasquez is a 63 y.o. female presenting on 06/11/2014 for Annual Exam   Brings script from [facility] suggesting ESR, RF, ANA to r/o autoimmune process (primary or secondary sjogrens) 2/2 minimal tear production. Known h/o crohn's disease.  Considering restasis. Currently on eye lubricant drops regularly. + joint pains (hands and bilateral TMJ popping when eating).  Some dry mouth. + dysphagia - food gets stuck.  Seat belt use discussed.  Sunscreen use discussed. No suspicious moles.  Preventative: Colonoscopy last 2011, rpt in 10 yrs. Sees Dr. Olevia Perches every year for blood work due to MP.  Pap - s/p total hysterectomy, ovaries removed at age 65yo, for possible fibroids. Took hormone replacement for a few months, not after that.  Breast exam - Last mammo 05/2014, Birads 1. Rpt due 1 year.  Td 2005. Tdap today Flu shot yearly at work.  Completed Hep B series.  Pneumovax 2014.  zostavax - discussed, pt will check with insurance.  PPD at work yearly.   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  Allergies and medications reviewed and updated. Current Outpatient Prescriptions on File Prior to Visit  Medication Sig  . amLODipine (NORVASC) 5 MG tablet TAKE 1 TABLET BY MOUTH DAILY  . cetirizine (ZYRTEC) 10 MG tablet Take 10 mg by mouth at bedtime.  . ferrous sulfate 325 (65 FE) MG tablet Take 1 tablet (325 mg total) by mouth 3 (three) times a week.  . mercaptopurine (PURINETHOL) 50 MG tablet  Take 1 tablet (50 mg total) by mouth daily. Give on an empty stomach 1 hour before or 2 hours after meals. Caution: Chemotherapy.  Marland Kitchen olopatadine (PATANOL) 0.1 % ophthalmic solution Place 1 drop into both eyes 2 (two) times daily as needed for allergies.  Marland Kitchen omeprazole (PRILOSEC) 40 MG capsule Take 40 mg by mouth daily.  . sertraline (ZOLOFT) 50 MG tablet Take 1 tablet (50 mg total) by mouth daily.  Marland Kitchen guaiFENesin (MUCINEX) 600 MG 12 hr tablet Take 1 tablet (600 mg total) by mouth 2 (two) times daily.   No current facility-administered medications on file prior to visit.    Review of Systems  Constitutional: Negative for fever, chills, activity change, appetite change, fatigue and unexpected weight change.  HENT: Negative for hearing loss.   Eyes: Negative for visual disturbance.  Respiratory: Negative for cough, chest tightness, shortness of breath and wheezing.   Cardiovascular: Negative for chest pain, palpitations and leg swelling.  Gastrointestinal: Positive for abdominal pain (crohn's), diarrhea and constipation. Negative for nausea, vomiting, blood in stool and abdominal distention.  Genitourinary: Negative for hematuria and difficulty urinating.  Musculoskeletal: Negative for arthralgias, myalgias and neck pain.  Skin: Negative for rash.  Neurological: Positive for dizziness (for last few weeks, now improving) and headaches (persistent sinus headaches (facial pressure)). Negative for seizures and syncope.  Hematological: Negative for adenopathy. Does not bruise/bleed easily.  Psychiatric/Behavioral: Negative for dysphoric mood. The patient is not  nervous/anxious.    Per HPI unless specifically indicated above    Objective:    BP 122/78  Pulse 76  Temp(Src) 98 F (36.7 C) (Oral)  Ht 5' (1.524 m)  Wt 111 lb 8 oz (50.576 kg)  BMI 21.78 kg/m2  Physical Exam  Nursing note and vitals reviewed. Constitutional: She is oriented to person, place, and time. She appears well-developed  and well-nourished. No distress.  HENT:  Head: Normocephalic and atraumatic.  Right Ear: Hearing, tympanic membrane, external ear and ear canal normal.  Left Ear: Hearing, tympanic membrane, external ear and ear canal normal.  Nose: Nose normal.  Mouth/Throat: Uvula is midline, oropharynx is clear and moist and mucous membranes are normal. No oropharyngeal exudate, posterior oropharyngeal edema or posterior oropharyngeal erythema.  Eyes: Conjunctivae and EOM are normal. Pupils are equal, round, and reactive to light. No scleral icterus.  Neck: Normal range of motion. Neck supple. Carotid bruit is not present. No thyromegaly present.  Cardiovascular: Normal rate, regular rhythm, normal heart sounds and intact distal pulses.   No murmur heard. Pulses:      Radial pulses are 2+ on the right side, and 2+ on the left side.  Pulmonary/Chest: Effort normal and breath sounds normal. No respiratory distress. She has no wheezes. She has no rales.  Abdominal: Soft. Bowel sounds are normal. She exhibits no distension and no mass. There is tenderness (stays sore to palpation (chronic)). There is no rebound and no guarding.  Musculoskeletal: Normal range of motion. She exhibits no edema.  Lymphadenopathy:    She has no cervical adenopathy.  Neurological: She is alert and oriented to person, place, and time.  CN grossly intact, station and gait intact  Skin: Skin is warm and dry. No rash noted.  Psychiatric: She has a normal mood and affect. Her behavior is normal. Judgment and thought content normal.   Results for orders placed in visit on 06/11/14  HM DEXA SCAN      Result Value Ref Range   HM Dexa Scan normal        Assessment & Plan:   Problem List Items Addressed This Visit   VITAMIN B12 DEFICIENCY     Increase oral B12 to 1056mg daily. May ultimately need to restart nasal or shots    ESSENTIAL HYPERTENSION     Stable, continue regimen.    Crohn's disease of small intestine     Stable  on MP. Followed by GI.    Healthcare maintenance - Primary     Preventative protocols reviewed and updated unless pt declined. Discussed healthy diet and lifestyle.    Anxiety and depression     Doing well on low dose sertraline.    Dry eyes due to decreased tear production     Will check labwork per ophtho recs as well (ANA, RF, ESR, and SSA/SSB).    Relevant Orders      ANA      Sedimentation rate      Rheumatoid factor      Sjogren's syndrome antibods(ssa + ssb)       Follow up plan: Return in about 6 months (around 12/12/2014), or as needed, for follow up visit.

## 2014-06-11 NOTE — Progress Notes (Signed)
Pre visit review using our clinic review tool, if applicable. No additional management support is needed unless otherwise documented below in the visit note. 

## 2014-06-11 NOTE — Assessment & Plan Note (Signed)
Increase oral B12 to 1083mg daily. May ultimately need to restart nasal or shots

## 2014-06-11 NOTE — Assessment & Plan Note (Signed)
Stable on MP. Followed by GI.

## 2014-06-11 NOTE — Assessment & Plan Note (Signed)
Doing well on low dose sertraline.

## 2014-06-11 NOTE — Assessment & Plan Note (Signed)
Preventative protocols reviewed and updated unless pt declined. Discussed healthy diet and lifestyle.  

## 2014-06-11 NOTE — Assessment & Plan Note (Signed)
Will check labwork per ophtho recs as well (ANA, RF, ESR, and SSA/SSB).

## 2014-06-12 LAB — SJOGREN'S SYNDROME ANTIBODS(SSA + SSB)
SSA (Ro) (ENA) Antibody, IgG: <1
SSB (LA) (ENA) ANTIBODY, IGG: NEGATIVE

## 2014-06-12 LAB — ANA: Anti Nuclear Antibody(ANA): NEGATIVE

## 2014-06-12 LAB — RHEUMATOID FACTOR: Rhuematoid fact SerPl-aCnc: <10 IU/mL (ref <=14)

## 2014-06-14 ENCOUNTER — Ambulatory Visit (INDEPENDENT_AMBULATORY_CARE_PROVIDER_SITE_OTHER): Payer: PRIVATE HEALTH INSURANCE | Admitting: Psychology

## 2014-06-14 DIAGNOSIS — F339 Major depressive disorder, recurrent, unspecified: Secondary | ICD-10-CM

## 2014-06-18 ENCOUNTER — Encounter: Payer: Self-pay | Admitting: Family Medicine

## 2014-06-22 ENCOUNTER — Ambulatory Visit (INDEPENDENT_AMBULATORY_CARE_PROVIDER_SITE_OTHER): Payer: PRIVATE HEALTH INSURANCE | Admitting: Family Medicine

## 2014-06-22 ENCOUNTER — Encounter: Payer: Self-pay | Admitting: Family Medicine

## 2014-06-22 VITALS — BP 114/68 | HR 64 | Temp 98.2°F | Wt 113.5 lb

## 2014-06-22 DIAGNOSIS — K5000 Crohn's disease of small intestine without complications: Secondary | ICD-10-CM

## 2014-06-22 DIAGNOSIS — M130 Polyarthritis, unspecified: Secondary | ICD-10-CM

## 2014-06-22 MED ORDER — NAPROXEN 375 MG PO TABS: ORAL_TABLET | ORAL | Status: DC

## 2014-06-22 NOTE — Assessment & Plan Note (Signed)
Stable on MP.

## 2014-06-22 NOTE — Progress Notes (Signed)
Pre visit review using our clinic review tool, if applicable. No additional management support is needed unless otherwise documented below in the visit note. 

## 2014-06-22 NOTE — Progress Notes (Signed)
BP 114/68  Pulse 64  Temp(Src) 98.2 F (36.8 C) (Oral)  Wt 113 lb 8 oz (51.483 kg)  SpO2 97%   CC: arthralgias  Subjective:    Patient ID: Grace Blight, female    DOB: 1951/10/07, 63 y.o.   MRN: [id number]  HPI: RIONNA FELTES is a 63 y.o. female presenting on 06/22/2014 for Hip Pain and Arm Pain   At beginning of week, started having R knee pain with locking and instability then had to R hip and ankle. Persistent arthralgias and body aches.  Improved with laying supine, aggravated with upright movement. Now with L knee pain that started this morning, R knee is doing better. Denies significant warmth or redness or swelling of joints. Also has some arthralgias of bilateral fingers (MCPs) and elbows and L shoulder and neck.  So far tried hot showers, no meds.  Known h/o Crohn's disease on MP.  Denies fevers/chills, rashes, nausea, tick bites.  No vision changes, no red eyes. Some sinus congestion.  Recent autoimmune work up recommended by ophthalmology unrevealing (Ro Ab, La Ab, ANA, RF, ESR).  Relevant past medical, surgical, family and social history reviewed and updated as indicated.  Allergies and medications reviewed and updated. Current Outpatient Prescriptions on File Prior to Visit  Medication Sig  . amLODipine (NORVASC) 5 MG tablet TAKE 1 TABLET BY MOUTH DAILY  . cetirizine (ZYRTEC) 10 MG tablet Take 10 mg by mouth at bedtime.  . cyanocobalamin 1000 MCG tablet Take 100 mcg by mouth daily. Vit B12  . ferrous sulfate 325 (65 FE) MG tablet Take 1 tablet (325 mg total) by mouth 3 (three) times a week.  . mercaptopurine (PURINETHOL) 50 MG tablet Take 1 tablet (50 mg total) by mouth daily. Give on an empty stomach 1 hour before or 2 hours after meals. Caution: Chemotherapy.  Marland Kitchen olopatadine (PATANOL) 0.1 % ophthalmic solution Place 1 drop into both eyes 2 (two) times daily as needed for allergies.  Marland Kitchen omeprazole (PRILOSEC) 40 MG capsule Take 40 mg by mouth daily.  . sertraline  (ZOLOFT) 50 MG tablet Take 1 tablet (50 mg total) by mouth daily.   No current facility-administered medications on file prior to visit.    Review of Systems Per HPI unless specifically indicated above    Objective:    BP 114/68  Pulse 64  Temp(Src) 98.2 F (36.8 C) (Oral)  Wt 113 lb 8 oz (51.483 kg)  SpO2 97%  Physical Exam  Nursing note and vitals reviewed. Constitutional: She appears well-developed and well-nourished. No distress.  Eyes: Conjunctivae and EOM are normal. Pupils are equal, round, and reactive to light. No scleral icterus.  Musculoskeletal: She exhibits no edema.  Neg SLR bilaterally. No pain with int/ext rotation at hip. Pos FABER with pain at lateral thighs R>L. ++ pain at SIJ bilaterally, no pain at GTB or sciatic notch bilaterally. Tender to palpation along joint lines bilateral knees, but FROM without erythema or effusion present. Neg mcmurray's, no ligament laxity, no popliteal fullness, no PFgrind No active synovitis.  Skin: Skin is warm and dry. No rash noted.   Results for orders placed in visit on 06/11/14  HM DEXA SCAN      Result Value Ref Range   HM Dexa Scan normal    ANA      Result Value Ref Range   ANA NEG  NEGATIVE  SEDIMENTATION RATE      Result Value Ref Range   Sed Rate 22  0 -  22 mm/hr  RHEUMATOID FACTOR      Result Value Ref Range   Rheumatoid Factor <10  <=14 IU/mL  SJOGREN'S SYNDROME ANTIBODS(SSA + SSB)      Result Value Ref Range   SSA (Ro) (ENA) Antibody, IgG <1.0 NEG  <1.0 NEG AI   SSB (La) (ENA) Antibody, IgG <1.0 NEG  <1.0 NEG AI      Assessment & Plan:   Problem List Items Addressed This Visit   Crohn's disease of small intestine     Stable on MP.     POLYARTHRITIS NOS, MULTIPLE SITES - Primary     Anticipate axial spondylitis/sacroiliitis and peripheral arthropathy as complications of chrohn's disease Pt has never tried NSAIDs due to IBD - will trial medium dose naprosyn 369m bid with strict precautions that if  any worsening GI sxs will need to stop NSAID immediately. Also discussed possible sulfazalasine trial if NSAIDs ineffective or intolerable. Pt declines stronger pain medication. Discussed option for rheum referral - pt will monitor for now.        Follow up plan: Return if symptoms worsen or fail to improve.

## 2014-06-22 NOTE — Patient Instructions (Addendum)
I think we have arthritis from crohn's disease. May try anti inflammatory naprosyn 327m twice daily with meals for 5 days then as needed for pain. If not improving with this, let me know for trial of different medicine and referral to rheumatologist. Good to see you today, call uKoreawith questions.

## 2014-06-22 NOTE — Assessment & Plan Note (Signed)
Anticipate axial spondylitis/sacroiliitis and peripheral arthropathy as complications of chrohn's disease Pt has never tried NSAIDs due to IBD - will trial medium dose naprosyn 374m bid with strict precautions that if any worsening GI sxs will need to stop NSAID immediately. Also discussed possible sulfazalasine trial if NSAIDs ineffective or intolerable. Pt declines stronger pain medication. Discussed option for rheum referral - pt will monitor for now.

## 2014-07-06 ENCOUNTER — Other Ambulatory Visit: Payer: Self-pay | Admitting: Family Medicine

## 2014-07-06 ENCOUNTER — Encounter: Payer: Self-pay | Admitting: Family Medicine

## 2014-07-06 DIAGNOSIS — F419 Anxiety disorder, unspecified: Principal | ICD-10-CM

## 2014-07-06 DIAGNOSIS — F329 Major depressive disorder, single episode, unspecified: Secondary | ICD-10-CM

## 2014-07-06 MED ORDER — SERTRALINE HCL 100 MG PO TABS: 100.0000 mg | ORAL_TABLET | Freq: Every day | ORAL | Status: DC

## 2014-07-06 NOTE — Telephone Encounter (Signed)
Please see Mychart message from patient.

## 2014-07-19 ENCOUNTER — Ambulatory Visit (INDEPENDENT_AMBULATORY_CARE_PROVIDER_SITE_OTHER): Payer: PRIVATE HEALTH INSURANCE | Admitting: Psychology

## 2014-07-19 DIAGNOSIS — F339 Major depressive disorder, recurrent, unspecified: Secondary | ICD-10-CM

## 2014-08-13 ENCOUNTER — Encounter: Payer: Self-pay | Admitting: Family Medicine

## 2014-08-13 MED ORDER — SCOPOLAMINE 1 MG/3DAYS TD PT72: 1.0000 | MEDICATED_PATCH | TRANSDERMAL | Status: DC

## 2014-08-13 NOTE — Telephone Encounter (Signed)
Please see Mychart message.

## 2014-08-16 ENCOUNTER — Ambulatory Visit (INDEPENDENT_AMBULATORY_CARE_PROVIDER_SITE_OTHER): Payer: PRIVATE HEALTH INSURANCE | Admitting: Psychology

## 2014-08-16 DIAGNOSIS — F339 Major depressive disorder, recurrent, unspecified: Secondary | ICD-10-CM

## 2014-08-20 IMAGING — CR DG CHEST 2V
1 series · 2 of 2 positions shown · non-contrast
Comparison: none

[Series 1: w chest pa · 0.14mm/px · 2 of 2 slices shown]
[im 1/2]
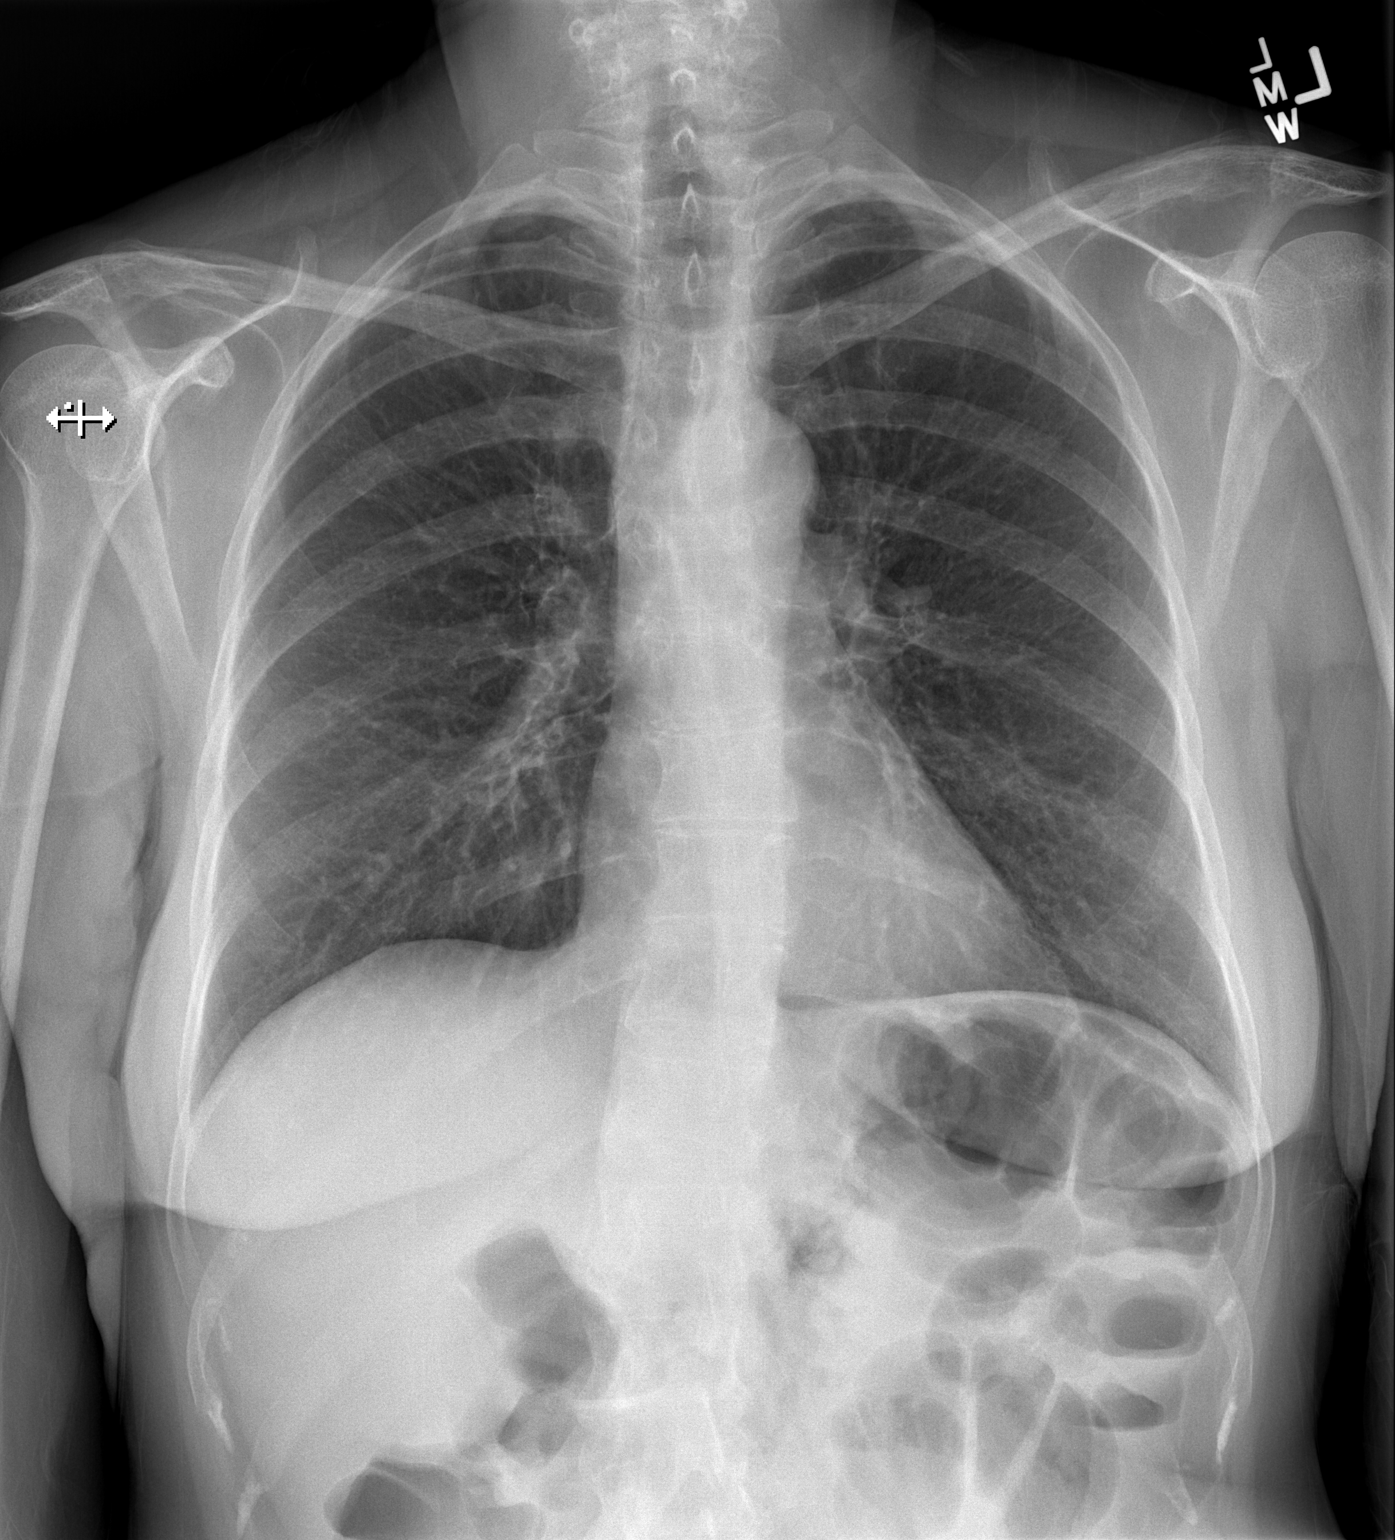
[im 2/2]
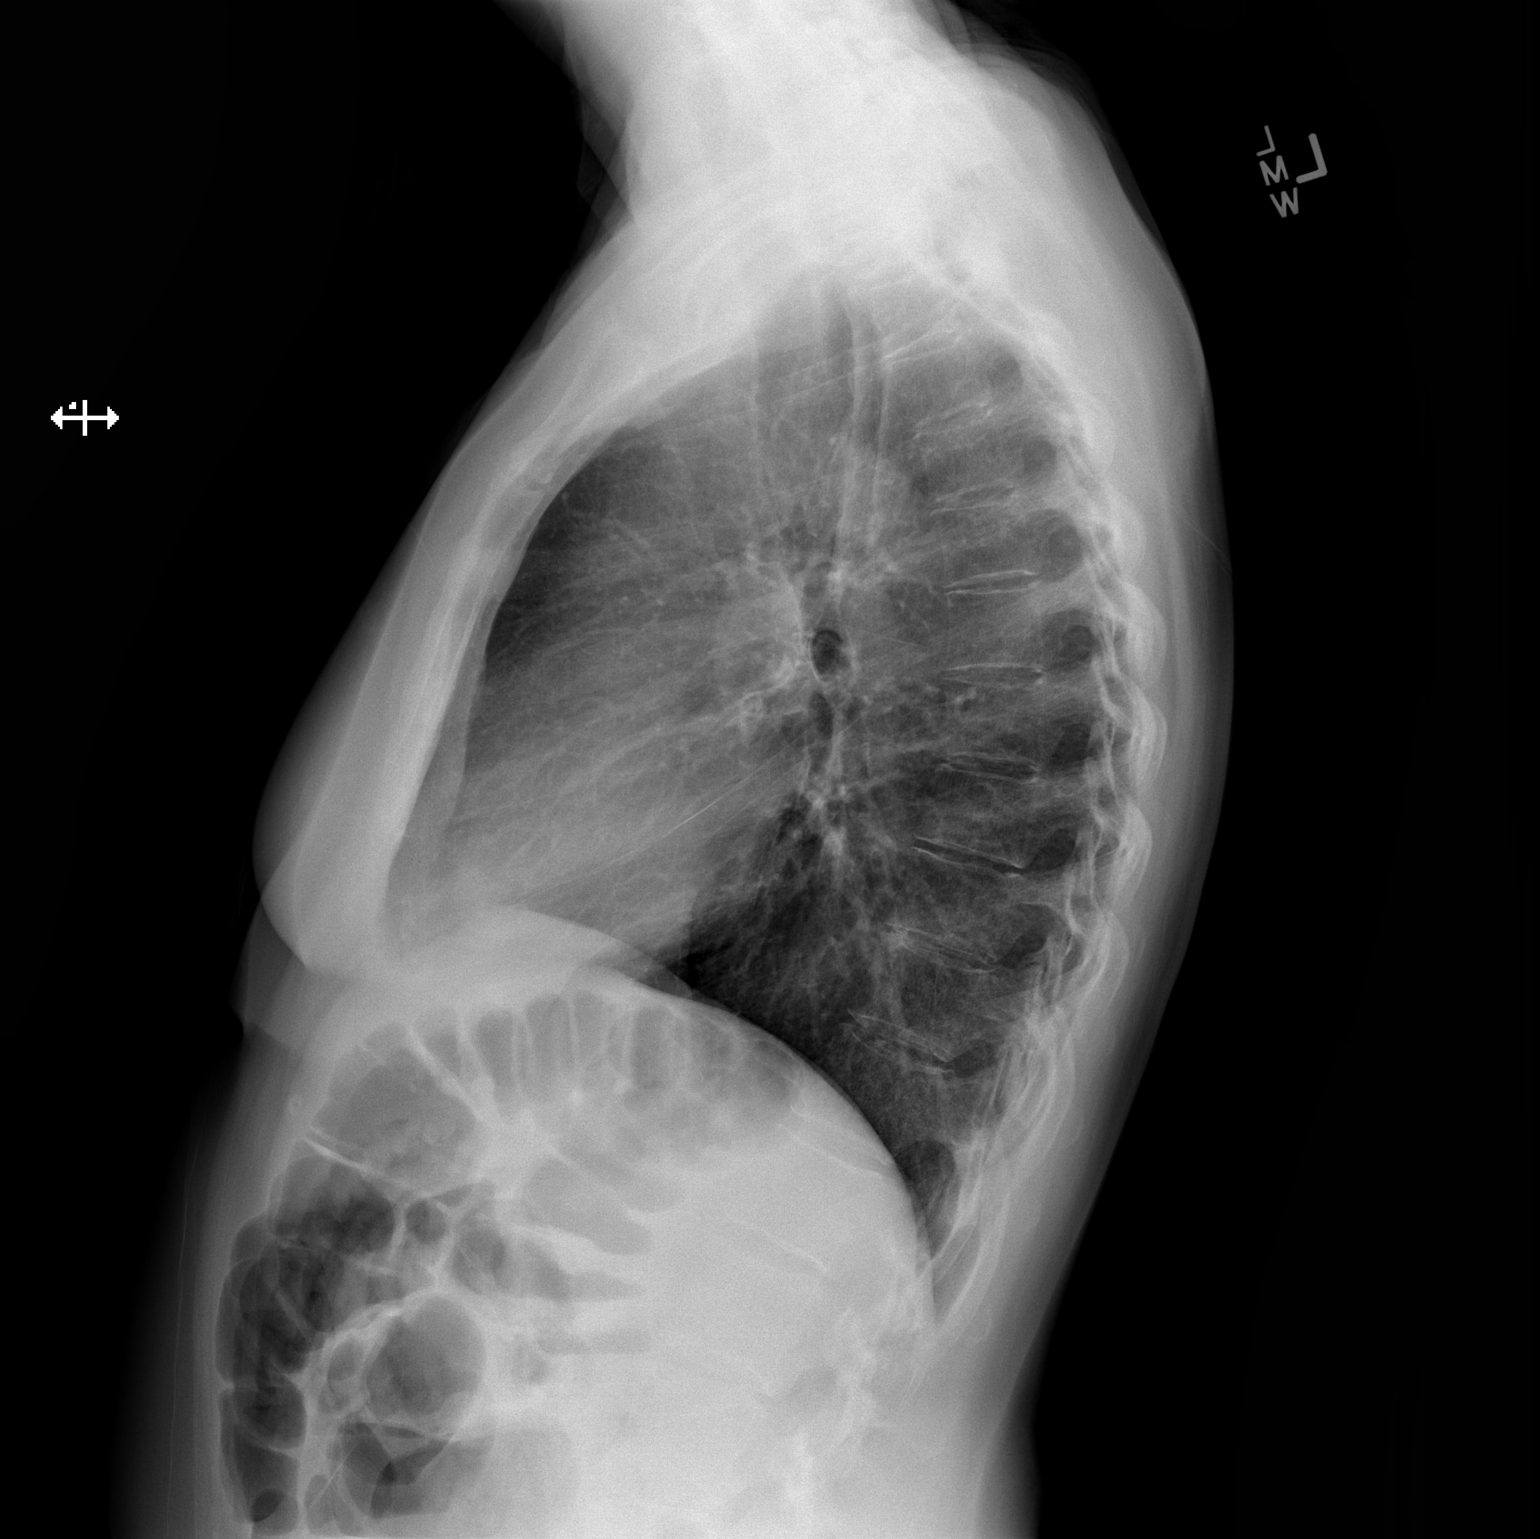

[2 of 2 positions shown; findings below may reference images not displayed]

CLINICAL DATA
Cough and fever

EXAM
CHEST  2 VIEW

COMPARISON
January 16, 2013

FINDINGS
The heart size and mediastinal contours are within normal limits.
Both lungs are clear. The visualized skeletal structures are stable.
There is scoliosis of spine.

IMPRESSION
No active cardiopulmonary disease.

SIGNATURE

## 2014-09-05 ENCOUNTER — Other Ambulatory Visit: Payer: Self-pay | Admitting: Family Medicine

## 2014-10-04 ENCOUNTER — Ambulatory Visit (INDEPENDENT_AMBULATORY_CARE_PROVIDER_SITE_OTHER): Payer: PRIVATE HEALTH INSURANCE | Admitting: Psychology

## 2014-10-04 DIAGNOSIS — F339 Major depressive disorder, recurrent, unspecified: Secondary | ICD-10-CM

## 2014-10-10 ENCOUNTER — Other Ambulatory Visit: Payer: Self-pay | Admitting: Family Medicine

## 2014-10-26 ENCOUNTER — Emergency Department: Payer: Self-pay | Admitting: Emergency Medicine

## 2014-11-03 ENCOUNTER — Other Ambulatory Visit: Payer: Self-pay | Admitting: Family Medicine

## 2014-11-12 ENCOUNTER — Other Ambulatory Visit: Payer: Self-pay | Admitting: Family Medicine

## 2014-11-13 ENCOUNTER — Encounter: Payer: Self-pay | Admitting: Internal Medicine

## 2014-11-22 ENCOUNTER — Ambulatory Visit (INDEPENDENT_AMBULATORY_CARE_PROVIDER_SITE_OTHER): Payer: PRIVATE HEALTH INSURANCE | Admitting: Psychology

## 2014-11-22 DIAGNOSIS — F339 Major depressive disorder, recurrent, unspecified: Secondary | ICD-10-CM

## 2014-12-06 ENCOUNTER — Encounter: Payer: Self-pay | Admitting: Internal Medicine

## 2015-02-19 ENCOUNTER — Telehealth: Payer: Self-pay | Admitting: *Deleted

## 2015-02-19 MED ORDER — MERCAPTOPURINE 50 MG PO TABS: 50.0000 mg | ORAL_TABLET | Freq: Every day | ORAL | Status: DC

## 2015-02-19 NOTE — Telephone Encounter (Signed)
Sent Rx for mercaptopurine, 50 mg, #30 with no refills to [facility]. Pt needs to make an appointment with Dr. Olevia Perches for future refills.

## 2015-02-24 HISTORY — PX: COLONOSCOPY: SHX174

## 2015-03-01 ENCOUNTER — Ambulatory Visit (AMBULATORY_SURGERY_CENTER): Payer: Self-pay | Admitting: *Deleted

## 2015-03-01 ENCOUNTER — Encounter: Payer: Self-pay | Admitting: Internal Medicine

## 2015-03-01 VITALS — Ht 60.0 in | Wt 108.0 lb

## 2015-03-01 DIAGNOSIS — K5010 Crohn's disease of large intestine without complications: Secondary | ICD-10-CM

## 2015-03-01 NOTE — Progress Notes (Signed)
No egg or soy allergy No issues with sedation No intubation difficulties No diet pills No home 02  emmi declined

## 2015-03-15 ENCOUNTER — Encounter: Payer: Self-pay | Admitting: Internal Medicine

## 2015-03-15 ENCOUNTER — Encounter: Payer: Self-pay | Admitting: *Deleted

## 2015-03-15 ENCOUNTER — Ambulatory Visit (AMBULATORY_SURGERY_CENTER): Payer: PRIVATE HEALTH INSURANCE | Admitting: Internal Medicine

## 2015-03-15 ENCOUNTER — Other Ambulatory Visit: Payer: Self-pay | Admitting: Internal Medicine

## 2015-03-15 VITALS — BP 128/61 | HR 64 | Temp 97.2°F | Resp 19 | Ht 60.0 in | Wt 108.0 lb

## 2015-03-15 DIAGNOSIS — K635 Polyp of colon: Secondary | ICD-10-CM

## 2015-03-15 DIAGNOSIS — R197 Diarrhea, unspecified: Secondary | ICD-10-CM

## 2015-03-15 DIAGNOSIS — K5010 Crohn's disease of large intestine without complications: Secondary | ICD-10-CM

## 2015-03-15 DIAGNOSIS — D125 Benign neoplasm of sigmoid colon: Secondary | ICD-10-CM

## 2015-03-15 MED ORDER — SODIUM CHLORIDE 0.9 % IV SOLN: 500.0000 mL | INTRAVENOUS | Status: DC

## 2015-03-15 NOTE — Progress Notes (Signed)
Called to room to assist during endoscopic procedure.  Patient ID and intended procedure confirmed with present staff. Received instructions for my participation in the procedure from the performing physician.  

## 2015-03-15 NOTE — Progress Notes (Signed)
A/ox3 pleased with MAC, report to Karen RN 

## 2015-03-15 NOTE — Patient Instructions (Signed)
  Contact Dr Nichola Sizer office for a doctor visit.   YOU HAD AN ENDOSCOPIC PROCEDURE TODAY AT [facility]:   Refer to the procedure report that was given to you for any specific questions about what was found during the examination.  If the procedure report does not answer your questions, please call your gastroenterologist to clarify.  If you requested that your care partner not be given the details of your procedure findings, then the procedure report has been included in a sealed envelope for you to review at your convenience later.  YOU SHOULD EXPECT: Some feelings of bloating in the abdomen. Passage of more gas than usual.  Walking can help get rid of the air that was put into your GI tract during the procedure and reduce the bloating. If you had a lower endoscopy (such as a colonoscopy or flexible sigmoidoscopy) you may notice spotting of blood in your stool or on the toilet paper. If you underwent a bowel prep for your procedure, you may not have a normal bowel movement for a few days.  Please Note:  You might notice some irritation and congestion in your nose or some drainage.  This is from the oxygen used during your procedure.  There is no need for concern and it should clear up in a day or so.  SYMPTOMS TO REPORT IMMEDIATELY:   Following lower endoscopy (colonoscopy or flexible sigmoidoscopy):  Excessive amounts of blood in the stool  Significant tenderness or worsening of abdominal pains  Swelling of the abdomen that is new, acute  Fever of 100F or higher   For urgent or emergent issues, a gastroenterologist can be reached at any hour by calling [phone].   DIET: Your first meal following the procedure should be a small meal and then it is ok to progress to your normal diet. Heavy or fried foods are harder to digest and may make you feel nauseous or bloated.  Likewise, meals heavy in dairy and vegetables can increase bloating.  Drink plenty of fluids but you  should avoid alcoholic beverages for 24 hours.  ACTIVITY:  You should plan to take it easy for the rest of today and you should NOT DRIVE or use heavy machinery until tomorrow (because of the sedation medicines used during the test).    FOLLOW UP: Our staff will call the number listed on your records the next business day following your procedure to check on you and address any questions or concerns that you may have regarding the information given to you following your procedure. If we do not reach you, we will leave a message.  However, if you are feeling well and you are not experiencing any problems, there is no need to return our call.  We will assume that you have returned to your regular daily activities without incident.  If any biopsies were taken you will be contacted by phone or by letter within the next 1-3 weeks.  Please call us at [phone] if you have not heard about the biopsies in 3 weeks.    SIGNATURES/CONFIDENTIALITY: You and/or your care partner have signed paperwork which will be entered into your electronic medical record.  These signatures attest to the fact that that the information above on your After Visit Summary has been reviewed and is understood.  Full responsibility of the confidentiality of this discharge information lies with you and/or your care-partner.

## 2015-03-15 NOTE — Op Note (Addendum)
[facility]  [address] [place], [address]   COLONOSCOPY PROCEDURE REPORT  PATIENT: Michelle Vasquez, Michelle Vasquez  MR#: [id number] BIRTHDATE: 02-17-1951 , 24  yrs. old GENDER: female ENDOSCOPIST: Lafayette Dragon, MD REFERRED AO:[id number] Danise Mina, M.D. PROCEDURE DATE:  03/15/2015 PROCEDURE:   Colonoscopy, diagnostic and Colonoscopy with biopsy First Screening Colonoscopy - Avg.  risk and is 50 yrs.  old or older - No.  Prior Negative Screening - Now for repeat screening. N/A  History of Adenoma - Now for follow-up colonoscopy & has been > or = to 3 yrs.  N/A  Polyps removed today? Yes ASA CLASS:   Class II INDICATIONS:Unexplained iron deficiency anemia and Inflammatory bowel disease of the intestine if more precise diagnosis or determination of the extent / severity of activity of disease will influence immediate / future management. Crohn's disease since 41. Terminal ileal resection in 1981. Small bowel obstruction in 2008. 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 Prior colonoscopy in 2006 and in May 2011 showed 50% narrowing at the anastomosis. She has been on 6 MP MEDICATIONS: Monitored anesthesia care and Propofol 200 mg IV  DESCRIPTION OF PROCEDURE:   After the risks benefits and alternatives of the procedure were thoroughly explained, informed consent was obtained.  The digital rectal exam revealed no abnormalities of the rectum.   The LB [id number]  endoscope was introduced through the anus and advanced to the surgical anastomosis. No adverse events experienced.   The quality of the prep was good.  (MoviPrep was used)  The instrument was then slowly withdrawn as the colon was fully examined. Estimated blood loss is zero unless otherwise noted in this procedure report.      COLON FINDINGS: There was evidence of a moderately stenosed prior surgical anastomosis in the distal ileum.diameter of the stricture was less than 5 mm. After colonoscopy could not pass  through. Multiple biopsies were obtained from the stricture the distal ileum. There were no aphthous ulcerations or active Crohn's disease. Multiple random biopsies were obtained from right transverse and left colon  Multiple biopsies were performed using cold forceps.  Retroflexed views revealed no abnormalities. The time to cecum = 9.49 Withdrawal time = 8.22 rectum was scarred. there was a 3 mm diminutive polyp at the sigmoid colon at 20 cm which was removed with cold biopsiesThere were several skin tags and irregular mucosa indicating past rectal involvement with the Crohn's disease. There were no active fissure or draining fistula The scope was withdrawn and the procedure completed. COMPLICATIONS: There were no immediate complications.  ENDOSCOPIC IMPRESSION: 1. past terminal ileal resection resulting in ileocolic anastomosis. 2. Father stenosis of a ileocolic stricture now measuring about 5 mm in diameter. Multiple biopsies obtained 3. Random biopsies throughout the colon 4. Evidence of  rectal Crohn's disease without any current activity 5. diminutive polyp of the sigmoid colon removed  RECOMMENDATIONS: await biopsy results Follow-up OV to discuss options for treatment  which may include surgical resection of a stricture or consideration of using biologicals Continue to monitor B12. And iron levels  eSigned:  Lafayette Dragon, MD 03/15/2015 8:50 AM Revised: 03/15/2015 8:50 AM  cc:   PATIENT NAME:  Michelle Vasquez, Michelle Vasquez MR#: [id number]

## 2015-03-18 ENCOUNTER — Telehealth: Payer: Self-pay

## 2015-03-18 NOTE — Telephone Encounter (Signed)
  Follow up Call-  Call back number 03/15/2015  Post procedure Call Back phone  # [phone]  Permission to leave phone message Yes     Patient questions:  Do you have a fever, pain , or abdominal swelling? No. Pain Score  0 *  Have you tolerated food without any problems? Yes.    Have you been able to return to your normal activities? Yes.    Do you have any questions about your discharge instructions: Diet   No. Medications  No. Follow up visit  No.  Do you have questions or concerns about your Care? No.  Actions: * If pain score is 4 or above: No action needed, pain <4.

## 2015-03-22 ENCOUNTER — Telehealth: Payer: Self-pay | Admitting: *Deleted

## 2015-03-22 MED ORDER — MERCAPTOPURINE 50 MG PO TABS: 50.0000 mg | ORAL_TABLET | Freq: Every day | ORAL | Status: DC

## 2015-03-22 NOTE — Telephone Encounter (Signed)
Sent Rx for mercaptopurine, 50 mg, #30 with no refills to [facility]. Pt has appt with Dr. Olevia Perches on 04/05/15 at 2 pm. Pt needs to keep appt for future refills.

## 2015-03-23 ENCOUNTER — Encounter: Payer: Self-pay | Admitting: Internal Medicine

## 2015-03-27 ENCOUNTER — Encounter: Payer: Self-pay | Admitting: Family Medicine

## 2015-04-04 ENCOUNTER — Telehealth: Payer: Self-pay | Admitting: *Deleted

## 2015-04-04 NOTE — Telephone Encounter (Signed)
-----   Message from Hulan Saas, RN sent at 04/09/2014  9:24 AM EDT ----- Call and remind patient due for B12 for DB on 04/07/14. Lab in epic

## 2015-04-04 NOTE — Telephone Encounter (Signed)
Patient will come for labs.  

## 2015-04-05 ENCOUNTER — Ambulatory Visit (INDEPENDENT_AMBULATORY_CARE_PROVIDER_SITE_OTHER): Payer: PRIVATE HEALTH INSURANCE | Admitting: Internal Medicine

## 2015-04-05 ENCOUNTER — Encounter: Payer: Self-pay | Admitting: Internal Medicine

## 2015-04-05 ENCOUNTER — Other Ambulatory Visit (INDEPENDENT_AMBULATORY_CARE_PROVIDER_SITE_OTHER): Payer: PRIVATE HEALTH INSURANCE

## 2015-04-05 VITALS — BP 130/68 | HR 70 | Ht 60.0 in | Wt 109.4 lb

## 2015-04-05 DIAGNOSIS — K50119 Crohn's disease of large intestine with unspecified complications: Secondary | ICD-10-CM | POA: Diagnosis not present

## 2015-04-05 DIAGNOSIS — Z8719 Personal history of other diseases of the digestive system: Secondary | ICD-10-CM

## 2015-04-05 DIAGNOSIS — E538 Deficiency of other specified B group vitamins: Secondary | ICD-10-CM | POA: Diagnosis not present

## 2015-04-05 LAB — VITAMIN B12: Vitamin B-12: 220 pg/mL (ref 211–911)

## 2015-04-05 MED ORDER — METRONIDAZOLE 250 MG PO TABS: 250.0000 mg | ORAL_TABLET | Freq: Three times a day (TID) | ORAL | Status: DC

## 2015-04-05 MED ORDER — BUDESONIDE 3 MG PO CPEP: ORAL_CAPSULE | ORAL | Status: DC

## 2015-04-05 NOTE — Progress Notes (Signed)
Michelle Vasquez [birth year] [id number]  Note: This dictation was prepared with Dragon digital system. Any transcriptional errors that result from this procedure are unintentional.   History of Present Illness: This is a 64 year old white female with Crohn's disease of the distal ileum diagnosed in 74. Patient had a remote terminal ileal resection and has developed an anastomotic stricture. She presented with a small bowel obstruction in 2008 which resolved spontaneously. She has been on 6-MP 50 mg daily for several years. She has 50% all narrowing of the lumen on colonoscopy in 2011 and again in May 2016. Biopsies of the stricture revealed no active Crohn's disease. She is complaining of gas bloating and diarrhea having 5-6 loose stools a day occasionally at night. She responded to Flagyl in the past. Her weight has been stable. She denies nausea or vomiting. Her sister who has Crohn's disease has been  onEntyvio but the patient is afraid to consider that at this time..    Past Medical History  Diagnosis Date  . HTN (hypertension)   . Crohn disease     s/p colectomy on 6MP, ?bacterial overgrowth  . History of small bowel obstruction   . Osteomalacia, unspecified   . Unspecified deficiency anemia   . B12 deficiency   . Internal hemorrhoids without mention of complication   . Unspecified polyarthropathy or polyarthritis, multiple sites   . Central hearing loss   . Perennial allergic rhinitis with seasonal variation   . Fatty liver 12/15/11  . Anxiety and depression 07/03/2011  . Allergy     seasonal  . Blood transfusion without reported diagnosis     Past Surgical History  Procedure Laterality Date  . Total abdominal hysterectomy  1975    ovaries out as well  . Appendectomy    . Other surgical history  1960s, 1978, 1980    ileal resection x 3 and subtotal colon resection  . Esophagogastroduodenoscopy  02/25/05    Dilated stricture  . Colonoscopy  02/25/05    Internal hemms; crohns;  narrow anast  . Colonoscopy  02/26/10    Prior right hemicolectomy o/w benign (Dr. Olevia Perches)  . Hemicolectomy  1980    all small intestine, none large  . Dexa  07/06/2011    normal T score -1.0 femur, spine  . Upper gastrointestinal endoscopy    . Colonoscopy  02/2015    benign biopsies, crohn's (Shun Pletz)    Allergies  Allergen Reactions  . Celexa [Citalopram Hydrobromide] Other (See Comments)    Racing thoughts, couldn't sleep    Family history and social history have been reviewed.  Review of Systems: Negative for weight loss, rectal bleeding nausea or vomiting  The remainder of the 10 point ROS is negative except as outlined in the H&P  Physical Exam: General Appearance Well developed, in no distress Eyes  Non icteric  HEENT  Non traumatic, normocephalic  Mouth No lesion, tongue papillated, no cheilosis Neck Supple without adenopathy, thyroid not enlarged, no carotid bruits, no JVD Lungs Clear to auscultation bilaterally COR Normal S1, normal S2, regular rhythm, no murmur, quiet precordium Abdomen Mildly protuberant and tympanitic with hyperactive bowel sounds. Diffuse tenderness. More so in the right lower quadrant. Ever edge at costal margin. No fluid wave  Rectal Not repeated  Extremities  No pedal edema Skin No lesions Neurological Alert and oriented x 3 Psychological Normal mood and affect  Assessment and Plan:   65 year old white female with Crohn's disease of the ileocolic anastomosis. Status post remote TI section in  1981. She is having recurrent diarrhea, bloating and nausea. Her symptoms have gradually progressed. We have  discussed biologicals. She would like to wait longer before starting biologicals. She is afraid of them. We will proceed with small bowel follow-through to assess any dilatation of the small bowel proximal to the anastomosis. She will start Flagyl 250 mg 3 times a day which helped in the past and Entecort 9 mg daily for 4 weeks followed by taper to 6  mg for 4 weeks and subsequently 3 mg for 4 weeks.    Michelle Vasquez 04/05/2015

## 2015-04-05 NOTE — Patient Instructions (Signed)
We have sent  medications to your pharmacy for you to pick up at your convenience. We have scheduled a Small Bowel Follow thru for you at [facility] for 04/11/15 at 1030 am, please arrive at 1015 am and have nothing to eat or drink after midnight.  Call [phone] with any questions.

## 2015-04-08 ENCOUNTER — Other Ambulatory Visit: Payer: Self-pay | Admitting: *Deleted

## 2015-04-08 DIAGNOSIS — E538 Deficiency of other specified B group vitamins: Secondary | ICD-10-CM

## 2015-04-08 MED ORDER — CYANOCOBALAMIN 1000 MCG/ML IJ SOLN: INTRAMUSCULAR | Status: DC

## 2015-04-11 ENCOUNTER — Ambulatory Visit (HOSPITAL_COMMUNITY)
Admission: RE | Admit: 2015-04-11 | Discharge: 2015-04-11 | Disposition: A | Discharge status: Home / Self Care | Payer: PRIVATE HEALTH INSURANCE | Source: Ambulatory Visit | Attending: Internal Medicine | Admitting: Internal Medicine

## 2015-04-11 DIAGNOSIS — K5669 Other intestinal obstruction: Secondary | ICD-10-CM | POA: Insufficient documentation

## 2015-04-11 DIAGNOSIS — K5090 Crohn's disease, unspecified, without complications: Secondary | ICD-10-CM | POA: Insufficient documentation

## 2015-04-11 DIAGNOSIS — K50119 Crohn's disease of large intestine with unspecified complications: Secondary | ICD-10-CM

## 2015-04-22 ENCOUNTER — Other Ambulatory Visit: Payer: Self-pay

## 2015-04-22 MED ORDER — MERCAPTOPURINE 50 MG PO TABS: 50.0000 mg | ORAL_TABLET | Freq: Every day | ORAL | Status: DC

## 2015-05-18 ENCOUNTER — Other Ambulatory Visit: Payer: Self-pay | Admitting: Family Medicine

## 2015-05-28 ENCOUNTER — Encounter: Payer: Self-pay | Admitting: Internal Medicine

## 2015-05-28 ENCOUNTER — Other Ambulatory Visit (INDEPENDENT_AMBULATORY_CARE_PROVIDER_SITE_OTHER): Payer: PRIVATE HEALTH INSURANCE

## 2015-05-28 ENCOUNTER — Ambulatory Visit (INDEPENDENT_AMBULATORY_CARE_PROVIDER_SITE_OTHER): Payer: PRIVATE HEALTH INSURANCE | Admitting: Internal Medicine

## 2015-05-28 VITALS — BP 134/60 | HR 56 | Ht 60.0 in | Wt 112.0 lb

## 2015-05-28 DIAGNOSIS — K50119 Crohn's disease of large intestine with unspecified complications: Secondary | ICD-10-CM

## 2015-05-28 DIAGNOSIS — D509 Iron deficiency anemia, unspecified: Secondary | ICD-10-CM

## 2015-05-28 LAB — IBC PANEL
Iron: 88 ug/dL (ref 42–145)
Saturation Ratios: 22.5 % (ref 20.0–50.0)
Transferrin: 279 mg/dL (ref 212.0–360.0)

## 2015-05-28 LAB — CBC WITH DIFFERENTIAL/PLATELET
BASOS ABS: 0 10*3/uL (ref 0.0–0.1)
BASOS PCT: 0.3 % (ref 0.0–3.0)
EOS PCT: 0.9 % (ref 0.0–5.0)
Eosinophils Absolute: 0.1 10*3/uL (ref 0.0–0.7)
HEMATOCRIT: 39.7 % (ref 36.0–46.0)
HEMOGLOBIN: 13.3 g/dL (ref 12.0–15.0)
LYMPHS ABS: 2.4 10*3/uL (ref 0.7–4.0)
LYMPHS PCT: 32.7 % (ref 12.0–46.0)
MCHC: 33.4 g/dL (ref 30.0–36.0)
MCV: 100.2 fl — ABNORMAL HIGH (ref 78.0–100.0)
MONO ABS: 0.6 10*3/uL (ref 0.1–1.0)
Monocytes Relative: 8.4 % (ref 3.0–12.0)
NEUTROS ABS: 4.3 10*3/uL (ref 1.4–7.7)
NEUTROS PCT: 57.7 % (ref 43.0–77.0)
Platelets: 283 10*3/uL (ref 150.0–400.0)
RBC: 3.96 Mil/uL (ref 3.87–5.11)
RDW: 14.6 % (ref 11.5–15.5)
WBC: 7.5 10*3/uL (ref 4.0–10.5)

## 2015-05-28 NOTE — Patient Instructions (Addendum)
Your physician has requested that you go to the basement for  lab work before leaving today. Dr Danise Mina

## 2015-05-28 NOTE — Progress Notes (Signed)
JAKEYA GHERARDI 1951/01/16 [id number]  Note: This dictation was prepared with Dragon digital system. Any transcriptional errors that result from this procedure are unintentional.   History of Present Illness: This is a  64 year old on white female with Crohn's disease of the terminal ileum. TI resection in 1981. Chronic diarrhea. Last office visit June 2016. She was found to be B12 deficient and started on B12 supplements. Colonoscopy in May 9381 showed  ileocolic stricture 5 mm in diameter. Small bowel follow-through ,however, showed no evidence of obstruction. Barium passed through the ileocolic anastomosis without delay. There was a mild stricture but clinically not significant. She has been treated with Flagyl 250  Mg 3 times a day and Entocort taper 9 mg for 4 weeks, 6 mg for 4 weeks , she is currently on 3 mg daily. She has not noticed any improvement in the diarrhea.    Past Medical History  Diagnosis Date  . HTN (hypertension)   . Crohn disease     s/p colectomy on 6MP, ?bacterial overgrowth  . History of small bowel obstruction   . Osteomalacia, unspecified   . Unspecified deficiency anemia   . B12 deficiency   . Internal hemorrhoids without mention of complication   . Unspecified polyarthropathy or polyarthritis, multiple sites   . Central hearing loss   . Perennial allergic rhinitis with seasonal variation   . Fatty liver 12/15/11  . Anxiety and depression 07/03/2011  . Allergy     seasonal  . Blood transfusion without reported diagnosis     Past Surgical History  Procedure Laterality Date  . Total abdominal hysterectomy  1975    ovaries out as well  . Appendectomy    . Other surgical history  1960s, 1978, 1980    ileal resection x 3 and subtotal colon resection  . Esophagogastroduodenoscopy  02/25/05    Dilated stricture  . Colonoscopy  02/25/05    Internal hemms; crohns; narrow anast  . Colonoscopy  02/26/10    Prior right hemicolectomy o/w benign (Dr. Olevia Perches)  .  Hemicolectomy  1980    all small intestine, none large  . Dexa  07/06/2011    normal T score -1.0 femur, spine  . Upper gastrointestinal endoscopy    . Colonoscopy  02/2015    benign biopsies, crohn's (Rena Sweeden)    Allergies  Allergen Reactions  . Celexa [Citalopram Hydrobromide] Other (See Comments)    Racing thoughts, couldn't sleep    Family history and social history have been reviewed.  Review of Systems:  Diarrhea. Weight stable  The remainder of the 10 point ROS is negative except as outlined in the H&P  Physical Exam: General Appearance Well developed, in no distress Eyes  Non icteric  HEENT  Non traumatic, normocephalic  Mouth No lesion, tongue papillated, no cheilosis Neck Supple without adenopathy, thyroid not enlarged, no carotid bruits, no JVD Lungs Clear to auscultation bilaterally COR Normal S1, normal S2, regular rhythm, no murmur, quiet precordium Abdomen  Soft tender in right lower quadrant. Normoactive bowel sounds. No tympany or distention Rectal  Not repeated Extremities  No pedal edema Skin No lesions Neurological Alert and oriented x 3 Psychological Normal mood and affect  Assessment and Plan:    64 year old white female with Crohn's disease of the terminal ileum since 1981. Status post a resection. She has had a stable stricture at the ileocolic anastomosis causing chronic diarrhea but clinically there is  no obstruction. Recent small bowel follow-through shows free flow of the barium  through the stricture. She has been on low-residue diet. The stricture is fibrotic and does not show any evidence of active colitis. For that reason I'm reluctant to put her on biologicals. She is currently on 6-MP 50 mg daily and will continue the same regimen. She will complete the budesonide 3 mg daily  She will follow up with Dr.Nandigam. Today check CBC and iron levels he check a B12 level in 6 months    [name] 05/28/2015

## 2015-05-29 ENCOUNTER — Other Ambulatory Visit: Payer: Self-pay

## 2015-05-29 LAB — FOLATE: Folate: 23.9 ng/mL (ref >5.9)

## 2015-05-29 LAB — FERRITIN: Ferritin: 357.1 ng/mL — ABNORMAL HIGH (ref 10.0–291.0)

## 2015-06-13 IMAGING — CR DG FOREARM 2V*L*
1 series · 2 of 2 positions shown · non-contrast
Comparison: None.

CLINICAL DATA: Animal bite 7 days ago.  Initial encounter.

EXAM:
LEFT FOREARM - 2 VIEW

[Series 1: ap · 0.17mm/px · 2 of 2 slices shown]
[im 1/2]
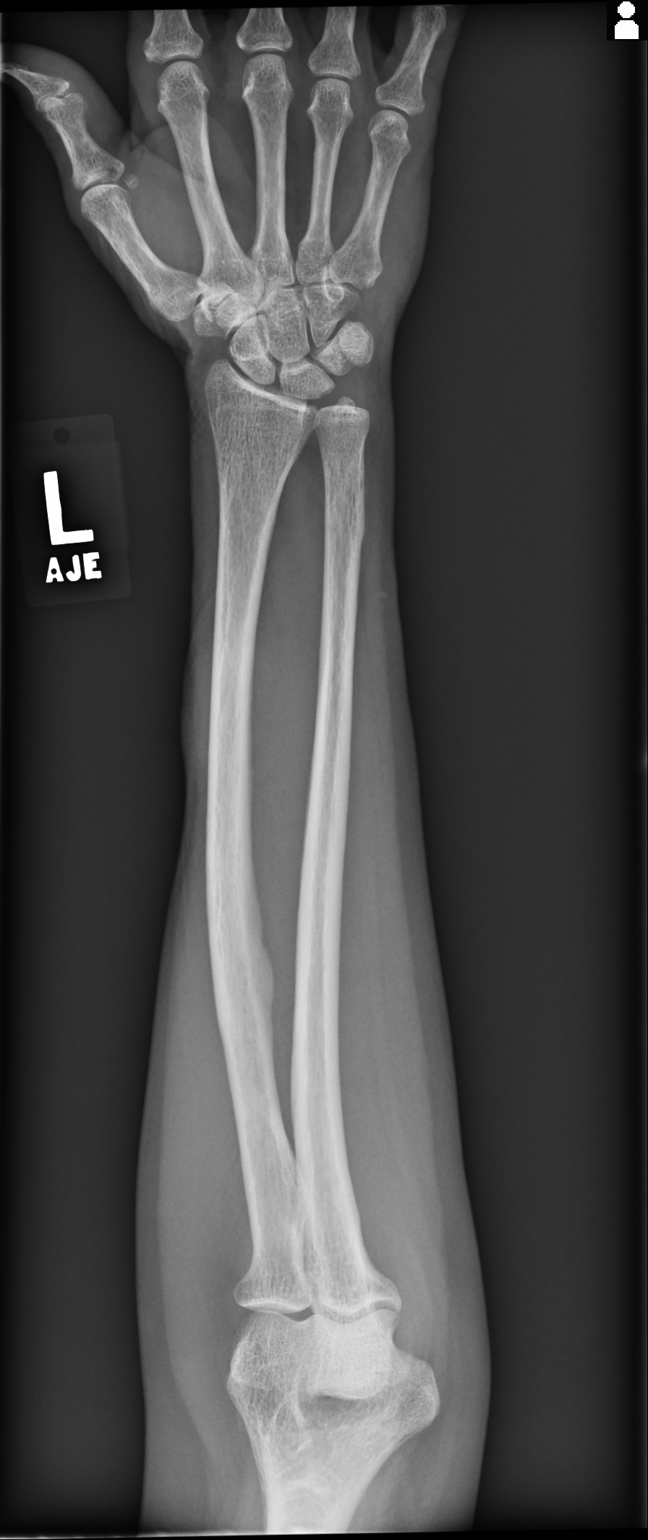
[im 2/2]
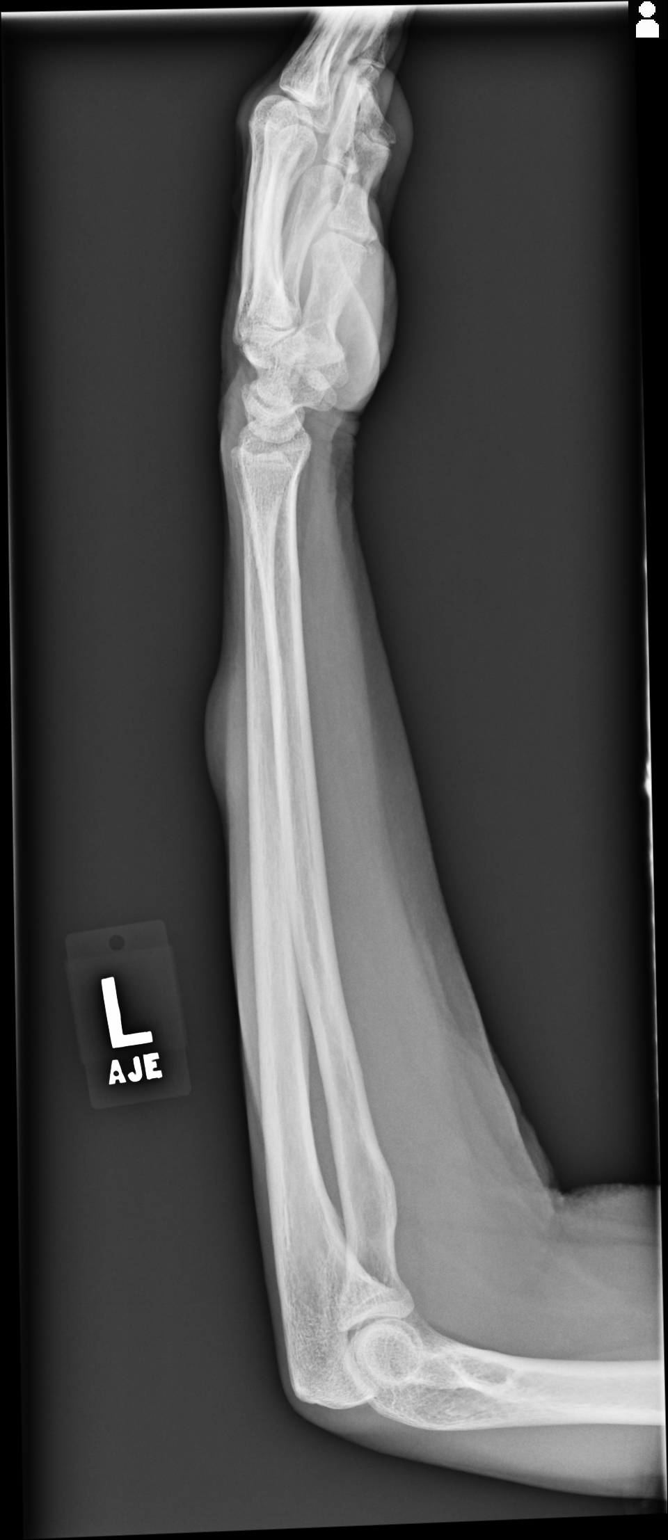

[2 of 2 positions shown; findings below may reference images not displayed]

FINDINGS: No radiopaque foreign body or osteolysis to suggest osseous
infection. Soft tissue swelling is present along the radial aspect
of the mid to distal forearm. This is over the dorsum of the forearm
on the lateral view. No gas in the soft tissues. The radius and ulna
intact.
IMPRESSION: Radial and dorsal distal forearm soft tissue swelling.

## 2015-06-23 ENCOUNTER — Other Ambulatory Visit: Payer: Self-pay | Admitting: Family Medicine

## 2015-06-24 ENCOUNTER — Other Ambulatory Visit: Payer: Self-pay | Admitting: Family Medicine

## 2015-06-25 ENCOUNTER — Encounter: Payer: Self-pay | Admitting: Family Medicine

## 2015-06-25 ENCOUNTER — Ambulatory Visit (INDEPENDENT_AMBULATORY_CARE_PROVIDER_SITE_OTHER): Payer: PRIVATE HEALTH INSURANCE | Admitting: Family Medicine

## 2015-06-25 VITALS — BP 112/60 | HR 96 | Temp 98.3°F | Wt 111.5 lb

## 2015-06-25 DIAGNOSIS — B079 Viral wart, unspecified: Secondary | ICD-10-CM

## 2015-06-25 DIAGNOSIS — L989 Disorder of the skin and subcutaneous tissue, unspecified: Secondary | ICD-10-CM | POA: Insufficient documentation

## 2015-06-25 DIAGNOSIS — B078 Other viral warts: Secondary | ICD-10-CM | POA: Insufficient documentation

## 2015-06-25 NOTE — Assessment & Plan Note (Addendum)
Common wart vs possible keratoacanthoma. Treated with LN2 therapy. Discussed anticipated treatment reaction. Update if not improving with treatment, consider re-treatment in 2 wks.

## 2015-06-25 NOTE — Progress Notes (Signed)
BP 112/60 mmHg  Pulse 96  Temp(Src) 98.3 F (36.8 C) (Oral)  Wt 111 lb 8 oz (50.576 kg)   CC: check skin  Subjective:    Patient ID: Michelle Vasquez, female    DOB: 23-Apr-1951, 64 y.o.   MRN: [id number]  HPI: Michelle Vasquez is a 64 y.o. female presenting on 06/25/2015 for Skin Problem   Wart on right forearm present for months. Irritating, catches on things, stays tender. Requests treatment.  One spot on left cheek, irritated.  Several SKs throughout skin. Not tender or itchy.   Relevant past medical, surgical, family and social history reviewed and updated as indicated. Interim medical history since our last visit reviewed. Allergies and medications reviewed and updated. Current Outpatient Prescriptions on File Prior to Visit  Medication Sig  . amLODipine (NORVASC) 5 MG tablet Take 1 tablet (5 mg total) by mouth daily. NEED TO SCHEDULE ANNUAL PHYSICAL FORM ORE REFILLS [phone]  . cetirizine (ZYRTEC) 10 MG tablet Take 10 mg by mouth at bedtime.  . cyanocobalamin (,VITAMIN B-12,) 1000 MCG/ML injection Inject 1000 mcg/ml IM monthly x 6 months. Please, dispense 3 cc syringes and 25 G x 1 inch needles also.  . mercaptopurine (PURINETHOL) 50 MG tablet Take 1 tablet (50 mg total) by mouth daily. Give on an empty stomach 1 hour before or 2 hours after meals. Caution: Chemotherapy.  . naproxen (NAPROSYN) 375 MG tablet Take 1 tablet (375 mg total) by mouth 2 (two) times daily with a meal.  . omeprazole (PRILOSEC) 40 MG capsule TAKE ONE CAPSULE BY MOUTH DAILY  . sertraline (ZOLOFT) 100 MG tablet TAKE 1 TABLET BY MOUTH DAILY   No current facility-administered medications on file prior to visit.    Review of Systems Per HPI unless specifically indicated above     Objective:    BP 112/60 mmHg  Pulse 96  Temp(Src) 98.3 F (36.8 C) (Oral)  Wt 111 lb 8 oz (50.576 kg)  Wt Readings from Last 3 Encounters:  06/25/15 111 lb 8 oz (50.576 kg)  05/28/15 112 lb (50.803 kg)  04/05/15  109 lb 6 oz (49.612 kg)    Physical Exam  Constitutional: She appears well-developed and well-nourished. No distress.  Skin:  Verrucous growth right forearm Several benign lesions throughout skin with stuck on appearance Left cheek with irritated papule present  Nursing note and vitals reviewed.  Results for orders placed or performed in visit on 05/28/15  CBC with Differential/Platelet  Result Value Ref Range   WBC 7.5 4.0 - 10.5 K/uL   RBC 3.96 3.87 - 5.11 Mil/uL   Hemoglobin 13.3 12.0 - 15.0 g/dL   HCT 39.7 36.0 - 46.0 %   MCV 100.2 (H) 78.0 - 100.0 fl   MCHC 33.4 30.0 - 36.0 g/dL   RDW 14.6 11.5 - 15.5 %   Platelets 283.0 150.0 - 400.0 K/uL   Neutrophils Relative % 57.7 43.0 - 77.0 %   Lymphocytes Relative 32.7 12.0 - 46.0 %   Monocytes Relative 8.4 3.0 - 12.0 %   Eosinophils Relative 0.9 0.0 - 5.0 %   Basophils Relative 0.3 0.0 - 3.0 %   Neutro Abs 4.3 1.4 - 7.7 K/uL   Lymphs Abs 2.4 0.7 - 4.0 K/uL   Monocytes Absolute 0.6 0.1 - 1.0 K/uL   Eosinophils Absolute 0.1 0.0 - 0.7 K/uL   Basophils Absolute 0.0 0.0 - 0.1 K/uL  Ferritin  Result Value Ref Range   Ferritin 357.1 (H) 10.0 - 291.0  ng/mL  Folate  Result Value Ref Range   Folate 23.9 >5.9 ng/mL  IBC panel  Result Value Ref Range   Iron 88 42 - 145 ug/dL   Transferrin 279.0 212.0 - 360.0 mg/dL   Saturation Ratios 22.5 20.0 - 50.0 %      Assessment & Plan:   Problem List Items Addressed This Visit    Verruca vulgaris - Primary    Common wart vs possible keratoacanthoma. Treated with LN2 therapy. Discussed anticipated treatment reaction. Update if not improving with treatment, consider re-treatment in 2 wks.      Skin lesions    Reassured regarding benign SKs throughout skin. Monitor L cheek lesion. rec avoid picking, rec regular moisturizer.          Follow up plan: Return if symptoms worsen or fail to improve.

## 2015-06-25 NOTE — Assessment & Plan Note (Signed)
Reassured regarding benign SKs throughout skin. Monitor L cheek lesion. rec avoid picking, rec regular moisturizer.

## 2015-06-25 NOTE — Progress Notes (Signed)
Pre visit review using our clinic review tool, if applicable. No additional management support is needed unless otherwise documented below in the visit note. 

## 2015-06-25 NOTE — Patient Instructions (Signed)
Liquid nitrogen therapy for right arm wart. Other spots are benign seborrheic keratoses. We can just watch these spots. Treat cheek lesion with regular moisturizing. Let us know if not improving with treatment.  Seborrheic Keratosis Seborrheic keratosis is a common, noncancerous (benign) skin growth that can occur anywhere on the skin.It looks like "stuck-on," waxy, rough, tan, brown, or black spots on the skin. These skin growths can be flat or raised.They are often called "barnacles" because of their pasted-on appearance.Usually, these skin growths appear in adulthood, around age 25, and increase in number as you age. They may also develop during pregnancy or following estrogen therapy. Many people may only have one growth appear in their lifetime, while some people may develop many growths. CAUSES It is unknown what causes these skin growths, but they appear to run in families. SYMPTOMS Seborrheic keratosis is often located on the face, chest, shoulders, back, or other areas. These growths are:  Usually painless, but may become irritated and itchy.  Yellow, brown, black, or other colors.  Slightly raised or have a flat surface.  Sometimes rough or wart-like in texture.  Often waxy on the surface.  Round or oval-shaped.  Sometimes "stuck-on" in appearance.  Sometimes single, but there are usually many growths. Any growth that bleeds, itches on a regular basis, becomes inflamed, or becomes irritated needs to be evaluated by a skin specialist (dermatologist). DIAGNOSIS Diagnosis is mainly based on the way the growths appear. In some cases, it can be difficult to tell this type of skin growth from skin cancer. A skin growth tissue sample (biopsy) may be used to confirm the diagnosis. TREATMENT Most often, treatment is not needed because the skin growths are benign.If the skin growth is irritated easily by clothing or jewelry, causing it to scab or bleed, treatment may be  recommended. Patients may also choose to have the growths removed because they do not like their appearance. Most commonly, these growths are treated with cryosurgery. In cryosurgery, liquid nitrogen is applied to "freeze" the growth. The growth usually falls off within a matter of days. A blister may form and dry into a scab that will also fall off. After the growth or scab falls off, it may leave a dark or light spot on the skin. This color may fade over time, or it may remain permanent on the skin. HOME CARE INSTRUCTIONS If the skin growths are treated with cryosurgery, the treated area needs to be kept clean with water and soap. SEEK MEDICAL CARE IF:  You have questions about these growths or other skin problems.  You develop new symptoms, including:  A change in the appearance of the skin growth.  New growths.  Any bleeding, itching, or pain in the growths.  A skin growth that looks similar to seborrheic keratosis. Document Released: 11/14/2010 Document Revised: 01/04/2012 Document Reviewed: 11/14/2010 [facility] Patient Information 2015 [place], [place]. This information is not intended to replace advice given to you by your health care provider. Make sure you discuss any questions you have with your health care provider.

## 2015-06-27 ENCOUNTER — Encounter (HOSPITAL_BASED_OUTPATIENT_CLINIC_OR_DEPARTMENT_OTHER): Payer: PRIVATE HEALTH INSURANCE

## 2015-07-14 ENCOUNTER — Other Ambulatory Visit: Payer: Self-pay | Admitting: Family Medicine

## 2015-07-14 DIAGNOSIS — I10 Essential (primary) hypertension: Secondary | ICD-10-CM

## 2015-07-14 DIAGNOSIS — K5000 Crohn's disease of small intestine without complications: Secondary | ICD-10-CM

## 2015-07-14 DIAGNOSIS — D509 Iron deficiency anemia, unspecified: Secondary | ICD-10-CM

## 2015-07-14 DIAGNOSIS — E538 Deficiency of other specified B group vitamins: Secondary | ICD-10-CM

## 2015-07-16 ENCOUNTER — Other Ambulatory Visit: Payer: Self-pay

## 2015-07-16 ENCOUNTER — Ambulatory Visit
Admission: RE | Admit: 2015-07-16 | Discharge: 2015-07-16 | Disposition: A | Discharge status: Home / Self Care | Payer: PRIVATE HEALTH INSURANCE | Source: Ambulatory Visit

## 2015-07-16 ENCOUNTER — Encounter: Payer: Self-pay | Admitting: *Deleted

## 2015-07-16 DIAGNOSIS — Z1231 Encounter for screening mammogram for malignant neoplasm of breast: Secondary | ICD-10-CM

## 2015-07-16 LAB — HM MAMMOGRAPHY: HM MAMMO: NORMAL

## 2015-07-17 ENCOUNTER — Other Ambulatory Visit (INDEPENDENT_AMBULATORY_CARE_PROVIDER_SITE_OTHER): Payer: PRIVATE HEALTH INSURANCE

## 2015-07-17 DIAGNOSIS — E538 Deficiency of other specified B group vitamins: Secondary | ICD-10-CM | POA: Diagnosis not present

## 2015-07-17 DIAGNOSIS — I10 Essential (primary) hypertension: Secondary | ICD-10-CM

## 2015-07-17 LAB — LIPID PANEL
CHOL/HDL RATIO: 3
Cholesterol: 196 mg/dL (ref 0–200)
HDL: 77.8 mg/dL (ref >39.00)
LDL Cholesterol: 103 mg/dL — ABNORMAL HIGH (ref 0–99)
NonHDL: 118.09
TRIGLYCERIDES: 74 mg/dL (ref 0.0–149.0)
VLDL: 14.8 mg/dL (ref 0.0–40.0)

## 2015-07-17 LAB — COMPREHENSIVE METABOLIC PANEL
ALT: 26 U/L (ref 0–35)
AST: 26 U/L (ref 0–37)
Albumin: 4.2 g/dL (ref 3.5–5.2)
Alkaline Phosphatase: 63 U/L (ref 39–117)
BILIRUBIN TOTAL: 0.7 mg/dL (ref 0.2–1.2)
BUN: 17 mg/dL (ref 6–23)
CALCIUM: 9.6 mg/dL (ref 8.4–10.5)
CO2: 32 meq/L (ref 19–32)
CREATININE: 0.83 mg/dL (ref 0.40–1.20)
Chloride: 105 mEq/L (ref 96–112)
GFR: 73.35 mL/min (ref >60.00)
GLUCOSE: 78 mg/dL (ref 70–99)
Potassium: 4 mEq/L (ref 3.5–5.1)
Sodium: 142 mEq/L (ref 135–145)
Total Protein: 7.4 g/dL (ref 6.0–8.3)

## 2015-07-17 LAB — VITAMIN B12: Vitamin B-12: 435 pg/mL (ref 211–911)

## 2015-07-22 ENCOUNTER — Ambulatory Visit (INDEPENDENT_AMBULATORY_CARE_PROVIDER_SITE_OTHER): Payer: PRIVATE HEALTH INSURANCE | Admitting: Family Medicine

## 2015-07-22 ENCOUNTER — Encounter: Payer: Self-pay | Admitting: Family Medicine

## 2015-07-22 VITALS — BP 130/70 | HR 72 | Temp 98.3°F | Ht 60.0 in | Wt 111.2 lb

## 2015-07-22 DIAGNOSIS — B079 Viral wart, unspecified: Secondary | ICD-10-CM

## 2015-07-22 DIAGNOSIS — Z0000 Encounter for general adult medical examination without abnormal findings: Secondary | ICD-10-CM | POA: Diagnosis not present

## 2015-07-22 DIAGNOSIS — F419 Anxiety disorder, unspecified: Secondary | ICD-10-CM

## 2015-07-22 DIAGNOSIS — I10 Essential (primary) hypertension: Secondary | ICD-10-CM | POA: Diagnosis not present

## 2015-07-22 DIAGNOSIS — K5090 Crohn's disease, unspecified, without complications: Secondary | ICD-10-CM

## 2015-07-22 DIAGNOSIS — E538 Deficiency of other specified B group vitamins: Secondary | ICD-10-CM

## 2015-07-22 DIAGNOSIS — F418 Other specified anxiety disorders: Secondary | ICD-10-CM

## 2015-07-22 DIAGNOSIS — F32A Depression, unspecified: Secondary | ICD-10-CM

## 2015-07-22 DIAGNOSIS — F329 Major depressive disorder, single episode, unspecified: Secondary | ICD-10-CM

## 2015-07-22 DIAGNOSIS — B078 Other viral warts: Secondary | ICD-10-CM

## 2015-07-22 NOTE — Patient Instructions (Addendum)
Call your insurance about the shingles shot to see if it is covered or how much it would cost and where is cheaper (here or pharmacy).  If you want to receive here, call for nurse visit.  Bring me copy of latest DEXA. Liquid nitrogen to right forearm lesion, if not improved may try salicylic acid solution over the counter (wart -remover) daily may cover with duct tape if need stronger potency.  Nice to see you today, call us with questions. Return as needed or in 1 year for next physical.  Health Maintenance Adopting a healthy lifestyle and getting preventive care can go a long way to promote health and wellness. Talk with your health care provider about what schedule of regular examinations is right for you. This is a good chance for you to check in with your provider about disease prevention and staying healthy. In between checkups, there are plenty of things you can do on your own. Experts have done a lot of research about which lifestyle changes and preventive measures are most likely to keep you healthy. Ask your health care provider for more information. WEIGHT AND DIET  Eat a healthy diet  Be sure to include plenty of vegetables, fruits, low-fat dairy products, and lean protein.  Do not eat a lot of foods high in solid fats, added sugars, or salt.  Get regular exercise. This is one of the most important things you can do for your health.  Most adults should exercise for at least 150 minutes each week. The exercise should increase your heart rate and make you sweat (moderate-intensity exercise).  Most adults should also do strengthening exercises at least twice a week. This is in addition to the moderate-intensity exercise.  Maintain a healthy weight  Body mass index (BMI) is a measurement that can be used to identify possible weight problems. It estimates body fat based on height and weight. Your health care provider can help determine your BMI and help you achieve or maintain a  healthy weight.  For females 60 years of age and older:   A BMI below 18.5 is considered underweight.  A BMI of 18.5 to 24.9 is normal.  A BMI of 25 to 29.9 is considered overweight.  A BMI of 30 and above is considered obese.  Watch levels of cholesterol and blood lipids  You should start having your blood tested for lipids and cholesterol at 64 years of age, then have this test every 5 years.  You may need to have your cholesterol levels checked more often if:  Your lipid or cholesterol levels are high.  You are older than 65 years of age.  You are at high risk for heart disease.  CANCER SCREENING   Lung Cancer  Lung cancer screening is recommended for adults 56-70 years old who are at high risk for lung cancer because of a history of smoking.  A yearly low-dose CT scan of the lungs is recommended for people who:  Currently smoke.  Have quit within the past 15 years.  Have at least a 30-pack-year history of smoking. A pack year is smoking an average of one pack of cigarettes a day for 1 year.  Yearly screening should continue until it has been 15 years since you quit.  Yearly screening should stop if you develop a health problem that would prevent you from having lung cancer treatment.  Breast Cancer  Practice breast self-awareness. This means understanding how your breasts normally appear and feel.  It also  means doing regular breast self-exams. Let your health care provider know about any changes, no matter how small.  If you are in your 20s or 30s, you should have a clinical breast exam (CBE) by a health care provider every 1-3 years as part of a regular health exam.  If you are 63 or older, have a CBE every year. Also consider having a breast X-ray (mammogram) every year.  If you have a family history of breast cancer, talk to your health care provider about genetic screening.  If you are at high risk for breast cancer, talk to your health care provider  about having an MRI and a mammogram every year.  Breast cancer gene (BRCA) assessment is recommended for women who have family members with BRCA-related cancers. BRCA-related cancers include:  Breast.  Ovarian.  Tubal.  Peritoneal cancers.  Results of the assessment will determine the need for genetic counseling and BRCA1 and BRCA2 testing. Cervical Cancer Routine pelvic examinations to screen for cervical cancer are no longer recommended for nonpregnant women who are considered low risk for cancer of the pelvic organs (ovaries, uterus, and vagina) and who do not have symptoms. A pelvic examination may be necessary if you have symptoms including those associated with pelvic infections. Ask your health care provider if a screening pelvic exam is right for you.   The Pap test is the screening test for cervical cancer for women who are considered at risk.  If you had a hysterectomy for a problem that was not cancer or a condition that could lead to cancer, then you no longer need Pap tests.  If you are older than 65 years, and you have had normal Pap tests for the past 10 years, you no longer need to have Pap tests.  If you have had past treatment for cervical cancer or a condition that could lead to cancer, you need Pap tests and screening for cancer for at least 20 years after your treatment.  If you no longer get a Pap test, assess your risk factors if they change (such as having a new sexual partner). This can affect whether you should start being screened again.  Some women have medical problems that increase their chance of getting cervical cancer. If this is the case for you, your health care provider may recommend more frequent screening and Pap tests.  The human papillomavirus (HPV) test is another test that may be used for cervical cancer screening. The HPV test looks for the virus that can cause cell changes in the cervix. The cells collected during the Pap test can be tested for  HPV.  The HPV test can be used to screen women 86 years of age and older. Getting tested for HPV can extend the interval between normal Pap tests from three to five years.  An HPV test also should be used to screen women of any age who have unclear Pap test results.  After 65 years of age, women should have HPV testing as often as Pap tests.  Colorectal Cancer  This type of cancer can be detected and often prevented.  Routine colorectal cancer screening usually begins at 65 years of age and continues through 65 years of age.  Your health care provider may recommend screening at an earlier age if you have risk factors for colon cancer.  Your health care provider may also recommend using home test kits to check for hidden blood in the stool.  A small camera at the end of  a tube can be used to examine your colon directly (sigmoidoscopy or colonoscopy). This is done to check for the earliest forms of colorectal cancer.  Routine screening usually begins at age 78.  Direct examination of the colon should be repeated every 5-10 years through 65 years of age. However, you may need to be screened more often if early forms of precancerous polyps or small growths are found. Skin Cancer  Check your skin from head to toe regularly.  Tell your health care provider about any new moles or changes in moles, especially if there is a change in a mole's shape or color.  Also tell your health care provider if you have a mole that is larger than the size of a pencil eraser.  Always use sunscreen. Apply sunscreen liberally and repeatedly throughout the day.  Protect yourself by wearing long sleeves, pants, a wide-brimmed hat, and sunglasses whenever you are outside. HEART DISEASE, DIABETES, AND HIGH BLOOD PRESSURE   Have your blood pressure checked at least every 1-2 years. High blood pressure causes heart disease and increases the risk of stroke.  If you are between 73 years and 41 years old, ask  your health care provider if you should take aspirin to prevent strokes.  Have regular diabetes screenings. This involves taking a blood sample to check your fasting blood sugar level.  If you are at a normal weight and have a low risk for diabetes, have this test once every three years after 65 years of age.  If you are overweight and have a high risk for diabetes, consider being tested at a younger age or more often. PREVENTING INFECTION  Hepatitis B  If you have a higher risk for hepatitis B, you should be screened for this virus. You are considered at high risk for hepatitis B if:  You were born in a country where hepatitis B is common. Ask your health care provider which countries are considered high risk.  Your parents were born in a high-risk country, and you have not been immunized against hepatitis B (hepatitis B vaccine).  You have HIV or AIDS.  You use needles to inject street drugs.  You live with someone who has hepatitis B.  You have had sex with someone who has hepatitis B.  You get hemodialysis treatment.  You take certain medicines for conditions, including cancer, organ transplantation, and autoimmune conditions. Hepatitis C  Blood testing is recommended for:  Everyone born from [birth year] through 1965.  Anyone with known risk factors for hepatitis C. Sexually transmitted infections (STIs)  You should be screened for sexually transmitted infections (STIs) including gonorrhea and chlamydia if:  You are sexually active and are younger than 65 years of age.  You are older than 65 years of age and your health care provider tells you that you are at risk for this type of infection.  Your sexual activity has changed since you were last screened and you are at an increased risk for chlamydia or gonorrhea. Ask your health care provider if you are at risk.  If you do not have HIV, but are at risk, it may be recommended that you take a prescription medicine daily to  prevent HIV infection. This is called pre-exposure prophylaxis (PrEP). You are considered at risk if:  You are sexually active and do not regularly use condoms or know the HIV status of your partner(s).  You take drugs by injection.  You are sexually active with a partner who has HIV. Talk  with your health care provider about whether you are at high risk of being infected with HIV. If you choose to begin PrEP, you should first be tested for HIV. You should then be tested every 3 months for as long as you are taking PrEP.  PREGNANCY   If you are premenopausal and you may become pregnant, ask your health care provider about preconception counseling.  If you may become pregnant, take 400 to 800 micrograms (mcg) of folic acid every day.  If you want to prevent pregnancy, talk to your health care provider about birth control (contraception). OSTEOPOROSIS AND MENOPAUSE   Osteoporosis is a disease in which the bones lose minerals and strength with aging. This can result in serious bone fractures. Your risk for osteoporosis can be identified using a bone density scan.  If you are 67 years of age or older, or if you are at risk for osteoporosis and fractures, ask your health care provider if you should be screened.  Ask your health care provider whether you should take a calcium or vitamin D supplement to lower your risk for osteoporosis.  Menopause may have certain physical symptoms and risks.  Hormone replacement therapy may reduce some of these symptoms and risks. Talk to your health care provider about whether hormone replacement therapy is right for you.  HOME CARE INSTRUCTIONS   Schedule regular health, dental, and eye exams.  Stay current with your immunizations.   Do not use any tobacco products including cigarettes, chewing tobacco, or electronic cigarettes.  If you are pregnant, do not drink alcohol.  If you are breastfeeding, limit how much and how often you drink  alcohol.  Limit alcohol intake to no more than 1 drink per day for nonpregnant women. One drink equals 12 ounces of beer, 5 ounces of wine, or 1 ounces of hard liquor.  Do not use street drugs.  Do not share needles.  Ask your health care provider for help if you need support or information about quitting drugs.  Tell your health care provider if you often feel depressed.  Tell your health care provider if you have ever been abused or do not feel safe at home. Document Released: 04/27/2011 Document Revised: 02/26/2014 Document Reviewed: 09/13/2013 [facility] Patient Information 2015 [place], [place]. This information is not intended to replace advice given to you by your health care provider. Make sure you discuss any questions you have with your health care provider.

## 2015-07-22 NOTE — Progress Notes (Signed)
BP 130/70 mmHg  Pulse 72  Temp(Src) 98.3 F (36.8 C) (Oral)  Ht 5' (1.524 m)  Wt 111 lb 4 oz (50.463 kg)  BMI 21.73 kg/m2   CC: CPE  Subjective:    Patient ID: Michelle Vasquez, female    DOB: Jun 05, 1951, 64 y.o.   MRN: [id number]  HPI: Michelle Vasquez is a 64 y.o. female presenting on 07/22/2015 for Annual Exam   Skin lesion - thought wart, frozen off 06/10/2015 but never went away. Requests re treatment today.   Ongoing sinus issues for last 3 weeks - currently with L tinnitus, sharp L ear and sinus and eye pain.   Preventative: COLONOSCOPY Date: 02/2015 benign biopsies, crohn's with stenotic stricture at anastomosis Olevia Perches) Well woman - s/p total hysterectomy for fibroids, ovaries removed at age 38yo. Took hormone replacement for a few months, not after that.  DEXA 2012 - T-1.0. Again last month - will bring me records. Breast exam - Last mammo normal 06/2015  Tdap 2015 Flu shot yearly at work. Pneumovax 2014.  zostavax - discussed, will check with insurance. Ok by GI.  Completed Hep B series.  PPD at work yearly. Seat belt use discussed.  Sunscreen use discussed. No suspicious moles.  Caffeine: 2 cups coffee/day  Divorced 1989  3 children  Medical transcriptionist  Activity: walks some with dog (not recently), no regular activity  Diet: good water, fruits/vegetables occasionally   Relevant past medical, surgical, family and social history reviewed and updated as indicated. Interim medical history since our last visit reviewed. Allergies and medications reviewed and updated. Current Outpatient Prescriptions on File Prior to Visit  Medication Sig  . amLODipine (NORVASC) 5 MG tablet Take 1 tablet (5 mg total) by mouth daily. NEED TO SCHEDULE ANNUAL PHYSICAL FORM ORE REFILLS [phone]  . cetirizine (ZYRTEC) 10 MG tablet Take 10 mg by mouth at bedtime.  . cyanocobalamin (,VITAMIN B-12,) 1000 MCG/ML injection Inject 1000 mcg/ml IM monthly x 6 months. Please,  dispense 3 cc syringes and 25 G x 1 inch needles also.  . mercaptopurine (PURINETHOL) 50 MG tablet Take 1 tablet (50 mg total) by mouth daily. Give on an empty stomach 1 hour before or 2 hours after meals. Caution: Chemotherapy.  . naproxen (NAPROSYN) 375 MG tablet Take 1 tablet (375 mg total) by mouth 2 (two) times daily with a meal.  . omeprazole (PRILOSEC) 40 MG capsule TAKE ONE CAPSULE BY MOUTH DAILY  . sertraline (ZOLOFT) 100 MG tablet TAKE 1 TABLET BY MOUTH DAILY   No current facility-administered medications on file prior to visit.    Review of Systems  Constitutional: Negative for fever, chills, activity change, appetite change, fatigue and unexpected weight change.  HENT: Positive for tinnitus (L ear worse with allergies). Negative for hearing loss.   Eyes: Negative for visual disturbance.  Respiratory: Negative for cough, chest tightness, shortness of breath and wheezing.   Cardiovascular: Negative for chest pain, palpitations and leg swelling.  Gastrointestinal: Negative for nausea, vomiting, abdominal pain, diarrhea, constipation, blood in stool and abdominal distention.  Genitourinary: Negative for hematuria and difficulty urinating.  Musculoskeletal: Negative for myalgias, arthralgias and neck pain.  Skin: Negative for rash.  Neurological: Positive for headaches (L sinus pain). Negative for dizziness, seizures and syncope.  Hematological: Negative for adenopathy. Does not bruise/bleed easily.  Psychiatric/Behavioral: Negative for dysphoric mood. The patient is not nervous/anxious.    Per HPI unless specifically indicated above     Objective:    BP 130/70 mmHg  Pulse 72  Temp(Src) 98.3 F (36.8 C) (Oral)  Ht 5' (1.524 m)  Wt 111 lb 4 oz (50.463 kg)  BMI 21.73 kg/m2  Wt Readings from Last 3 Encounters:  07/22/15 111 lb 4 oz (50.463 kg)  06/25/15 111 lb 8 oz (50.576 kg)  05/28/15 112 lb (50.803 kg)    Physical Exam  Constitutional: She is oriented to person, place,  and time. She appears well-developed and well-nourished. No distress.  HENT:  Head: Normocephalic and atraumatic.  Right Ear: Hearing, tympanic membrane, external ear and ear canal normal.  Left Ear: Hearing, tympanic membrane, external ear and ear canal normal.  Nose: Nose normal.  Mouth/Throat: Uvula is midline, oropharynx is clear and moist and mucous membranes are normal. No oropharyngeal exudate, posterior oropharyngeal edema or posterior oropharyngeal erythema.  Eyes: Conjunctivae and EOM are normal. Pupils are equal, round, and reactive to light. No scleral icterus.  Neck: Normal range of motion. Neck supple. No thyromegaly present.  Cardiovascular: Normal rate, regular rhythm, normal heart sounds and intact distal pulses.   No murmur heard. Pulses:      Radial pulses are 2+ on the right side, and 2+ on the left side.  Pulmonary/Chest: Effort normal and breath sounds normal. No respiratory distress. She has no wheezes. She has no rales.  Breast exam - declines  Abdominal: Soft. Bowel sounds are normal. She exhibits no distension and no mass. There is no tenderness. There is no rebound and no guarding.  Genitourinary:  GYN - deferred  Musculoskeletal: Normal range of motion. She exhibits no edema.  Lymphadenopathy:    She has no cervical adenopathy.  Neurological: She is alert and oriented to person, place, and time.  CN grossly intact, station and gait intact  Skin: Skin is warm and dry. No rash noted.  Hyperkeratotic verrucous lesion on right forearm   Psychiatric: She has a normal mood and affect. Her behavior is normal. Judgment and thought content normal.  Nursing note and vitals reviewed.  Results for orders placed or performed in visit on 07/17/15  Lipid panel  Result Value Ref Range   Cholesterol 196 0 - 200 mg/dL   Triglycerides 74.0 0.0 - 149.0 mg/dL   HDL 77.80 >39.00 mg/dL   VLDL 14.8 0.0 - 40.0 mg/dL   LDL Cholesterol 103 (H) 0 - 99 mg/dL   Total CHOL/HDL Ratio  3    NonHDL 118.09   Comprehensive metabolic panel  Result Value Ref Range   Sodium 142 135 - 145 mEq/L   Potassium 4.0 3.5 - 5.1 mEq/L   Chloride 105 96 - 112 mEq/L   CO2 32 19 - 32 mEq/L   Glucose, Bld 78 70 - 99 mg/dL   BUN 17 6 - 23 mg/dL   Creatinine, Ser 0.83 0.40 - 1.20 mg/dL   Total Bilirubin 0.7 0.2 - 1.2 mg/dL   Alkaline Phosphatase 63 39 - 117 U/L   AST 26 0 - 37 U/L   ALT 26 0 - 35 U/L   Total Protein 7.4 6.0 - 8.3 g/dL   Albumin 4.2 3.5 - 5.2 g/dL   Calcium 9.6 8.4 - 10.5 mg/dL   GFR 73.35 >60.00 mL/min  Vitamin B12  Result Value Ref Range   Vitamin B-12 435 211 - 911 pg/mL      Assessment & Plan:  Hep C screen next visit Problem List Items Addressed This Visit    Vitamin B12 deficiency    Continue B12. Levels normal.  Verruca vulgaris    Rpt treatment today. If not resolved, discussed salicylic acid treatment. If no better, return for shave biopsy.       Healthcare maintenance - Primary    Preventative protocols reviewed and updated unless pt declined. Discussed healthy diet and lifestyle.       Essential hypertension    Chronic, stable. Continue current regimen.      Crohn's disease    Planning on establishing with Dr Collene Mares as Dr Olevia Perches has retired. Currently in remission with 6MP.       Anxiety and depression    Stable. Continue sertraline.           Follow up plan: Return in about 1 year (around 07/21/2016), or as needed, for annual exam, prior fasting for blood work.

## 2015-07-22 NOTE — Progress Notes (Signed)
Pre visit review using our clinic review tool, if applicable. No additional management support is needed unless otherwise documented below in the visit note. 

## 2015-07-22 NOTE — Assessment & Plan Note (Signed)
Rpt treatment today. If not resolved, discussed salicylic acid treatment. If no better, return for shave biopsy.

## 2015-07-22 NOTE — Assessment & Plan Note (Signed)
Stable. Continue sertraline.

## 2015-07-22 NOTE — Assessment & Plan Note (Signed)
Chronic, stable. Continue current regimen. 

## 2015-07-22 NOTE — Assessment & Plan Note (Signed)
Preventative protocols reviewed and updated unless pt declined. Discussed healthy diet and lifestyle.  

## 2015-07-22 NOTE — Assessment & Plan Note (Signed)
Continue B12. Levels normal.

## 2015-07-22 NOTE — Assessment & Plan Note (Signed)
Planning on establishing with Dr Collene Mares as Dr Olevia Perches has retired. Currently in remission with 6MP.

## 2015-07-26 ENCOUNTER — Ambulatory Visit (INDEPENDENT_AMBULATORY_CARE_PROVIDER_SITE_OTHER)
Admission: RE | Admit: 2015-07-26 | Discharge: 2015-07-26 | Disposition: A | Discharge status: Home / Self Care | Payer: PRIVATE HEALTH INSURANCE | Source: Ambulatory Visit | Attending: Family Medicine | Admitting: Family Medicine

## 2015-07-26 ENCOUNTER — Ambulatory Visit (INDEPENDENT_AMBULATORY_CARE_PROVIDER_SITE_OTHER): Payer: PRIVATE HEALTH INSURANCE | Admitting: Family Medicine

## 2015-07-26 ENCOUNTER — Encounter: Payer: Self-pay | Admitting: Family Medicine

## 2015-07-26 VITALS — BP 118/70 | HR 80 | Temp 97.9°F | Wt 111.0 lb

## 2015-07-26 DIAGNOSIS — S0083XA Contusion of other part of head, initial encounter: Secondary | ICD-10-CM | POA: Insufficient documentation

## 2015-07-26 DIAGNOSIS — Z1159 Encounter for screening for other viral diseases: Secondary | ICD-10-CM

## 2015-07-26 DIAGNOSIS — M79672 Pain in left foot: Secondary | ICD-10-CM

## 2015-07-26 DIAGNOSIS — M25579 Pain in unspecified ankle and joints of unspecified foot: Secondary | ICD-10-CM | POA: Insufficient documentation

## 2015-07-26 LAB — CBC WITH DIFFERENTIAL/PLATELET
BASOS ABS: 0 10*3/uL (ref 0.0–0.1)
BASOS PCT: 0 % (ref 0–1)
EOS ABS: 0.2 10*3/uL (ref 0.0–0.7)
Eosinophils Relative: 2 % (ref 0–5)
HCT: 34.2 % — ABNORMAL LOW (ref 36.0–46.0)
HEMOGLOBIN: 11.5 g/dL — AB (ref 12.0–15.0)
Lymphocytes Relative: 31 % (ref 12–46)
Lymphs Abs: 2.8 10*3/uL (ref 0.7–4.0)
MCH: 33 pg (ref 26.0–34.0)
MCHC: 33.6 g/dL (ref 30.0–36.0)
MCV: 98 fL (ref 78.0–100.0)
MONOS PCT: 8 % (ref 3–12)
MPV: 8.6 fL (ref 8.6–12.4)
Monocytes Absolute: 0.7 10*3/uL (ref 0.1–1.0)
NEUTROS ABS: 5.3 10*3/uL (ref 1.7–7.7)
NEUTROS PCT: 59 % (ref 43–77)
PLATELETS: 324 10*3/uL (ref 150–400)
RBC: 3.49 MIL/uL — AB (ref 3.87–5.11)
RDW: 14.6 % (ref 11.5–15.5)
WBC: 9 10*3/uL (ref 4.0–10.5)

## 2015-07-26 LAB — URIC ACID: URIC ACID, SERUM: 3.7 mg/dL (ref 2.4–7.0)

## 2015-07-26 MED ORDER — PREDNISONE 20 MG PO TABS: ORAL_TABLET | ORAL | Status: DC

## 2015-07-26 NOTE — Progress Notes (Signed)
BP 118/70 mmHg  Pulse 80  Temp(Src) 97.9 F (36.6 C) (Oral)  Wt 111 lb (50.349 kg)   CC: foot swelling, black eye Subjective:    Patient ID: Michelle Vasquez, female    DOB: 1951-08-09, 64 y.o.   MRN: [id number]  HPI: Michelle Vasquez is a 64 y.o. female presenting on 07/26/2015 for Foot Swelling and Black Eye   Seen 9/26 for CPE - doing well at that time. No known h/o gout. Crohn's disease controlled with mercaptopurine.  L foot pain - started 2 nights ago woke her up from sleep. Progressively worsening. Points to dorsal and medial 1st MTP joint. Noted significant swelling as well. Last night iced foot and took naproxyn which helped. Denies inciting trauma/injury. No numbness or weakness, no calf swelling or pain.   No increase in alcohol, organ meats or seafood.   Bruise under R eye appeared this morning. No known trauma. Not painful.   Relevant past medical, surgical, family and social history reviewed and updated as indicated. Interim medical history since our last visit reviewed. Allergies and medications reviewed and updated. Current Outpatient Prescriptions on File Prior to Visit  Medication Sig  . amLODipine (NORVASC) 5 MG tablet Take 1 tablet (5 mg total) by mouth daily. NEED TO SCHEDULE ANNUAL PHYSICAL FORM ORE REFILLS [phone]  . cetirizine (ZYRTEC) 10 MG tablet Take 10 mg by mouth at bedtime.  . cyanocobalamin (,VITAMIN B-12,) 1000 MCG/ML injection Inject 1000 mcg/ml IM monthly x 6 months. Please, dispense 3 cc syringes and 25 G x 1 inch needles also.  . mercaptopurine (PURINETHOL) 50 MG tablet Take 1 tablet (50 mg total) by mouth daily. Give on an empty stomach 1 hour before or 2 hours after meals. Caution: Chemotherapy.  . naproxen (NAPROSYN) 375 MG tablet Take 1 tablet (375 mg total) by mouth 2 (two) times daily with a meal.  . omeprazole (PRILOSEC) 40 MG capsule TAKE ONE CAPSULE BY MOUTH DAILY  . sertraline (ZOLOFT) 100 MG tablet TAKE 1 TABLET BY MOUTH DAILY    No current facility-administered medications on file prior to visit.    Review of Systems Per HPI unless specifically indicated above     Objective:    BP 118/70 mmHg  Pulse 80  Temp(Src) 97.9 F (36.6 C) (Oral)  Wt 111 lb (50.349 kg)  Wt Readings from Last 3 Encounters:  07/26/15 111 lb (50.349 kg)  07/22/15 111 lb 4 oz (50.463 kg)  06/25/15 111 lb 8 oz (50.576 kg)    Physical Exam  Constitutional: She appears well-developed and well-nourished. No distress.  Eyes: Conjunctivae and EOM are normal. Pupils are equal, round, and reactive to light. No scleral icterus.  Musculoskeletal: She exhibits no edema.  R foot WNL L foot pain and swelling at 1st MTP as well as dorsal foot pain 1st /2nd MTs. Painful with flexion/extension at MTP. No significant erythema.  2+ DP bilaterally Sensation intact  Skin: Skin is warm and dry. Bruising noted. No rash noted.  Small bruise under R eyelid  Nursing note and vitals reviewed.  Results for orders placed or performed in visit on 07/17/15  Lipid panel  Result Value Ref Range   Cholesterol 196 0 - 200 mg/dL   Triglycerides 74.0 0.0 - 149.0 mg/dL   HDL 77.80 >39.00 mg/dL   VLDL 14.8 0.0 - 40.0 mg/dL   LDL Cholesterol 103 (H) 0 - 99 mg/dL   Total CHOL/HDL Ratio 3    NonHDL 118.09   Comprehensive metabolic  panel  Result Value Ref Range   Sodium 142 135 - 145 mEq/L   Potassium 4.0 3.5 - 5.1 mEq/L   Chloride 105 96 - 112 mEq/L   CO2 32 19 - 32 mEq/L   Glucose, Bld 78 70 - 99 mg/dL   BUN 17 6 - 23 mg/dL   Creatinine, Ser 0.83 0.40 - 1.20 mg/dL   Total Bilirubin 0.7 0.2 - 1.2 mg/dL   Alkaline Phosphatase 63 39 - 117 U/L   AST 26 0 - 37 U/L   ALT 26 0 - 35 U/L   Total Protein 7.4 6.0 - 8.3 g/dL   Albumin 4.2 3.5 - 5.2 g/dL   Calcium 9.6 8.4 - 10.5 mg/dL   GFR 73.35 >60.00 mL/min  Vitamin B12  Result Value Ref Range   Vitamin B-12 435 211 - 911 pg/mL      Assessment & Plan:   Problem List Items Addressed This Visit    Left  foot pain - Primary    Anticipate L podagra from gout - check uric acid level, CBC. Check xray of foot - if no fracture, will start prednisone course. Avoid NSAIDs due to crohn's disease.  Discussed foods to avoid, discussed pathophysiology of gout Pt agrees with plan.      Relevant Orders   DG Foot Complete Left   Uric acid   CBC with Differential/Platelet   Bruise of face    Inferior R eyelid. No other easy bruising/bleeding noted. Anticipate due to trauma she does not remember (may have dropped something on eye). No other systemic side effects. Advised monitor for resolution as expected, monitor for any new or easy bruising and update me if this happens. Check CBC today.       Other Visit Diagnoses    Need for hepatitis C screening test        Relevant Orders    Hepatitis C antibody, reflex        Follow up plan: No Follow-up on file.

## 2015-07-26 NOTE — Progress Notes (Signed)
Pre visit review using our clinic review tool, if applicable. No additional management support is needed unless otherwise documented below in the visit note. 

## 2015-07-26 NOTE — Patient Instructions (Signed)
I am suspicious for gout flare of foot - treat with prednisone course. Keep leg elevated.  Blood work today. Xray without fracture today.

## 2015-07-26 NOTE — Assessment & Plan Note (Signed)
Inferior R eyelid. No other easy bruising/bleeding noted. Anticipate due to trauma she does not remember (may have dropped something on eye). No other systemic side effects. Advised monitor for resolution as expected, monitor for any new or easy bruising and update me if this happens. Check CBC today.

## 2015-07-26 NOTE — Assessment & Plan Note (Signed)
Anticipate L podagra from gout - check uric acid level, CBC. Check xray of foot - if no fracture, will start prednisone course. Avoid NSAIDs due to crohn's disease.  Discussed foods to avoid, discussed pathophysiology of gout Pt agrees with plan.

## 2015-07-27 LAB — HEPATITIS C ANTIBODY: HCV Ab: NEGATIVE

## 2015-08-06 ENCOUNTER — Encounter: Payer: Self-pay | Admitting: Family Medicine

## 2015-08-06 DIAGNOSIS — K5010 Crohn's disease of large intestine without complications: Secondary | ICD-10-CM

## 2015-08-06 NOTE — Telephone Encounter (Signed)
Patient wants referral to Dr. Collene Mares since Dr. Olevia Perches retired.

## 2015-08-07 NOTE — Telephone Encounter (Signed)
Referral placed.

## 2015-08-25 ENCOUNTER — Other Ambulatory Visit: Payer: Self-pay | Admitting: Family Medicine

## 2015-10-08 ENCOUNTER — Telehealth: Payer: Self-pay | Admitting: Family Medicine

## 2015-10-08 NOTE — Telephone Encounter (Signed)
error 

## 2015-10-10 ENCOUNTER — Other Ambulatory Visit: Payer: Self-pay | Admitting: *Deleted

## 2015-10-10 ENCOUNTER — Telehealth: Payer: Self-pay | Admitting: *Deleted

## 2015-10-10 DIAGNOSIS — E538 Deficiency of other specified B group vitamins: Secondary | ICD-10-CM

## 2015-10-10 NOTE — Telephone Encounter (Signed)
Left a message for patient to call back. Lab in [facility].

## 2015-10-10 NOTE — Telephone Encounter (Signed)
ok 

## 2015-10-10 NOTE — Telephone Encounter (Signed)
Spoke with patient and she will come for labs. Scheduled OV with Dr. Silverio Decamp in February.

## 2015-10-10 NOTE — Telephone Encounter (Signed)
Patient requests Dr. Silverio Decamp as new GI MD. She is due for a B 12 level to be checked. Dr. Silverio Decamp, do you want this done at this time?

## 2015-10-16 ENCOUNTER — Other Ambulatory Visit: Payer: Self-pay

## 2015-10-16 ENCOUNTER — Encounter: Payer: Self-pay | Admitting: Gastroenterology

## 2015-10-16 ENCOUNTER — Encounter: Payer: Self-pay | Admitting: Family Medicine

## 2015-10-16 ENCOUNTER — Ambulatory Visit (INDEPENDENT_AMBULATORY_CARE_PROVIDER_SITE_OTHER): Payer: PRIVATE HEALTH INSURANCE | Admitting: Family Medicine

## 2015-10-16 ENCOUNTER — Ambulatory Visit: Payer: PRIVATE HEALTH INSURANCE | Admitting: Family Medicine

## 2015-10-16 VITALS — BP 122/62 | HR 81 | Temp 98.4°F | Wt 109.0 lb

## 2015-10-16 DIAGNOSIS — E538 Deficiency of other specified B group vitamins: Secondary | ICD-10-CM | POA: Diagnosis not present

## 2015-10-16 DIAGNOSIS — J0100 Acute maxillary sinusitis, unspecified: Secondary | ICD-10-CM

## 2015-10-16 DIAGNOSIS — J019 Acute sinusitis, unspecified: Secondary | ICD-10-CM | POA: Insufficient documentation

## 2015-10-16 DIAGNOSIS — R319 Hematuria, unspecified: Secondary | ICD-10-CM | POA: Insufficient documentation

## 2015-10-16 LAB — POCT URINALYSIS DIPSTICK
GLUCOSE UA: NEGATIVE
Ketones, UA: NEGATIVE
Leukocytes, UA: NEGATIVE
NITRITE UA: NEGATIVE
Spec Grav, UA: >=1.03
Urobilinogen, UA: 0.2
pH, UA: 6

## 2015-10-16 LAB — VITAMIN B12: Vitamin B-12: 288 pg/mL (ref 211–911)

## 2015-10-16 MED ORDER — CYANOCOBALAMIN 1000 MCG/ML IJ SOLN: INTRAMUSCULAR | Status: DC

## 2015-10-16 MED ORDER — CYANOCOBALAMIN 1000 MCG/ML IJ SOLN
1000.0000 ug | Freq: Once | INTRAMUSCULAR | Status: AC
  Administered 2015-10-16: 1000 ug via INTRAMUSCULAR

## 2015-10-16 MED ORDER — LEVOFLOXACIN 500 MG PO TABS: 500.0000 mg | ORAL_TABLET | Freq: Every day | ORAL | Status: DC

## 2015-10-16 NOTE — Addendum Note (Signed)
Addended by: Cloyd Stagers B on: 10/16/2015 10:42 AM   Modules accepted: Orders, SmartSet

## 2015-10-16 NOTE — Assessment & Plan Note (Signed)
Check B12 level then will give B12 shot today per pt request.

## 2015-10-16 NOTE — Patient Instructions (Addendum)
B12 lab test then B12 shot here today. You have a sinus infection. Take medicine as prescribed: levaquin 5106m daily for 7 days This should also treat possible urine infection.  Push fluids and plenty of rest. Nasal saline irrigation or neti pot to help drain sinuses. May use plain mucinex with plenty of fluid to help mobilize mucous. Please let [place]now if fever >101.5, trouble opening/closing mouth, difficulty swallowing, or worsening instead of improving as expected.

## 2015-10-16 NOTE — Addendum Note (Signed)
Addended by: Marchia Bond on: 10/16/2015 10:53 AM   Modules accepted: Orders, SmartSet

## 2015-10-16 NOTE — Assessment & Plan Note (Addendum)
UA and micro with hematuria - treat with levaquin antibiotic course. Encouraged push fluids and rest. UCx sent today. If UCx negative, pt will need rpt UA to ensure hematuria has resolved.

## 2015-10-16 NOTE — Assessment & Plan Note (Signed)
Given duration and progression of sxs will treat with levaquin antibiotic - abx chosen to cover both UTI and sinusitis. See pt instructions for further supportive care.

## 2015-10-16 NOTE — Progress Notes (Signed)
BP 122/62 mmHg  Pulse 81  Temp(Src) 98.4 F (36.9 C) (Oral)  Wt 109 lb (49.442 kg)  SpO2 97%   CC: sinus and urine infection?  Subjective:    Patient ID: Michelle Vasquez, female    DOB: May 04, 1951, 64 y.o.   MRN: [id number]  HPI: Michelle Vasquez is a 64 y.o. female presenting on 10/16/2015 for Sinusitis and Urinary Tract Infection   2 wk h/o sinus congestion, drainage, nausea, decreased appetite. Eye pain. Nose bleeding. Some lightheadedness. + tinnitus. Fever last week but not since. + L ear and tooth pain. Last week had mouth ulcers.  5d h/o urinary urgency with incomplete emptying, dysuria, bladder pressure. No increased frequency. No flank pain, abd pain, bladder pain.   So far has tried benadryl/zyrtec No sick contacts at home, + at work.  No smokers at home.   Known crohn's on mercaptopurine.  Requests B12 shot as well as B12 level. Due for recheck in [place] but requests checked here today because she feels ill. Last injection was ~09/05/2015 at work.   Relevant past medical, surgical, family and social history reviewed and updated as indicated. Interim medical history since our last visit reviewed. Allergies and medications reviewed and updated. Current Outpatient Prescriptions on File Prior to Visit  Medication Sig  . amLODipine (NORVASC) 5 MG tablet TAKE 1 TABLET BY MOUTH DAILY  . cetirizine (ZYRTEC) 10 MG tablet Take 10 mg by mouth at bedtime.  . cyanocobalamin (,VITAMIN B-12,) 1000 MCG/ML injection Inject 1000 mcg/ml IM monthly x 6 months. Please, dispense 3 cc syringes and 25 G x 1 inch needles also.  . mercaptopurine (PURINETHOL) 50 MG tablet Take 1 tablet (50 mg total) by mouth daily. Give on an empty stomach 1 hour before or 2 hours after meals. Caution: Chemotherapy.  . naproxen (NAPROSYN) 375 MG tablet Take 1 tablet (375 mg total) by mouth 2 (two) times daily with a meal.  . omeprazole (PRILOSEC) 40 MG capsule TAKE ONE CAPSULE BY MOUTH DAILY  . sertraline  (ZOLOFT) 100 MG tablet TAKE 1 TABLET BY MOUTH DAILY   No current facility-administered medications on file prior to visit.    Review of Systems Per HPI unless specifically indicated in ROS section     Objective:    BP 122/62 mmHg  Pulse 81  Temp(Src) 98.4 F (36.9 C) (Oral)  Wt 109 lb (49.442 kg)  SpO2 97%  Wt Readings from Last 3 Encounters:  10/16/15 109 lb (49.442 kg)  07/26/15 111 lb (50.349 kg)  07/22/15 111 lb 4 oz (50.463 kg)    Physical Exam  Constitutional: She appears well-developed and well-nourished. No distress.  Tired appearing  HENT:  Head: Normocephalic and atraumatic.  Right Ear: Hearing, tympanic membrane, external ear and ear canal normal.  Left Ear: Hearing, tympanic membrane, external ear and ear canal normal.  Nose: Mucosal edema present. No rhinorrhea. Right sinus exhibits maxillary sinus tenderness. Right sinus exhibits no frontal sinus tenderness. Left sinus exhibits maxillary sinus tenderness. Left sinus exhibits no frontal sinus tenderness.  Mouth/Throat: Uvula is midline, oropharynx is clear and moist and mucous membranes are normal. No oropharyngeal exudate, posterior oropharyngeal edema, posterior oropharyngeal erythema or tonsillar abscesses.  Eyes: Conjunctivae and EOM are normal. Pupils are equal, round, and reactive to light. No scleral icterus.  Neck: Normal range of motion. Neck supple.  Cardiovascular: Normal rate, regular rhythm, normal heart sounds and intact distal pulses.   No murmur heard. Pulmonary/Chest: Effort normal and breath sounds normal.  No respiratory distress. She has no wheezes. She has no rales.  Abdominal: Soft. Normal appearance and bowel sounds are normal. She exhibits no distension and no mass. There is no hepatosplenomegaly. There is tenderness (mild) in the right lower quadrant, suprapubic area and left lower quadrant. There is no rigidity, no rebound, no guarding, no CVA tenderness and negative Murphy's sign.    Lymphadenopathy:    She has no cervical adenopathy.  Skin: Skin is warm and dry. No rash noted.  Nursing note and vitals reviewed.  Results for orders placed or performed in visit on 10/16/15  POCT urinalysis dipstick  Result Value Ref Range   Color, UA Yellow    Clarity, UA Clear    Glucose, UA Neg    Bilirubin, UA 1+    Ketones, UA Neg    Spec Grav, UA >=1.030    Blood, UA 1+    pH, UA 6.0    Protein, UA Trace    Urobilinogen, UA 0.2    Nitrite, UA Neg    Leukocytes, UA Negative Negative      Assessment & Plan:   Problem List Items Addressed This Visit    Vitamin B12 deficiency    Check B12 level then will give B12 shot today per pt request.      Hematuria    UA and micro with hematuria - treat with levaquin antibiotic course. Encouraged push fluids and rest. UCx sent today. If UCx negative, pt will need rpt UA to ensure hematuria has resolved.      Relevant Orders   Urine culture   POCT urinalysis dipstick (Completed)   Acute sinusitis - Primary    Given duration and progression of sxs will treat with levaquin antibiotic - abx chosen to cover both UTI and sinusitis. See pt instructions for further supportive care.      Relevant Medications   levofloxacin (LEVAQUIN) 500 MG tablet       Follow up plan: Return if symptoms worsen or fail to improve.

## 2015-10-16 NOTE — Addendum Note (Signed)
Addended by: [name] on: 10/16/2015 08:23 AM   Modules accepted: Miquel Dunn

## 2015-10-17 LAB — URINE CULTURE
Colony Count: NO GROWTH
Organism ID, Bacteria: NO GROWTH

## 2015-10-18 ENCOUNTER — Telehealth: Payer: Self-pay | Admitting: Family Medicine

## 2015-10-18 NOTE — Telephone Encounter (Signed)
Patient notified of these results

## 2015-10-18 NOTE — Telephone Encounter (Signed)
Patient returned Michelle Vasquez's call about her lab results.

## 2015-10-23 ENCOUNTER — Other Ambulatory Visit: Payer: Self-pay | Admitting: Family Medicine

## 2015-10-23 DIAGNOSIS — R319 Hematuria, unspecified: Secondary | ICD-10-CM

## 2015-10-28 ENCOUNTER — Other Ambulatory Visit: Payer: Self-pay | Admitting: Family Medicine

## 2015-10-30 ENCOUNTER — Encounter: Payer: Self-pay | Admitting: Family Medicine

## 2015-10-30 ENCOUNTER — Other Ambulatory Visit (INDEPENDENT_AMBULATORY_CARE_PROVIDER_SITE_OTHER): Payer: PRIVATE HEALTH INSURANCE

## 2015-10-30 ENCOUNTER — Other Ambulatory Visit: Payer: PRIVATE HEALTH INSURANCE

## 2015-10-30 DIAGNOSIS — R319 Hematuria, unspecified: Secondary | ICD-10-CM | POA: Diagnosis not present

## 2015-10-31 LAB — URINALYSIS, ROUTINE W REFLEX MICROSCOPIC
Bilirubin Urine: NEGATIVE
Hgb urine dipstick: NEGATIVE
KETONES UR: NEGATIVE
Leukocytes, UA: NEGATIVE
Nitrite: NEGATIVE
PH: 6 (ref 5.0–8.0)
SPECIFIC GRAVITY, URINE: 1.01 (ref 1.000–1.030)
TOTAL PROTEIN, URINE-UPE24: NEGATIVE
UROBILINOGEN UA: 0.2 (ref 0.0–1.0)
Urine Glucose: NEGATIVE
WBC UA: NONE SEEN (ref 0–?)

## 2015-11-06 ENCOUNTER — Encounter: Payer: Self-pay | Admitting: Gastroenterology

## 2015-11-07 ENCOUNTER — Other Ambulatory Visit: Payer: Self-pay

## 2015-11-07 MED ORDER — MERCAPTOPURINE 50 MG PO TABS: 50.0000 mg | ORAL_TABLET | Freq: Every day | ORAL | Status: DC

## 2015-12-09 ENCOUNTER — Other Ambulatory Visit: Payer: Self-pay | Admitting: Gastroenterology

## 2015-12-12 ENCOUNTER — Other Ambulatory Visit: Payer: Self-pay

## 2015-12-12 ENCOUNTER — Encounter: Payer: Self-pay | Admitting: Gastroenterology

## 2015-12-12 MED ORDER — MERCAPTOPURINE 50 MG PO TABS: 50.0000 mg | ORAL_TABLET | Freq: Every day | ORAL | Status: DC

## 2015-12-18 ENCOUNTER — Other Ambulatory Visit (INDEPENDENT_AMBULATORY_CARE_PROVIDER_SITE_OTHER): Payer: PRIVATE HEALTH INSURANCE

## 2015-12-18 ENCOUNTER — Encounter: Payer: Self-pay | Admitting: Gastroenterology

## 2015-12-18 ENCOUNTER — Ambulatory Visit (INDEPENDENT_AMBULATORY_CARE_PROVIDER_SITE_OTHER): Payer: PRIVATE HEALTH INSURANCE | Admitting: Gastroenterology

## 2015-12-18 VITALS — BP 132/74 | HR 78 | Ht 60.0 in | Wt 112.2 lb

## 2015-12-18 DIAGNOSIS — E538 Deficiency of other specified B group vitamins: Secondary | ICD-10-CM

## 2015-12-18 DIAGNOSIS — K5010 Crohn's disease of large intestine without complications: Secondary | ICD-10-CM | POA: Diagnosis not present

## 2015-12-18 LAB — HIGH SENSITIVITY CRP: CRP HIGH SENSITIVITY: 0.43 mg/L (ref 0.000–5.000)

## 2015-12-18 LAB — HEPATIC FUNCTION PANEL
ALBUMIN: 4.4 g/dL (ref 3.5–5.2)
ALT: 28 U/L (ref 0–35)
AST: 26 U/L (ref 0–37)
Alkaline Phosphatase: 85 U/L (ref 39–117)
Bilirubin, Direct: 0.1 mg/dL (ref 0.0–0.3)
TOTAL PROTEIN: 7.4 g/dL (ref 6.0–8.3)
Total Bilirubin: 0.4 mg/dL (ref 0.2–1.2)

## 2015-12-18 LAB — BASIC METABOLIC PANEL
BUN: 14 mg/dL (ref 6–23)
CHLORIDE: 105 meq/L (ref 96–112)
CO2: 30 meq/L (ref 19–32)
CREATININE: 0.75 mg/dL (ref 0.40–1.20)
Calcium: 9.4 mg/dL (ref 8.4–10.5)
GFR: 82.35 mL/min (ref >60.00)
GLUCOSE: 105 mg/dL — AB (ref 70–99)
POTASSIUM: 4.1 meq/L (ref 3.5–5.1)
Sodium: 142 mEq/L (ref 135–145)

## 2015-12-18 LAB — CBC WITH DIFFERENTIAL/PLATELET
BASOS ABS: 0 10*3/uL (ref 0.0–0.1)
Basophils Relative: 0.6 % (ref 0.0–3.0)
Eosinophils Absolute: 0.1 10*3/uL (ref 0.0–0.7)
Eosinophils Relative: 1.4 % (ref 0.0–5.0)
HEMATOCRIT: 35.7 % — AB (ref 36.0–46.0)
HEMOGLOBIN: 11.9 g/dL — AB (ref 12.0–15.0)
LYMPHS PCT: 36.8 % (ref 12.0–46.0)
Lymphs Abs: 2.5 10*3/uL (ref 0.7–4.0)
MCHC: 33.5 g/dL (ref 30.0–36.0)
MCV: 99.3 fl (ref 78.0–100.0)
MONOS PCT: 8 % (ref 3.0–12.0)
Monocytes Absolute: 0.5 10*3/uL (ref 0.1–1.0)
NEUTROS ABS: 3.7 10*3/uL (ref 1.4–7.7)
Neutrophils Relative %: 53.2 % (ref 43.0–77.0)
PLATELETS: 249 10*3/uL (ref 150.0–400.0)
RBC: 3.59 Mil/uL — AB (ref 3.87–5.11)
RDW: 15.5 % (ref 11.5–15.5)
WBC: 6.9 10*3/uL (ref 4.0–10.5)

## 2015-12-18 LAB — SEDIMENTATION RATE: SED RATE: 22 mm/h (ref 0–22)

## 2015-12-18 MED ORDER — MERCAPTOPURINE 50 MG PO TABS: 50.0000 mg | ORAL_TABLET | Freq: Every day | ORAL | Status: DC

## 2015-12-18 NOTE — Patient Instructions (Addendum)
You will go to the basement today for labs You will have follow up labs in 6 months (CBC,BMET,HEPATIC,ESR,CRP) We will call and remind you when they are due in 6 months Follow up in 1 year

## 2015-12-18 NOTE — Progress Notes (Signed)
Michelle Vasquez    [id number]    [birth year]  Primary Care Physician:Javier Danise Mina, MD  Referring Physician: [name], MD [address] [place], [place] [address]  Chief complaint:  Crohn's ileitis   HPI: 65 year old on white female with Crohn's disease of the terminal ileum previously followed by Dr. Olevia Perches is here to establish care with me. TI resection in 1981.  Last office visit August 2016. She has history of B12 deficiency and is on B12 supplements. Colonoscopy in May 4680 showed ileocolic stricture 5 mm in diameter. Small bowel follow-through ,however, showed no evidence of obstruction. Barium passed through the ileocolic anastomosis without delay. There was a mild stricture but clinically not significant. She has been treated with Flagyl 250 Mg 3 times a day and Entocort taper with no significant difference in her symptoms. Her weight is stable. She is on 6MP 50 mg daily. Denies any nausea, vomiting, abdominal pain, melena or bright red blood per rectum    Outpatient Encounter Prescriptions as of 12/18/2015  Medication Sig  . amLODipine (NORVASC) 5 MG tablet TAKE 1 TABLET BY MOUTH DAILY  . cetirizine (ZYRTEC) 10 MG tablet Take 10 mg by mouth at bedtime.  . cyanocobalamin (,VITAMIN B-12,) 1000 MCG/ML injection Inject 1000 mcg/ml IM monthly x 6 months. Please, dispense 3 cc syringes and 25 G x 1 inch needles also.  . mercaptopurine (PURINETHOL) 50 MG tablet Take 1 tablet (50 mg total) by mouth daily. Give on an empty stomach 1 hour before or 2 hours after meals. Caution: Chemotherapy.  Marland Kitchen omeprazole (PRILOSEC) 40 MG capsule TAKE ONE CAPSULE BY MOUTH DAILY  . sertraline (ZOLOFT) 100 MG tablet TAKE 1 TABLET BY MOUTH DAILY  . [DISCONTINUED] levofloxacin (LEVAQUIN) 500 MG tablet Take 1 tablet (500 mg total) by mouth daily.  . [DISCONTINUED] naproxen (NAPROSYN) 375 MG tablet Take 1 tablet (375 mg total) by mouth 2 (two) times daily with a meal.   No  facility-administered encounter medications on file as of 12/18/2015.    Allergies as of 12/18/2015 - Review Complete 12/18/2015  Allergen Reaction Noted  . Celexa [citalopram hydrobromide] Other (See Comments) 01/10/2014    Past Medical History  Diagnosis Date  . HTN (hypertension)   . Crohn disease ([place])     s/p colectomy on 6MP, ?bacterial overgrowth  . History of small bowel obstruction   . Osteomalacia, unspecified   . Unspecified deficiency anemia   . B12 deficiency   . Internal hemorrhoids without mention of complication   . Unspecified polyarthropathy or polyarthritis, multiple sites   . Central hearing loss   . Perennial allergic rhinitis with seasonal variation   . Fatty liver 12/15/11  . Anxiety and depression 07/03/2011  . Allergy     seasonal  . Blood transfusion without reported diagnosis     Past Surgical History  Procedure Laterality Date  . Total abdominal hysterectomy  1975    ovaries out as well  . Appendectomy    . Other surgical history  1960s, 1978, 1980    ileal resection x 3 and subtotal colon resection  . Esophagogastroduodenoscopy  02/25/05    Dilated stricture  . Colonoscopy  02/25/05    Internal hemms; crohns; narrow anast  . Colonoscopy  02/26/10    Prior right hemicolectomy o/w benign (Dr. Olevia Perches)  . Hemicolectomy  1980    all small intestine, none large  . Dexa  07/06/2011    normal T  score -1.0 femur, spine  . Upper gastrointestinal endoscopy    . Colonoscopy  02/2015    benign biopsies, crohn's with stenotic stricture at anastomosis Olevia Perches)    Family History  Problem Relation Age of Onset  . Crohn's disease Father     colostomy  . Hypertension Mother   . Heart disease Mother 45  . Alcohol abuse Brother   . Cirrhosis Brother   . Crohn's disease Sister   . Crohn's disease Paternal Aunt     x 3  . Crohn's disease      neice x 2  . Colon cancer Neg Hx   . Cancer Neg Hx   . Diabetes Neg Hx   . Rectal cancer Neg Hx   . Stomach cancer  Neg Hx     Social History   Social History  . Marital Status: Married    Spouse Name: N/A  . Number of Children: 3  . Years of Education: N/A   Occupational History  . Medical Transcription Other   Social History Main Topics  . Smoking status: Former Smoker -- 0.25 packs/day for 15 years    Types: Cigarettes    Quit date: 10/26/1978  . Smokeless tobacco: Never Used  . Alcohol Use: No  . Drug Use: No  . Sexual Activity: Not on file   Other Topics Concern  . 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  HENT: Negative.   Eyes: Negative for blurred vision.  Respiratory: Negative for cough, shortness of breath and wheezing.   Cardiovascular: Negative for chest pain and palpitations.  Gastrointestinal: as per HPI Genitourinary: Negative for dysuria, urgency, frequency and hematuria.  Musculoskeletal: Negative for myalgias, back pain and joint pain.  Skin: Negative for itching and rash.  Neurological: Negative for dizziness, tremors, focal weakness, seizures and loss of consciousness.  Endo/Heme/Allergies: Negative for environmental allergies.  Psychiatric/Behavioral: Negative for depression, suicidal ideas and hallucinations.  All other systems reviewed and are negative.   Physical Exam: Filed Vitals:   12/18/15 1402  BP: 132/74  Pulse: 78   Gen:      No acute distress HEENT:  EOMI, sclera anicteric Neck:     No masses; no thyromegaly Lungs:    Clear to auscultation bilaterally; normal respiratory effort CV:         Regular rate and rhythm; no murmurs Abd:      + bowel sounds; soft, non-tender; no palpable masses, no distension Ext:    No edema; adequate peripheral perfusion Skin:       Warm and dry; no rash Neuro: alert and oriented x 3 Psych: normal mood and affect  Data Reviewed:  Reviewed her chart as per history of present illness   Assessment and Plan/Recommendations: 65 year old female with history of Crohn's ileitis with stricture of neoterminal ileum   on 6-MP 50 mg daily for maintenance here for follow-up visit Currently patient is asymptomatic with good control of symptoms Check CBC, BMP, LFT, ESR CRP B12 level was normal in December 2016, will recheck in 6 months along with the rest of the labs Return in 1 year Recall colonoscopy in 2021  K. Denzil Magnuson , MD [phone] Mon-Fri 8a-5p  [phone] after 5p, weekends, holidays

## 2016-03-02 ENCOUNTER — Encounter: Payer: Self-pay | Admitting: Family Medicine

## 2016-03-03 NOTE — Telephone Encounter (Signed)
Please see Mychart message.

## 2016-03-04 MED ORDER — GUAIFENESIN ER 600 MG PO TB12: 600.0000 mg | ORAL_TABLET | Freq: Two times a day (BID) | ORAL | Status: DC

## 2016-03-05 ENCOUNTER — Telehealth: Payer: Self-pay | Admitting: Gastroenterology

## 2016-03-05 NOTE — Telephone Encounter (Signed)
2 to 3 weeks with worsening sx's of bloating and abdominal pain. She is having explosive bowel movements and then days with no bowel movement-she is now nauseated daily. History of bowel surgery, bowel stricture and blockages in the past. She has Crohn's. Discussed reasons to go to the ER. Appointment scheduled for evaluation.

## 2016-03-12 ENCOUNTER — Ambulatory Visit: Payer: PRIVATE HEALTH INSURANCE | Admitting: Gastroenterology

## 2016-05-12 ENCOUNTER — Other Ambulatory Visit (INDEPENDENT_AMBULATORY_CARE_PROVIDER_SITE_OTHER): Payer: PRIVATE HEALTH INSURANCE

## 2016-05-12 ENCOUNTER — Encounter: Payer: Self-pay | Admitting: Gastroenterology

## 2016-05-12 ENCOUNTER — Ambulatory Visit (INDEPENDENT_AMBULATORY_CARE_PROVIDER_SITE_OTHER): Payer: PRIVATE HEALTH INSURANCE | Admitting: Gastroenterology

## 2016-05-12 VITALS — BP 138/74 | HR 78 | Ht 60.0 in | Wt 116.0 lb

## 2016-05-12 DIAGNOSIS — R1084 Generalized abdominal pain: Secondary | ICD-10-CM

## 2016-05-12 DIAGNOSIS — K5901 Slow transit constipation: Secondary | ICD-10-CM

## 2016-05-12 DIAGNOSIS — D519 Vitamin B12 deficiency anemia, unspecified: Secondary | ICD-10-CM

## 2016-05-12 DIAGNOSIS — K5000 Crohn's disease of small intestine without complications: Secondary | ICD-10-CM

## 2016-05-12 DIAGNOSIS — R140 Abdominal distension (gaseous): Secondary | ICD-10-CM

## 2016-05-12 LAB — COMPREHENSIVE METABOLIC PANEL
ALT: 33 U/L (ref 0–35)
AST: 30 U/L (ref 0–37)
Albumin: 4.4 g/dL (ref 3.5–5.2)
Alkaline Phosphatase: 78 U/L (ref 39–117)
BUN: 10 mg/dL (ref 6–23)
CHLORIDE: 105 meq/L (ref 96–112)
CO2: 30 meq/L (ref 19–32)
Calcium: 9.4 mg/dL (ref 8.4–10.5)
Creatinine, Ser: 0.69 mg/dL (ref 0.40–1.20)
GFR: 90.55 mL/min (ref >60.00)
GLUCOSE: 87 mg/dL (ref 70–99)
POTASSIUM: 3.4 meq/L — AB (ref 3.5–5.1)
SODIUM: 141 meq/L (ref 135–145)
Total Bilirubin: 0.8 mg/dL (ref 0.2–1.2)
Total Protein: 7.5 g/dL (ref 6.0–8.3)

## 2016-05-12 MED ORDER — MERCAPTOPURINE 50 MG PO TABS: 50.0000 mg | ORAL_TABLET | Freq: Every day | ORAL | Status: DC

## 2016-05-12 MED ORDER — OMEPRAZOLE 40 MG PO CPDR
40.0000 mg | DELAYED_RELEASE_CAPSULE | Freq: Every day | ORAL | Status: DC
Start: 2016-05-12 — End: 2017-06-14

## 2016-05-12 NOTE — Patient Instructions (Addendum)
We sent in your refills to your pharmacy Follow up in 3-6 months Use IB guard twice a day as needed Use Phazyme as needed Go to the basement for labs today

## 2016-05-13 LAB — CBC WITH DIFFERENTIAL/PLATELET
BASOS PCT: 1.7 % (ref 0.0–3.0)
Basophils Absolute: 0.1 10*3/uL (ref 0.0–0.1)
EOS ABS: 0.1 10*3/uL (ref 0.0–0.7)
EOS PCT: 1.9 % (ref 0.0–5.0)
HCT: 32.6 % — ABNORMAL LOW (ref 36.0–46.0)
Hemoglobin: 10.7 g/dL — ABNORMAL LOW (ref 12.0–15.0)
LYMPHS ABS: 2.4 10*3/uL (ref 0.7–4.0)
Lymphocytes Relative: 39.6 % (ref 12.0–46.0)
MCHC: 32.8 g/dL (ref 30.0–36.0)
MCV: 100.4 fl — ABNORMAL HIGH (ref 78.0–100.0)
MONO ABS: 0.4 10*3/uL (ref 0.1–1.0)
Monocytes Relative: 6.4 % (ref 3.0–12.0)
NEUTROS PCT: 50.4 % (ref 43.0–77.0)
Neutro Abs: 3.1 10*3/uL (ref 1.4–7.7)
Platelets: 294 10*3/uL (ref 150.0–400.0)
RBC: 3.24 Mil/uL — ABNORMAL LOW (ref 3.87–5.11)
RDW: 16.4 % — AB (ref 11.5–15.5)
WBC: 6.1 10*3/uL (ref 4.0–10.5)

## 2016-05-20 ENCOUNTER — Other Ambulatory Visit: Payer: Self-pay

## 2016-05-20 MED ORDER — FOLIC ACID 1 MG PO TABS: 1.0000 mg | ORAL_TABLET | Freq: Every day | ORAL | 3 refills | Status: DC

## 2016-05-20 MED ORDER — FERROUS SULFATE 325 (65 FE) MG PO TABS: 325.0000 mg | ORAL_TABLET | Freq: Three times a day (TID) | ORAL | 3 refills | Status: DC

## 2016-05-20 MED ORDER — LINACLOTIDE 72 MCG PO CAPS: 72.0000 ug | ORAL_CAPSULE | Freq: Every day | ORAL | 3 refills | Status: DC

## 2016-05-25 NOTE — Progress Notes (Signed)
Michelle Vasquez    [id number]    02/20/1951  Primary Care Physician:Javier Danise Mina, MD  Referring Physician: [name], MD [place], [place] [address]  Chief complaint: Crohn's disease  HPI:  65 year old on white female with Crohn's disease of the terminal ileum is here for follow up visit. She had TI resection in 1981.  Last office visit Feb 2017. She has history of B12 deficiency and is on B12 supplements. Colonoscopy in May 9379 showed ileocolic stricture 5 mm in diameter. Small bowel follow-through ,however, showed no evidence of obstruction. Barium passed through the ileocolic anastomosis without delay. There was a mild stricture but clinically not significant. She has been treated with Flagyl 250 Mg 3 times a day and Entocort taper with no significant difference in her symptoms. Her weight is stable. She is on 6MP 50 mg daily. Denies any nausea, vomiting,  melena or bright red blood per rectum. Complaints of bloating and intermittent abdominal pain associated with cramping or spasms.    Outpatient Encounter Prescriptions as of 05/12/2016  Medication Sig  . amLODipine (NORVASC) 5 MG tablet TAKE 1 TABLET BY MOUTH DAILY  . cetirizine (ZYRTEC) 10 MG tablet Take 10 mg by mouth at bedtime.  . cyanocobalamin (,VITAMIN B-12,) 1000 MCG/ML injection Inject 1000 mcg/ml IM monthly x 6 months. Please, dispense 3 cc syringes and 25 G x 1 inch needles also.  Marland Kitchen guaiFENesin (MUCINEX) 600 MG 12 hr tablet Take 1 tablet (600 mg total) by mouth 2 (two) times daily.  . mercaptopurine (PURINETHOL) 50 MG tablet Take 1 tablet (50 mg total) by mouth daily. Give on an empty stomach 1 hour before or 2 hours after meals. Caution: Chemotherapy.  Marland Kitchen omeprazole (PRILOSEC) 40 MG capsule Take 1 capsule (40 mg total) by mouth daily.  . sertraline (ZOLOFT) 100 MG tablet TAKE 1 TABLET BY MOUTH DAILY  . [DISCONTINUED] mercaptopurine (PURINETHOL) 50 MG tablet Take 1 tablet (50 mg  total) by mouth daily. Give on an empty stomach 1 hour before or 2 hours after meals. Caution: Chemotherapy.  . [DISCONTINUED] omeprazole (PRILOSEC) 40 MG capsule TAKE ONE CAPSULE BY MOUTH DAILY   No facility-administered encounter medications on file as of 05/12/2016.     Allergies as of 05/12/2016 - Review Complete 05/12/2016  Allergen Reaction Noted  . Celexa [citalopram hydrobromide] Other (See Comments) 01/10/2014    Past Medical History:  Diagnosis Date  . Allergy    seasonal  . Anxiety and depression 07/03/2011  . B12 deficiency   . Blood transfusion without reported diagnosis   . Central hearing loss   . Crohn disease ([place])    s/p colectomy on 6MP, ?bacterial overgrowth  . Fatty liver 12/15/11  . History of small bowel obstruction   . HTN (hypertension)   . Internal hemorrhoids without mention of complication   . Osteomalacia, unspecified   . Perennial allergic rhinitis with seasonal variation   . Unspecified deficiency anemia   . Unspecified polyarthropathy or polyarthritis, multiple sites     Past Surgical History:  Procedure Laterality Date  . APPENDECTOMY    . COLONOSCOPY  02/25/05   Internal hemms; crohns; narrow anast  . COLONOSCOPY  02/26/10   Prior right hemicolectomy o/w benign (Dr. Olevia Perches)  . COLONOSCOPY  02/2015   benign biopsies, crohn's with stenotic stricture at anastomosis Olevia Perches)  . DEXA  07/06/2011   normal T score -1.0 femur, spine  . ESOPHAGOGASTRODUODENOSCOPY  02/25/05  Dilated stricture  . HEMICOLECTOMY  1980   all small intestine, none large  . OTHER SURGICAL HISTORY  1960s, 1978, 1980   ileal resection x 3 and subtotal colon resection  . TOTAL ABDOMINAL HYSTERECTOMY  1975   ovaries out as well  . UPPER GASTROINTESTINAL ENDOSCOPY      Family History  Problem Relation Age of Onset  . Crohn's disease Father     colostomy  . Hypertension Mother   . Heart disease Mother 47  . Alcohol abuse Brother   . Cirrhosis Brother   . Crohn's disease  Sister   . Crohn's disease Paternal Aunt     x 3  . Crohn's disease      neice x 2  . Colon cancer Neg Hx   . Cancer Neg Hx   . Diabetes Neg Hx   . Rectal cancer Neg Hx   . Stomach cancer Neg Hx     Social History   Social History  . Marital status: Married    Spouse name: N/A  . Number of children: 3  . Years of education: N/A   Occupational History  . Medical Transcription Other   Social History Main Topics  . Smoking status: Former Smoker    Packs/day: 0.25    Years: 15.00    Types: Cigarettes    Quit date: 10/26/1978  . Smokeless tobacco: Never Used  . Alcohol use No  . Drug use: No  . Sexual activity: Not on file   Other Topics Concern  . 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  HENT: Negative.   Eyes: Negative for blurred vision.  Respiratory: Negative for cough, shortness of breath and wheezing.   Cardiovascular: Negative for chest pain and palpitations.  Gastrointestinal: as per HPI Genitourinary: Negative for dysuria, urgency, frequency and hematuria.  Musculoskeletal: Positive for myalgias, back pain and joint pain.  Skin: Negative for itching and rash.  Neurological: Negative for dizziness, tremors, focal weakness, seizures and loss of consciousness.  Psychiatric/Behavioral: Negative for depression, suicidal ideas and hallucinations.  All other systems reviewed and are negative.   Physical Exam: Vitals:   05/12/16 1343  BP: 138/74  Pulse: 78   Gen:      No acute distress HEENT:  EOMI, sclera anicteric Neck:     No masses; no thyromegaly Lungs:    Clear to auscultation bilaterally; normal respiratory effort CV:         Regular rate and rhythm; no murmurs Abd:        + bowel sounds; soft, non-tender; no palpable masses, no distension Ext:    No edema; adequate peripheral perfusion Skin:      Warm and dry; no rash Neuro: alert and oriented x 3 Psych: normal mood and affect  Data Reviewed:Reviewed chart in epic   Assessment and Plan/Recommendations:  65 year old female with long-standing history of Crohn's disease with evidence of possible narrowing /stricture at ileocolonic anastomosis based on last colonoscopy here for follow-up visit with complaints of excessive bloating and intermittent abdominal pain Advise patient to avoid excessive fiber Small frequent meals We'll do a trial of IB Gaurd twice daily as needed Ok to use Phazyme as  needed for excessive gas and bloating Due for surveillance colonoscopy in May 2018 Continue 6-MP 50 mg daily We'll start low-dose Linzess 72 mcg as constipation could also be playing some role with her symptoms Patient labs showed stable CBC and comprehensive metabolic panel We will recheck B12, folate, iron panel at next office visit Return in 6 months 25 minutes was spent face-to-face with the patient. Greater than 50% of the time used for counseling as well as treatment plan and follow-up. She had multiple questions which were answered to her satisfaction  K. Denzil Magnuson , MD [phone] Mon-Fri 8a-5p [phone] after 5p, weekends, holidays  CC: [name], MD

## 2016-06-30 ENCOUNTER — Other Ambulatory Visit: Payer: Self-pay

## 2016-06-30 DIAGNOSIS — E538 Deficiency of other specified B group vitamins: Secondary | ICD-10-CM

## 2016-06-30 DIAGNOSIS — K50919 Crohn's disease, unspecified, with unspecified complications: Secondary | ICD-10-CM

## 2016-06-30 DIAGNOSIS — R748 Abnormal levels of other serum enzymes: Secondary | ICD-10-CM

## 2016-07-30 ENCOUNTER — Other Ambulatory Visit (INDEPENDENT_AMBULATORY_CARE_PROVIDER_SITE_OTHER): Payer: PRIVATE HEALTH INSURANCE

## 2016-07-30 DIAGNOSIS — E538 Deficiency of other specified B group vitamins: Secondary | ICD-10-CM | POA: Diagnosis not present

## 2016-07-30 DIAGNOSIS — R748 Abnormal levels of other serum enzymes: Secondary | ICD-10-CM

## 2016-07-30 DIAGNOSIS — K50919 Crohn's disease, unspecified, with unspecified complications: Secondary | ICD-10-CM | POA: Diagnosis not present

## 2016-07-30 LAB — CBC WITH DIFFERENTIAL/PLATELET
BASOS PCT: 0.1 % (ref 0.0–3.0)
Basophils Absolute: 0 10*3/uL (ref 0.0–0.1)
EOS ABS: 0.1 10*3/uL (ref 0.0–0.7)
Eosinophils Relative: 1.4 % (ref 0.0–5.0)
HCT: 34 % — ABNORMAL LOW (ref 36.0–46.0)
HEMOGLOBIN: 11.5 g/dL — AB (ref 12.0–15.0)
Lymphocytes Relative: 41.6 % (ref 12.0–46.0)
Lymphs Abs: 2.8 10*3/uL (ref 0.7–4.0)
MCHC: 33.9 g/dL (ref 30.0–36.0)
MCV: 97.8 fl (ref 78.0–100.0)
MONO ABS: 0.5 10*3/uL (ref 0.1–1.0)
Monocytes Relative: 7.7 % (ref 3.0–12.0)
Neutro Abs: 3.3 10*3/uL (ref 1.4–7.7)
Neutrophils Relative %: 49.2 % (ref 43.0–77.0)
Platelets: 285 10*3/uL (ref 150.0–400.0)
RBC: 3.48 Mil/uL — ABNORMAL LOW (ref 3.87–5.11)
RDW: 14.6 % (ref 11.5–15.5)
WBC: 6.6 10*3/uL (ref 4.0–10.5)

## 2016-07-30 LAB — BASIC METABOLIC PANEL
BUN: 19 mg/dL (ref 6–23)
CO2: 31 mEq/L (ref 19–32)
Calcium: 9.4 mg/dL (ref 8.4–10.5)
Chloride: 102 mEq/L (ref 96–112)
Creatinine, Ser: 0.85 mg/dL (ref 0.40–1.20)
GFR: 71.13 mL/min (ref >60.00)
GLUCOSE: 103 mg/dL — AB (ref 70–99)
POTASSIUM: 4.5 meq/L (ref 3.5–5.1)
SODIUM: 139 meq/L (ref 135–145)

## 2016-07-30 LAB — HEPATIC FUNCTION PANEL
ALBUMIN: 4.1 g/dL (ref 3.5–5.2)
ALK PHOS: 85 U/L (ref 39–117)
ALT: 33 U/L (ref 0–35)
AST: 29 U/L (ref 0–37)
BILIRUBIN DIRECT: 0 mg/dL (ref 0.0–0.3)
BILIRUBIN TOTAL: 0.4 mg/dL (ref 0.2–1.2)
Total Protein: 7.5 g/dL (ref 6.0–8.3)

## 2016-07-30 LAB — FERRITIN: Ferritin: 115.1 ng/mL (ref 10.0–291.0)

## 2016-07-30 LAB — FOLATE: FOLATE: 20.3 ng/mL (ref >5.9)

## 2016-07-30 LAB — SEDIMENTATION RATE: Sed Rate: 19 mm/hr (ref 0–30)

## 2016-07-30 LAB — VITAMIN B12: VITAMIN B 12: 333 pg/mL (ref 211–911)

## 2016-07-30 LAB — C-REACTIVE PROTEIN: CRP: <0.1 mg/dL — ABNORMAL LOW (ref 0.5–20.0)

## 2016-08-04 ENCOUNTER — Other Ambulatory Visit: Payer: Self-pay | Admitting: Family Medicine

## 2016-08-04 ENCOUNTER — Ambulatory Visit
Admission: RE | Admit: 2016-08-04 | Discharge: 2016-08-04 | Disposition: A | Discharge status: Home / Self Care | Payer: PRIVATE HEALTH INSURANCE | Source: Ambulatory Visit | Attending: Family Medicine | Admitting: Family Medicine

## 2016-08-04 DIAGNOSIS — Z1231 Encounter for screening mammogram for malignant neoplasm of breast: Secondary | ICD-10-CM

## 2016-08-05 LAB — HM MAMMOGRAPHY

## 2016-08-06 ENCOUNTER — Encounter: Payer: Self-pay | Admitting: *Deleted

## 2016-08-12 ENCOUNTER — Encounter: Payer: Self-pay | Admitting: Family Medicine

## 2016-08-21 ENCOUNTER — Other Ambulatory Visit: Payer: PRIVATE HEALTH INSURANCE

## 2016-08-21 ENCOUNTER — Other Ambulatory Visit: Payer: Self-pay | Admitting: Family Medicine

## 2016-08-24 ENCOUNTER — Encounter: Payer: Self-pay | Admitting: Family Medicine

## 2016-08-24 ENCOUNTER — Ambulatory Visit (INDEPENDENT_AMBULATORY_CARE_PROVIDER_SITE_OTHER): Payer: PRIVATE HEALTH INSURANCE | Admitting: Family Medicine

## 2016-08-24 VITALS — BP 126/74 | HR 80 | Temp 98.4°F | Ht 60.0 in | Wt 116.8 lb

## 2016-08-24 DIAGNOSIS — I10 Essential (primary) hypertension: Secondary | ICD-10-CM

## 2016-08-24 DIAGNOSIS — Z0000 Encounter for general adult medical examination without abnormal findings: Secondary | ICD-10-CM

## 2016-08-24 DIAGNOSIS — F418 Other specified anxiety disorders: Secondary | ICD-10-CM

## 2016-08-24 DIAGNOSIS — D509 Iron deficiency anemia, unspecified: Secondary | ICD-10-CM | POA: Diagnosis not present

## 2016-08-24 DIAGNOSIS — F329 Major depressive disorder, single episode, unspecified: Secondary | ICD-10-CM

## 2016-08-24 DIAGNOSIS — Z2911 Encounter for prophylactic immunotherapy for respiratory syncytial virus (RSV): Secondary | ICD-10-CM | POA: Diagnosis not present

## 2016-08-24 DIAGNOSIS — Z23 Encounter for immunization: Secondary | ICD-10-CM | POA: Diagnosis not present

## 2016-08-24 DIAGNOSIS — F419 Anxiety disorder, unspecified: Secondary | ICD-10-CM

## 2016-08-24 DIAGNOSIS — K5010 Crohn's disease of large intestine without complications: Secondary | ICD-10-CM | POA: Diagnosis not present

## 2016-08-24 NOTE — Progress Notes (Signed)
BP 126/74   Pulse 80   Temp 98.4 F (36.9 C) (Oral)   Ht 5' (1.524 m)   Wt 116 lb 12 oz (53 kg)   BMI 22.80 kg/m   CC: CPE Subjective:    Patient ID: Michelle Vasquez, female    DOB: [birth year], 65 y.o.   MRN: [id number]  HPI: Michelle Vasquez is a 65 y.o. female presenting on 08/24/2016 for Annual Exam   Planning on retiring 11/2016 but wants to return to work part time.  Receives longterm B12 injections monthly.   Preventative: COLONOSCOPY Date: 02/2015 benign biopsies, crohn's with stenotic stricture at anastomosis Olevia Perches) Well woman - s/p total hysterectomy for fibroids, ovaries removed at age 58yo. Took hormone replacement only for a few months. DEXA 2012 - T-1.0 Mammogram 07/2016 WNL. Breast exams at home. Flu shot yearly at work. Tdap 2015 Pneumovax 2014. Prevnar will return zostavax - Ok by GI. Provided today Completed Hep B series.  PPD at work yearly. Seat belt use discussed.  Sunscreen use discussed. No suspicious moles. 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 Interim medical history since our last visit reviewed. Allergies and medications reviewed and updated. Current Outpatient Prescriptions on File Prior to Visit  Medication Sig  . amLODipine (NORVASC) 5 MG tablet TAKE 1 TABLET BY MOUTH DAILY  . cetirizine (ZYRTEC) 10 MG tablet Take 10 mg by mouth at bedtime.  . cyanocobalamin (,VITAMIN B-12,) 1000 MCG/ML injection Inject 1000 mcg/ml IM monthly x 6 months. Please, dispense 3 cc syringes and 25 G x 1 inch needles also.  . ferrous sulfate (FERROUSUL) 325 (65 FE) MG tablet Take 1 tablet (325 mg total) by mouth 3 (three) times daily with meals. (Patient  taking differently: Take 325 mg by mouth daily with breakfast. )  . folic acid (FOLVITE) 1 MG tablet Take 1 tablet (1 mg total) by mouth daily.  Marland Kitchen guaiFENesin (MUCINEX) 600 MG 12 hr tablet Take 1 tablet (600 mg total) by mouth 2 (two) times daily.  Marland Kitchen linaclotide (LINZESS) 72 MCG capsule Take 1 capsule (72 mcg total) by mouth daily before breakfast.  . mercaptopurine (PURINETHOL) 50 MG tablet Take 1 tablet (50 mg total) by mouth daily. Give on an empty stomach 1 hour before or 2 hours after meals. Caution: Chemotherapy.  Marland Kitchen omeprazole (PRILOSEC) 40 MG capsule Take 1 capsule (40 mg total) by mouth daily.  . sertraline (ZOLOFT) 100 MG tablet TAKE 1 TABLET BY MOUTH DAILY   No current facility-administered medications on file prior to visit.     Review of Systems  Constitutional: Negative for activity change, appetite change, chills, fatigue, fever and unexpected weight change.  HENT: Negative for hearing loss.   Eyes: Negative for visual disturbance.  Respiratory: Negative for cough, chest tightness, shortness of breath and wheezing.   Cardiovascular: Negative for chest pain, palpitations and leg swelling.  Gastrointestinal: Positive for abdominal pain (crohn's related) and constipation. Negative for abdominal distention, blood in stool, diarrhea, nausea and vomiting.  Genitourinary: Negative for difficulty urinating and hematuria.  Musculoskeletal: Negative for arthralgias, myalgias and neck pain.  Skin: Negative for rash.  Neurological: Negative for dizziness, seizures, syncope and headaches.  Hematological: Negative for adenopathy. Does not bruise/bleed  easily.  Psychiatric/Behavioral: Negative for dysphoric mood. The patient is not nervous/anxious.    Per HPI unless specifically indicated in ROS section     Objective:    BP 126/74   Pulse 80   Temp 98.4 F (36.9 C) (Oral)   Ht 5' (1.524 m)   Wt 116 lb 12 oz (53 kg)   BMI 22.80 kg/m   Wt Readings from Last 3 Encounters:  08/24/16  116 lb 12 oz (53 kg)  05/12/16 116 lb (52.6 kg)  12/18/15 112 lb 3.2 oz (50.9 kg)    Physical Exam  Constitutional: She is oriented to person, place, and time. She appears well-developed and well-nourished. No distress.  HENT:  Head: Normocephalic and atraumatic.  Right Ear: Hearing, tympanic membrane, external ear and ear canal normal.  Left Ear: Hearing, tympanic membrane, external ear and ear canal normal.  Nose: Nose normal.  Mouth/Throat: Uvula is midline, oropharynx is clear and moist and mucous membranes are normal. No oropharyngeal exudate, posterior oropharyngeal edema or posterior oropharyngeal erythema.  Eyes: Conjunctivae and EOM are normal. Pupils are equal, round, and reactive to light. No scleral icterus.  Neck: Normal range of motion. Neck supple. No thyromegaly present.  Cardiovascular: Normal rate, regular rhythm, normal heart sounds and intact distal pulses.   No murmur heard. Pulses:      Radial pulses are 2+ on the right side, and 2+ on the left side.  Pulmonary/Chest: Effort normal and breath sounds normal. No respiratory distress. She has no wheezes. She has no rales.  Abdominal: Soft. Bowel sounds are normal. She exhibits no distension and no mass. There is no tenderness. There is no rebound and no guarding.  Musculoskeletal: Normal range of motion. She exhibits no edema.  Lymphadenopathy:    She has no cervical adenopathy.  Neurological: She is alert and oriented to person, place, and time.  CN grossly intact, station and gait intact  Skin: Skin is warm and dry. No rash noted.  Psychiatric: She has a normal mood and affect. Her behavior is normal. Judgment and thought content normal.  Nursing note and vitals reviewed.  Results for orders placed or performed in visit on 08/06/16  [facility] MAMMOGRAPHY  Result Value Ref Range   HM Mammogram 0-4 Bi-Rad 0-4 Bi-Rad, Self Reported Normal      Assessment & Plan:   Problem List Items Addressed This Visit    Anxiety and  depression    Stable on zoloft 176m daily. Will continue. Consider tapering once retires.       Crohn's disease ([place]    Appreciate GI care. Continue mercaptopurine.      Essential hypertension    Chronic, stable. Continue amlodipine 530mdaily.      Healthcare maintenance - Primary    Preventative protocols reviewed and updated unless pt declined. Discussed healthy diet and lifestyle.       Iron deficiency anemia    Pt takes iron tablet daily.       Other Visit Diagnoses   None.      Follow up plan: Return in about 1 year (around 08/24/2017), or as needed, for medicare wellness visit.  JaRia BushMD

## 2016-08-24 NOTE — Progress Notes (Signed)
Pre visit review using our clinic review tool, if applicable. No additional management support is needed unless otherwise documented below in the visit note. 

## 2016-08-24 NOTE — Assessment & Plan Note (Signed)
Stable on zoloft 126m daily. Will continue. Consider tapering once retires.

## 2016-08-24 NOTE — Patient Instructions (Addendum)
Shingles shot today Return at your convenience after 1 month for prevnar.  Return as needed or in 1 year for medicare wellness visit.  You are doing well today  Health Maintenance, Female Adopting a healthy lifestyle and getting preventive care can go a long way to promote health and wellness. Talk with your health care provider about what schedule of regular examinations is right for you. This is a good chance for you to check in with your provider about disease prevention and staying healthy. In between checkups, there are plenty of things you can do on your own. Experts have done a lot of research about which lifestyle changes and preventive measures are most likely to keep you healthy. Ask your health care provider for more information. WEIGHT AND DIET  Eat a healthy diet  Be sure to include plenty of vegetables, fruits, low-fat dairy products, and lean protein.  Do not eat a lot of foods high in solid fats, added sugars, or salt.  Get regular exercise. This is one of the most important things you can do for your health.  Most adults should exercise for at least 150 minutes each week. The exercise should increase your heart rate and make you sweat (moderate-intensity exercise).  Most adults should also do strengthening exercises at least twice a week. This is in addition to the moderate-intensity exercise.  Maintain a healthy weight  Body mass index (BMI) is a measurement that can be used to identify possible weight problems. It estimates body fat based on height and weight. Your health care provider can help determine your BMI and help you achieve or maintain a healthy weight.  For females 52 years of age and older:   A BMI below 18.5 is considered underweight.  A BMI of 18.5 to 24.9 is normal.  A BMI of 25 to 29.9 is considered overweight.  A BMI of 30 and above is considered obese.  Watch levels of cholesterol and blood lipids  You should start having your blood tested  for lipids and cholesterol at 66 years of age, then have this test every 5 years.  You may need to have your cholesterol levels checked more often if:  Your lipid or cholesterol levels are high.  You are older than 66 years of age.  You are at high risk for heart disease.  CANCER SCREENING   Lung Cancer  Lung cancer screening is recommended for adults 3-61 years old who are at high risk for lung cancer because of a history of smoking.  A yearly low-dose CT scan of the lungs is recommended for people who:  Currently smoke.  Have quit within the past 15 years.  Have at least a 30-pack-year history of smoking. A pack year is smoking an average of one pack of cigarettes a day for 1 year.  Yearly screening should continue until it has been 15 years since you quit.  Yearly screening should stop if you develop a health problem that would prevent you from having lung cancer treatment.  Breast Cancer  Practice breast self-awareness. This means understanding how your breasts normally appear and feel.  It also means doing regular breast self-exams. Let your health care provider know about any changes, no matter how small.  If you are in your 20s or 30s, you should have a clinical breast exam (CBE) by a health care provider every 1-3 years as part of a regular health exam.  If you are 57 or older, have a CBE every year.  Also consider having a breast X-ray (mammogram) every year.  If you have a family history of breast cancer, talk to your health care provider about genetic screening.  If you are at high risk for breast cancer, talk to your health care provider about having an MRI and a mammogram every year.  Breast cancer gene (BRCA) assessment is recommended for women who have family members with BRCA-related cancers. BRCA-related cancers include:  Breast.  Ovarian.  Tubal.  Peritoneal cancers.  Results of the assessment will determine the need for genetic counseling and  BRCA1 and BRCA2 testing. Cervical Cancer Your health care provider may recommend that you be screened regularly for cancer of the pelvic organs (ovaries, uterus, and vagina). This screening involves a pelvic examination, including checking for microscopic changes to the surface of your cervix (Pap test). You may be encouraged to have this screening done every 3 years, beginning at age 24.  For women ages 28-65, health care providers may recommend pelvic exams and Pap testing every 3 years, or they may recommend the Pap and pelvic exam, combined with testing for human papilloma virus (HPV), every 5 years. Some types of HPV increase your risk of cervical cancer. Testing for HPV may also be done on women of any age with unclear Pap test results.  Other health care providers may not recommend any screening for nonpregnant women who are considered low risk for pelvic cancer and who do not have symptoms. Ask your health care provider if a screening pelvic exam is right for you.  If you have had past treatment for cervical cancer or a condition that could lead to cancer, you need Pap tests and screening for cancer for at least 20 years after your treatment. If Pap tests have been discontinued, your risk factors (such as having a new sexual partner) need to be reassessed to determine if screening should resume. Some women have medical problems that increase the chance of getting cervical cancer. In these cases, your health care provider may recommend more frequent screening and Pap tests. Colorectal Cancer  This type of cancer can be detected and often prevented.  Routine colorectal cancer screening usually begins at 66 years of age and continues through 66 years of age.  Your health care provider may recommend screening at an earlier age if you have risk factors for colon cancer.  Your health care provider may also recommend using home test kits to check for hidden blood in the stool.  A small camera at  the end of a tube can be used to examine your colon directly (sigmoidoscopy or colonoscopy). This is done to check for the earliest forms of colorectal cancer.  Routine screening usually begins at age 22.  Direct examination of the colon should be repeated every 5-10 years through 66 years of age. However, you may need to be screened more often if early forms of precancerous polyps or small growths are found. Skin Cancer  Check your skin from head to toe regularly.  Tell your health care provider about any new moles or changes in moles, especially if there is a change in a mole's shape or color.  Also tell your health care provider if you have a mole that is larger than the size of a pencil eraser.  Always use sunscreen. Apply sunscreen liberally and repeatedly throughout the day.  Protect yourself by wearing long sleeves, pants, a wide-brimmed hat, and sunglasses whenever you are outside. HEART DISEASE, DIABETES, AND HIGH BLOOD PRESSURE  High blood pressure causes heart disease and increases the risk of stroke. High blood pressure is more likely to develop in:  People who have blood pressure in the high end of the normal range (130-139/85-89 mm Hg).  People who are overweight or obese.  People who are African American.  If you are 18-39 years of age, have your blood pressure checked every 3-5 years. If you are 40 years of age or older, have your blood pressure checked every year. You should have your blood pressure measured twice--once when you are at a hospital or clinic, and once when you are not at a hospital or clinic. Record the average of the two measurements. To check your blood pressure when you are not at a hospital or clinic, you can use:  An automated blood pressure machine at a pharmacy.  A home blood pressure monitor.  If you are between 55 years and 79 years old, ask your health care provider if you should take aspirin to prevent strokes.  Have regular diabetes  screenings. This involves taking a blood sample to check your fasting blood sugar level.  If you are at a normal weight and have a low risk for diabetes, have this test once every three years after 66 years of age.  If you are overweight and have a high risk for diabetes, consider being tested at a younger age or more often. PREVENTING INFECTION  Hepatitis B  If you have a higher risk for hepatitis B, you should be screened for this virus. You are considered at high risk for hepatitis B if:  You were born in a country where hepatitis B is common. Ask your health care provider which countries are considered high risk.  Your parents were born in a high-risk country, and you have not been immunized against hepatitis B (hepatitis B vaccine).  You have HIV or AIDS.  You use needles to inject street drugs.  You live with someone who has hepatitis B.  You have had sex with someone who has hepatitis B.  You get hemodialysis treatment.  You take certain medicines for conditions, including cancer, organ transplantation, and autoimmune conditions. Hepatitis C  Blood testing is recommended for:  Everyone born from 1945 through 1965.  Anyone with known risk factors for hepatitis C. Sexually transmitted infections (STIs)  You should be screened for sexually transmitted infections (STIs) including gonorrhea and chlamydia if:  You are sexually active and are younger than 66 years of age.  You are older than 66 years of age and your health care provider tells you that you are at risk for this type of infection.  Your sexual activity has changed since you were last screened and you are at an increased risk for chlamydia or gonorrhea. Ask your health care provider if you are at risk.  If you do not have HIV, but are at risk, it may be recommended that you take a prescription medicine daily to prevent HIV infection. This is called pre-exposure prophylaxis (PrEP). You are considered at risk  if:  You are sexually active and do not regularly use condoms or know the HIV status of your partner(s).  You take drugs by injection.  You are sexually active with a partner who has HIV. Talk with your health care provider about whether you are at high risk of being infected with HIV. If you choose to begin PrEP, you should first be tested for HIV. You should then be tested every 3 months for as   long as you are taking PrEP.  PREGNANCY   If you are premenopausal and you may become pregnant, ask your health care provider about preconception counseling.  If you may become pregnant, take 400 to 800 micrograms (mcg) of folic acid every day.  If you want to prevent pregnancy, talk to your health care provider about birth control (contraception). OSTEOPOROSIS AND MENOPAUSE   Osteoporosis is a disease in which the bones lose minerals and strength with aging. This can result in serious bone fractures. Your risk for osteoporosis can be identified using a bone density scan.  If you are 30 years of age or older, or if you are at risk for osteoporosis and fractures, ask your health care provider if you should be screened.  Ask your health care provider whether you should take a calcium or vitamin D supplement to lower your risk for osteoporosis.  Menopause may have certain physical symptoms and risks.  Hormone replacement therapy may reduce some of these symptoms and risks. Talk to your health care provider about whether hormone replacement therapy is right for you.  HOME CARE INSTRUCTIONS   Schedule regular health, dental, and eye exams.  Stay current with your immunizations.   Do not use any tobacco products including cigarettes, chewing tobacco, or electronic cigarettes.  If you are pregnant, do not drink alcohol.  If you are breastfeeding, limit how much and how often you drink alcohol.  Limit alcohol intake to no more than 1 drink per day for nonpregnant women. One drink equals 12  ounces of beer, 5 ounces of wine, or 1 ounces of hard liquor.  Do not use street drugs.  Do not share needles.  Ask your health care provider for help if you need support or information about quitting drugs.  Tell your health care provider if you often feel depressed.  Tell your health care provider if you have ever been abused or do not feel safe at home.   This information is not intended to replace advice given to you by your health care provider. Make sure you discuss any questions you have with your health care provider.   Document Released: 04/27/2011 Document Revised: 11/02/2014 Document Reviewed: 09/13/2013 Elsevier Interactive Patient Education [employer].

## 2016-08-24 NOTE — Addendum Note (Signed)
Addended by: Royann Shivers A on: 08/24/2016 09:32 AM   Modules accepted: Orders

## 2016-08-24 NOTE — Assessment & Plan Note (Signed)
Preventative protocols reviewed and updated unless pt declined. Discussed healthy diet and lifestyle.  

## 2016-08-24 NOTE — Assessment & Plan Note (Signed)
Pt takes iron tablet daily.

## 2016-08-24 NOTE — Assessment & Plan Note (Signed)
Chronic, stable. Continue amlodipine 49m daily.

## 2016-08-24 NOTE — Assessment & Plan Note (Addendum)
Appreciate GI care. Continue mercaptopurine.

## 2016-08-27 ENCOUNTER — Other Ambulatory Visit: Payer: Self-pay | Admitting: Family Medicine

## 2016-09-24 ENCOUNTER — Other Ambulatory Visit: Payer: Self-pay | Admitting: Gastroenterology

## 2016-10-07 ENCOUNTER — Other Ambulatory Visit: Payer: Self-pay | Admitting: Gastroenterology

## 2016-10-07 NOTE — Telephone Encounter (Signed)
Dr Silverio Decamp, patient needs a refill of B12. Is it ok to refill? Looked like B12 was on low end on her labs but WNL.

## 2016-11-24 ENCOUNTER — Other Ambulatory Visit: Payer: Self-pay | Admitting: Family Medicine

## 2016-12-29 ENCOUNTER — Other Ambulatory Visit: Payer: Self-pay | Admitting: Gastroenterology

## 2016-12-29 NOTE — Telephone Encounter (Signed)
Patient needs to schedule a followup appointment.

## 2017-01-13 ENCOUNTER — Encounter: Payer: Self-pay | Admitting: Family Medicine

## 2017-01-13 ENCOUNTER — Encounter: Payer: Self-pay | Admitting: Internal Medicine

## 2017-01-13 ENCOUNTER — Ambulatory Visit (INDEPENDENT_AMBULATORY_CARE_PROVIDER_SITE_OTHER): Payer: PRIVATE HEALTH INSURANCE | Admitting: Internal Medicine

## 2017-01-13 VITALS — BP 124/76 | HR 76 | Temp 98.2°F | Wt 118.0 lb

## 2017-01-13 DIAGNOSIS — J0100 Acute maxillary sinusitis, unspecified: Secondary | ICD-10-CM | POA: Diagnosis not present

## 2017-01-13 MED ORDER — AMOXICILLIN 500 MG PO TABS: 1000.0000 mg | ORAL_TABLET | Freq: Two times a day (BID) | ORAL | 0 refills | Status: AC

## 2017-01-13 NOTE — Telephone Encounter (Signed)
Plz schedule monthly B12 injections. Thanks.

## 2017-01-13 NOTE — Progress Notes (Signed)
Subjective:    Patient ID: Michelle Vasquez, female    DOB: [birth year], 66 y.o.   MRN: [id number]  HPI Here due to respiratory illness  Started with sinus infection about 6-7weeks ago Also had cold sore Seemed better and cold sore resolved Relapsed a few weeks ago Headache, nasal congestion, eye drainage Nasal saline, zyrtec and mucinex---did improve briefly  Now bad again-- 4-5 days Headache, nasal congestion, nausea Not much cough--just "strangles" from drainage Awoke last night with everything spinning (and nausea). Last 10-15 minutes and hasn't recurred Nosebleed recently Has also used tylenol  Current Outpatient Prescriptions on File Prior to Visit  Medication Sig Dispense Refill  . amLODipine (NORVASC) 5 MG tablet TAKE 1 TABLET BY MOUTH DAILY 30 tablet 11  . cetirizine (ZYRTEC) 10 MG tablet Take 10 mg by mouth at bedtime.    . cyanocobalamin (,VITAMIN B-12,) 1000 MCG/ML injection INJECT 1ML ONCE A MONTH FOR 6 MONTHS. 1 mL 12  . ferrous sulfate (FERROUSUL) 325 (65 FE) MG tablet Take 1 tablet (325 mg total) by mouth 3 (three) times daily with meals. (Patient taking differently: Take 325 mg by mouth daily with breakfast. ) 90 tablet 3  . folic acid (FOLVITE) 1 MG tablet TAKE 1 TABLET BY MOUTH DAILY 30 tablet 1  . guaiFENesin (MUCINEX) 600 MG 12 hr tablet Take 1 tablet (600 mg total) by mouth 2 (two) times daily. 60 tablet 3  . linaclotide (LINZESS) 72 MCG capsule Take 1 capsule (72 mcg total) by mouth daily before breakfast. 30 capsule 3  . mercaptopurine (PURINETHOL) 50 MG tablet TAKE 1 TABLET BY MOUTH DAILY 1 HOUR BEFORE OR 2 HOURS AFTER MEALS ON AN EMPTY STOMACH. 30 tablet 1  . omeprazole (PRILOSEC) 40 MG capsule Take 1 capsule (40 mg total) by mouth daily. 30 capsule 11  . sertraline (ZOLOFT) 100 MG tablet TAKE 1 TABLET BY MOUTH DAILY 30 tablet 6   No current facility-administered medications on file prior to visit.     Allergies  Allergen Reactions  . Celexa  [Citalopram Hydrobromide] Other (See Comments)    Racing thoughts, couldn't sleep    Past Medical History:  Diagnosis Date  . Allergy    seasonal  . Anxiety and depression 07/03/2011  . B12 deficiency   . Blood transfusion without reported diagnosis   . Central hearing loss   . Crohn disease ([place])    s/p colectomy on 6MP, ?bacterial overgrowth  . Fatty liver 12/15/11  . History of small bowel obstruction   . HTN (hypertension)   . Internal hemorrhoids without mention of complication   . Osteomalacia, unspecified   . Perennial allergic rhinitis with seasonal variation   . Unspecified deficiency anemia   . Unspecified polyarthropathy or polyarthritis, multiple sites     Past Surgical History:  Procedure Laterality Date  . APPENDECTOMY    . COLONOSCOPY  02/25/05   Internal hemms; crohns; narrow anast  . COLONOSCOPY  02/26/10   Prior right hemicolectomy o/w benign (Dr. Olevia Perches)  . COLONOSCOPY  02/2015   benign biopsies, crohn's with stenotic stricture at anastomosis Olevia Perches)  . DEXA  07/06/2011   normal T score -1.0 femur, spine  . ESOPHAGOGASTRODUODENOSCOPY  02/25/05   Dilated stricture  . HEMICOLECTOMY  1980   all small intestine, none large  . OTHER SURGICAL HISTORY  1960s, 1978, 1980   ileal resection x 3 and subtotal colon resection  . TOTAL ABDOMINAL HYSTERECTOMY  1975   ovaries out as well  .  UPPER GASTROINTESTINAL ENDOSCOPY      Family History  Problem Relation Age of Onset  . Crohn's disease Father     colostomy  . Hypertension Mother   . Heart disease Mother 54  . Alcohol abuse Brother   . Cirrhosis Brother   . Crohn's disease Sister   . Crohn's disease Paternal Aunt     x 3  . Crohn's disease      neice x 2  . Colon cancer Neg Hx   . Cancer Neg Hx   . Diabetes Neg Hx   . Rectal cancer Neg Hx   . Stomach cancer Neg Hx     Social History   Social History  . Marital status: Married    Spouse name: N/A  . Number of children: 3  . Years of education: N/A     Occupational History  . Medical Transcription Other   Social History Main Topics  . Smoking status: Former Smoker    Packs/day: 0.25    Years: 15.00    Types: Cigarettes    Quit date: 10/26/1978  . Smokeless tobacco: Never Used  . Alcohol use No  . Drug use: No  . Sexual activity: Not on file   Other Topics Concern  . Not on file   Social History Narrative   Caffeine: 2 cups coffee/day   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, no regular activity   Diet: good water, fruits/vegetables occasionally   Review of Systems No ear pain--but chronic tinnitus No rash No vomiting Intermittent diarrhea with Crohn's--some worse with illness Appetite is okay    Objective:   Physical Exam  Constitutional: She appears well-nourished. No distress.  HENT:  Mouth/Throat: Oropharynx is clear and moist. No oropharyngeal exudate.  Moderate maxillary tenderness TMs normal Moderate nasal inflammation   Neck: No thyromegaly present.  Pulmonary/Chest: Effort normal and breath sounds normal. No respiratory distress. She has no wheezes. She has no rales.  Lymphadenopathy:    She has no cervical adenopathy.          Assessment & Plan:

## 2017-01-13 NOTE — Progress Notes (Signed)
Pre visit review using our clinic review tool, if applicable. No additional management support is needed unless otherwise documented below in the visit note. 

## 2017-01-13 NOTE — Assessment & Plan Note (Signed)
Waxing and waning for quite some time Worth trying antibiotic at this point Will give amoxil Discussed supportive care

## 2017-01-14 NOTE — Telephone Encounter (Signed)
See Mychart message. Pt wants to start getting B12 injections here, but you didn't start her on them. Would you be ok managing them?

## 2017-01-15 NOTE — Telephone Encounter (Signed)
See my comment from 3/21 - ok to start in office.

## 2017-01-19 ENCOUNTER — Ambulatory Visit: Payer: Medicare Other

## 2017-01-19 ENCOUNTER — Ambulatory Visit (INDEPENDENT_AMBULATORY_CARE_PROVIDER_SITE_OTHER): Payer: PRIVATE HEALTH INSURANCE | Admitting: *Deleted

## 2017-01-19 DIAGNOSIS — E538 Deficiency of other specified B group vitamins: Secondary | ICD-10-CM | POA: Diagnosis not present

## 2017-01-19 MED ORDER — CYANOCOBALAMIN 1000 MCG/ML IJ SOLN
1000.0000 ug | INTRAMUSCULAR | Status: DC
  Administered 2017-01-19 – 2021-01-15 (×2): 1000 ug via INTRAMUSCULAR

## 2017-03-10 ENCOUNTER — Other Ambulatory Visit: Payer: Self-pay | Admitting: Gastroenterology

## 2017-03-12 MED ORDER — MERCAPTOPURINE 50 MG PO TABS: ORAL_TABLET | ORAL | 1 refills | Status: DC

## 2017-03-15 ENCOUNTER — Telehealth: Payer: Self-pay | Admitting: Gastroenterology

## 2017-03-15 MED ORDER — MERCAPTOPURINE 50 MG PO TABS: ORAL_TABLET | ORAL | 2 refills | Status: DC

## 2017-03-15 MED ORDER — FOLIC ACID 1 MG PO TABS: 1.0000 mg | ORAL_TABLET | Freq: Every day | ORAL | 2 refills | Status: DC

## 2017-03-15 NOTE — Telephone Encounter (Signed)
Medications sent to [place]

## 2017-05-04 ENCOUNTER — Ambulatory Visit: Payer: PRIVATE HEALTH INSURANCE | Admitting: Gastroenterology

## 2017-05-04 ENCOUNTER — Encounter: Payer: Self-pay | Admitting: Gastroenterology

## 2017-05-19 ENCOUNTER — Other Ambulatory Visit: Payer: Self-pay

## 2017-05-19 MED ORDER — MERCAPTOPURINE 50 MG PO TABS: ORAL_TABLET | ORAL | 2 refills | Status: DC

## 2017-06-14 ENCOUNTER — Encounter: Payer: Self-pay | Admitting: Family Medicine

## 2017-06-14 ENCOUNTER — Telehealth: Payer: Self-pay | Admitting: Family Medicine

## 2017-06-14 ENCOUNTER — Ambulatory Visit (INDEPENDENT_AMBULATORY_CARE_PROVIDER_SITE_OTHER): Payer: Medicare Other | Admitting: Family Medicine

## 2017-06-14 DIAGNOSIS — W57XXXA Bitten or stung by nonvenomous insect and other nonvenomous arthropods, initial encounter: Secondary | ICD-10-CM | POA: Insufficient documentation

## 2017-06-14 DIAGNOSIS — S80862A Insect bite (nonvenomous), left lower leg, initial encounter: Secondary | ICD-10-CM

## 2017-06-14 DIAGNOSIS — S80861A Insect bite (nonvenomous), right lower leg, initial encounter: Secondary | ICD-10-CM | POA: Diagnosis not present

## 2017-06-14 NOTE — Assessment & Plan Note (Signed)
New- she shows me the insects which are not ticks, they look like chiggers. No signs of infection on exam. Advised using OTC cortisone as needed for itching and inflammation along with insect repellent and long pants in her yard. Call or return to clinic prn if these symptoms worsen or fail to improve as anticipated. The patient indicates understanding of these issues and agrees with the plan.

## 2017-06-14 NOTE — Telephone Encounter (Signed)
Pt has appt with Dr Deborra Medina 06/14/17 at 11:30

## 2017-06-14 NOTE — Progress Notes (Signed)
Subjective:   Patient ID: Michelle Vasquez, female    DOB: 07/04/1951, 66 y.o.   MRN: [id number]  Michelle Vasquez is a pleasant 66 y.o. year old female pt of Dr. [name], new to me,  who presents to clinic today with Insect Bite (multiple places on body but maily legs )  on 06/14/2017  HPI:  Multiple but bites on legs- very itchy.  Was walking through her yard when this happens.  They were crawling all over her legs and her daughter put some of the insects in a bag for me to see.  She has had no fevers, chills, myalgias, rashes, HA, nausea, vomiting or photophobia.  Current Outpatient Prescriptions on File Prior to Visit  Medication Sig Dispense Refill  . amLODipine (NORVASC) 5 MG tablet TAKE 1 TABLET BY MOUTH DAILY 30 tablet 11  . cetirizine (ZYRTEC) 10 MG tablet Take 10 mg by mouth at bedtime.    . cyanocobalamin (,VITAMIN B-12,) 1000 MCG/ML injection INJECT 1ML ONCE A MONTH FOR 6 MONTHS. 1 mL 12  . folic acid (FOLVITE) 1 MG tablet Take 1 tablet (1 mg total) by mouth daily. 30 tablet 2  . guaiFENesin (MUCINEX) 600 MG 12 hr tablet Take 1 tablet (600 mg total) by mouth 2 (two) times daily. 60 tablet 3  . linaclotide (LINZESS) 72 MCG capsule Take 1 capsule (72 mcg total) by mouth daily before breakfast. 30 capsule 3  . mercaptopurine (PURINETHOL) 50 MG tablet TAKE 1 TABLET BY MOUTH DAILY 1 HOUR BEFORE OR 2 HOURS AFTER MEALS ON AN EMPTY STOMACH. 30 tablet 2  . sertraline (ZOLOFT) 100 MG tablet TAKE 1 TABLET BY MOUTH DAILY 30 tablet 6   Current Facility-Administered Medications on File Prior to Visit  Medication Dose Route Frequency Provider Last Rate Last Dose  . cyanocobalamin ((VITAMIN B-12)) injection 1,000 mcg  1,000 mcg Intramuscular Q30 days [name], MD   1,000 mcg at 01/19/17 1524    Allergies  Allergen Reactions  . Celexa [Citalopram Hydrobromide] Other (See Comments)    Racing thoughts, couldn't sleep    Past Medical History:  Diagnosis Date  . Allergy    seasonal    . Anxiety and depression 07/03/2011  . B12 deficiency   . Blood transfusion without reported diagnosis   . Central hearing loss   . Crohn disease ([place])    s/p colectomy on 6MP, ?bacterial overgrowth  . Fatty liver 12/15/11  . History of small bowel obstruction   . HTN (hypertension)   . Internal hemorrhoids without mention of complication   . Osteomalacia, unspecified   . Perennial allergic rhinitis with seasonal variation   . Unspecified deficiency anemia   . Unspecified polyarthropathy or polyarthritis, multiple sites     Past Surgical History:  Procedure Laterality Date  . APPENDECTOMY    . COLONOSCOPY  02/25/05   Internal hemms; crohns; narrow anast  . COLONOSCOPY  02/26/10   Prior right hemicolectomy o/w benign (Dr. Olevia Perches)  . COLONOSCOPY  02/2015   benign biopsies, crohn's with stenotic stricture at anastomosis Olevia Perches)  . DEXA  07/06/2011   normal T score -1.0 femur, spine  . ESOPHAGOGASTRODUODENOSCOPY  02/25/05   Dilated stricture  . HEMICOLECTOMY  1980   all small intestine, none large  . OTHER SURGICAL HISTORY  1960s, 1978, 1980   ileal resection x 3 and subtotal colon resection  . TOTAL ABDOMINAL HYSTERECTOMY  1975   ovaries out as well  . UPPER GASTROINTESTINAL ENDOSCOPY  Family History  Problem Relation Age of Onset  . Crohn's disease Father        colostomy  . Hypertension Mother   . Heart disease Mother 25  . Alcohol abuse Brother   . Cirrhosis Brother   . Crohn's disease Sister   . Crohn's disease Paternal Aunt        x 3  . Crohn's disease Unknown        neice x 2  . Colon cancer Neg Hx   . Cancer Neg Hx   . Diabetes Neg Hx   . Rectal cancer Neg Hx   . Stomach cancer Neg Hx     Social History   Social History  . Marital status: Married    Spouse name: N/A  . Number of children: 3  . Years of education: N/A   Occupational History  . Medical Transcription Other   Social History Main Topics  . Smoking status: Former Smoker    Packs/day:  0.25    Years: 15.00    Types: Cigarettes    Quit date: 10/26/1978  . Smokeless tobacco: Never Used  . Alcohol use No  . Drug use: No  . Sexual activity: Not on file   Other Topics Concern  . 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   Review of Systems  Constitutional: Negative.   HENT: Negative.   Eyes: Negative.   Respiratory: Negative.   Cardiovascular: Negative.   Gastrointestinal: Negative.   Musculoskeletal: Negative.   Neurological: Negative.   Psychiatric/Behavioral: Negative.   All other systems reviewed and are negative.      Objective:    BP 120/76   Pulse 80   Temp 98.3 F (36.8 C) (Oral)   Resp 16   Ht 5' (1.524 m)   Wt 115 lb 6.4 oz (52.3 kg)   SpO2 98%   BMI 22.54 kg/m    Physical Exam  Constitutional: She is oriented to person, place, and time. She appears well-developed and well-nourished. No distress.  HENT:  Head: Normocephalic and atraumatic.  Eyes: Conjunctivae are normal.  Cardiovascular: Normal rate.   Pulmonary/Chest: Effort normal.  Musculoskeletal: Normal range of motion.  Neurological: She is alert and oriented to person, place, and time. No cranial nerve deficit.  Skin: She is not diaphoretic.     Psychiatric: She has a normal mood and affect. Her behavior is normal. Judgment and thought content normal.  Nursing note and vitals reviewed.         Assessment & Plan:   Insect bite, initial encounter No Follow-up on file.

## 2017-06-14 NOTE — Telephone Encounter (Signed)
Patient Name: Michelle Vasquez  DOB: 1951/04/09    Initial Comment Caller states c/o multiple tick bites.   Nurse Assessment  Nurse: Raphael Gibney, RN, Vanita Ingles Date/Time (Eastern Time): 06/14/2017 8:46:16 AM  Confirm and document reason for call. If symptomatic, describe symptoms. ---Caller states she has multiple tick bites on her legs. She had about 30 tick bites on Wednesday. Ticks were very small. Tick bites are healing. She has the ticks saved in a bag. This is the 3rd time she has had multiple tick bites. No appetite. Has some numbness in her hands and feet. she has a rash from the tick bites.  Does the patient have any new or worsening symptoms? ---Yes  Will a triage be completed? ---Yes  Related visit to physician within the last 2 weeks? ---No  Does the PT have any chronic conditions? (i.e. diabetes, asthma, etc.) ---Yes  List chronic conditions. ---HTN; crohns disease  Is this a behavioral health or substance abuse call? ---No     Guidelines    Guideline Title Affirmed Question Affirmed Notes  Tick Bite [1] 2 to 14 days following tick bite AND [2] widespread rash or headache AND [3] no fever    Final Disposition User   See Physician within 24 Hours [place], RN, Vera    Comments  appt scheduled for 06/14/2017 at 11:30 am with Dr. Arnette Norris   Referrals  REFERRED TO PCP OFFICE   Disagree/Comply: Comply

## 2017-06-26 ENCOUNTER — Other Ambulatory Visit: Payer: Self-pay | Admitting: Gastroenterology

## 2017-07-02 ENCOUNTER — Other Ambulatory Visit: Payer: Self-pay | Admitting: Gastroenterology

## 2017-07-02 ENCOUNTER — Other Ambulatory Visit: Payer: Self-pay | Admitting: Family Medicine

## 2017-07-03 ENCOUNTER — Other Ambulatory Visit: Payer: Self-pay | Admitting: Gastroenterology

## 2017-07-21 ENCOUNTER — Ambulatory Visit (INDEPENDENT_AMBULATORY_CARE_PROVIDER_SITE_OTHER): Payer: PRIVATE HEALTH INSURANCE | Admitting: Gastroenterology

## 2017-07-21 ENCOUNTER — Encounter: Payer: Self-pay | Admitting: Gastroenterology

## 2017-07-21 ENCOUNTER — Other Ambulatory Visit (INDEPENDENT_AMBULATORY_CARE_PROVIDER_SITE_OTHER): Payer: PRIVATE HEALTH INSURANCE

## 2017-07-21 VITALS — BP 118/60 | HR 64 | Ht 60.0 in | Wt 115.0 lb

## 2017-07-21 DIAGNOSIS — K582 Mixed irritable bowel syndrome: Secondary | ICD-10-CM | POA: Diagnosis not present

## 2017-07-21 DIAGNOSIS — K5000 Crohn's disease of small intestine without complications: Secondary | ICD-10-CM | POA: Diagnosis not present

## 2017-07-21 DIAGNOSIS — K5900 Constipation, unspecified: Secondary | ICD-10-CM | POA: Diagnosis not present

## 2017-07-21 DIAGNOSIS — R1310 Dysphagia, unspecified: Secondary | ICD-10-CM

## 2017-07-21 DIAGNOSIS — R197 Diarrhea, unspecified: Secondary | ICD-10-CM

## 2017-07-21 LAB — COMPREHENSIVE METABOLIC PANEL
ALK PHOS: 74 U/L (ref 39–117)
ALT: 22 U/L (ref 0–35)
AST: 25 U/L (ref 0–37)
Albumin: 4.4 g/dL (ref 3.5–5.2)
BILIRUBIN TOTAL: 0.6 mg/dL (ref 0.2–1.2)
BUN: 12 mg/dL (ref 6–23)
CALCIUM: 9.7 mg/dL (ref 8.4–10.5)
CO2: 30 mEq/L (ref 19–32)
Chloride: 101 mEq/L (ref 96–112)
Creatinine, Ser: 0.8 mg/dL (ref 0.40–1.20)
GFR: 76.06 mL/min (ref >60.00)
Glucose, Bld: 89 mg/dL (ref 70–99)
POTASSIUM: 4.3 meq/L (ref 3.5–5.1)
Sodium: 138 mEq/L (ref 135–145)
TOTAL PROTEIN: 7.6 g/dL (ref 6.0–8.3)

## 2017-07-21 LAB — CBC WITH DIFFERENTIAL/PLATELET
BASOS ABS: 0 10*3/uL (ref 0.0–0.1)
BASOS PCT: 0.7 % (ref 0.0–3.0)
Eosinophils Absolute: 0.1 10*3/uL (ref 0.0–0.7)
Eosinophils Relative: 2 % (ref 0.0–5.0)
HEMATOCRIT: 36.4 % (ref 36.0–46.0)
Hemoglobin: 12 g/dL (ref 12.0–15.0)
LYMPHS ABS: 2.6 10*3/uL (ref 0.7–4.0)
LYMPHS PCT: 43.9 % (ref 12.0–46.0)
MCHC: 33.1 g/dL (ref 30.0–36.0)
MCV: 100.9 fl — AB (ref 78.0–100.0)
MONOS PCT: 6.9 % (ref 3.0–12.0)
Monocytes Absolute: 0.4 10*3/uL (ref 0.1–1.0)
NEUTROS ABS: 2.8 10*3/uL (ref 1.4–7.7)
Neutrophils Relative %: 46.5 % (ref 43.0–77.0)
Platelets: 284 10*3/uL (ref 150.0–400.0)
RBC: 3.6 Mil/uL — AB (ref 3.87–5.11)
RDW: 15.7 % — ABNORMAL HIGH (ref 11.5–15.5)
WBC: 5.9 10*3/uL (ref 4.0–10.5)

## 2017-07-21 LAB — FOLATE

## 2017-07-21 LAB — FERRITIN: FERRITIN: 39.8 ng/mL (ref 10.0–291.0)

## 2017-07-21 LAB — VITAMIN B12: VITAMIN B 12: 280 pg/mL (ref 211–911)

## 2017-07-21 LAB — HIGH SENSITIVITY CRP: CRP HIGH SENSITIVITY: 0.31 mg/L (ref 0.000–5.000)

## 2017-07-21 MED ORDER — FOLIC ACID 1 MG PO TABS: 1.0000 mg | ORAL_TABLET | Freq: Every day | ORAL | 3 refills | Status: DC

## 2017-07-21 MED ORDER — LINACLOTIDE 72 MCG PO CAPS: 72.0000 ug | ORAL_CAPSULE | Freq: Every day | ORAL | 3 refills | Status: DC

## 2017-07-21 MED ORDER — CYANOCOBALAMIN 1000 MCG/ML IJ SOLN: INTRAMUSCULAR | 3 refills | Status: DC

## 2017-07-21 MED ORDER — SYRINGE (DISPOSABLE) 1 ML MISC: 6 refills | Status: DC

## 2017-07-21 MED ORDER — MERCAPTOPURINE 50 MG PO TABS: ORAL_TABLET | ORAL | 3 refills | Status: DC

## 2017-07-21 MED ORDER — OMEPRAZOLE 40 MG PO CPDR: 40.0000 mg | DELAYED_RELEASE_CAPSULE | Freq: Every day | ORAL | 3 refills | Status: DC

## 2017-07-21 NOTE — Patient Instructions (Signed)
Go to the basement for labs today    It has been recommended to you by your physician that you have a(n) Endoscopy completed. Per your request, we did not schedule the procedure(s) today. Please contact our office at [phone] should you decide to have the procedure completed.   Lactose-Free Diet, Adult If you have lactose intolerance, you are not able to digest lactose. Lactose is a natural sugar found mainly in milk and milk products. You may need to avoid all foods and beverages that contain lactose. A lactose-free diet can help you do this. What do I need to know about this diet?  Do not consume foods, beverages, vitamins, minerals, or medicines with lactose. Read ingredients lists carefully.  Look for the words "lactose-free" on labels.  Use lactase enzyme drops or tablets as directed by your health care provider.  Use lactose-free milk or a milk alternative, such as soy milk, for drinking and cooking.  Make sure you get enough calcium and vitamin D in your diet. A lactose-free eating plan can be lacking in these important nutrients.  Take calcium and vitamin D supplements as directed by your health care provider. Talk to your provider about supplements if you are not able to get enough calcium and vitamin D from food. Which foods have lactose? Lactose is found in:  Milk and foods made from milk.  Yogurt.  Cheese.  Butter.  Margarine.  Sour cream.  Cream.  Whipped toppings and nondairy creamers.  Ice cream and other milk-based desserts.  Lactose is also found in foods or products made with milk or milk ingredients. To find out whether a food contains milk or a milk ingredient, look at the ingredients list. Avoid foods with the statement "May contain milk" and foods that contain:  Butter.  Cream.  Milk.  Milk solids.  Milk powder.  Whey.  Curd.  Caseinate.  Lactose.  Lactalbumin.  Lactoglobulin.  What are some alternatives to milk and foods  made with milk products?  Lactose-free milk.  Soy milk with added calcium and vitamin D.  Almond, coconut, or rice milk with added calcium and vitamin D. Note that these are low in protein.  Soy products, such as soy yogurt, soy cheese, soy ice cream, and soy-based sour cream. Which foods can I eat? Grains Breads and rolls made without milk, such as [place], [place], or [place] bread, bagels, pita, and [employer]. Corn tortillas, corn meal, grits, and polenta. Crackers without lactose or milk solids, such as soda crackers and graham crackers. Cooked or dry cereals without lactose or milk solids. Pasta, quinoa, couscous, barley, oats, bulgur, farro, rice, wild rice, or other grains prepared without milk or lactose. Plain popcorn. Vegetables Fresh, frozen, and canned vegetables without cheese, cream, or butter sauces. Fruits All fresh, canned, frozen, or dried fruits that are not processed with lactose. Meats and Other Protein Sources Plain beef, chicken, fish, [place], lamb, veal, pork, wild game, or ham. Kosher-prepared meat products. Strained or junior meats that do not contain milk. Eggs. Soy meat substitutes. Beans, lentils, and hummus. Tofu. Nuts and seeds. Peanut or other nut butters without lactose. Soups, casseroles, and mixed dishes without cheese, cream, or milk. Dairy Lactose-free milk. Soy, rice, or almond milk with added calcium and vitamin D. Soy cheese and yogurt. Beverages Carbonated drinks. Tea. Coffee, freeze-dried coffee, and some instant coffees. Fruit and vegetable juices. Condiments Soy sauce. Carob powder. Olives. Gravy made with water. Baker's cocoa. Angie Fava. Pure seasonings and spices. Ketchup. Mustard. Bouillon. Broth. Sweets  and Desserts Water and fruit ices. Gelatin. Cookies, pies, or cakes made from allowed ingredients, such as angel food cake. Pudding made with water or a milk substitute. Lactose-free tofu desserts. Soy, coconut milk, or rice-milk-based frozen desserts.  Sugar. Honey. Jam, jelly, and marmalade. Molasses. Pure sugar candy. Dark chocolate without milk. Marshmallows. Fats and Oils Margarines and salad dressings that do not contain milk. Berniece Salines. Vegetable oils. Shortening. Mayonnaise. Soy or coconut-based cream. The items listed above may not be a complete list of recommended foods or beverages. Contact your dietitian for more options. Which foods are not recommended? Grains Breads and rolls that contain milk. Toaster pastries. Muffins, biscuits, waffles, cornbread, and pancakes. These can be prepared at home, commercial, or from mixes. Sweet rolls, donuts, English muffins, fry bread, lefse, flour tortillas with lactose, or [place] toast made with milk or milk ingredients. Crackers that contain lactose. Corn curls. Cooked or dry cereals with lactose. Vegetables Creamed or breaded vegetables. Vegetables in a cheese or butter sauce or with lactose-containing margarines. Instant potatoes. [place] fries. Scalloped or au gratin potatoes. Fruits None. Meats and Other Protein Sources Scrambled eggs, omelets, and souffles that contain milk. Creamed or breaded meat, fish, chicken, or [place]. Sausage products, such as wieners and liver sausage. Cold cuts that contain milk solids. Cheese, cottage cheese, ricotta cheese, and cheese spreads. Lasagna and macaroni and cheese. Pizza. Peanut or other nut butters with added milk solids. Casseroles or mixed dishes containing milk or cheese. Dairy All dairy products, including milk, goat's milk, buttermilk, kefir, acidophilus milk, flavored milk, evaporated milk, condensed milk, dulce de [place], eggnog, yogurt, cheese, and cheese spreads. Beverages Hot chocolate. Cocoa with lactose. Instant iced teas. Powdered fruit drinks. Smoothies made with milk or yogurt. Condiments Chewing gum that has lactose. Cocoa that has lactose. Spice blends if they contain milk products. Artificial sweeteners that contain lactose. Nondairy  creamers. Sweets and Desserts Ice cream, ice milk, gelato, sherbet, and frozen yogurt. Custard, pudding, and mousse. Cake, cream pies, cookies, and other desserts containing milk, cream, cream cheese, or milk chocolate. Pie crust made with milk-containing margarine or butter. Reduced-calorie desserts made with a sugar substitute that contains lactose. Toffee and butterscotch. Milk, white, or dark chocolate that contains milk. Fudge. Caramel. Fats and Oils Margarines and salad dressings that contain milk or cheese. Cream. Half and half. Cream cheese. Sour cream. Chip dips made with sour cream or yogurt. The items listed above may not be a complete list of foods and beverages to avoid. Contact your dietitian for more information. Am I getting enough calcium? Calcium is found in many foods that contain lactose and is important for bone health. The amount of calcium you need depends on your age:  Adults younger than 50 years: 1000 mg of calcium a day.  Adults older than 50 years: 1200 mg of calcium a day.  If you are not getting enough calcium, other calcium sources include:  Orange juice with calcium added. There are 300-350 mg of calcium in 1 cup of orange juice.  Sardines with edible bones. There are 325 mg of calcium in 3 oz of sardines.  Calcium-fortified soy milk. There are 300-400 mg of calcium in 1 cup of calcium-fortified soy milk.  Calcium-fortified rice or almond milk. There are 300 mg of calcium in 1 cup of calcium-fortified rice or almond milk.  Canned salmon with edible bones. There are 180 mg of calcium in 3 oz of canned salmon with edible bones.  Calcium-fortified breakfast cereals. There are [phone]  mg of calcium in calcium-fortified breakfast cereals.  Tofu set with calcium sulfate. There are 250 mg of calcium in  cup of tofu set with calcium sulfate.  Spinach, cooked. There are 145 mg of calcium in  cup of cooked spinach.  Edamame, cooked. There are 130 mg of calcium  in  cup of cooked edamame.  Collard greens, cooked. There are 125 mg of calcium in  cup of cooked collard greens.  Kale, frozen or cooked. There are 90 mg of calcium in  cup of cooked or frozen kale.  Almonds. There are 95 mg of calcium in  cup of almonds.  Broccoli, cooked. There are 60 mg of calcium in 1 cup of cooked broccoli.  This information is not intended to replace advice given to you by your health care provider. Make sure you discuss any questions you have with your health care provider. Document Released: 04/03/2002 Document Revised: 03/19/2016 Document Reviewed: 01/12/2014 Elsevier Interactive Patient Education  2018 [employer].

## 2017-07-21 NOTE — Progress Notes (Signed)
Michelle Vasquez    [id number]    [birth year]  Primary Care Physician:Gutierrez, [name], MD  Referring Physician: [name], MD [address] [place],  [address]  Chief complaint:  GERD, constipation alternating with diarrhea, Michelle disease  HPI: 66 year old female with Michelle Vasquez status post resection in 1981 here for follow-up visit. Patient had evidence of ileocolonic stricture based on colonoscopy in May 2016, barium study showed normal flow with no significant delay. She is on 6-MP 50 mg daily. Patient takes B12 injections every month for B12 deficiency. Complains of increased fatigue 2 weeks after B-12 which improves when she does the B12 injection. Denies any nausea, vomiting, abdominal pain, melena or blood per rectum. She has chronic GERD well-controlled with omeprazole with occasional breakthrough symptoms. He complained of worsening dysphagia to mostly food and sometimes liquids in the past few months. Denies any food impactions. Occasionally she regurgitates.  Patient also has alternating constipation and diarrhea and is currently taking Linzess 72 g every other day     Outpatient Encounter Prescriptions as of 07/21/2017  Medication Sig  . amLODipine (NORVASC) 5 MG tablet TAKE 1 TABLET BY MOUTH DAILY  . cetirizine (ZYRTEC) 10 MG tablet Take 10 mg by mouth at bedtime.  . cyanocobalamin (,VITAMIN B-12,) 1000 MCG/ML injection INJECT 1ML ONCE A MONTH FOR 6 MONTHS.  . folic acid (FOLVITE) 1 MG tablet TAKE 1 TABLET BY MOUTH DAILY  . guaiFENesin (MUCINEX) 600 MG 12 hr tablet Take 1 tablet (600 mg total) by mouth 2 (two) times daily.  Marland Kitchen linaclotide (LINZESS) 72 MCG capsule Take 1 capsule (72 mcg total) by mouth daily before breakfast.  . mercaptopurine (PURINETHOL) 50 MG tablet TAKE 1 TABLET BY MOUTH DAILY 1 HOUR BEFORE OR 2 HOURS AFTER MEALS ON AN EMPTY STOMACH.  Marland Kitchen omeprazole (PRILOSEC) 40 MG capsule TAKE ONE CAPSULE BY  MOUTH DAILY  . sertraline (ZOLOFT) 100 MG tablet TAKE 1 TABLET BY MOUTH DAILY  . [DISCONTINUED] folic acid (FOLVITE) 1 MG tablet TAKE 1 TABLET BY MOUTH DAILY  . [DISCONTINUED] omeprazole (PRILOSEC) 40 MG capsule TAKE ONE CAPSULE BY MOUTH DAILY   Facility-Administered Encounter Medications as of 07/21/2017  Medication  . cyanocobalamin ((VITAMIN B-12)) injection 1,000 mcg    Allergies as of 07/21/2017 - Review Complete 07/21/2017  Allergen Reaction Noted  . Celexa [citalopram hydrobromide] Other (See Comments) 01/10/2014    Past Medical History:  Diagnosis Date  . Allergy    seasonal  . Anxiety and depression 07/03/2011  . B12 deficiency   . Blood transfusion without reported diagnosis   . Central hearing loss   . Crohn disease ([place])    s/p colectomy on 6MP, ?bacterial overgrowth  . Fatty liver 12/15/11  . History of small bowel obstruction   . HTN (hypertension)   . Internal hemorrhoids without mention of complication   . Osteomalacia, unspecified   . Perennial allergic rhinitis with seasonal variation   . Unspecified deficiency anemia   . Unspecified polyarthropathy or polyarthritis, multiple sites     Past Surgical History:  Procedure Laterality Date  . APPENDECTOMY    . COLONOSCOPY  02/25/05   Internal hemms; crohns; narrow anast  . COLONOSCOPY  02/26/10   Prior right hemicolectomy o/w benign (Dr. Olevia Perches)  . COLONOSCOPY  02/2015   benign biopsies, Michelle with stenotic stricture at anastomosis Olevia Perches)  . DEXA  07/06/2011   normal T score -1.0 femur, spine  .  ESOPHAGOGASTRODUODENOSCOPY  02/25/05   Dilated stricture  . HEMICOLECTOMY  1980   all small intestine, none large  . OTHER SURGICAL HISTORY  1960s, 1978, 1980   ileal resection x 3 and subtotal colon resection  . TOTAL ABDOMINAL HYSTERECTOMY  1975   ovaries out as well  . UPPER GASTROINTESTINAL ENDOSCOPY      Family History  Problem Relation Age of Onset  . Michelle disease Father        colostomy  .  Hypertension Mother   . Heart disease Mother 71  . Alcohol abuse Brother   . Cirrhosis Brother   . Michelle disease Sister   . Michelle disease Paternal Aunt        x 3  . Michelle disease Unknown        neice x 2  . Colon cancer Neg Hx   . Cancer Neg Hx   . Diabetes Neg Hx   . Rectal cancer Neg Hx   . Stomach cancer Neg Hx     Social History   Social History  . Marital status: Divorced    Spouse name: N/A  . Number of children: 3  . Years of education: N/A   Occupational History  . Medical Transcription Other    [employer]   Social History Main Topics  . Smoking status: Former Smoker    Packs/day: 0.25    Years: 15.00    Types: Cigarettes    Quit date: 10/26/1978  . Smokeless tobacco: Never Used  . Alcohol use No  . Drug use: No  . Sexual activity: Not on file   Other Topics Concern  . 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  HENT: Negative.   Eyes: Negative for blurred vision.  Respiratory: Negative for cough, shortness of breath and wheezing.   Cardiovascular: Negative for chest pain and palpitations.  Gastrointestinal: as per HPI Genitourinary: Negative for dysuria, urgency, frequency and hematuria.  Musculoskeletal: Negative for myalgias, back pain and joint pain.  Skin: Negative for itching and rash.  Neurological: Negative for dizziness, tremors, focal weakness, seizures and loss of consciousness.  Endo/Heme/Allergies: Positive for seasonal allergies.  Psychiatric/Behavioral: Negative for depression, suicidal ideas and hallucinations.  All other systems reviewed and are negative.   Physical Exam: Vitals:   07/21/17 1347  BP: 118/60  Pulse: 64   Body mass  index is 22.46 kg/m. Gen:      No acute distress HEENT:  EOMI, sclera anicteric Neck:     No masses; no thyromegaly Lungs:    Clear to auscultation bilaterally; normal respiratory effort CV:         Regular rate and rhythm; no murmurs Abd:      + bowel sounds; soft, non-tender; no palpable masses, no distension Ext:    No edema; adequate peripheral perfusion Skin:      Warm and dry; no rash Neuro: alert and oriented x 3 Psych: normal mood and affect  Data Reviewed:  Reviewed labs, radiology imaging, old records and pertinent past GI work up   Assessment and Plan/Recommendations:  66 year old female with history of Michelle disease status post terminal ileal resection with ileocolonic anastomosis, chronic B12 deficiency,  chronic GERD here with complaints of worsening solid dysphagia  Dysphagia: Patient retired last year and currently is on Medicare and she is not sure about her coverage Reluctant to undergo EGD at this time Advised patient to avoid tough meats or bread  Chew food well with sips of water  B12 deficiency: Increase B-12 injections to every 2 weeks  GERD: Continue omeprazole and antireflux measures  Michelle disease: Continue 6-MP Follow-up CBC, CMP, B12, folate Discussed flu vaccination, patient is planning to get it next week  Constipation with alternating diarrhea: Continue low-dose Linzess 72 g daily Trial of lactose-free diet  25 minutes was spent face-to-face with the patient. Greater than 50% of the time used for counseling as well as treatment plan and follow-up. She had multiple questions which were answered to her satisfaction  K. Denzil Magnuson , MD [phone] Mon-Fri 8a-5p [phone] after 5p, weekends, holidays  CC: [name], MD

## 2017-08-05 ENCOUNTER — Other Ambulatory Visit: Payer: Self-pay | Admitting: Family Medicine

## 2017-09-06 ENCOUNTER — Other Ambulatory Visit: Payer: Self-pay | Admitting: Family Medicine

## 2017-10-06 ENCOUNTER — Other Ambulatory Visit: Payer: Self-pay | Admitting: Family Medicine

## 2017-10-26 HISTORY — PX: ESOPHAGOGASTRODUODENOSCOPY: SHX1529

## 2017-11-09 ENCOUNTER — Ambulatory Visit (INDEPENDENT_AMBULATORY_CARE_PROVIDER_SITE_OTHER): Payer: Medicare HMO | Admitting: Family Medicine

## 2017-11-09 ENCOUNTER — Encounter: Payer: Self-pay | Admitting: Family Medicine

## 2017-11-09 VITALS — BP 116/66 | HR 77 | Temp 97.9°F | Ht 60.0 in | Wt 113.0 lb

## 2017-11-09 DIAGNOSIS — E8941 Symptomatic postprocedural ovarian failure: Secondary | ICD-10-CM

## 2017-11-09 DIAGNOSIS — F329 Major depressive disorder, single episode, unspecified: Secondary | ICD-10-CM

## 2017-11-09 DIAGNOSIS — Z9049 Acquired absence of other specified parts of digestive tract: Secondary | ICD-10-CM

## 2017-11-09 DIAGNOSIS — K5010 Crohn's disease of large intestine without complications: Secondary | ICD-10-CM

## 2017-11-09 DIAGNOSIS — Z0000 Encounter for general adult medical examination without abnormal findings: Secondary | ICD-10-CM | POA: Diagnosis not present

## 2017-11-09 DIAGNOSIS — Z23 Encounter for immunization: Secondary | ICD-10-CM

## 2017-11-09 DIAGNOSIS — F32A Depression, unspecified: Secondary | ICD-10-CM

## 2017-11-09 DIAGNOSIS — Z1231 Encounter for screening mammogram for malignant neoplasm of breast: Secondary | ICD-10-CM

## 2017-11-09 DIAGNOSIS — Z1239 Encounter for other screening for malignant neoplasm of breast: Secondary | ICD-10-CM

## 2017-11-09 DIAGNOSIS — F419 Anxiety disorder, unspecified: Secondary | ICD-10-CM

## 2017-11-09 DIAGNOSIS — I10 Essential (primary) hypertension: Secondary | ICD-10-CM | POA: Diagnosis not present

## 2017-11-09 DIAGNOSIS — E538 Deficiency of other specified B group vitamins: Secondary | ICD-10-CM

## 2017-11-09 MED ORDER — SERTRALINE HCL 100 MG PO TABS: ORAL_TABLET | ORAL | 11 refills | Status: DC

## 2017-11-09 MED ORDER — AMLODIPINE BESYLATE 5 MG PO TABS: 5.0000 mg | ORAL_TABLET | Freq: Every day | ORAL | 11 refills | Status: DC

## 2017-11-09 NOTE — Patient Instructions (Addendum)
Schedule mammogram at your convenience prevnar pneumonia shot today.  You are doing well today. Return as needed or in 1 year for next visit - schedule medicare wellness visit with Katha Cabal and then physical with me.  Health Maintenance, Female Adopting a healthy lifestyle and getting preventive care can go a long way to promote health and wellness. Talk with your health care provider about what schedule of regular examinations is right for you. This is a good chance for you to check in with your provider about disease prevention and staying healthy. In between checkups, there are plenty of things you can do on your own. Experts have done a lot of research about which lifestyle changes and preventive measures are most likely to keep you healthy. Ask your health care provider for more information. Weight and diet Eat a healthy diet  Be sure to include plenty of vegetables, fruits, low-fat dairy products, and lean protein.  Do not eat a lot of foods high in solid fats, added sugars, or salt.  Get regular exercise. This is one of the most important things you can do for your health. ? Most adults should exercise for at least 150 minutes each week. The exercise should increase your heart rate and make you sweat (moderate-intensity exercise). ? Most adults should also do strengthening exercises at least twice a week. This is in addition to the moderate-intensity exercise.  Maintain a healthy weight  Body mass index (BMI) is a measurement that can be used to identify possible weight problems. It estimates body fat based on height and weight. Your health care provider can help determine your BMI and help you achieve or maintain a healthy weight.  For females 75 years of age and older: ? A BMI below 18.5 is considered underweight. ? A BMI of 18.5 to 24.9 is normal. ? A BMI of 25 to 29.9 is considered overweight. ? A BMI of 30 and above is considered obese.  Watch levels of cholesterol and blood  lipids  You should start having your blood tested for lipids and cholesterol at 68 years of age, then have this test every 5 years.  You may need to have your cholesterol levels checked more often if: ? Your lipid or cholesterol levels are high. ? You are older than 68 years of age. ? You are at high risk for heart disease.  Cancer screening Lung Cancer  Lung cancer screening is recommended for adults 36-52 years old who are at high risk for lung cancer because of a history of smoking.  A yearly low-dose CT scan of the lungs is recommended for people who: ? Currently smoke. ? Have quit within the past 15 years. ? Have at least a 30-pack-year history of smoking. A pack year is smoking an average of one pack of cigarettes a day for 1 year.  Yearly screening should continue until it has been 15 years since you quit.  Yearly screening should stop if you develop a health problem that would prevent you from having lung cancer treatment.  Breast Cancer  Practice breast self-awareness. This means understanding how your breasts normally appear and feel.  It also means doing regular breast self-exams. Let your health care provider know about any changes, no matter how small.  If you are in your 20s or 30s, you should have a clinical breast exam (CBE) by a health care provider every 1-3 years as part of a regular health exam.  If you are 80 or older, have a  CBE every year. Also consider having a breast X-ray (mammogram) every year.  If you have a family history of breast cancer, talk to your health care provider about genetic screening.  If you are at high risk for breast cancer, talk to your health care provider about having an MRI and a mammogram every year.  Breast cancer gene (BRCA) assessment is recommended for women who have family members with BRCA-related cancers. BRCA-related cancers include: ? Breast. ? Ovarian. ? Tubal. ? Peritoneal cancers.  Results of the assessment will  determine the need for genetic counseling and BRCA1 and BRCA2 testing.  Cervical Cancer Your health care provider may recommend that you be screened regularly for cancer of the pelvic organs (ovaries, uterus, and vagina). This screening involves a pelvic examination, including checking for microscopic changes to the surface of your cervix (Pap test). You may be encouraged to have this screening done every 3 years, beginning at age 30.  For women ages 22-65, health care providers may recommend pelvic exams and Pap testing every 3 years, or they may recommend the Pap and pelvic exam, combined with testing for human papilloma virus (HPV), every 5 years. Some types of HPV increase your risk of cervical cancer. Testing for HPV may also be done on women of any age with unclear Pap test results.  Other health care providers may not recommend any screening for nonpregnant women who are considered low risk for pelvic cancer and who do not have symptoms. Ask your health care provider if a screening pelvic exam is right for you.  If you have had past treatment for cervical cancer or a condition that could lead to cancer, you need Pap tests and screening for cancer for at least 20 years after your treatment. If Pap tests have been discontinued, your risk factors (such as having a new sexual partner) need to be reassessed to determine if screening should resume. Some women have medical problems that increase the chance of getting cervical cancer. In these cases, your health care provider may recommend more frequent screening and Pap tests.  Colorectal Cancer  This type of cancer can be detected and often prevented.  Routine colorectal cancer screening usually begins at 68 years of age and continues through 68 years of age.  Your health care provider may recommend screening at an earlier age if you have risk factors for colon cancer.  Your health care provider may also recommend using home test kits to check  for hidden blood in the stool.  A small camera at the end of a tube can be used to examine your colon directly (sigmoidoscopy or colonoscopy). This is done to check for the earliest forms of colorectal cancer.  Routine screening usually begins at age 53.  Direct examination of the colon should be repeated every 5-10 years through 68 years of age. However, you may need to be screened more often if early forms of precancerous polyps or small growths are found.  Skin Cancer  Check your skin from head to toe regularly.  Tell your health care provider about any new moles or changes in moles, especially if there is a change in a mole's shape or color.  Also tell your health care provider if you have a mole that is larger than the size of a pencil eraser.  Always use sunscreen. Apply sunscreen liberally and repeatedly throughout the day.  Protect yourself by wearing long sleeves, pants, a wide-brimmed hat, and sunglasses whenever you are outside.  Heart disease,  diabetes, and high blood pressure  High blood pressure causes heart disease and increases the risk of stroke. High blood pressure is more likely to develop in: ? People who have blood pressure in the high end of the normal range (130-139/85-89 mm Hg). ? People who are overweight or obese. ? People who are African American.  If you are 61-68 years of age, have your blood pressure checked every 3-5 years. If you are 92 years of age or older, have your blood pressure checked every year. You should have your blood pressure measured twice-once when you are at a hospital or clinic, and once when you are not at a hospital or clinic. Record the average of the two measurements. To check your blood pressure when you are not at a hospital or clinic, you can use: ? An automated blood pressure machine at a pharmacy. ? A home blood pressure monitor.  If you are between 28 years and 39 years old, ask your health care provider if you should take  aspirin to prevent strokes.  Have regular diabetes screenings. This involves taking a blood sample to check your fasting blood sugar level. ? If you are at a normal weight and have a low risk for diabetes, have this test once every three years after 68 years of age. ? If you are overweight and have a high risk for diabetes, consider being tested at a younger age or more often. Preventing infection Hepatitis B  If you have a higher risk for hepatitis B, you should be screened for this virus. You are considered at high risk for hepatitis B if: ? You were born in a country where hepatitis B is common. Ask your health care provider which countries are considered high risk. ? Your parents were born in a high-risk country, and you have not been immunized against hepatitis B (hepatitis B vaccine). ? You have HIV or AIDS. ? You use needles to inject street drugs. ? You live with someone who has hepatitis B. ? You have had sex with someone who has hepatitis B. ? You get hemodialysis treatment. ? You take certain medicines for conditions, including cancer, organ transplantation, and autoimmune conditions.  Hepatitis C  Blood testing is recommended for: ? Everyone born from [birth year] through 1966. ? Anyone with known risk factors for hepatitis C.  Sexually transmitted infections (STIs)  You should be screened for sexually transmitted infections (STIs) including gonorrhea and chlamydia if: ? You are sexually active and are younger than 68 years of age. ? You are older than 68 years of age and your health care provider tells you that you are at risk for this type of infection. ? Your sexual activity has changed since you were last screened and you are at an increased risk for chlamydia or gonorrhea. Ask your health care provider if you are at risk.  If you do not have HIV, but are at risk, it may be recommended that you take a prescription medicine daily to prevent HIV infection. This is called  pre-exposure prophylaxis (PrEP). You are considered at risk if: ? You are sexually active and do not regularly use condoms or know the HIV status of your partner(s). ? You take drugs by injection. ? You are sexually active with a partner who has HIV.  Talk with your health care provider about whether you are at high risk of being infected with HIV. If you choose to begin PrEP, you should first be tested for HIV. You should  then be tested every 3 months for as long as you are taking PrEP. Pregnancy  If you are premenopausal and you may become pregnant, ask your health care provider about preconception counseling.  If you may become pregnant, take 400 to 800 micrograms (mcg) of folic acid every day.  If you want to prevent pregnancy, talk to your health care provider about birth control (contraception). Osteoporosis and menopause  Osteoporosis is a disease in which the bones lose minerals and strength with aging. This can result in serious bone fractures. Your risk for osteoporosis can be identified using a bone density scan.  If you are 18 years of age or older, or if you are at risk for osteoporosis and fractures, ask your health care provider if you should be screened.  Ask your health care provider whether you should take a calcium or vitamin D supplement to lower your risk for osteoporosis.  Menopause may have certain physical symptoms and risks.  Hormone replacement therapy may reduce some of these symptoms and risks. Talk to your health care provider about whether hormone replacement therapy is right for you. Follow these instructions at home:  Schedule regular health, dental, and eye exams.  Stay current with your immunizations.  Do not use any tobacco products including cigarettes, chewing tobacco, or electronic cigarettes.  If you are pregnant, do not drink alcohol.  If you are breastfeeding, limit how much and how often you drink alcohol.  Limit alcohol intake to no more  than 1 drink per day for nonpregnant women. One drink equals 12 ounces of beer, 5 ounces of wine, or 1 ounces of hard liquor.  Do not use street drugs.  Do not share needles.  Ask your health care provider for help if you need support or information about quitting drugs.  Tell your health care provider if you often feel depressed.  Tell your health care provider if you have ever been abused or do not feel safe at home. This information is not intended to replace advice given to you by your health care provider. Make sure you discuss any questions you have with your health care provider. Document Released: 04/27/2011 Document Revised: 03/19/2016 Document Reviewed: 07/16/2015 Elsevier Interactive Patient Education  [employer]. tab

## 2017-11-09 NOTE — Assessment & Plan Note (Signed)
Appreciate GI care. Will update DEXA scan given high risk for osteoporosis development. She is not regular with 1264m cal daily. Suggested start cal/vit D tablet daily.

## 2017-11-09 NOTE — Assessment & Plan Note (Signed)
Preventative protocols reviewed and updated unless pt declined. Discussed healthy diet and lifestyle.  

## 2017-11-09 NOTE — Addendum Note (Signed)
Addended by: [name] on: 11/09/2017 03:07 PM   Modules accepted: Orders

## 2017-11-09 NOTE — Assessment & Plan Note (Signed)
Chronic, stable on 162m sertraline. Will continue current dose, declines taper at this time.

## 2017-11-09 NOTE — Addendum Note (Signed)
Addended by: Brenton Grills on: 0/39/7953 69:22 PM   Modules accepted: Orders

## 2017-11-09 NOTE — Assessment & Plan Note (Signed)
Chronic, stable. Continue current regimen. 

## 2017-11-09 NOTE — Assessment & Plan Note (Signed)
Continue b12 shots at home - now on Q2wk regimen.

## 2017-11-09 NOTE — Progress Notes (Addendum)
BP 116/66 (BP Location: Left Arm, Patient Position: Sitting, Cuff Size: Normal)   Pulse 77   Temp 97.9 F (36.6 C) (Oral)   Ht 5' (1.524 m)   Wt 113 lb (51.3 kg)   SpO2 94%   BMI 22.07 kg/m    CC: CPE Subjective:    Patient ID: Michelle Vasquez, female    DOB: [birth year], 67 y.o.   MRN: [id number]  HPI: Michelle Vasquez is a 67 y.o. female presenting on 11/09/2017 for Annual Exam   Retired last year. Works [employer].  Receives longterm B12 injections recently changed to Q2 wks shots Received medicare 08/2016. Past date for welcome to medicare visit.  Preventative: COLONOSCOPY Date: 02/2015 benign biopsies, crohn's with stenotic stricture at anastomosis Olevia Perches). Has established with Dr Silverio Decamp.  Well woman - s/p total hysterectomy for fibroids, ovaries removed at age 28yo. Took hormone replacement only for a few months. DEXA 2012 - T-1.0. Reviewed calcium in diet. Eats cheese. Other dairy products cause GI upset.  Lung cancer screening - not eligible  Mammogram 07/2016 WNL. Breast exams at home. Due for f/u Flu shot yearly at work. Tdap 2015 Pneumovax 2014. Prevnar today.  zostavax - 2017 shingrix - discussed - will defer given recent zostavax.  Completed Hep B series.  PPD at work yearly. Seat belt use discussed.  Sunscreen use discussed. No suspicious moles.  Ex smoker quit remotely - but around smokers Alcohol - none  Caffeine: 2 cups coffee/day  Divorced 1989  3 children  Medical transcriptionist  Activity: walks dog otherwise no regular activity  Diet: good water, fruits/vegetables ok   Relevant past medical, surgical, family and social history reviewed and updated as indicated. Interim medical history since our last visit reviewed. Allergies and medications reviewed and updated. Outpatient Medications Prior to Visit  Medication Sig Dispense Refill  . cetirizine (ZYRTEC) 10 MG tablet TAKE ONE TABLET DAILY AS DIRECTED 100 tablet 3  . cyanocobalamin (,VITAMIN  B-12,) 1000 MCG/ML injection Inject 1 ML every 2 weeks 60 mL 3  . folic acid (FOLVITE) 1 MG tablet Take 1 tablet (1 mg total) by mouth daily. 90 tablet 3  . guaiFENesin (MUCINEX) 600 MG 12 hr tablet Take 1 tablet (600 mg total) by mouth 2 (two) times daily. 60 tablet 3  . linaclotide (LINZESS) 72 MCG capsule Take 1 capsule (72 mcg total) by mouth daily before breakfast. 90 capsule 3  . mercaptopurine (PURINETHOL) 50 MG tablet TAKE 1 TABLET BY MOUTH DAILY 1 HOUR BEFORE OR 2 HOURS AFTER MEALS ON AN EMPTY STOMACH. 90 tablet 3  . omeprazole (PRILOSEC) 40 MG capsule Take 1 capsule (40 mg total) by mouth daily. 90 capsule 3  . Syringe, Disposable, (BD TUBERCULIN SYRINGE) 1 ML MISC 6 syringes for 1 month for B12 injections twice a month 6 each 6  . amLODipine (NORVASC) 5 MG tablet TAKE 1 TABLET BY MOUTH ONCE A DAY *NEED OFFICE VISIT 30 tablet 1  . sertraline (ZOLOFT) 100 MG tablet TAKE 1 TABLET BY MOUTH ONCE A DAY *NEED OFFICE VISIT 30 tablet 1   Facility-Administered Medications Prior to Visit  Medication Dose Route Frequency Provider Last Rate Last Dose  . cyanocobalamin ((VITAMIN B-12)) injection 1,000 mcg  1,000 mcg Intramuscular Q30 days [name], MD   1,000 mcg at 01/19/17 1524     Per HPI unless specifically indicated in ROS section below Review of Systems  Constitutional: Negative for activity change, appetite change, chills, fatigue, fever and unexpected weight change.  HENT: Negative for hearing loss.   Eyes: Negative for visual disturbance.  Respiratory: Negative for cough, chest tightness, shortness of breath and wheezing.   Cardiovascular: Negative for chest pain, palpitations and leg swelling.  Gastrointestinal: Positive for abdominal pain. Negative for abdominal distention, blood in stool, constipation, diarrhea, nausea and vomiting.  Genitourinary: Negative for difficulty urinating and hematuria.  Musculoskeletal: Negative for arthralgias, myalgias and neck pain.  Skin:  Negative for rash.  Neurological: Negative for dizziness, seizures, syncope and headaches.  Hematological: Negative for adenopathy. Does not bruise/bleed easily.  Psychiatric/Behavioral: Negative for dysphoric mood. The patient is not nervous/anxious.        Objective:    BP 116/66 (BP Location: Left Arm, Patient Position: Sitting, Cuff Size: Normal)   Pulse 77   Temp 97.9 F (36.6 C) (Oral)   Ht 5' (1.524 m)   Wt 113 lb (51.3 kg)   SpO2 94%   BMI 22.07 kg/m   Wt Readings from Last 3 Encounters:  11/09/17 113 lb (51.3 kg)  07/21/17 115 lb (52.2 kg)  06/14/17 115 lb 6.4 oz (52.3 kg)    Physical Exam  Constitutional: She is oriented to person, place, and time. She appears well-developed and well-nourished. No distress.  HENT:  Head: Normocephalic and atraumatic.  Right Ear: Hearing, tympanic membrane, external ear and ear canal normal.  Left Ear: Hearing, tympanic membrane, external ear and ear canal normal.  Nose: Nose normal.  Mouth/Throat: Uvula is midline, oropharynx is clear and moist and mucous membranes are normal. No oropharyngeal exudate, posterior oropharyngeal edema or posterior oropharyngeal erythema.  Eyes: Conjunctivae and EOM are normal. Pupils are equal, round, and reactive to light. No scleral icterus.  Neck: Normal range of motion. Neck supple. No thyromegaly present.  Cardiovascular: Normal rate, regular rhythm, normal heart sounds and intact distal pulses.  No murmur heard. Pulses:      Radial pulses are 2+ on the right side, and 2+ on the left side.  Pulmonary/Chest: Effort normal and breath sounds normal. No respiratory distress. She has no wheezes. She has no rales.  Abdominal: Soft. Bowel sounds are normal. She exhibits no distension and no mass. There is no tenderness. There is no rebound and no guarding.  Musculoskeletal: Normal range of motion. She exhibits no edema.  Lymphadenopathy:    She has no cervical adenopathy.  Neurological: She is alert and  oriented to person, place, and time.  CN grossly intact, station and gait intact  Skin: Skin is warm and dry. No rash noted.  Psychiatric: She has a normal mood and affect. Her behavior is normal. Judgment and thought content normal.  Nursing note and vitals reviewed.  Results for orders placed or performed in visit on 07/21/17  CBC w/Diff  Result Value Ref Range   WBC 5.9 4.0 - 10.5 K/uL   RBC 3.60 (L) 3.87 - 5.11 Mil/uL   Hemoglobin 12.0 12.0 - 15.0 g/dL   HCT 36.4 36.0 - 46.0 %   MCV 100.9 (H) 78.0 - 100.0 fl   MCHC 33.1 30.0 - 36.0 g/dL   RDW 15.7 (H) 11.5 - 15.5 %   Platelets 284.0 150.0 - 400.0 K/uL   Neutrophils Relative % 46.5 43.0 - 77.0 %   Lymphocytes Relative 43.9 12.0 - 46.0 %   Monocytes Relative 6.9 3.0 - 12.0 %   Eosinophils Relative 2.0 0.0 - 5.0 %   Basophils Relative 0.7 0.0 - 3.0 %   Neutro Abs 2.8 1.4 - 7.7 K/uL  Lymphs Abs 2.6 0.7 - 4.0 K/uL   Monocytes Absolute 0.4 0.1 - 1.0 K/uL   Eosinophils Absolute 0.1 0.0 - 0.7 K/uL   Basophils Absolute 0.0 0.0 - 0.1 K/uL  Comp Met (CMET)  Result Value Ref Range   Sodium 138 135 - 145 mEq/L   Potassium 4.3 3.5 - 5.1 mEq/L   Chloride 101 96 - 112 mEq/L   CO2 30 19 - 32 mEq/L   Glucose, Bld 89 70 - 99 mg/dL   BUN 12 6 - 23 mg/dL   Creatinine, Ser 0.80 0.40 - 1.20 mg/dL   Total Bilirubin 0.6 0.2 - 1.2 mg/dL   Alkaline Phosphatase 74 39 - 117 U/L   AST 25 0 - 37 U/L   ALT 22 0 - 35 U/L   Total Protein 7.6 6.0 - 8.3 g/dL   Albumin 4.4 3.5 - 5.2 g/dL   Calcium 9.7 8.4 - 10.5 mg/dL   GFR 76.06 >60.00 mL/min  CRP High sensitivity  Result Value Ref Range   CRP, High Sensitivity 0.310 0.000 - 5.000 mg/L  Ferritin  Result Value Ref Range   Ferritin 39.8 10.0 - 291.0 ng/mL  B12  Result Value Ref Range   Vitamin B-12 280 211 - 911 pg/mL  Folate  Result Value Ref Range   Folate >23.9 >5.9 ng/mL      Assessment & Plan:   Problem List Items Addressed This Visit    Anxiety and depression    Chronic, stable on  169m sertraline. Will continue current dose, declines taper at this time.       Relevant Medications   sertraline (ZOLOFT) 100 MG tablet   Crohn's disease ([place]    Appreciate GI care. Will update DEXA scan given high risk for osteoporosis development. She is not regular with 12030mcal daily. Suggested start cal/vit D tablet daily.       Relevant Orders   DG Bone Density   Essential hypertension    Chronic, stable. Continue current regimen.       Relevant Medications   amLODipine (NORVASC) 5 MG tablet   Healthcare maintenance - Primary    Preventative protocols reviewed and updated unless pt declined. Discussed healthy diet and lifestyle.       MENOPAUSE, SURGICAL   Relevant Orders   DG Bone Density   S/P partial colectomy   Vitamin B12 deficiency    Continue b12 shots at home - now on Q2wk regimen.        Other Visit Diagnoses    Breast cancer screening       Relevant Orders   MM Digital Screening       Follow up plan: Return in about 1 year (around 11/09/2018) for annual exam, prior fasting for blood work, medicare wellness visit.  JaRia BushMD

## 2017-11-10 ENCOUNTER — Telehealth: Payer: Self-pay | Admitting: Gastroenterology

## 2017-11-10 NOTE — Telephone Encounter (Signed)
Patient was seen in September for dysphagia. She was unable to schedule the recommended EGD at that time due to her insurance changing to Medicare. She reports her problem has continued but not worsened. She has "adapted" but she is interested in pursuing your recommendations. I have scheduled her in the [facility] for her EGD.

## 2017-11-10 NOTE — Telephone Encounter (Signed)
Ok thanks 

## 2017-11-15 ENCOUNTER — Other Ambulatory Visit: Payer: Self-pay

## 2017-11-15 ENCOUNTER — Ambulatory Visit (AMBULATORY_SURGERY_CENTER): Payer: Medicare HMO | Admitting: *Deleted

## 2017-11-15 VITALS — Ht 60.0 in | Wt 115.0 lb

## 2017-11-15 DIAGNOSIS — R1310 Dysphagia, unspecified: Secondary | ICD-10-CM

## 2017-11-15 NOTE — Progress Notes (Signed)
Denies allergies to eggs or soy products. Denies complications with sedation or anesthesia. Denies O2 use. Denies use of diet or weight loss medications.  Emmi instructions given for endoscopy.

## 2017-11-16 ENCOUNTER — Encounter: Payer: Self-pay | Admitting: Gastroenterology

## 2017-11-22 ENCOUNTER — Encounter: Payer: Self-pay | Admitting: Gastroenterology

## 2017-11-22 ENCOUNTER — Ambulatory Visit (AMBULATORY_SURGERY_CENTER): Payer: Medicare HMO | Admitting: Gastroenterology

## 2017-11-22 VITALS — BP 105/55 | HR 78 | Temp 97.5°F | Resp 16 | Ht 60.0 in | Wt 115.0 lb

## 2017-11-22 DIAGNOSIS — R1310 Dysphagia, unspecified: Secondary | ICD-10-CM

## 2017-11-22 DIAGNOSIS — K5090 Crohn's disease, unspecified, without complications: Secondary | ICD-10-CM | POA: Diagnosis not present

## 2017-11-22 DIAGNOSIS — K222 Esophageal obstruction: Secondary | ICD-10-CM | POA: Diagnosis not present

## 2017-11-22 DIAGNOSIS — R1319 Other dysphagia: Secondary | ICD-10-CM

## 2017-11-22 DIAGNOSIS — I10 Essential (primary) hypertension: Secondary | ICD-10-CM | POA: Diagnosis not present

## 2017-11-22 MED ORDER — SODIUM CHLORIDE 0.9 % IV SOLN: 500.0000 mL | Freq: Once | INTRAVENOUS | Status: DC

## 2017-11-22 NOTE — Patient Instructions (Signed)
*  Handouts given on dilation and stricture*  YOU HAD AN ENDOSCOPIC PROCEDURE TODAY AT [facility]:   Refer to the procedure report that was given to you for any specific questions about what was found during the examination.  If the procedure report does not answer your questions, please call your gastroenterologist to clarify.  If you requested that your care partner not be given the details of your procedure findings, then the procedure report has been included in a sealed envelope for you to review at your convenience later.  YOU SHOULD EXPECT: Some feelings of bloating in the abdomen. Passage of more gas than usual.  Walking can help get rid of the air that was put into your GI tract during the procedure and reduce the bloating. If you had a lower endoscopy (such as a colonoscopy or flexible sigmoidoscopy) you may notice spotting of blood in your stool or on the toilet paper. If you underwent a bowel prep for your procedure, you may not have a normal bowel movement for a few days.  Please Note:  You might notice some irritation and congestion in your nose or some drainage.  This is from the oxygen used during your procedure.  There is no need for concern and it should clear up in a day or so.  SYMPTOMS TO REPORT IMMEDIATELY:    Following upper endoscopy (EGD)  Vomiting of blood or coffee ground material  New chest pain or pain under the shoulder blades  Painful or persistently difficult swallowing  New shortness of breath  Fever of 100F or higher  Black, tarry-looking stools  For urgent or emergent issues, a gastroenterologist can be reached at any hour by calling [phone].   DIET:  We do recommend a small meal at first, but then you may proceed to your regular diet.  Drink plenty of fluids but you should avoid alcoholic beverages for 24 hours.  ACTIVITY:  You should plan to take it easy for the rest of today and you should NOT DRIVE or use heavy machinery until  tomorrow (because of the sedation medicines used during the test).    FOLLOW UP: Our staff will call the number listed on your records the next business day following your procedure to check on you and address any questions or concerns that you may have regarding the information given to you following your procedure. If we do not reach you, we will leave a message.  However, if you are feeling well and you are not experiencing any problems, there is no need to return our call.  We will assume that you have returned to your regular daily activities without incident.  If any biopsies were taken you will be contacted by phone or by letter within the next 1-3 weeks.  Please call us at [phone] if you have not heard about the biopsies in 3 weeks.    SIGNATURES/CONFIDENTIALITY: You and/or your care partner have signed paperwork which will be entered into your electronic medical record.  These signatures attest to the fact that that the information above on your After Visit Summary has been reviewed and is understood.  Full responsibility of the confidentiality of this discharge information lies with you and/or your care-partner.

## 2017-11-22 NOTE — Progress Notes (Signed)
Called to room to assist during endoscopic procedure.  Patient ID and intended procedure confirmed with present staff. Received instructions for my participation in the procedure from the performing physician.  

## 2017-11-22 NOTE — Op Note (Signed)
[facility] Patient Name: Michelle Vasquez Procedure Date: 11/22/2017 9:10 AM MRN: [id number] Endoscopist: Mauri Pole , MD Age: 67 Referring MD:  Date of Birth: 19-Aug-1951 Gender: Female Account #: [id number] Procedure:                Upper GI endoscopy Indications:              Dysphagia Medicines:                Monitored Anesthesia Care Procedure:                Pre-Anesthesia Assessment:                           - Prior to the procedure, a History and Physical                            was performed, and patient medications and                            allergies were reviewed. The patient's tolerance of                            previous anesthesia was also reviewed. The risks                            and benefits of the procedure and the sedation                            options and risks were discussed with the patient.                            All questions were answered, and informed consent                            was obtained. Prior Anticoagulants: The patient has                            taken no previous anticoagulant or antiplatelet                            agents. ASA Grade Assessment: II - A patient with                            mild systemic disease. After reviewing the risks                            and benefits, the patient was deemed in                            satisfactory condition to undergo the procedure.                           After obtaining informed consent, the endoscope was  passed under direct vision. Throughout the                            procedure, the patient's blood pressure, pulse, and                            oxygen saturations were monitored continuously. The                            Endoscope was introduced through the mouth, and                            advanced to the second part of duodenum. The upper                            GI endoscopy was accomplished without  difficulty.                            The patient tolerated the procedure well. Scope In: Scope Out: Findings:                 One mild (non-circumferential scarring)                            benign-appearing, intrinsic stenosis was found 30                            to 31 cm from the incisors. This measured 1.6                            -1.8cm (inner diameter) x less than one cm (in                            length) and was traversed. A TTS dilator was passed                            through the scope. Dilation with an 18-19-20 mm                            balloon dilator was performed to 20 mm. The                            dilation site was examined following endoscope                            reinsertion and showed no change.                           Esophagogastric landmarks were identified: the                            Z-line was found at 35 cm , regular and the  gastroesophageal junction was found at 35 cm from                            the incisors.                           The stomach was normal.                           A large diverticulum was found in the second                            portion of the duodenum.                           The exam was otherwise without abnormality. Complications:            No immediate complications. Estimated Blood Loss:     Estimated blood loss: none. Impression:               - Benign-appearing esophageal stenosis. Dilated.                           - Esophagogastric landmarks identified.                           - Normal stomach.                           - Duodenal diverticulum.                           - The examination was otherwise normal.                           - No specimens collected. Recommendation:           - Patient has a contact number available for                            emergencies. The signs and symptoms of potential                            delayed complications  were discussed with the                            patient. Return to normal activities tomorrow.                            Written discharge instructions were provided to the                            patient.                           - Resume previous diet.                           - Continue  present medications.                           - Follow an antireflux regimen. Mauri Pole, MD 11/22/2017 9:39:36 AM This report has been signed electronically.

## 2017-11-23 ENCOUNTER — Encounter: Payer: Self-pay | Admitting: Family Medicine

## 2017-11-23 ENCOUNTER — Telehealth: Payer: Self-pay

## 2017-11-23 NOTE — Telephone Encounter (Signed)
  Follow up Call-  Call back number 11/22/2017 03/15/2015  Post procedure Call Back phone  # [phone]  Permission to leave phone message Yes Yes  Some recent data might be hidden     Patient questions:  Do you have a fever, pain , or abdominal swelling? No. Pain Score  0 *  Have you tolerated food without any problems? Yes.    Have you been able to return to your normal activities? Yes.    Do you have any questions about your discharge instructions: Diet   No. Medications  No. Follow up visit  No.  Do you have questions or concerns about your Care? No.  Actions: * If pain score is 4 or above: No action needed, pain <4.

## 2017-11-26 DIAGNOSIS — D518 Other vitamin B12 deficiency anemias: Secondary | ICD-10-CM | POA: Diagnosis not present

## 2017-12-01 ENCOUNTER — Other Ambulatory Visit: Payer: Self-pay | Admitting: Family Medicine

## 2017-12-01 DIAGNOSIS — Z1231 Encounter for screening mammogram for malignant neoplasm of breast: Secondary | ICD-10-CM

## 2017-12-02 ENCOUNTER — Other Ambulatory Visit: Payer: Self-pay | Admitting: Family Medicine

## 2017-12-02 DIAGNOSIS — K5010 Crohn's disease of large intestine without complications: Secondary | ICD-10-CM

## 2017-12-18 DIAGNOSIS — D518 Other vitamin B12 deficiency anemias: Secondary | ICD-10-CM | POA: Diagnosis not present

## 2018-01-11 ENCOUNTER — Ambulatory Visit (INDEPENDENT_AMBULATORY_CARE_PROVIDER_SITE_OTHER): Payer: Medicare HMO | Admitting: Family Medicine

## 2018-01-11 ENCOUNTER — Encounter: Payer: Self-pay | Admitting: Family Medicine

## 2018-01-11 ENCOUNTER — Other Ambulatory Visit: Payer: Self-pay

## 2018-01-11 VITALS — BP 100/60 | HR 104 | Temp 98.7°F | Ht 60.0 in | Wt 113.5 lb

## 2018-01-11 DIAGNOSIS — R6889 Other general symptoms and signs: Secondary | ICD-10-CM

## 2018-01-11 DIAGNOSIS — R000 Tachycardia, unspecified: Secondary | ICD-10-CM | POA: Insufficient documentation

## 2018-01-11 NOTE — Patient Instructions (Signed)
Push fluids, rest.  Mucinex DM  Twice a day.  Start flonase 2 spray per nostril daily.  Tylenol or ibuprofen for fever.  Call if increasing shortness of breath or not improving in 7-10 days of illness.

## 2018-01-11 NOTE — Assessment & Plan Note (Signed)
Likely due to mild dehydration from fever, sweating and decreased intake. Increase po intake.

## 2018-01-11 NOTE — Assessment & Plan Note (Signed)
Past time when tamiflu would be helpful. Symptomatic care. Rest and fluids.

## 2018-01-11 NOTE — Progress Notes (Signed)
   Subjective:    Patient ID: Michelle Vasquez, female    DOB: [birth year], 67 y.o.   MRN: [id number]  Fever   This is a new problem. The current episode started in the past 7 days ( day 4-5). The maximum temperature noted was 101 to 101.9 F. The temperature was taken using an oral thermometer. Associated symptoms include coughing and ear pain. Pertinent negatives include no sore throat or wheezing.  Cough  This is a new problem. The current episode started in the past 7 days. Associated symptoms include ear congestion, ear pain, a fever, myalgias, nasal congestion and shortness of breath. Pertinent negatives include no postnasal drip, sore throat or wheezing. Associated symptoms comments:  Facial pain. The symptoms are aggravated by lying down. Risk factors: former remote smoker. Treatments tried: zyrtec,  tylenol. The treatment provided mild relief. Her past medical history is significant for environmental allergies. There is no history of asthma or COPD.    Blood pressure 100/60, pulse (!) 104, temperature 98.7 F (37.1 C), temperature source Oral, height 5' (1.524 m), weight 113 lb 8 oz (51.5 kg), SpO2 97 %.   Review of Systems  Constitutional: Positive for fever.  HENT: Positive for ear pain. Negative for postnasal drip and sore throat.   Respiratory: Positive for cough and shortness of breath. Negative for wheezing.   Musculoskeletal: Positive for myalgias.  Allergic/Immunologic: Positive for environmental allergies.       Objective:   Physical Exam  Constitutional: Vital signs are normal. She appears well-developed and well-nourished. She is cooperative.  Non-toxic appearance. She does not appear ill. No distress.  HENT:  Head: Normocephalic.  Right Ear: Hearing, tympanic membrane, external ear and ear canal normal. Tympanic membrane is not erythematous, not retracted and not bulging.  Left Ear: Hearing, tympanic membrane, external ear and ear canal normal. Tympanic membrane is not  erythematous, not retracted and not bulging.  Nose: Mucosal edema and rhinorrhea present. Right sinus exhibits no maxillary sinus tenderness and no frontal sinus tenderness. Left sinus exhibits no maxillary sinus tenderness and no frontal sinus tenderness.  Mouth/Throat: Uvula is midline, oropharynx is clear and moist and mucous membranes are normal.  Eyes: Conjunctivae, EOM and lids are normal. Pupils are equal, round, and reactive to light. Lids are everted and swept, no foreign bodies found.  Neck: Trachea normal and normal range of motion. Neck supple. Carotid bruit is not present. No thyroid mass and no thyromegaly present.  Cardiovascular: Normal rate, regular rhythm, S1 normal, S2 normal, normal heart sounds, intact distal pulses and normal pulses. Exam reveals no gallop and no friction rub.  No murmur heard. Pulmonary/Chest: Effort normal and breath sounds normal. No tachypnea. No respiratory distress. She has no decreased breath sounds. She has no wheezes. She has no rhonchi. She has no rales.  Neurological: She is alert.  Skin: Skin is warm, dry and intact. No rash noted.  Psychiatric: Her speech is normal and behavior is normal. Judgment normal. Her mood appears not anxious. Cognition and memory are normal. She does not exhibit a depressed mood.          Assessment & Plan:

## 2018-01-14 ENCOUNTER — Other Ambulatory Visit: Payer: Medicare HMO

## 2018-01-14 ENCOUNTER — Ambulatory Visit: Payer: Medicare HMO

## 2018-01-17 DIAGNOSIS — D518 Other vitamin B12 deficiency anemias: Secondary | ICD-10-CM | POA: Diagnosis not present

## 2018-01-27 ENCOUNTER — Ambulatory Visit: Payer: Self-pay

## 2018-01-27 NOTE — Telephone Encounter (Signed)
Patient called in with c/o "weak, shaking to hands."  She says "when I pick up something or even holding on to the steering wheel, my hands shake and I have trouble holding on to things. This has been going on for a few years, but it's getting worse. So my children urged me to call to get it looked at."  I asked does it happen when she's not picking up things, she says "no, only when I pick up. I can pick up something as small as a pen, pencil and paper and my hands shake."  I asked are the hands numb or weak otherwise, she says "no."  I asked does she have weakness to her arms, legs, she says "no, but sometimes I have tingling up my arms, but not all the time."  I asked about other symptoms, she denies.  According to protocol, see PCP within 3 days, appointment made for Monday, 01/31/18 with Dr. Danise Mina, care advice given, patient verbalized understanding.  Reason for Disposition . [1] Numbness or tingling in one or both hands AND [2] is a chronic symptom (recurrent or ongoing AND present > 4 weeks)  Answer Assessment - Initial Assessment Questions 1. SYMPTOM: "What is the main symptom you are concerned about?" (e.g., weakness, numbness)     Hands shaking and weak when holding something 2. ONSET: "When did this start?" (minutes, hours, days; while sleeping)     A few years, but getting worse now 3. LAST NORMAL: "When was the last time you were normal (no symptoms)?"     Few years ago 4. PATTERN "Does this come and go, or has it been constant since it started?"  "Is it present now?"     Only happens when grips, pick something up 5. CARDIAC SYMPTOMS: "Have you had any of the following symptoms: chest pain, difficulty breathing, palpitations?"     No 6. NEUROLOGIC SYMPTOMS: "Have you had any of the following symptoms: headache, dizziness, vision loss, double vision, changes in speech, unsteady on your feet?"     Denies 7. OTHER SYMPTOMS: "Do you have any other symptoms?"     No 8. PREGNANCY: "Is  there any chance you are pregnant?" "When was your last menstrual period?"     No  Protocols used: NEUROLOGIC DEFICIT-A-AH

## 2018-01-31 ENCOUNTER — Ambulatory Visit (INDEPENDENT_AMBULATORY_CARE_PROVIDER_SITE_OTHER): Payer: Medicare HMO | Admitting: Family Medicine

## 2018-01-31 ENCOUNTER — Encounter: Payer: Self-pay | Admitting: Family Medicine

## 2018-01-31 VITALS — Temp 98.0°F | Wt 115.0 lb

## 2018-01-31 DIAGNOSIS — R202 Paresthesia of skin: Secondary | ICD-10-CM | POA: Insufficient documentation

## 2018-01-31 DIAGNOSIS — M791 Myalgia, unspecified site: Secondary | ICD-10-CM

## 2018-01-31 DIAGNOSIS — R251 Tremor, unspecified: Secondary | ICD-10-CM

## 2018-01-31 DIAGNOSIS — F329 Major depressive disorder, single episode, unspecified: Secondary | ICD-10-CM

## 2018-01-31 DIAGNOSIS — F419 Anxiety disorder, unspecified: Secondary | ICD-10-CM

## 2018-01-31 DIAGNOSIS — E538 Deficiency of other specified B group vitamins: Secondary | ICD-10-CM

## 2018-01-31 LAB — CBC WITH DIFFERENTIAL/PLATELET
BASOS PCT: 0.5 % (ref 0.0–3.0)
Basophils Absolute: 0 10*3/uL (ref 0.0–0.1)
EOS PCT: 1.4 % (ref 0.0–5.0)
Eosinophils Absolute: 0.1 10*3/uL (ref 0.0–0.7)
HEMATOCRIT: 35.4 % — AB (ref 36.0–46.0)
Hemoglobin: 11.7 g/dL — ABNORMAL LOW (ref 12.0–15.0)
LYMPHS ABS: 2.6 10*3/uL (ref 0.7–4.0)
LYMPHS PCT: 44.6 % (ref 12.0–46.0)
MCHC: 33.2 g/dL (ref 30.0–36.0)
MCV: 103 fl — AB (ref 78.0–100.0)
MONOS PCT: 8.8 % (ref 3.0–12.0)
Monocytes Absolute: 0.5 10*3/uL (ref 0.1–1.0)
NEUTROS ABS: 2.6 10*3/uL (ref 1.4–7.7)
NEUTROS PCT: 44.7 % (ref 43.0–77.0)
PLATELETS: 274 10*3/uL (ref 150.0–400.0)
RBC: 3.43 Mil/uL — ABNORMAL LOW (ref 3.87–5.11)
RDW: 16.6 % — AB (ref 11.5–15.5)
WBC: 5.8 10*3/uL (ref 4.0–10.5)

## 2018-01-31 LAB — CK: Total CK: 84 U/L (ref 7–177)

## 2018-01-31 LAB — TSH: TSH: 3.59 u[IU]/mL (ref 0.35–4.50)

## 2018-01-31 LAB — VITAMIN B12: Vitamin B-12: 761 pg/mL (ref 211–911)

## 2018-01-31 MED ORDER — PROPRANOLOL HCL 20 MG PO TABS: 20.0000 mg | ORAL_TABLET | Freq: Two times a day (BID) | ORAL | 1 refills | Status: DC

## 2018-01-31 MED ORDER — SERTRALINE HCL 50 MG PO TABS: ORAL_TABLET | ORAL | 6 refills | Status: DC

## 2018-01-31 NOTE — Assessment & Plan Note (Signed)
Stable period. Would like to try 68m dose - sent in.

## 2018-01-31 NOTE — Progress Notes (Signed)
Temp 98 F (36.7 C) (Oral)   Wt 115 lb (52.2 kg)   SpO2 96%   BMI 22.46 kg/m    CC: "I shake a lot" Subjective:    Patient ID: Michelle Vasquez, female    DOB: 08-29-1951, 67 y.o.   MRN: [id number]  HPI: Michelle Vasquez is a 67 y.o. female presenting on 01/31/2018 for Tremors (C/o tremors in bilateral hands, worse in right. Has had 1-2 yrs, worsened in last 6 mos. Also, c/o decreased grip strength in bilateral hands for a "long time". Pt accompanied by daughter, [place]. )   Several year h/o hand shaking R>L hands, progressively worsening last few months. More noticeable when driving or eating - holding fork, coffee cup. Sometimes really bad. 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 Friday had trouble typing due to tremor - this was new. Notices progressive weakness of arms and grip strength. Some paresthesias and numbness of hands into elbows - this is somewhat bothersome.   Endorses frequent unsteadiness and lightheaded with sudden position changes.  Some short term memory lapses last few months - forgets recent conversations.  Normal gait. No significant urinary incontinence.  Sense of smell limited due to allergies. Diminished sense of taste.   No new medicines.  No known fmhx tremors or dementia.   Vit B12 deficiency - repleting with b12 shots Q2 wks because monthly was not maintaining levels.   Relevant past medical, surgical, family and social history reviewed and updated as indicated. Interim medical history since our last visit reviewed. Allergies and medications reviewed and updated. Outpatient Medications Prior to Visit  Medication Sig Dispense Refill  . amLODipine (NORVASC) 5 MG tablet Take 1 tablet (5 mg total) by mouth daily. 30 tablet 11  . cetirizine (ZYRTEC) 10 MG tablet TAKE ONE TABLET DAILY AS DIRECTED 100 tablet 3  . cyanocobalamin (,VITAMIN B-12,) 1000 MCG/ML injection Inject 1 ML every 2 weeks 60 mL 3  . folic acid (FOLVITE) 1 MG tablet Take 1  tablet (1 mg total) by mouth daily. 90 tablet 3  . linaclotide (LINZESS) 72 MCG capsule Take 1 capsule (72 mcg total) by mouth daily before breakfast. 90 capsule 3  . mercaptopurine (PURINETHOL) 50 MG tablet TAKE 1 TABLET BY MOUTH DAILY 1 HOUR BEFORE OR 2 HOURS AFTER MEALS ON AN EMPTY STOMACH. 90 tablet 3  . omeprazole (PRILOSEC) 40 MG capsule Take 1 capsule (40 mg total) by mouth daily. 90 capsule 3  . Syringe, Disposable, (BD TUBERCULIN SYRINGE) 1 ML MISC 6 syringes for 1 month for B12 injections twice a month 6 each 6  . sertraline (ZOLOFT) 100 MG tablet Take one tablet daily 30 tablet 11   Facility-Administered Medications Prior to Visit  Medication Dose Route Frequency Provider Last Rate Last Dose  . cyanocobalamin ((VITAMIN B-12)) injection 1,000 mcg  1,000 mcg Intramuscular Q30 days [name], MD   1,000 mcg at 01/19/17 1524     Per HPI unless specifically indicated in ROS section below Review of Systems     Objective:    Temp 98 F (36.7 C) (Oral)   Wt 115 lb (52.2 kg)   SpO2 96%   BMI 22.46 kg/m   Wt Readings from Last 3 Encounters:  01/31/18 115 lb (52.2 kg)  01/11/18 113 lb 8 oz (51.5 kg)  11/22/17 115 lb (52.2 kg)    Physical Exam  Constitutional: She appears well-developed and well-nourished. No distress.  HENT:  Head: Normocephalic and atraumatic.  Mouth/Throat: Oropharynx is  clear and moist. No oropharyngeal exudate.  Eyes: Pupils are equal, round, and reactive to light.  Neck: Normal range of motion. Neck supple.  Cardiovascular: Normal rate, regular rhythm, normal heart sounds and intact distal pulses.  No murmur heard. Pulmonary/Chest: Effort normal and breath sounds normal. No respiratory distress. She has no wheezes. She has no rales.  Musculoskeletal: She exhibits no edema.  Tender to palpation bilateral upper arms   Neurological: She is alert. She has normal strength. No cranial nerve deficit or sensory deficit. She displays a negative Romberg  sign. Coordination and gait normal.  CN 2-12 intact 5/5 strength BUE and BLE Grip strength intact Mildly positive tinel and phalen FTN intact No pronator drift Evident postural action tremor R>L hands  Skin: Skin is warm and dry. No rash noted.  Psychiatric: She has a normal mood and affect.  Nursing note and vitals reviewed.  Results for orders placed or performed in visit on 07/21/17  CBC w/Diff  Result Value Ref Range   WBC 5.9 4.0 - 10.5 K/uL   RBC 3.60 (L) 3.87 - 5.11 Mil/uL   Hemoglobin 12.0 12.0 - 15.0 g/dL   HCT 36.4 36.0 - 46.0 %   MCV 100.9 (H) 78.0 - 100.0 fl   MCHC 33.1 30.0 - 36.0 g/dL   RDW 15.7 (H) 11.5 - 15.5 %   Platelets 284.0 150.0 - 400.0 K/uL   Neutrophils Relative % 46.5 43.0 - 77.0 %   Lymphocytes Relative 43.9 12.0 - 46.0 %   Monocytes Relative 6.9 3.0 - 12.0 %   Eosinophils Relative 2.0 0.0 - 5.0 %   Basophils Relative 0.7 0.0 - 3.0 %   Neutro Abs 2.8 1.4 - 7.7 K/uL   Lymphs Abs 2.6 0.7 - 4.0 K/uL   Monocytes Absolute 0.4 0.1 - 1.0 K/uL   Eosinophils Absolute 0.1 0.0 - 0.7 K/uL   Basophils Absolute 0.0 0.0 - 0.1 K/uL  Comp Met (CMET)  Result Value Ref Range   Sodium 138 135 - 145 mEq/L   Potassium 4.3 3.5 - 5.1 mEq/L   Chloride 101 96 - 112 mEq/L   CO2 30 19 - 32 mEq/L   Glucose, Bld 89 70 - 99 mg/dL   BUN 12 6 - 23 mg/dL   Creatinine, Ser 0.80 0.40 - 1.20 mg/dL   Total Bilirubin 0.6 0.2 - 1.2 mg/dL   Alkaline Phosphatase 74 39 - 117 U/L   AST 25 0 - 37 U/L   ALT 22 0 - 35 U/L   Total Protein 7.6 6.0 - 8.3 g/dL   Albumin 4.4 3.5 - 5.2 g/dL   Calcium 9.7 8.4 - 10.5 mg/dL   GFR 76.06 >60.00 mL/min  CRP High sensitivity  Result Value Ref Range   CRP, High Sensitivity 0.310 0.000 - 5.000 mg/L  Ferritin  Result Value Ref Range   Ferritin 39.8 10.0 - 291.0 ng/mL  B12  Result Value Ref Range   Vitamin B-12 280 211 - 911 pg/mL  Folate  Result Value Ref Range   Folate >23.9 >5.9 ng/mL      Assessment & Plan:  Over 25 minutes were spent  face-to-face with the patient during this encounter and >50% of that time was spent on counseling and coordination of care  Problem List Items Addressed This Visit    Anxiety and depression    Stable period. Would like to try 79m dose - sent in.       Relevant Medications   sertraline (ZOLOFT) 50  MG tablet   Paresthesia of hand, bilateral    Possible CTS, possible B12 deficiency related. Check b12 level today. Consider wrist brace use.       Tremor - Primary    Postural action tremor. Seems most consistent with essential tremor. Discussed with patient. She is interested in treatment - will send in propranolol trial. Update with effect. Advised watching blood pressure on additional antihypertensive, may need to decrease amlodipine dose.  Will also decrease sertraline dose to gauge effect.       Relevant Orders   TSH   CBC with Differential/Platelet   Vitamin B12 deficiency    Ordered as Q2 wks however has missed some last few months due to illness. This could contribute to some of her paresthesias - update B12 level today.       Relevant Orders   Vitamin B12    Other Visit Diagnoses    Muscle pain       Relevant Orders   CK       Meds ordered this encounter  Medications  . sertraline (ZOLOFT) 50 MG tablet    Sig: Take one tablet daily    Dispense:  30 tablet    Refill:  6  . propranolol (INDERAL) 20 MG tablet    Sig: Take 1 tablet (20 mg total) by mouth 2 (two) times daily. For tremor    Dispense:  60 tablet    Refill:  1   Orders Placed This Encounter  Procedures  . Vitamin B12  . TSH  . CBC with Differential/Platelet  . CK    Follow up plan: Return if symptoms worsen or fail to improve.  [name], MD

## 2018-01-31 NOTE — Assessment & Plan Note (Signed)
Ordered as Q2 wks however has missed some last few months due to illness. This could contribute to some of her paresthesias - update B12 level today.

## 2018-01-31 NOTE — Patient Instructions (Addendum)
Try lower sertraline dose - 65m sent to pharmacy.  Try 1/2 tablet of amlodipine if any lightheadedness or unsteadiness. Continue to stay well hydrated.  Labs today to check on muscle function and weakness.  Continue b12 shots every 2 weeks.  Possible essential tremor - try propranolol low dose sent to pharmacy.  Update me with how you do on new medicines.   Essential Tremor A tremor is trembling or shaking that you cannot control. Most tremors affect the hands or arms. Tremors can also affect the head, vocal cords, face, and other parts of the body. Essential tremor is a tremor without a known cause. What are the causes? Essential tremor has no known cause. What increases the risk? You may be at greater risk of essential tremor if:  You have a family member with essential tremor.  You are age 5528or older.  You take certain medicines.  What are the signs or symptoms? The main sign of a tremor is uncontrolled and unintentional rhythmic shaking of a body part.  You may have difficulty eating with a spoon or fork.  You may have difficulty writing.  You may nod your head up and down or side to side.  You may have a quivering voice.  Your tremors:  May get worse over time.  May come and go.  May be more noticeable on one side of your body.  May get worse due to stress, fatigue, caffeine, and extreme heat or cold.  How is this diagnosed? Your health care provider can diagnose essential tremor based on your symptoms, medical history, and a physical examination. There is no single test to diagnose an essential tremor. However, your health care provider may perform a variety of tests to rule out other conditions. Tests may include:  Blood and urine tests.  Imaging studies of your brain, such as: ? CT scan. ? MRI.  A test that measures involuntary muscle movement (electromyogram).  How is this treated? Your tremors may go away without treatment. Mild tremors may not need  treatment if they do not affect your day-to-day life. Severe tremors may need to be treated using one or a combination of the following options:  Medicines. This may include medicine that is injected.  Lifestyle changes.  Physical therapy.  Follow these instructions at home:  Take medicines only as directed by your health care provider.  Limit alcohol intake to no more than 1 drink per day for nonpregnant women and 2 drinks per day for men. One drink equals 12 oz of beer, 5 oz of wine, or 1 oz of hard liquor.  Do not use any tobacco products, including cigarettes, chewing tobacco, or electronic cigarettes. If you need help quitting, ask your health care provider.  Take medicines only as directed by your health care provider.  Avoid extreme heat or cold.  Limit the amount of caffeine you consumeas directed by your health care provider.  Try to get eight hours of sleep each night.  Find ways to manage your stress, such as meditation or yoga.  Keep all follow-up visits as directed by your health care provider. This is important. This includes any physical therapy visits. Contact a health care provider if:  You experience any changes in the location or intensity of your tremors.  You start having a tremor after starting a new medicine.  You have tremor with other symptoms such as: ? Numbness. ? Tingling. ? Pain. ? Weakness.  Your tremor gets worse.  Your tremor interferes  with your daily life. This information is not intended to replace advice given to you by your health care provider. Make sure you discuss any questions you have with your health care provider. Document Released: 11/02/2014 Document Revised: 03/19/2016 Document Reviewed: 04/09/2014 Elsevier Interactive Patient Education  [employer].

## 2018-01-31 NOTE — Assessment & Plan Note (Addendum)
Postural action tremor. Seems most consistent with essential tremor. Discussed with patient. She is interested in treatment - will send in propranolol trial. Update with effect. Advised watching blood pressure on additional antihypertensive, may need to decrease amlodipine dose.  Will also decrease sertraline dose to gauge effect.

## 2018-01-31 NOTE — Assessment & Plan Note (Signed)
Possible CTS, possible B12 deficiency related. Check b12 level today. Consider wrist brace use.

## 2018-02-07 ENCOUNTER — Ambulatory Visit: Payer: Medicare HMO

## 2018-02-07 ENCOUNTER — Other Ambulatory Visit: Payer: Medicare HMO

## 2018-03-07 ENCOUNTER — Ambulatory Visit
Admission: RE | Admit: 2018-03-07 | Discharge: 2018-03-07 | Disposition: A | Discharge status: Home / Self Care | Payer: Medicare HMO | Source: Ambulatory Visit | Attending: Family Medicine | Admitting: Family Medicine

## 2018-03-07 DIAGNOSIS — Z1231 Encounter for screening mammogram for malignant neoplasm of breast: Secondary | ICD-10-CM

## 2018-03-07 DIAGNOSIS — M8589 Other specified disorders of bone density and structure, multiple sites: Secondary | ICD-10-CM | POA: Diagnosis not present

## 2018-03-07 DIAGNOSIS — Z780 Asymptomatic menopausal state: Secondary | ICD-10-CM | POA: Diagnosis not present

## 2018-03-07 DIAGNOSIS — K5010 Crohn's disease of large intestine without complications: Secondary | ICD-10-CM

## 2018-03-07 LAB — HM MAMMOGRAPHY

## 2018-03-09 ENCOUNTER — Encounter: Payer: Self-pay | Admitting: Family Medicine

## 2018-03-13 ENCOUNTER — Encounter: Payer: Self-pay | Admitting: Family Medicine

## 2018-03-13 DIAGNOSIS — M810 Age-related osteoporosis without current pathological fracture: Secondary | ICD-10-CM | POA: Insufficient documentation

## 2018-03-13 DIAGNOSIS — M8580 Other specified disorders of bone density and structure, unspecified site: Secondary | ICD-10-CM | POA: Insufficient documentation

## 2018-03-22 DIAGNOSIS — H2513 Age-related nuclear cataract, bilateral: Secondary | ICD-10-CM | POA: Diagnosis not present

## 2018-03-22 DIAGNOSIS — H524 Presbyopia: Secondary | ICD-10-CM | POA: Diagnosis not present

## 2018-03-22 DIAGNOSIS — H52213 Irregular astigmatism, bilateral: Secondary | ICD-10-CM | POA: Diagnosis not present

## 2018-03-22 DIAGNOSIS — H16223 Keratoconjunctivitis sicca, not specified as Sjogren's, bilateral: Secondary | ICD-10-CM | POA: Diagnosis not present

## 2018-03-22 DIAGNOSIS — H04123 Dry eye syndrome of bilateral lacrimal glands: Secondary | ICD-10-CM | POA: Diagnosis not present

## 2018-03-22 DIAGNOSIS — H25033 Anterior subcapsular polar age-related cataract, bilateral: Secondary | ICD-10-CM | POA: Diagnosis not present

## 2018-04-01 ENCOUNTER — Encounter: Payer: Self-pay | Admitting: Gastroenterology

## 2018-06-22 ENCOUNTER — Ambulatory Visit: Payer: Medicare HMO | Admitting: Gastroenterology

## 2018-06-22 ENCOUNTER — Encounter: Payer: Self-pay | Admitting: Gastroenterology

## 2018-06-22 ENCOUNTER — Other Ambulatory Visit (INDEPENDENT_AMBULATORY_CARE_PROVIDER_SITE_OTHER): Payer: Medicare HMO

## 2018-06-22 VITALS — BP 112/72 | HR 68 | Ht 60.0 in | Wt 113.0 lb

## 2018-06-22 DIAGNOSIS — K5080 Crohn's disease of both small and large intestine without complications: Secondary | ICD-10-CM

## 2018-06-22 LAB — CBC WITH DIFFERENTIAL/PLATELET
Basophils Absolute: 0 10*3/uL (ref 0.0–0.1)
Basophils Relative: 0.5 % (ref 0.0–3.0)
Eosinophils Absolute: 0.1 10*3/uL (ref 0.0–0.7)
Eosinophils Relative: 1 % (ref 0.0–5.0)
HCT: 35.2 % — ABNORMAL LOW (ref 36.0–46.0)
HEMOGLOBIN: 12 g/dL (ref 12.0–15.0)
LYMPHS ABS: 2.7 10*3/uL (ref 0.7–4.0)
Lymphocytes Relative: 37.4 % (ref 12.0–46.0)
MCHC: 34 g/dL (ref 30.0–36.0)
MCV: 99.3 fl (ref 78.0–100.0)
MONO ABS: 0.4 10*3/uL (ref 0.1–1.0)
Monocytes Relative: 5.8 % (ref 3.0–12.0)
NEUTROS PCT: 55.3 % (ref 43.0–77.0)
Neutro Abs: 4 10*3/uL (ref 1.4–7.7)
Platelets: 276 10*3/uL (ref 150.0–400.0)
RBC: 3.54 Mil/uL — AB (ref 3.87–5.11)
RDW: 16.4 % — ABNORMAL HIGH (ref 11.5–15.5)
WBC: 7.3 10*3/uL (ref 4.0–10.5)

## 2018-06-22 LAB — COMPREHENSIVE METABOLIC PANEL
ALBUMIN: 4.3 g/dL (ref 3.5–5.2)
ALK PHOS: 87 U/L (ref 39–117)
ALT: 36 U/L — ABNORMAL HIGH (ref 0–35)
AST: 30 U/L (ref 0–37)
BUN: 9 mg/dL (ref 6–23)
CO2: 26 mEq/L (ref 19–32)
Calcium: 9.6 mg/dL (ref 8.4–10.5)
Chloride: 105 mEq/L (ref 96–112)
Creatinine, Ser: 0.83 mg/dL (ref 0.40–1.20)
GFR: 72.7 mL/min (ref >60.00)
GLUCOSE: 91 mg/dL (ref 70–99)
POTASSIUM: 4.4 meq/L (ref 3.5–5.1)
SODIUM: 140 meq/L (ref 135–145)
Total Bilirubin: 0.8 mg/dL (ref 0.2–1.2)
Total Protein: 7.8 g/dL (ref 6.0–8.3)

## 2018-06-22 LAB — FERRITIN: Ferritin: 21.1 ng/mL (ref 10.0–291.0)

## 2018-06-22 LAB — FOLATE: Folate: 20.1 ng/mL (ref >5.9)

## 2018-06-22 LAB — VITAMIN B12

## 2018-06-22 NOTE — Patient Instructions (Signed)
Go to the basement for labs today  Follow up in 6 months   If you are age 68 or older, your body mass index should be between 23-30. Your Body mass index is 22.07 kg/m. If this is out of the aforementioned range listed, please consider follow up with your Primary Care Provider.  If you are age 69 or younger, your body mass index should be between 19-25. Your Body mass index is 22.07 kg/m. If this is out of the aformentioned range listed, please consider follow up with your Primary Care Provider.    Thank you for choosing [facility]  Karleen Hampshire Nandigam,MD

## 2018-06-22 NOTE — Progress Notes (Signed)
Michelle Vasquez    [id number]    1951/10/17  Primary Care Physician:Gutierrez, [name], MD  Referring Physician: [name], MD [place], [place] [address]  Chief complaint:  Crohn's disease  HPI:  67 year old female with history of Crohn's disease here for follow-up visit. Status post terminal ileum resection in 1981, last colonoscopy May 2016 with evidence of ileocolonic stricture to barium study showed normal flow with no significant delay.  She is in clinical remission on low-dose 6-MP 50 mg daily.  She is currently taking B12 injections every 2 weeks.  Status post EGD with esophageal dilation January 2019 with improvement of dysphagia. Denies any nausea, vomiting, abdominal pain, melena or bright red blood per rectum.  No loss of appetite or weight loss.   Outpatient Encounter Medications as of 06/22/2018  Medication Sig  . amLODipine (NORVASC) 5 MG tablet Take 1 tablet (5 mg total) by mouth daily.  . cetirizine (ZYRTEC) 10 MG tablet TAKE ONE TABLET DAILY AS DIRECTED  . cyanocobalamin (,VITAMIN B-12,) 1000 MCG/ML injection Inject 1 ML every 2 weeks  . mercaptopurine (PURINETHOL) 50 MG tablet TAKE 1 TABLET BY MOUTH DAILY 1 HOUR BEFORE OR 2 HOURS AFTER MEALS ON AN EMPTY STOMACH.  Marland Kitchen omeprazole (PRILOSEC) 40 MG capsule Take 1 capsule (40 mg total) by mouth daily.  . Syringe, Disposable, (BD TUBERCULIN SYRINGE) 1 ML MISC 6 syringes for 1 month for B12 injections twice a month  . [DISCONTINUED] folic acid (FOLVITE) 1 MG tablet Take 1 tablet (1 mg total) by mouth daily.  . [DISCONTINUED] linaclotide (LINZESS) 72 MCG capsule Take 1 capsule (72 mcg total) by mouth daily before breakfast.  . [DISCONTINUED] propranolol (INDERAL) 20 MG tablet Take 1 tablet (20 mg total) by mouth 2 (two) times daily. For tremor  . [DISCONTINUED] sertraline (ZOLOFT) 50 MG tablet Take one tablet daily   Facility-Administered Encounter Medications as of 06/22/2018    Medication  . cyanocobalamin ((VITAMIN B-12)) injection 1,000 mcg    Allergies as of 06/22/2018 - Review Complete 06/22/2018  Allergen Reaction Noted  . Celexa [citalopram hydrobromide] Other (See Comments) 01/10/2014    Past Medical History:  Diagnosis Date  . Allergy    seasonal  . Anxiety and depression 07/03/2011  . B12 deficiency   . Blood transfusion without reported diagnosis   . Central hearing loss   . Crohn disease ([place])    s/p colectomy on 6MP, ?bacterial overgrowth  . Fatty liver 12/15/11  . History of small bowel obstruction   . HTN (hypertension)   . Internal hemorrhoids without mention of complication   . Osteomalacia, unspecified   . Perennial allergic rhinitis with seasonal variation   . Unspecified deficiency anemia   . Unspecified polyarthropathy or polyarthritis, multiple sites     Past Surgical History:  Procedure Laterality Date  . APPENDECTOMY    . COLONOSCOPY  02/25/05   Internal hemms; crohns; narrow anast  . COLONOSCOPY  02/26/10   Prior right hemicolectomy o/w benign (Dr. Olevia Perches)  . COLONOSCOPY  02/2015   benign biopsies, crohn's with stenotic stricture at anastomosis Olevia Perches)  . DEXA  07/06/2011   normal T score -1.0 femur, spine  . ESOPHAGOGASTRODUODENOSCOPY  02/25/05   Dilated stricture  . ESOPHAGOGASTRODUODENOSCOPY  10/2017   esophageal stenosis dilated, duodenal diverticuluc (Nandigam)  . HEMICOLECTOMY  1980   all small intestine, none large  . OTHER SURGICAL HISTORY  1960s, 1978, 57   ileal  resection x 3 and subtotal colon resection  . TOTAL ABDOMINAL HYSTERECTOMY  1975   ovaries out as well  . UPPER GASTROINTESTINAL ENDOSCOPY      Family History  Problem Relation Age of Onset  . Crohn's disease Father        colostomy  . Hypertension Mother   . Heart disease Mother 32  . Alcohol abuse Brother   . Cirrhosis Brother   . Crohn's disease Sister   . Crohn's disease Paternal Aunt        x 3  . Crohn's disease Unknown        neice x  2  . Colon cancer Neg Hx   . Cancer Neg Hx   . Diabetes Neg Hx   . Rectal cancer Neg Hx   . Stomach cancer Neg Hx   . Esophageal cancer Neg Hx   . Breast cancer Neg Hx     Social History   Socioeconomic History  . Marital status: Divorced    Spouse name: Not on file  . Number of children: 3  . Years of education: Not on file  . Highest education level: Not on file  Occupational History  . Occupation: Medical Transcription    Employer: [employer]  . Financial resource strain: Not on file  . Food insecurity:    Worry: Not on file    Inability: Not on file  . Transportation needs:    Medical: Not on file    Non-medical: Not on file  Tobacco Use  . Smoking status: Former Smoker    Packs/day: 0.25    Years: 15.00    Pack years: 3.75    Types: Cigarettes    Last attempt to quit: 10/26/1978    Years since quitting: 39.6  . Smokeless tobacco: Never Used  Substance and Sexual Activity  . Alcohol use: No  . Drug use: No  . Sexual activity: Not on file  Lifestyle  . Physical activity:    Days per week: Not on file    Minutes per session: Not on file  . Stress: Not on file  Relationships  . Social connections:    Talks on phone: Not on file    Gets together: Not on file    Attends religious service: Not on file    Active member of club or organization: Not on file    Attends meetings of clubs or organizations: Not on file    Relationship status: Not on file  . Intimate partner violence:    Fear of current or ex partner: Not on file    Emotionally abused: Not on file    Physically abused: Not on file    Forced sexual activity: Not on file  Other Topics Concern  . 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  HENT: Negative.   Eyes: Negative for blurred vision.  Respiratory: Negative for cough, shortness of breath and wheezing.   Cardiovascular: Negative for chest pain and palpitations.  Gastrointestinal: as per HPI Genitourinary: Negative for dysuria, urgency, frequency and hematuria.  Musculoskeletal: Negative for myalgias, back pain and joint pain.  Skin: Negative for  itching and rash.  Neurological: Negative for dizziness, tremors, focal weakness, seizures and loss of consciousness.  Endo/Heme/Allergies: Positive for seasonal allergies.  Psychiatric/Behavioral: Negative for depression, suicidal ideas and hallucinations.  All other systems reviewed and are negative.   Physical Exam: Vitals:   06/22/18 1348  BP: 112/72  Pulse: 68   Body mass index is 22.07 kg/m. Gen:      No acute distress HEENT:  EOMI, sclera anicteric Neck:     No masses; no thyromegaly Lungs:    Clear to auscultation bilaterally; normal respiratory effort CV:         Regular rate and rhythm; no murmurs Abd:      + bowel sounds; soft, non-tender; no palpable masses, no distension Ext:    No edema; adequate peripheral perfusion Skin:      Warm and dry; no rash Neuro: alert and oriented x 3 Psych: normal mood and affect  Data Reviewed:  Reviewed labs, radiology imaging, old records and pertinent past GI work up   Assessment and Plan/Recommendations:  67 year old female with long-standing history of Crohn's disease status post terminal ileum resection in 1981, and clinical remission on low-dose 6-MP 50 mg daily and chronic GERD here for follow-up visit Last colonoscopy 2016, was recommended surveillance colonoscopy in 5 years due May 2020 Continue 6-MP 50 mg daily Follow-up CBC, CMP, B12, folate, iron panel and CRP Up-to-date with DEXA scan and dental exam Advised patient annual skin exam with dermatology given she is on chronic immunosuppressive  therapy/6-MP Return in 6 months or sooner if needed  Greater than 50% of the time used for counseling as well as treatment plan and follow-up. She had multiple questions which were answered to her satisfaction  K. Denzil Magnuson , MD [phone]    CC: [name], MD

## 2018-06-28 ENCOUNTER — Encounter: Payer: Self-pay | Admitting: Gastroenterology

## 2018-06-28 ENCOUNTER — Ambulatory Visit: Payer: Medicare HMO | Admitting: Family Medicine

## 2018-06-29 ENCOUNTER — Other Ambulatory Visit: Payer: Self-pay

## 2018-06-29 DIAGNOSIS — K5080 Crohn's disease of both small and large intestine without complications: Secondary | ICD-10-CM

## 2018-06-29 DIAGNOSIS — E538 Deficiency of other specified B group vitamins: Secondary | ICD-10-CM

## 2018-07-25 ENCOUNTER — Telehealth: Payer: Self-pay | Admitting: Gastroenterology

## 2018-07-25 MED ORDER — OMEPRAZOLE 40 MG PO CPDR: 40.0000 mg | DELAYED_RELEASE_CAPSULE | Freq: Every day | ORAL | 3 refills | Status: DC

## 2018-07-25 MED ORDER — MERCAPTOPURINE 50 MG PO TABS: ORAL_TABLET | ORAL | 3 refills | Status: DC

## 2018-07-25 NOTE — Telephone Encounter (Signed)
Omeprazole and 6MP sent to [facility] today

## 2018-09-01 ENCOUNTER — Ambulatory Visit (INDEPENDENT_AMBULATORY_CARE_PROVIDER_SITE_OTHER): Payer: Medicare HMO

## 2018-09-01 DIAGNOSIS — Z23 Encounter for immunization: Secondary | ICD-10-CM

## 2018-09-16 ENCOUNTER — Other Ambulatory Visit: Payer: Self-pay

## 2018-09-16 MED ORDER — AMLODIPINE BESYLATE 5 MG PO TABS: 5.0000 mg | ORAL_TABLET | Freq: Every day | ORAL | 0 refills | Status: DC

## 2018-09-16 MED ORDER — OMEPRAZOLE 40 MG PO CPDR: 40.0000 mg | DELAYED_RELEASE_CAPSULE | Freq: Every day | ORAL | 0 refills | Status: DC

## 2018-09-16 NOTE — Telephone Encounter (Signed)
E-scribed refills to [facility] order

## 2018-11-11 ENCOUNTER — Ambulatory Visit (INDEPENDENT_AMBULATORY_CARE_PROVIDER_SITE_OTHER): Payer: Medicare HMO

## 2018-11-11 ENCOUNTER — Ambulatory Visit: Payer: Medicare Other

## 2018-11-11 VITALS — BP 122/60 | HR 82 | Temp 98.4°F | Ht 61.0 in | Wt 113.0 lb

## 2018-11-11 DIAGNOSIS — M8580 Other specified disorders of bone density and structure, unspecified site: Secondary | ICD-10-CM

## 2018-11-11 DIAGNOSIS — I10 Essential (primary) hypertension: Secondary | ICD-10-CM

## 2018-11-11 DIAGNOSIS — D508 Other iron deficiency anemias: Secondary | ICD-10-CM | POA: Diagnosis not present

## 2018-11-11 DIAGNOSIS — E538 Deficiency of other specified B group vitamins: Secondary | ICD-10-CM

## 2018-11-11 DIAGNOSIS — Z0000 Encounter for general adult medical examination without abnormal findings: Secondary | ICD-10-CM | POA: Diagnosis not present

## 2018-11-11 LAB — CBC WITH DIFFERENTIAL/PLATELET
Basophils Absolute: 0 10*3/uL (ref 0.0–0.1)
Basophils Relative: 0.5 % (ref 0.0–3.0)
EOS ABS: 0.1 10*3/uL (ref 0.0–0.7)
Eosinophils Relative: 1.1 % (ref 0.0–5.0)
HCT: 35.8 % — ABNORMAL LOW (ref 36.0–46.0)
HEMOGLOBIN: 12.1 g/dL (ref 12.0–15.0)
LYMPHS ABS: 2.5 10*3/uL (ref 0.7–4.0)
Lymphocytes Relative: 39.4 % (ref 12.0–46.0)
MCHC: 33.7 g/dL (ref 30.0–36.0)
MCV: 98.6 fl (ref 78.0–100.0)
MONO ABS: 0.5 10*3/uL (ref 0.1–1.0)
Monocytes Relative: 7.1 % (ref 3.0–12.0)
Neutro Abs: 3.3 10*3/uL (ref 1.4–7.7)
Neutrophils Relative %: 51.9 % (ref 43.0–77.0)
Platelets: 273 10*3/uL (ref 150.0–400.0)
RBC: 3.64 Mil/uL — AB (ref 3.87–5.11)
RDW: 16.2 % — AB (ref 11.5–15.5)
WBC: 6.3 10*3/uL (ref 4.0–10.5)

## 2018-11-11 LAB — IBC PANEL
Iron: 99 ug/dL (ref 42–145)
Saturation Ratios: 18.6 % — ABNORMAL LOW (ref 20.0–50.0)
Transferrin: 381 mg/dL — ABNORMAL HIGH (ref 212.0–360.0)

## 2018-11-11 LAB — COMPREHENSIVE METABOLIC PANEL
ALBUMIN: 4.4 g/dL (ref 3.5–5.2)
ALT: 26 U/L (ref 0–35)
AST: 28 U/L (ref 0–37)
Alkaline Phosphatase: 76 U/L (ref 39–117)
BUN: 11 mg/dL (ref 6–23)
CO2: 27 meq/L (ref 19–32)
Calcium: 9.8 mg/dL (ref 8.4–10.5)
Chloride: 104 mEq/L (ref 96–112)
Creatinine, Ser: 0.79 mg/dL (ref 0.40–1.20)
GFR: 72.33 mL/min (ref >60.00)
Glucose, Bld: 86 mg/dL (ref 70–99)
POTASSIUM: 4.6 meq/L (ref 3.5–5.1)
SODIUM: 140 meq/L (ref 135–145)
Total Bilirubin: 0.8 mg/dL (ref 0.2–1.2)
Total Protein: 7.9 g/dL (ref 6.0–8.3)

## 2018-11-11 LAB — VITAMIN B12: VITAMIN B 12: 247 pg/mL (ref 211–911)

## 2018-11-11 LAB — VITAMIN D 25 HYDROXY (VIT D DEFICIENCY, FRACTURES): VITD: 17.96 ng/mL — AB (ref 30.00–100.00)

## 2018-11-11 LAB — FERRITIN: Ferritin: 13.3 ng/mL (ref 10.0–291.0)

## 2018-11-11 NOTE — Progress Notes (Signed)
Subjective:   Michelle Vasquez is a 68 y.o. female who presents for Medicare Annual (Subsequent) preventive examination.  Review of Systems:  N/A Cardiac Risk Factors include: advanced age (>98mn, >>40women)     Objective:     Vitals: BP 122/60 (BP Location: Right Arm, Patient Position: Sitting, Cuff Size: Normal)   Pulse 82   Temp 98.4 F (36.9 C) (Oral)   Ht 5' 1"  (1.549 m) Comment: shoes  Wt 113 lb (51.3 kg)   SpO2 98%   BMI 21.35 kg/m   Body mass index is 21.35 kg/m.  Advanced Directives 11/11/2018 03/15/2015 03/01/2015 01/16/2013  Does Patient Have a Medical Advance Directive? No No No Patient does not have advance directive;Patient would like information  Would patient like information on creating a medical advance directive? Yes (MAU/Ambulatory/Procedural Areas - Information given) - - Referral made to social work  Pre-existing out of facility DNR order (yellow form or pink MOST form) - - - No    Tobacco Social History   Tobacco Use  Smoking Status Former Smoker  . Packs/day: 0.25  . Years: 15.00  . Pack years: 3.75  . Types: Cigarettes  . Last attempt to quit: 10/26/1978  . Years since quitting: 40.0  Smokeless Tobacco Never Used     Counseling given: No   Clinical Intake:  Pre-visit preparation completed: Yes  Pain : No/denies pain Pain Score: 0-No pain     Nutritional Status: BMI of 19-24  Normal Nutritional Risks: None Diabetes: No  How often do you need to have someone help you when you read instructions, pamphlets, or other written materials from your doctor or pharmacy?: 1 - Never What is the last grade level you completed in school?: 12th grade  Interpreter Needed?: No  Comments: pt and daughter live together Information entered by :: LPinson, LPN  Past Medical History:  Diagnosis Date  . Allergy    seasonal  . Anxiety and depression 07/03/2011  . B12 deficiency   . Blood transfusion without reported diagnosis   . [place] hearing loss    [place]    s/p colectomy on 6MP, ?bacterial overgrowth  . Fatty liver 12/15/11  . History of small bowel obstruction   . HTN (hypertension)   . Internal hemorrhoids without mention of complication   . Osteomalacia, unspecified   . Perennial allergic rhinitis with seasonal variation   . Unspecified deficiency anemia   . Unspecified polyarthropathy or polyarthritis, multiple sites    Past Surgical History:  Procedure Laterality Date  . APPENDECTOMY    . COLONOSCOPY  02/25/05   Internal hemms; crohns; narrow anast  . COLONOSCOPY  02/26/10   Prior right hemicolectomy o/w benign (Dr. BOlevia Perches  . COLONOSCOPY  02/2015   benign biopsies, crohn's with stenotic stricture at anastomosis (Olevia Perches  . DEXA  07/06/2011   normal T score -1.0 femur, spine  . ESOPHAGOGASTRODUODENOSCOPY  02/25/05   Dilated stricture  . ESOPHAGOGASTRODUODENOSCOPY  10/2017   esophageal stenosis dilated, duodenal diverticuluc (Nandigam)  . HEMICOLECTOMY  1980   all small intestine, none large  . OTHER SURGICAL HISTORY  1960s, 1978, 1980   ileal resection x 3 and subtotal colon resection  . TOTAL ABDOMINAL HYSTERECTOMY  1975   ovaries out as well  . UPPER GASTROINTESTINAL ENDOSCOPY     Family History  Problem Relation Age of Onset  . Crohn's disease Father        colostomy  . Hypertension Mother   . Heart  disease Mother 79  . Alcohol abuse Brother   . Cirrhosis Brother   . Crohn's disease Sister   . Crohn's disease Paternal Aunt        x 3  . Crohn's disease Other        neice x 2  . Colon cancer Neg Hx   . Cancer Neg Hx   . Diabetes Neg Hx   . Rectal cancer Neg Hx   . Stomach cancer Neg Hx   . Esophageal cancer Neg Hx   . Breast cancer Neg Hx    Social History   Socioeconomic History  . Marital status: Divorced    Spouse name: Not on file  . Number of children: 3  . Years of education: Not on file  . Highest education level: Not on file  Occupational History  . Occupation: Medical  Transcription    Employer: [employer]  . Financial resource strain: Not on file  . Food insecurity:    Worry: Not on file    Inability: Not on file  . Transportation needs:    Medical: Not on file    Non-medical: Not on file  Tobacco Use  . Smoking status: Former Smoker    Packs/day: 0.25    Years: 15.00    Pack years: 3.75    Types: Cigarettes    Last attempt to quit: 10/26/1978    Years since quitting: 40.0  . Smokeless tobacco: Never Used  Substance and Sexual Activity  . Alcohol use: No  . Drug use: No  . Sexual activity: Not on file  Lifestyle  . Physical activity:    Days per week: Not on file    Minutes per session: Not on file  . Stress: Not on file  Relationships  . Social connections:    Talks on phone: Not on file    Gets together: Not on file    Attends religious service: Not on file    Active member of club or organization: Not on file    Attends meetings of clubs or organizations: Not on file    Relationship status: Not on file  Other Topics Concern  . 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 amLODipine (NORVASC) 5 MG tablet Take 1 tablet (5 mg total) by mouth daily.  . cetirizine (ZYRTEC) 10 MG tablet TAKE ONE TABLET DAILY AS DIRECTED  . cyanocobalamin (,VITAMIN B-12,) 1000 MCG/ML injection Inject 1 ML every 2 weeks  . mercaptopurine (PURINETHOL) 50 MG tablet TAKE 1 TABLET BY MOUTH DAILY 1 HOUR BEFORE OR 2 HOURS AFTER MEALS ON AN EMPTY STOMACH.  Marland Kitchen olopatadine (PATANOL) 0.1 % ophthalmic solution 1 drop 2 (two) times daily.  Marland Kitchen omeprazole (PRILOSEC) 40 MG capsule Take 1 capsule (40 mg total) by mouth daily.  . Syringe, Disposable, (BD TUBERCULIN SYRINGE) 1 ML  MISC 6 syringes for 1 month for B12 injections twice a month   Facility-Administered Encounter Medications as of 11/11/2018  Medication  . cyanocobalamin ((VITAMIN B-12)) injection 1,000 mcg    Activities of Daily Living In your present state of health, do you have any difficulty performing the following activities: 11/11/2018  Hearing? Darreld Mclean  Vision? N  Difficulty concentrating or making decisions? N  Walking or climbing stairs? N  Dressing or bathing? N  Doing errands, shopping? N  Preparing Food and eating ? N  Using the Toilet? N  In the past six months, have you accidently leaked urine? N  Do you have problems with loss of bowel control? N  Managing your Medications? N  Managing your Finances? N  Housekeeping or managing your Housekeeping? N  Some recent data might be hidden    Patient Care Team: [name], MD as PCP - General    Assessment:   This is a routine wellness examination for Shakita.   Hearing Screening   125Hz  250Hz  500Hz  1000Hz  2000Hz  3000Hz  4000Hz  6000Hz  8000Hz   Right ear:   40 0 40  40    Left ear:   0 0 40  0    Vision Screening Comments: Vision exam in Feb 2019 with Dr. Rick Duff   Exercise Activities and Dietary recommendations Current Exercise Habits: The patient does not participate in regular exercise at present, Exercise limited by: None identified  Goals    . Patient Stated     Starting 11/11/2018, I will continue to take medications as prescribed.        Fall Risk Fall Risk  11/11/2018  Falls in the past year? 0   Depression Screen PHQ 2/9 Scores 11/11/2018 11/09/2017  PHQ - 2 Score 0 0  PHQ- 9 Score 0 -     Cognitive Function MMSE - Mini Mental State Exam 11/11/2018  Orientation to time 5  Orientation to Place 5  Registration 3  Attention/ Calculation 0  Recall 3  Language- name 2 objects 0  Language- repeat 1  Language- follow 3 step command 3  Language- read & follow direction 0  Write a sentence 0  Copy design 0    Total score 20     PLEASE NOTE: A Mini-Cog screen was completed. Maximum score is 20. A value of 0 denotes this part of [place] MMSE was not completed or the patient failed this part of the Mini-Cog screening.   Mini-Cog Screening Orientation to Time - Max 5 pts Orientation to Place - Max 5 pts Registration - Max 3 pts Recall - Max 3 pts Language Repeat - Max 1 pts Language Follow 3 Step Command - Max 3 pts     Immunization History  Administered Date(s) Administered  . Hepatitis B 04/23/2013, 05/01/2013  . Hepatitis B, ped/adol 09/25/2013  . Influenza Whole 07/26/2008  . Influenza,inj,Quad PF,6+ Mos 08/13/2014, 09/01/2018  . Influenza-Unspecified 08/04/2016, 07/30/2017  . Pneumococcal Conjugate-13 11/09/2017  . Pneumococcal Polysaccharide-23 03/21/2013  . Td 08/07/2004  . Tdap 06/11/2014  . Zoster 08/24/2016    Screening Tests Health Maintenance  Topic Date Due  . PNA vac Low Risk Adult (2 of 2 - PPSV23) 11/15/2018 (Originally 11/09/2018)  . MAMMOGRAM  03/08/2019  . COLONOSCOPY  03/14/2020  . DTaP/Tdap/Td (2 - Td) 06/11/2024  . TETANUS/TDAP  06/11/2024  . INFLUENZA VACCINE  Completed  . DEXA SCAN  Completed  . Hepatitis C Screening  Completed      Plan:     I have personally reviewed, addressed, and noted the following in the patient's chart:  A. Medical and social history B. Use of alcohol, tobacco or illicit drugs  C. Current medications and supplements D. Functional ability and status E.  Nutritional status F.  Physical activity G. Advance directives H. List of other physicians I.  Hospitalizations, surgeries,  and ER visits in previous 12 months J.  Reliance to include hearing, vision, cognitive, depression L. Referrals and appointments - none  In addition, I have reviewed and discussed with patient certain preventive protocols, quality metrics, and best practice recommendations. A written personalized care plan for preventive services as well  as general preventive health recommendations were provided to patient.  See attached scanned questionnaire for additional information.   Signed,   Lindell Noe, MHA, BS, LPN Health Coach

## 2018-11-11 NOTE — Progress Notes (Signed)
PCP notes:   Health maintenance:  PPSV23 - PCP follow-up requested  Abnormal screenings:   Hearing - failed  Hearing Screening   125Hz  250Hz  500Hz  1000Hz  2000Hz  3000Hz  4000Hz  6000Hz  8000Hz   Right ear:   40 0 40  40    Left ear:   0 0 40  0     Patient concerns:   None  Nurse concerns:  None  Next PCP appt:   11/15/18 @ 1500

## 2018-11-11 NOTE — Patient Instructions (Signed)
Michelle Vasquez , Thank you for taking time to come for your Medicare Wellness Visit. I appreciate your ongoing commitment to your health goals. Please review the following plan we discussed and let me know if I can assist you in the future.   These are the goals we discussed: Goals    . Patient Stated     Starting 11/11/2018, I will continue to take medications as prescribed.        This is a list of the screening recommended for you and due dates:  Health Maintenance  Topic Date Due  . Pneumonia vaccines (2 of 2 - PPSV23) 11/15/2018*  . Mammogram  03/08/2019  . Colon Cancer Screening  03/14/2020  . DTaP/Tdap/Td vaccine (2 - Td) 06/11/2024  . Tetanus Vaccine  06/11/2024  . Flu Shot  Completed  . DEXA scan (bone density measurement)  Completed  .  Hepatitis C: One time screening is recommended by Center for Disease Control  (CDC) for  adults born from [birth year] through 1966.   Completed  *Topic was postponed. The date shown is not the original due date.   Preventive Care for Adults  A healthy lifestyle and preventive care can promote health and wellness. Preventive health guidelines for adults include the following key practices.  . A routine yearly physical is a good way to check with your health care provider about your health and preventive screening. It is a chance to share any concerns and updates on your health and to receive a thorough exam.  . Visit your dentist for a routine exam and preventive care every 6 months. Brush your teeth twice a day and floss once a day. Good oral hygiene prevents tooth decay and gum disease.  . The frequency of eye exams is based on your age, health, family medical history, use  of contact lenses, and other factors. Follow your health care provider's recommendations for frequency of eye exams.  . Eat a healthy diet. Foods like vegetables, fruits, whole grains, low-fat dairy products, and lean protein foods contain the nutrients you need without too many  calories. Decrease your intake of foods high in solid fats, added sugars, and salt. Eat the right amount of calories for you. Get information about a proper diet from your health care provider, if necessary.  . Regular physical exercise is one of the most important things you can do for your health. Most adults should get at least 150 minutes of moderate-intensity exercise (any activity that increases your heart rate and causes you to sweat) each week. In addition, most adults need muscle-strengthening exercises on 2 or more days a week.  Silver Sneakers may be a benefit available to you. To determine eligibility, you may visit the website: www.silversneakers.com or contact program at 906-700-1628 Mon-Fri between 8AM-8PM.   . Maintain a healthy weight. The body mass index (BMI) is a screening tool to identify possible weight problems. It provides an estimate of body fat based on height and weight. Your health care provider can find your BMI and can help you achieve or maintain a healthy weight.   For adults 20 years and older: ? A BMI below 18.5 is considered underweight. ? A BMI of 18.5 to 24.9 is normal. ? A BMI of 25 to 29.9 is considered overweight. ? A BMI of 30 and above is considered obese.   . Maintain normal blood lipids and cholesterol levels by exercising and minimizing your intake of saturated fat. Eat a balanced diet with plenty of  fruit and vegetables. Blood tests for lipids and cholesterol should begin at age 42 and be repeated every 5 years. If your lipid or cholesterol levels are high, you are over 50, or you are at high risk for heart disease, you may need your cholesterol levels checked more frequently. Ongoing high lipid and cholesterol levels should be treated with medicines if diet and exercise are not working.  . If you smoke, find out from your health care provider how to quit. If you do not use tobacco, please do not start.  . If you choose to drink alcohol, please do  not consume more than 2 drinks per day. One drink is considered to be 12 ounces (355 mL) of beer, 5 ounces (148 mL) of wine, or 1.5 ounces (44 mL) of liquor.  . If you are 54-10 years old, ask your health care provider if you should take aspirin to prevent strokes.  . Use sunscreen. Apply sunscreen liberally and repeatedly throughout the day. You should seek shade when your shadow is shorter than you. Protect yourself by wearing long sleeves, pants, a wide-brimmed hat, and sunglasses year round, whenever you are outdoors.  . Once a month, do a whole body skin exam, using a mirror to look at the skin on your back. Tell your health care provider of new moles, moles that have irregular borders, moles that are larger than a pencil eraser, or moles that have changed in shape or color.

## 2018-11-14 NOTE — Progress Notes (Signed)
BP 126/70 (BP Location: Left Arm, Patient Position: Sitting, Cuff Size: Normal)   Pulse 75   Temp 98.4 F (36.9 C) (Oral)   Ht 5' 1"  (1.549 m)   Wt 114 lb (51.7 kg)   SpO2 98%   BMI 21.54 kg/m    CC: AMW f/u visit  Subjective:    Patient ID: Michelle Vasquez, female    DOB: 04-Jun-1951, 68 y.o.   MRN: [id number]  HPI: Michelle Vasquez is a 68 y.o. female presenting on 11/15/2018 for Annual Exam (Pt 2. )   Saw Katha Cabal last week for medicare wellness visit. Note reviewed. Failed hearing screen L>R. Discussed - she has noticed trouble for several years. No significant noise exposure. Agrees to audiology referral.   Recent vertigo episode that lasted all day, 2 wks ago without nausea/vomiting.   Trouble sleeping - ongoing for last 2-3 months. Sleep initiation and sleep maintenance insomnia. Hasn't tried anything OTC for this.   Preventative: COLONOSCOPY Date: 02/2015 benign biopsies, crohn's with stenotic stricture at anastomosis Olevia Perches). Has established with Dr Silverio Decamp. rpt due 2021.  Well woman - s/p total hysterectomy for fibroids, ovaries removed at age 59yo. Took hormone replacementonly for a few months, stopped early but unclear reason.  DEXA spine -1.7, hip -1.6 02/2018 Mammogram Birads1 02/2018 - does breast exams at home.  Lung cancer screening - not eligible  Flu shot yearly at work. Tdap 2015 Pneumovax 2014.Prevnar 2019.  zostavax - 2017 shingrix - discussed - will defer given recent zostavax.  Completed Hep B series.  PPD at work yearly. Seat belt use discussed.  Sunscreen use discussed. No suspicious moles.  Ex smoker quit remotely - but around smokers  Alcohol - none  Dentist q6 mo (saw yesterday) Eye exam yearly - due  Caffeine: 2 cups coffee/day  Divorced 1989  3 children  Medical transcriptionist  Activity: walks dog 1 mi 4-5 times a week.  Diet: good water, fruits/vegetables ok      Relevant past medical, surgical, family and social history  reviewed and updated as indicated. Interim medical history since our last visit reviewed. Allergies and medications reviewed and updated. Outpatient Medications Prior to Visit  Medication Sig Dispense Refill  . amLODipine (NORVASC) 5 MG tablet Take 1 tablet (5 mg total) by mouth daily. 90 tablet 0  . cetirizine (ZYRTEC) 10 MG tablet TAKE ONE TABLET DAILY AS DIRECTED 100 tablet 3  . cyanocobalamin (,VITAMIN B-12,) 1000 MCG/ML injection Inject 1 ML every 2 weeks 60 mL 3  . mercaptopurine (PURINETHOL) 50 MG tablet TAKE 1 TABLET BY MOUTH DAILY 1 HOUR BEFORE OR 2 HOURS AFTER MEALS ON AN EMPTY STOMACH. 90 tablet 3  . olopatadine (PATANOL) 0.1 % ophthalmic solution 1 drop 2 (two) times daily.    Marland Kitchen omeprazole (PRILOSEC) 40 MG capsule Take 1 capsule (40 mg total) by mouth daily. 90 capsule 0  . Syringe, Disposable, (BD TUBERCULIN SYRINGE) 1 ML MISC 6 syringes for 1 month for B12 injections twice a month 6 each 6   Facility-Administered Medications Prior to Visit  Medication Dose Route Frequency Provider Last Rate Last Dose  . cyanocobalamin ((VITAMIN B-12)) injection 1,000 mcg  1,000 mcg Intramuscular Q30 days [name], MD   1,000 mcg at 01/19/17 1524     Per HPI unless specifically indicated in ROS section below Review of Systems  Constitutional: Negative for activity change, appetite change, chills, fatigue, fever and unexpected weight change.  HENT: Negative for hearing loss.   Eyes: Positive  for visual disturbance (blurry vision).  Respiratory: Negative for cough, chest tightness, shortness of breath and wheezing.   Cardiovascular: Negative for chest pain, palpitations and leg swelling.  Gastrointestinal: Negative for abdominal distention, abdominal pain, blood in stool, constipation, diarrhea, nausea and vomiting.  Genitourinary: Negative for difficulty urinating and hematuria.  Musculoskeletal: Negative for arthralgias, myalgias and neck pain.  Skin: Negative for rash.    Neurological: Positive for dizziness (vertigo) and headaches (sinus related). Negative for seizures and syncope.  Hematological: Negative for adenopathy. Does not bruise/bleed easily.  Psychiatric/Behavioral: Negative for dysphoric mood. The patient is not nervous/anxious.    Objective:    BP 126/70 (BP Location: Left Arm, Patient Position: Sitting, Cuff Size: Normal)   Pulse 75   Temp 98.4 F (36.9 C) (Oral)   Ht 5' 1"  (1.549 m)   Wt 114 lb (51.7 kg)   SpO2 98%   BMI 21.54 kg/m   Wt Readings from Last 3 Encounters:  11/15/18 114 lb (51.7 kg)  11/11/18 113 lb (51.3 kg)  06/22/18 113 lb (51.3 kg)    Physical Exam Vitals signs and nursing note reviewed.  Constitutional:      General: She is not in acute distress.    Appearance: She is well-developed.  HENT:     Head: Normocephalic and atraumatic.     Right Ear: Hearing, tympanic membrane, ear canal and external ear normal.     Left Ear: Hearing, tympanic membrane, ear canal and external ear normal.     Nose: Nose normal.     Mouth/Throat:     Mouth: Mucous membranes are moist.     Pharynx: Oropharynx is clear. Uvula midline. No oropharyngeal exudate or posterior oropharyngeal erythema.  Eyes:     General: No scleral icterus.    Conjunctiva/sclera: Conjunctivae normal.     Pupils: Pupils are equal, round, and reactive to light.  Neck:     Musculoskeletal: Normal range of motion and neck supple.     Thyroid: No thyromegaly.     Vascular: No carotid bruit.  Cardiovascular:     Rate and Rhythm: Normal rate and regular rhythm.     Pulses:          Radial pulses are 2+ on the right side and 2+ on the left side.     Heart sounds: Normal heart sounds. No murmur.  Pulmonary:     Effort: Pulmonary effort is normal. No respiratory distress.     Breath sounds: Normal breath sounds. No wheezing or rales.  Abdominal:     General: Bowel sounds are normal. There is no distension.     Palpations: Abdomen is soft. There is no mass.      Tenderness: There is no abdominal tenderness. There is no guarding or rebound.  Musculoskeletal: Normal range of motion.  Lymphadenopathy:     Cervical: No cervical adenopathy.  Skin:    General: Skin is warm and dry.     Findings: No rash.  Neurological:     Mental Status: She is alert and oriented to person, place, and time.     Comments: CN grossly intact, station and gait intact  Psychiatric:        Behavior: Behavior normal.        Thought Content: Thought content normal.        Judgment: Judgment normal.       Results for orders placed or performed in visit on 11/11/18  CBC with Differential/Platelet  Result Value Ref Range  WBC 6.3 4.0 - 10.5 K/uL   RBC 3.64 (L) 3.87 - 5.11 Mil/uL   Hemoglobin 12.1 12.0 - 15.0 g/dL   HCT 35.8 (L) 36.0 - 46.0 %   MCV 98.6 78.0 - 100.0 fl   MCHC 33.7 30.0 - 36.0 g/dL   RDW 16.2 (H) 11.5 - 15.5 %   Platelets 273.0 150.0 - 400.0 K/uL   Neutrophils Relative % 51.9 43.0 - 77.0 %   Lymphocytes Relative 39.4 12.0 - 46.0 %   Monocytes Relative 7.1 3.0 - 12.0 %   Eosinophils Relative 1.1 0.0 - 5.0 %   Basophils Relative 0.5 0.0 - 3.0 %   Neutro Abs 3.3 1.4 - 7.7 K/uL   Lymphs Abs 2.5 0.7 - 4.0 K/uL   Monocytes Absolute 0.5 0.1 - 1.0 K/uL   Eosinophils Absolute 0.1 0.0 - 0.7 K/uL   Basophils Absolute 0.0 0.0 - 0.1 K/uL  Comprehensive metabolic panel  Result Value Ref Range   Sodium 140 135 - 145 mEq/L   Potassium 4.6 3.5 - 5.1 mEq/L   Chloride 104 96 - 112 mEq/L   CO2 27 19 - 32 mEq/L   Glucose, Bld 86 70 - 99 mg/dL   BUN 11 6 - 23 mg/dL   Creatinine, Ser 0.79 0.40 - 1.20 mg/dL   Total Bilirubin 0.8 0.2 - 1.2 mg/dL   Alkaline Phosphatase 76 39 - 117 U/L   AST 28 0 - 37 U/L   ALT 26 0 - 35 U/L   Total Protein 7.9 6.0 - 8.3 g/dL   Albumin 4.4 3.5 - 5.2 g/dL   Calcium 9.8 8.4 - 10.5 mg/dL   GFR 72.33 >60.00 mL/min  Vitamin D, 25-hydroxy  Result Value Ref Range   VITD 17.96 (L) 30.00 - 100.00 ng/mL  Vitamin B12  Result Value  Ref Range   Vitamin B-12 247 211 - 911 pg/mL  Ferritin  Result Value Ref Range   Ferritin 13.3 10.0 - 291.0 ng/mL  IBC Panel([facility])  Result Value Ref Range   Iron 99 42 - 145 ug/dL   Transferrin 381.0 (H) 212.0 - 360.0 mg/dL   Saturation Ratios 18.6 (L) 20.0 - 50.0 %   Assessment & Plan:   Problem List Items Addressed This Visit    Vitamin B12 deficiency    rec restart monthly B12 shots at home.       S/P partial colectomy   Osteopenia    Reviewed last year's DEXA as well as recommended regular calcium/vit D intake and regular weight bearing exercise      MENOPAUSE, SURGICAL    At age 70yo, did not receive significant HRT      Iron deficiency anemia    rec QOD oral iron. If this is not sufficient, low threshold to order iron infusion.       Relevant Medications   ferrous sulfate 325 (65 FE) MG tablet   Insomnia    Sleep initiation and maintenance insomnia endorsed over last few months without known inciting event. Sleep hygiene handout provided. rec trial melatonin ,update with effect.       Healthcare maintenance - Primary    Preventative protocols reviewed and updated unless pt declined. Discussed healthy diet and lifestyle.       Crohn's disease ([place])    Appreciate GI care.       Asymmetrical hearing loss   Relevant Orders   Ambulatory referral to Audiology       Meds ordered this encounter  Medications  .  ferrous sulfate 325 (65 FE) MG tablet    Sig: Take 1 tablet (325 mg total) by mouth every other day.    Dispense:  90 tablet    Refill:  1  . Cholecalciferol (VITAMIN D) 50 MCG (2000 UT) CAPS    Sig: Take 1 capsule (2,000 Units total) by mouth daily.    Dispense:  90 capsule    Refill:  3   Orders Placed This Encounter  Procedures  . Ambulatory referral to Audiology    Referral Priority:   Routine    Referral Type:   Audiology Exam    Referral Reason:   Specialty Services Required    Number of Visits Requested:   1    Patient  instructions: We will refer you to audiologist.  If interested, check with pharmacy about new 2 shot shingles series (shingrix).  Restart oral iron every other day.  Start vitamin D 2000 units daily.  Get back on B12 shots monthly.  Return in 3-4 months for follow up sleep and to check iron levels if not done with GI.   Follow up plan: Return in about 1 year (around 11/16/2019) for annual exam, prior fasting for blood work, medicare wellness visit.  [name], MD

## 2018-11-15 ENCOUNTER — Encounter: Payer: Self-pay | Admitting: Family Medicine

## 2018-11-15 ENCOUNTER — Ambulatory Visit (INDEPENDENT_AMBULATORY_CARE_PROVIDER_SITE_OTHER): Payer: Medicare HMO | Admitting: Family Medicine

## 2018-11-15 VITALS — BP 126/70 | HR 75 | Temp 98.4°F | Ht 61.0 in | Wt 114.0 lb

## 2018-11-15 DIAGNOSIS — H903 Sensorineural hearing loss, bilateral: Secondary | ICD-10-CM | POA: Insufficient documentation

## 2018-11-15 DIAGNOSIS — Z0000 Encounter for general adult medical examination without abnormal findings: Secondary | ICD-10-CM

## 2018-11-15 DIAGNOSIS — E8941 Symptomatic postprocedural ovarian failure: Secondary | ICD-10-CM | POA: Diagnosis not present

## 2018-11-15 DIAGNOSIS — K5010 Crohn's disease of large intestine without complications: Secondary | ICD-10-CM | POA: Diagnosis not present

## 2018-11-15 DIAGNOSIS — M8580 Other specified disorders of bone density and structure, unspecified site: Secondary | ICD-10-CM

## 2018-11-15 DIAGNOSIS — D508 Other iron deficiency anemias: Secondary | ICD-10-CM

## 2018-11-15 DIAGNOSIS — G4700 Insomnia, unspecified: Secondary | ICD-10-CM | POA: Diagnosis not present

## 2018-11-15 DIAGNOSIS — E538 Deficiency of other specified B group vitamins: Secondary | ICD-10-CM | POA: Diagnosis not present

## 2018-11-15 DIAGNOSIS — Z9049 Acquired absence of other specified parts of digestive tract: Secondary | ICD-10-CM

## 2018-11-15 DIAGNOSIS — H918X9 Other specified hearing loss, unspecified ear: Secondary | ICD-10-CM | POA: Diagnosis not present

## 2018-11-15 MED ORDER — FERROUS SULFATE 325 (65 FE) MG PO TABS: 325.0000 mg | ORAL_TABLET | ORAL | 1 refills | Status: DC

## 2018-11-15 MED ORDER — VITAMIN D 50 MCG (2000 UT) PO CAPS: 1.0000 | ORAL_CAPSULE | Freq: Every day | ORAL | 3 refills | Status: DC

## 2018-11-15 NOTE — Assessment & Plan Note (Signed)
At age 68yo, did not receive significant HRT

## 2018-11-15 NOTE — Assessment & Plan Note (Signed)
Appreciate GI care.

## 2018-11-15 NOTE — Assessment & Plan Note (Signed)
Sleep initiation and maintenance insomnia endorsed over last few months without known inciting event. Sleep hygiene handout provided. rec trial melatonin ,update with effect.

## 2018-11-15 NOTE — Assessment & Plan Note (Signed)
Reviewed last year's DEXA as well as recommended regular calcium/vit D intake and regular weight bearing exercise

## 2018-11-15 NOTE — Assessment & Plan Note (Addendum)
rec restart monthly B12 shots at home.

## 2018-11-15 NOTE — Assessment & Plan Note (Signed)
rec QOD oral iron. If this is not sufficient, low threshold to order iron infusion.

## 2018-11-15 NOTE — Assessment & Plan Note (Signed)
Preventative protocols reviewed and updated unless pt declined. Discussed healthy diet and lifestyle.  

## 2018-11-15 NOTE — Patient Instructions (Addendum)
We will refer you to audiologist.  If interested, check with pharmacy about new 2 shot shingles series (shingrix).  Restart oral iron every other day.  Start vitamin D 2000 units daily.  Get back on B12 shots monthly.  Return in 3-4 months for follow up sleep and to check iron levels if not done with GI.   For sleep, trial melatonin 35m nightly  Sleep hygiene checklist: 1. Avoid naps during the day 2. Avoid stimulants such as caffeine and nicotine. Avoid bedtime alcohol (it can speed onset of sleep but the body's metabolism can cause awakenings). 3. All forms of exercise help ensure sound sleep - limit vigorous exercise to morning or late afternoon 4. Avoid food too close to bedtime including chocolate (which contains caffeine) 5. Soak up natural light 6. Establish regular bedtime routine. 7. Associate bed with sleep - avoid TV, computer or phone, reading while in bed. 8. Ensure pleasant, relaxing sleep environment - quiet, dark, cool room.   Health Maintenance After Age 4547After age 69 you are at a higher risk for certain long-term diseases and infections as well as injuries from falls. Falls are a major cause of broken bones and head injuries in people who are older than age 69 Getting regular preventive care can help to keep you healthy and well. Preventive care includes getting regular testing and making lifestyle changes as recommended by your health care provider. Talk with your health care provider about:  Which screenings and tests you should have. A screening is a test that checks for a disease when you have no symptoms.  A diet and exercise plan that is right for you. What should I know about screenings and tests to prevent falls? Screening and testing are the best ways to find a health problem early. Early diagnosis and treatment give you the best chance of managing medical conditions that are common after age 69 Certain conditions and lifestyle choices may make you more likely  to have a fall. Your health care provider may recommend:  Regular vision checks. Poor vision and conditions such as cataracts can make you more likely to have a fall. If you wear glasses, make sure to get your prescription updated if your vision changes.  Medicine review. Work with your health care provider to regularly review all of the medicines you are taking, including over-the-counter medicines. Ask your health care provider about any side effects that may make you more likely to have a fall. Tell your health care provider if any medicines that you take make you feel dizzy or sleepy.  Osteoporosis screening. Osteoporosis is a condition that causes the bones to get weaker. This can make the bones weak and cause them to break more easily.  Blood pressure screening. Blood pressure changes and medicines to control blood pressure can make you feel dizzy.  Strength and balance checks. Your health care provider may recommend certain tests to check your strength and balance while standing, walking, or changing positions.  Foot health exam. Foot pain and numbness, as well as not wearing proper footwear, can make you more likely to have a fall.  Depression screening. You may be more likely to have a fall if you have a fear of falling, feel emotionally low, or feel unable to do activities that you used to do.  Alcohol use screening. Using too much alcohol can affect your balance and may make you more likely to have a fall. What actions can I take to lower my risk of  falls? General instructions  Talk with your health care provider about your risks for falling. Tell your health care provider if: ? You fall. Be sure to tell your health care provider about all falls, even ones that seem minor. ? You feel dizzy, sleepy, or off-balance.  Take over-the-counter and prescription medicines only as told by your health care provider. These include any supplements.  Eat a healthy diet and maintain a healthy  weight. A healthy diet includes low-fat dairy products, low-fat (lean) meats, and fiber from whole grains, beans, and lots of fruits and vegetables. Home safety  Remove any tripping hazards, such as rugs, cords, and clutter.  Install safety equipment such as grab bars in bathrooms and safety rails on stairs.  Keep rooms and walkways well-lit. Activity   Follow a regular exercise program to stay fit. This will help you maintain your balance. Ask your health care provider what types of exercise are appropriate for you.  If you need a cane or walker, use it as recommended by your health care provider.  Wear supportive shoes that have nonskid soles. Lifestyle  Do not drink alcohol if your health care provider tells you not to drink.  If you drink alcohol, limit how much you have: ? 0-1 drink a day for women. ? 0-2 drinks a day for men.  Be aware of how much alcohol is in your drink. In the [place] one drink equals one typical bottle of beer (12 oz), one-half glass of wine (5 oz), or one shot of hard liquor (1 oz).  Do not use any products that contain nicotine or tobacco, such as cigarettes and e-cigarettes. If you need help quitting, ask your health care provider. Summary  Having a healthy lifestyle and getting preventive care can help to protect your health and wellness after age 69.  Screening and testing are the best way to find a health problem early and help you avoid having a fall. Early diagnosis and treatment give you the best chance for managing medical conditions that are more common for people who are older than age 61.  Falls are a major cause of broken bones and head injuries in people who are older than age 24. Take precautions to prevent a fall at home.  Work with your health care provider to learn what changes you can make to improve your health and wellness and to prevent falls. This information is not intended to replace advice given to you by your health care  provider. Make sure you discuss any questions you have with your health care provider. Document Released: 08/25/2017 Document Revised: 08/25/2017 Document Reviewed: 08/25/2017 Elsevier Interactive Patient Education  2019 [employer].

## 2018-11-21 ENCOUNTER — Other Ambulatory Visit: Payer: Self-pay | Admitting: Family Medicine

## 2018-11-24 DIAGNOSIS — H903 Sensorineural hearing loss, bilateral: Secondary | ICD-10-CM | POA: Diagnosis not present

## 2018-11-26 ENCOUNTER — Other Ambulatory Visit: Payer: Self-pay | Admitting: Gastroenterology

## 2018-11-29 NOTE — Progress Notes (Signed)
I reviewed health advisor's note, was available for consultation, and agree with documentation and plan.  

## 2018-12-07 ENCOUNTER — Encounter: Payer: Self-pay | Admitting: Family Medicine

## 2018-12-09 ENCOUNTER — Other Ambulatory Visit: Payer: Self-pay | Admitting: Gastroenterology

## 2018-12-28 ENCOUNTER — Telehealth: Payer: Self-pay

## 2018-12-28 ENCOUNTER — Telehealth: Payer: Self-pay | Admitting: Gastroenterology

## 2018-12-28 NOTE — Telephone Encounter (Signed)
Offered pt an appt on Monday and she states she cannot take that appt as she will be out of town. Pt states she is hurting and she needs to be seen sooner and she will either go to the ER, Urgent care or her PCP.

## 2018-12-28 NOTE — Telephone Encounter (Signed)
Pt said having indigestion; pt cannot eat or drink because it causes her chest and back to hurt and pt feels full (pain level now is 8). Pt is constantly burping, stomach churning. No N&V. Last night at 10 PM pt belched up bread she ate at 5 PM. Pt had symptoms before and had endoscopy which was normal. No SOB. Pt has been taking omeprazole 40 mg every day. Dr Danise Mina recommended pt be seen at [facility]. Pt said she will ck cost of copay for UC with ins. Pt will either go to UC or ED. FYI to Dr Danise Mina.

## 2018-12-28 NOTE — Telephone Encounter (Signed)
Thanks. plz call tomorrow for an update.

## 2018-12-28 NOTE — Telephone Encounter (Signed)
Pt is requesting an appt asap, she stated that she has not been able to eat, reports feeling like having indigestion but is not sure if that's the case, she has been feeling like that for two weeks. Pls call her.

## 2018-12-29 ENCOUNTER — Other Ambulatory Visit: Payer: Self-pay | Admitting: Gastroenterology

## 2018-12-29 DIAGNOSIS — R140 Abdominal distension (gaseous): Secondary | ICD-10-CM | POA: Diagnosis not present

## 2018-12-29 DIAGNOSIS — K50018 Crohn's disease of small intestine with other complication: Secondary | ICD-10-CM

## 2018-12-29 DIAGNOSIS — R1013 Epigastric pain: Secondary | ICD-10-CM

## 2018-12-29 DIAGNOSIS — K50019 Crohn's disease of small intestine with unspecified complications: Secondary | ICD-10-CM

## 2018-12-29 NOTE — Telephone Encounter (Signed)
Pt returned your call [phone]

## 2018-12-29 NOTE — Telephone Encounter (Signed)
Left message on vm for pt to call back.   Dr. [name] is requesting update on pt.

## 2018-12-29 NOTE — Telephone Encounter (Signed)
Called pt back. Left message on vm for pt to call back.   Dr. [name] wants to know how pt is doing.

## 2018-12-29 NOTE — Telephone Encounter (Signed)
Pt returning call to nurse. Please call pt

## 2018-12-29 NOTE — Telephone Encounter (Signed)
Spoke with pt asking how she is doing. States she is doing better.  She did not go to ER but was able to see a provider with [facility]. Says she was given some meds to relieve the pressure (not sure of the name). Has a CT scheduled in the morning. Pt expresses her thanks for the checking on her.

## 2018-12-30 ENCOUNTER — Ambulatory Visit
Admission: RE | Admit: 2018-12-30 | Discharge: 2018-12-30 | Disposition: A | Discharge status: Home / Self Care | Payer: Medicare HMO | Source: Ambulatory Visit | Attending: Gastroenterology | Admitting: Gastroenterology

## 2018-12-30 DIAGNOSIS — K50019 Crohn's disease of small intestine with unspecified complications: Secondary | ICD-10-CM

## 2018-12-30 DIAGNOSIS — K50018 Crohn's disease of small intestine with other complication: Secondary | ICD-10-CM

## 2018-12-30 DIAGNOSIS — K5090 Crohn's disease, unspecified, without complications: Secondary | ICD-10-CM | POA: Diagnosis not present

## 2018-12-30 DIAGNOSIS — R1013 Epigastric pain: Secondary | ICD-10-CM

## 2018-12-30 MED ORDER — IOPAMIDOL (ISOVUE-300) INJECTION 61%
100.0000 mL | Freq: Once | INTRAVENOUS | Status: AC | PRN
  Administered 2018-12-30: 100 mL via INTRAVENOUS

## 2019-01-02 ENCOUNTER — Ambulatory Visit: Payer: Medicare HMO | Admitting: Nurse Practitioner

## 2019-03-02 DIAGNOSIS — R140 Abdominal distension (gaseous): Secondary | ICD-10-CM | POA: Diagnosis not present

## 2019-03-02 DIAGNOSIS — R1013 Epigastric pain: Secondary | ICD-10-CM | POA: Diagnosis not present

## 2019-03-02 DIAGNOSIS — K5000 Crohn's disease of small intestine without complications: Secondary | ICD-10-CM | POA: Diagnosis not present

## 2019-03-02 DIAGNOSIS — Z9049 Acquired absence of other specified parts of digestive tract: Secondary | ICD-10-CM | POA: Diagnosis not present

## 2019-03-06 DIAGNOSIS — K5000 Crohn's disease of small intestine without complications: Secondary | ICD-10-CM | POA: Diagnosis not present

## 2019-03-09 ENCOUNTER — Encounter: Payer: Self-pay | Admitting: Family Medicine

## 2019-03-09 ENCOUNTER — Other Ambulatory Visit: Payer: Medicare HMO

## 2019-03-09 ENCOUNTER — Ambulatory Visit (INDEPENDENT_AMBULATORY_CARE_PROVIDER_SITE_OTHER): Payer: Medicare HMO | Admitting: Family Medicine

## 2019-03-09 VITALS — Ht 61.0 in

## 2019-03-09 DIAGNOSIS — N3000 Acute cystitis without hematuria: Secondary | ICD-10-CM

## 2019-03-09 DIAGNOSIS — R300 Dysuria: Secondary | ICD-10-CM

## 2019-03-09 DIAGNOSIS — N390 Urinary tract infection, site not specified: Secondary | ICD-10-CM | POA: Insufficient documentation

## 2019-03-09 LAB — POC URINALSYSI DIPSTICK (AUTOMATED)
Glucose, UA: NEGATIVE
Ketones, UA: NEGATIVE
Nitrite, UA: NEGATIVE
Protein, UA: NEGATIVE
Spec Grav, UA: 1.02 (ref 1.010–1.025)
Urobilinogen, UA: 0.2 E.U./dL
pH, UA: 5.5 (ref 5.0–8.0)

## 2019-03-09 MED ORDER — CEPHALEXIN 500 MG PO CAPS: 500.0000 mg | ORAL_CAPSULE | Freq: Two times a day (BID) | ORAL | 0 refills | Status: DC

## 2019-03-09 NOTE — Addendum Note (Signed)
Addended by: [name] on: 03/09/2019 10:21 AM   Modules accepted: Orders

## 2019-03-09 NOTE — Progress Notes (Addendum)
Virtual visit completed through [employer]. Due to national recommendations of social distancing due to Kerrick 19, a virtual visit is felt to be most appropriate for this patient at this time.   Patient location: at work  Provider location: Financial [place] at [facility], office If any vitals were documented, they were collected by patient at home unless specified below.    Ht 5' 1"  (1.549 m)   BMI 21.54 kg/m    CC: ?UTI Subjective:    Patient ID: Michelle Vasquez, female    DOB: 05/19/1951, 68 y.o.   MRN: [id number]  HPI: Michelle Vasquez is a 68 y.o. female presenting on 03/09/2019 for Dysuria (C/o pain and burning when she urinates. Started about 3 wks ago. ) and Urinary Frequency   3 wk h/o UTI sxs - frequency, urgency, dysuria. Initially treated with azo and cranberry juice with some benefit. Initial low grade fever, chills now resolved.  No abd pain, flank pain, nausea/vomiting, hematuria.  No recent abx.  No recent UTI.  She is on mercaptopurine for h/o crohn's.  She has started seeing [facility] - Dr Bonnye Fava.      Relevant past medical, surgical, family and social history reviewed and updated as indicated. Interim medical history since our last visit reviewed. Allergies and medications reviewed and updated. Outpatient Medications Prior to Visit  Medication Sig Dispense Refill  . amLODipine (NORVASC) 5 MG tablet TAKE 1 TABLET (5 MG TOTAL) BY MOUTH DAILY. 90 tablet 3  . cetirizine (ZYRTEC) 10 MG tablet TAKE ONE TABLET DAILY AS DIRECTED 100 tablet 3  . Cholecalciferol (VITAMIN D) 50 MCG (2000 UT) CAPS Take 1 capsule (2,000 Units total) by mouth daily. 90 capsule 3  . cyanocobalamin (,VITAMIN B-12,) 1000 MCG/ML injection INJECT 1 ML EVERY 2 WEEKS AS DIRECTED 234 mL 2  . ferrous sulfate 325 (65 FE) MG tablet Take 1 tablet (325 mg total) by mouth every other day. 90 tablet 1  . LINZESS 72 MCG capsule TAKE 1 CAPSULE BY MOUTH ONCE DAILY BEFORE BREAKFAST 90 capsule 2  . mercaptopurine  (PURINETHOL) 50 MG tablet TAKE 1 TABLET BY MOUTH DAILY 1 HOUR BEFORE OR 2 HOURS AFTER MEALS ON AN EMPTY STOMACH. 90 tablet 3  . olopatadine (PATANOL) 0.1 % ophthalmic solution 1 drop 2 (two) times daily.    . pantoprazole (PROTONIX) 40 MG tablet Take 1 tablet by mouth daily.    . Syringe, Disposable, (BD TUBERCULIN SYRINGE) 1 ML MISC 6 syringes for 1 month for B12 injections twice a month 6 each 6  . omeprazole (PRILOSEC) 40 MG capsule Take 1 capsule (40 mg total) by mouth daily. 90 capsule 0   Facility-Administered Medications Prior to Visit  Medication Dose Route Frequency Provider Last Rate Last Dose  . cyanocobalamin ((VITAMIN B-12)) injection 1,000 mcg  1,000 mcg Intramuscular Q30 days [name], MD   1,000 mcg at 01/19/17 1524     Per HPI unless specifically indicated in ROS section below Review of Systems Objective:    Ht 5' 1"  (1.549 m)   BMI 21.54 kg/m   Wt Readings from Last 3 Encounters:  11/15/18 114 lb (51.7 kg)  11/11/18 113 lb (51.3 kg)  06/22/18 113 lb (51.3 kg)     Physical exam: Gen: alert, NAD, not ill appearing Pulm: speaks in complete sentences without increased work of breathing Psych: normal mood, normal thought content      Results for orders placed or performed in visit on 03/09/19  POCT Urinalysis Dipstick (Automated)  Result Value Ref Range   Color, UA gold    Clarity, UA cloudy    Glucose, UA Negative Negative   Bilirubin, UA 1+    Ketones, UA negative    Spec Grav, UA 1.020 1.010 - 1.025   Blood, UA 1+    pH, UA 5.5 5.0 - 8.0   Protein, UA Negative Negative   Urobilinogen, UA 0.2 0.2 or 1.0 E.U./dL   Nitrite, UA negative    Leukocytes, UA Large (3+) (A) Negative   Assessment & Plan:   Problem List Items Addressed This Visit    UTI (urinary tract infection) - Primary    Symptoms and UA/micro most consistent with acute cystitis. Treat with keflex 7d course, UCx sent. Update if not improving with treatment. Pt agrees with plan.        Relevant Medications   cephALEXin (KEFLEX) 500 MG capsule   Other Relevant Orders   Urine Culture    Other Visit Diagnoses    Dysuria       Relevant Orders   POCT Urinalysis Dipstick (Automated) (Completed)       Meds ordered this encounter  Medications  . cephALEXin (KEFLEX) 500 MG capsule    Sig: Take 1 capsule (500 mg total) by mouth 2 (two) times daily.    Dispense:  14 capsule    Refill:  0   Orders Placed This Encounter  Procedures  . Urine Culture  . POCT Urinalysis Dipstick (Automated)    Follow up plan: No follow-ups on file.  [name], MD

## 2019-03-09 NOTE — Assessment & Plan Note (Signed)
Symptoms and UA/micro most consistent with acute cystitis. Treat with keflex 7d course, UCx sent. Update if not improving with treatment. Pt agrees with plan.

## 2019-03-11 LAB — URINE CULTURE
MICRO NUMBER:: 474845
SPECIMEN QUALITY:: ADEQUATE

## 2019-03-24 DIAGNOSIS — D649 Anemia, unspecified: Secondary | ICD-10-CM | POA: Diagnosis not present

## 2019-03-30 ENCOUNTER — Encounter: Payer: Self-pay | Admitting: Family Medicine

## 2019-03-30 ENCOUNTER — Other Ambulatory Visit: Payer: Medicare HMO

## 2019-03-30 ENCOUNTER — Ambulatory Visit (INDEPENDENT_AMBULATORY_CARE_PROVIDER_SITE_OTHER): Payer: Medicare HMO | Admitting: Family Medicine

## 2019-03-30 VITALS — Ht 61.0 in

## 2019-03-30 DIAGNOSIS — K5010 Crohn's disease of large intestine without complications: Secondary | ICD-10-CM | POA: Diagnosis not present

## 2019-03-30 DIAGNOSIS — N3000 Acute cystitis without hematuria: Secondary | ICD-10-CM | POA: Diagnosis not present

## 2019-03-30 DIAGNOSIS — R300 Dysuria: Secondary | ICD-10-CM | POA: Diagnosis not present

## 2019-03-30 LAB — POC URINALSYSI DIPSTICK (AUTOMATED)
Glucose, UA: NEGATIVE
Ketones, UA: NEGATIVE
Nitrite, UA: NEGATIVE
Protein, UA: NEGATIVE
Spec Grav, UA: 1.02 (ref 1.010–1.025)
Urobilinogen, UA: 0.2 E.U./dL
pH, UA: 5.5 (ref 5.0–8.0)

## 2019-03-30 MED ORDER — CEPHALEXIN 500 MG PO CAPS: 500.0000 mg | ORAL_CAPSULE | Freq: Two times a day (BID) | ORAL | 0 refills | Status: DC

## 2019-03-30 NOTE — Progress Notes (Signed)
Virtual visit completed through [employer]. Due to national recommendations of social distancing due to COVID-19, a virtual visit is felt to be most appropriate for this patient at this time. Reviewed limitations of a virtual visit.   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, office If any vitals were documented, they were collected by patient at home unless specified below.    Ht 5' 1"  (1.549 m)   BMI 21.54 kg/m    CC: recurrent UTI.  Subjective:    Patient ID: Michelle Vasquez, female    DOB: 1951-03-31, 68 y.o.   MRN: [id number]  HPI: Michelle Vasquez is a 68 y.o. female presenting on 03/30/2019 for Dysuria (C/o burning when urinating, urinary frequency and urgency.  Feels like she is not emptying completely.  Recently had same sxs which improved with abx but returned on 03/26/19. )    Seen here 03/09/2019 with UTI treated with 7d keflex course. UCx at that time grew >100k E coli resistant only lto ampicillin.   Symptoms did improve, but then 3d ago recurred with dysuria, urgency, frequency, incomplete emptying.   No hematuria, fevers/chills, nausea/vomiting, flank pain.   She has h/o crohn's and has started seeing [facility] - Dr Bonnye Fava.  6-MP has been stopped temporarily due to anemia.      Relevant past medical, surgical, family and social history reviewed and updated as indicated. Interim medical history since our last visit reviewed. Allergies and medications reviewed and updated. Outpatient Medications Prior to Visit  Medication Sig Dispense Refill  . amLODipine (NORVASC) 5 MG tablet TAKE 1 TABLET (5 MG TOTAL) BY MOUTH DAILY. 90 tablet 3  . cetirizine (ZYRTEC) 10 MG tablet TAKE ONE TABLET DAILY AS DIRECTED 100 tablet 3  . Cholecalciferol (VITAMIN D) 50 MCG (2000 UT) CAPS Take 1 capsule (2,000 Units total) by mouth daily. 90 capsule 3  . cyanocobalamin (,VITAMIN B-12,) 1000 MCG/ML injection INJECT 1 ML EVERY 2 WEEKS AS DIRECTED 234 mL 2  . ferrous  sulfate 325 (65 FE) MG tablet Take 1 tablet (325 mg total) by mouth every other day. 90 tablet 1  . LINZESS 72 MCG capsule TAKE 1 CAPSULE BY MOUTH ONCE DAILY BEFORE BREAKFAST 90 capsule 2  . olopatadine (PATANOL) 0.1 % ophthalmic solution 1 drop 2 (two) times daily.    . pantoprazole (PROTONIX) 40 MG tablet Take 1 tablet by mouth daily.    . Syringe, Disposable, (BD TUBERCULIN SYRINGE) 1 ML MISC 6 syringes for 1 month for B12 injections twice a month 6 each 6  . mercaptopurine (PURINETHOL) 50 MG tablet TAKE 1 TABLET BY MOUTH DAILY 1 HOUR BEFORE OR 2 HOURS AFTER MEALS ON AN EMPTY STOMACH. (Patient not taking: Reported on 03/30/2019) 90 tablet 3  . cephALEXin (KEFLEX) 500 MG capsule Take 1 capsule (500 mg total) by mouth 2 (two) times daily. 14 capsule 0   Facility-Administered Medications Prior to Visit  Medication Dose Route Frequency Provider Last Rate Last Dose  . cyanocobalamin ((VITAMIN B-12)) injection 1,000 mcg  1,000 mcg Intramuscular Q30 days [name], MD   1,000 mcg at 01/19/17 1524     Per HPI unless specifically indicated in ROS section below Review of Systems Objective:    Ht 5' 1"  (1.549 m)   BMI 21.54 kg/m   Wt Readings from Last 3 Encounters:  11/15/18 114 lb (51.7 kg)  11/11/18 113 lb (51.3 kg)  06/22/18 113 lb (51.3 kg)     Physical exam: Gen: alert,  NAD, not ill appearing Pulm: speaks in complete sentences without increased work of breathing Psych: normal mood, normal thought content      Results for orders placed or performed in visit on 03/30/19  POCT Urinalysis Dipstick (Automated)  Result Value Ref Range   Color, UA yellow    Clarity, UA clear    Glucose, UA Negative Negative   Bilirubin, UA 1+    Ketones, UA negative    Spec Grav, UA 1.020 1.010 - 1.025   Blood, UA 1+    pH, UA 5.5 5.0 - 8.0   Protein, UA Negative Negative   Urobilinogen, UA 0.2 0.2 or 1.0 E.U./dL   Nitrite, UA negative    Leukocytes, UA Small (1+) (A) Negative    Assessment & Plan:   Problem List Items Addressed This Visit    UTI (urinary tract infection) - Primary    Story consistent with recurrent acute cystitis that did respond to initial keflex but given longevity of UTI symptoms prior to starting abx she may need extended abx course. Will refill keflex 1 wk course, will send UCx. Pt agrees with plan.       Relevant Medications   cephALEXin (KEFLEX) 500 MG capsule   Crohn's disease ([place])    Other Visit Diagnoses    Dysuria       Relevant Orders   POCT Urinalysis Dipstick (Automated) (Completed)   Urine Culture       Meds ordered this encounter  Medications  . cephALEXin (KEFLEX) 500 MG capsule    Sig: Take 1 capsule (500 mg total) by mouth 2 (two) times daily.    Dispense:  14 capsule    Refill:  0   Orders Placed This Encounter  Procedures  . Urine Culture  . POCT Urinalysis Dipstick (Automated)    I discussed the assessment and treatment plan with the patient. The patient was provided an opportunity to ask questions and all were answered. The patient agreed with the plan and demonstrated an understanding of the instructions. The patient was advised to call back or seek an in-person evaluation if the symptoms worsen or if the condition fails to improve as anticipated.  Follow up plan: Return if symptoms worsen or fail to improve.  [name], MD

## 2019-03-30 NOTE — Assessment & Plan Note (Signed)
Story consistent with recurrent acute cystitis that did respond to initial keflex but given longevity of UTI symptoms prior to starting abx she may need extended abx course. Will refill keflex 1 wk course, will send UCx. Pt agrees with plan.

## 2019-03-31 LAB — URINE CULTURE
MICRO NUMBER:: 537604
SPECIMEN QUALITY:: ADEQUATE

## 2019-04-27 DIAGNOSIS — D649 Anemia, unspecified: Secondary | ICD-10-CM | POA: Diagnosis not present

## 2019-08-31 ENCOUNTER — Other Ambulatory Visit: Payer: Self-pay | Admitting: Family Medicine

## 2019-09-07 ENCOUNTER — Ambulatory Visit (INDEPENDENT_AMBULATORY_CARE_PROVIDER_SITE_OTHER): Payer: Medicare HMO

## 2019-09-07 ENCOUNTER — Other Ambulatory Visit: Payer: Self-pay

## 2019-09-07 DIAGNOSIS — Z23 Encounter for immunization: Secondary | ICD-10-CM

## 2019-09-27 DIAGNOSIS — K219 Gastro-esophageal reflux disease without esophagitis: Secondary | ICD-10-CM | POA: Diagnosis not present

## 2019-09-27 DIAGNOSIS — Z9049 Acquired absence of other specified parts of digestive tract: Secondary | ICD-10-CM | POA: Diagnosis not present

## 2019-09-27 DIAGNOSIS — K5000 Crohn's disease of small intestine without complications: Secondary | ICD-10-CM | POA: Diagnosis not present

## 2019-10-02 ENCOUNTER — Ambulatory Visit (INDEPENDENT_AMBULATORY_CARE_PROVIDER_SITE_OTHER): Payer: Medicare HMO | Admitting: Family Medicine

## 2019-10-02 ENCOUNTER — Other Ambulatory Visit: Payer: Self-pay

## 2019-10-02 ENCOUNTER — Encounter: Payer: Self-pay | Admitting: Family Medicine

## 2019-10-02 VITALS — Temp 98.0°F | Ht 61.0 in | Wt 113.0 lb

## 2019-10-02 DIAGNOSIS — J029 Acute pharyngitis, unspecified: Secondary | ICD-10-CM | POA: Diagnosis not present

## 2019-10-02 DIAGNOSIS — J019 Acute sinusitis, unspecified: Secondary | ICD-10-CM | POA: Diagnosis not present

## 2019-10-02 DIAGNOSIS — Z20822 Contact with and (suspected) exposure to covid-19: Secondary | ICD-10-CM

## 2019-10-02 MED ORDER — AMOXICILLIN-POT CLAVULANATE 875-125 MG PO TABS: 1.0000 | ORAL_TABLET | Freq: Two times a day (BID) | ORAL | 0 refills | Status: AC

## 2019-10-02 NOTE — Assessment & Plan Note (Signed)
Anticipate bacterial sinusitis given duration and progression of symptoms. Treat with augmentin antibiotic course. Pt declines anti-nausea med. Given nausea, fatigue, ?dyspnea, acute deterioration in setting of covid pandemic, reasonable to send for testing - reviewed testing instructions. Pt agrees with plan.

## 2019-10-02 NOTE — Progress Notes (Signed)
Virtual visit completed through [employer]. Due to national recommendations of social distancing due to COVID-19, a virtual visit is felt to be most appropriate for this patient at this time. Reviewed limitations of a virtual visit.   Patient location: home Provider location: [place] at [facility], office If any vitals were documented, they were collected by patient at home unless specified below.    Temp 98 F (36.7 C) (Oral)    Ht 5' 1"  (1.549 m)    Wt 113 lb (51.3 kg)    BMI 21.35 kg/m    CC: ST Subjective:    Patient ID: Michelle Vasquez, female    DOB: 06/01/1951, 68 y.o.   MRN: [id number]  HPI: Michelle Vasquez is a 68 y.o. female presenting on 10/02/2019 for Sinusitis (C/o chills, fatigue, nausea, nasal congestion, drainage and HA.  Sxs started about 2 wks.  Tried Tylenol and nasal saline rinse.  Both are helpful. )   2 wk h/o nasal congestion, dry stuffy sinuses, PNdrainage, HA. Yesterday woke up with sore throat, nausea, chills, fatigue, some vertigo. "Feel horrible" yesterday had some dyspnea as well. Tylenol and nasal saline rinse are helpful. Some diarrhea. Some loss of smell. Significant facial pain/pressure L>R.   No cough, abd pain, loss of taste.  No sick contacts at home.  Daughter smokes outside.       Relevant past medical, surgical, family and social history reviewed and updated as indicated. Interim medical history since our last visit reviewed. Allergies and medications reviewed and updated. Outpatient Medications Prior to Visit  Medication Sig Dispense Refill   amLODipine (NORVASC) 5 MG tablet TAKE 1 TABLET (5 MG TOTAL) BY MOUTH DAILY. 90 tablet 0   cetirizine (ZYRTEC) 10 MG tablet TAKE ONE TABLET DAILY AS DIRECTED 100 tablet 3   Cholecalciferol (VITAMIN D) 50 MCG (2000 UT) CAPS Take 1 capsule (2,000 Units total) by mouth daily. 90 capsule 3   cyanocobalamin (,VITAMIN B-12,) 1000 MCG/ML injection INJECT 1 ML EVERY 2 WEEKS AS DIRECTED 234 mL 2   ferrous  sulfate 325 (65 FE) MG tablet Take 1 tablet (325 mg total) by mouth every other day. 90 tablet 1   LINZESS 72 MCG capsule TAKE 1 CAPSULE BY MOUTH ONCE DAILY BEFORE BREAKFAST 90 capsule 2   mercaptopurine (PURINETHOL) 50 MG tablet TAKE 1 TABLET BY MOUTH DAILY 1 HOUR BEFORE OR 2 HOURS AFTER MEALS ON AN EMPTY STOMACH. 90 tablet 3   pantoprazole (PROTONIX) 40 MG tablet Take 1 tablet by mouth daily.     Syringe, Disposable, (BD TUBERCULIN SYRINGE) 1 ML MISC 6 syringes for 1 month for B12 injections twice a month 6 each 6   cephALEXin (KEFLEX) 500 MG capsule Take 1 capsule (500 mg total) by mouth 2 (two) times daily. (Patient not taking: Reported on 10/02/2019) 14 capsule 0   olopatadine (PATANOL) 0.1 % ophthalmic solution 1 drop 2 (two) times daily.     Facility-Administered Medications Prior to Visit  Medication Dose Route Frequency Provider Last Rate Last Dose   cyanocobalamin ((VITAMIN B-12)) injection 1,000 mcg  1,000 mcg Intramuscular Q30 days [name], MD   1,000 mcg at 01/19/17 1524     Per HPI unless specifically indicated in ROS section below Review of Systems Objective:    Temp 98 F (36.7 C) (Oral)    Ht 5' 1"  (1.549 m)    Wt 113 lb (51.3 kg)    BMI 21.35 kg/m   Wt Readings from Last 3 Encounters:  10/02/19 113 lb (51.3 kg)  11/15/18 114 lb (51.7 kg)  11/11/18 113 lb (51.3 kg)     Physical exam: Gen: alert, NAD, not ill appearing Pulm: speaks in complete sentences without increased work of breathing Psych: normal mood, normal thought content      Assessment & Plan:   Problem List Items Addressed This Visit    Acute non-recurrent sinusitis - Primary    Anticipate bacterial sinusitis given duration and progression of symptoms. Treat with augmentin antibiotic course. Pt declines anti-nausea med. Given nausea, fatigue, ?dyspnea, acute deterioration in setting of covid pandemic, reasonable to send for testing - reviewed testing instructions. Pt agrees with plan.        Relevant Medications   amoxicillin-clavulanate (AUGMENTIN) 875-125 MG tablet    Other Visit Diagnoses    Sore throat       Relevant Orders   Novel Coronavirus, NAA ([facility])       Meds ordered this encounter  Medications   amoxicillin-clavulanate (AUGMENTIN) 875-125 MG tablet    Sig: Take 1 tablet by mouth 2 (two) times daily for 10 days.    Dispense:  20 tablet    Refill:  0   Orders Placed This Encounter  Procedures   Novel Coronavirus, NAA ([facility])    Order Specific Question:   Is this test for diagnosis or screening    Answer:   Diagnosis of ill patient    Order Specific Question:   Symptomatic for COVID-19 as defined by CDC    Answer:   Yes    Order Specific Question:   Date of Symptom Onset    Answer:   10/01/2019    Order Specific Question:   Hospitalized for COVID-19    Answer:   No    Order Specific Question:   Admitted to ICU for COVID-19    Answer:   No    Order Specific Question:   Previously tested for COVID-19    Answer:   No    Order Specific Question:   Resident in a congregate (group) care setting    Answer:   No    Order Specific Question:   Is the patient student?    Answer:   No    Order Specific Question:   Employed in healthcare setting    Answer:   No    Order Specific Question:   Pregnant    Answer:   No    I discussed the assessment and treatment plan with the patient. The patient was provided an opportunity to ask questions and all were answered. The patient agreed with the plan and demonstrated an understanding of the instructions. The patient was advised to call back or seek an in-person evaluation if the symptoms worsen or if the condition fails to improve as anticipated.  Follow up plan: No follow-ups on file.  [name], MD

## 2019-10-03 LAB — NOVEL CORONAVIRUS, NAA: SARS-CoV-2, NAA: NOT DETECTED

## 2019-10-07 ENCOUNTER — Other Ambulatory Visit: Payer: Self-pay | Admitting: Family Medicine

## 2019-11-02 DIAGNOSIS — K5000 Crohn's disease of small intestine without complications: Secondary | ICD-10-CM | POA: Diagnosis not present

## 2019-11-06 ENCOUNTER — Other Ambulatory Visit: Payer: Self-pay | Admitting: Family Medicine

## 2019-11-19 ENCOUNTER — Other Ambulatory Visit: Payer: Self-pay | Admitting: Family Medicine

## 2019-11-19 DIAGNOSIS — D508 Other iron deficiency anemias: Secondary | ICD-10-CM

## 2019-11-19 DIAGNOSIS — I10 Essential (primary) hypertension: Secondary | ICD-10-CM

## 2019-11-19 DIAGNOSIS — E538 Deficiency of other specified B group vitamins: Secondary | ICD-10-CM

## 2019-11-19 DIAGNOSIS — K5010 Crohn's disease of large intestine without complications: Secondary | ICD-10-CM

## 2019-11-20 ENCOUNTER — Ambulatory Visit (INDEPENDENT_AMBULATORY_CARE_PROVIDER_SITE_OTHER): Payer: Medicare HMO

## 2019-11-20 ENCOUNTER — Other Ambulatory Visit: Payer: Self-pay

## 2019-11-20 ENCOUNTER — Other Ambulatory Visit (INDEPENDENT_AMBULATORY_CARE_PROVIDER_SITE_OTHER): Payer: Medicare HMO

## 2019-11-20 DIAGNOSIS — Z0000 Encounter for general adult medical examination without abnormal findings: Secondary | ICD-10-CM

## 2019-11-20 DIAGNOSIS — K5010 Crohn's disease of large intestine without complications: Secondary | ICD-10-CM

## 2019-11-20 DIAGNOSIS — E538 Deficiency of other specified B group vitamins: Secondary | ICD-10-CM | POA: Diagnosis not present

## 2019-11-20 DIAGNOSIS — D508 Other iron deficiency anemias: Secondary | ICD-10-CM

## 2019-11-20 DIAGNOSIS — I10 Essential (primary) hypertension: Secondary | ICD-10-CM | POA: Diagnosis not present

## 2019-11-20 LAB — CBC WITH DIFFERENTIAL/PLATELET
Basophils Absolute: 0 10*3/uL (ref 0.0–0.1)
Basophils Relative: 0.6 % (ref 0.0–3.0)
Eosinophils Absolute: 0.1 10*3/uL (ref 0.0–0.7)
Eosinophils Relative: 1.3 % (ref 0.0–5.0)
HCT: 39.2 % (ref 36.0–46.0)
Hemoglobin: 12.7 g/dL (ref 12.0–15.0)
Lymphocytes Relative: 36.5 % (ref 12.0–46.0)
Lymphs Abs: 2.2 10*3/uL (ref 0.7–4.0)
MCHC: 32.6 g/dL (ref 30.0–36.0)
MCV: 97 fl (ref 78.0–100.0)
Monocytes Absolute: 0.4 10*3/uL (ref 0.1–1.0)
Monocytes Relative: 6.1 % (ref 3.0–12.0)
Neutro Abs: 3.3 10*3/uL (ref 1.4–7.7)
Neutrophils Relative %: 55.5 % (ref 43.0–77.0)
Platelets: 326 10*3/uL (ref 150.0–400.0)
RBC: 4.04 Mil/uL (ref 3.87–5.11)
RDW: 15.7 % — ABNORMAL HIGH (ref 11.5–15.5)
WBC: 6 10*3/uL (ref 4.0–10.5)

## 2019-11-20 LAB — IBC PANEL
Iron: 110 ug/dL (ref 42–145)
Saturation Ratios: 22.4 % (ref 20.0–50.0)
Transferrin: 350 mg/dL (ref 212.0–360.0)

## 2019-11-20 LAB — LIPID PANEL
Cholesterol: 188 mg/dL (ref 0–200)
HDL: 77.6 mg/dL (ref >39.00)
LDL Cholesterol: 93 mg/dL (ref 0–99)
NonHDL: 110.54
Total CHOL/HDL Ratio: 2
Triglycerides: 90 mg/dL (ref 0.0–149.0)
VLDL: 18 mg/dL (ref 0.0–40.0)

## 2019-11-20 LAB — COMPREHENSIVE METABOLIC PANEL
ALT: 44 U/L — ABNORMAL HIGH (ref 0–35)
AST: 38 U/L — ABNORMAL HIGH (ref 0–37)
Albumin: 4.5 g/dL (ref 3.5–5.2)
Alkaline Phosphatase: 86 U/L (ref 39–117)
BUN: 19 mg/dL (ref 6–23)
CO2: 29 mEq/L (ref 19–32)
Calcium: 9.6 mg/dL (ref 8.4–10.5)
Chloride: 101 mEq/L (ref 96–112)
Creatinine, Ser: 0.88 mg/dL (ref 0.40–1.20)
GFR: 63.67 mL/min (ref >60.00)
Glucose, Bld: 89 mg/dL (ref 70–99)
Potassium: 3.7 mEq/L (ref 3.5–5.1)
Sodium: 137 mEq/L (ref 135–145)
Total Bilirubin: 0.8 mg/dL (ref 0.2–1.2)
Total Protein: 7.6 g/dL (ref 6.0–8.3)

## 2019-11-20 LAB — FERRITIN: Ferritin: 57.7 ng/mL (ref 10.0–291.0)

## 2019-11-20 LAB — VITAMIN B12: Vitamin B-12: 595 pg/mL (ref 211–911)

## 2019-11-20 LAB — MICROALBUMIN / CREATININE URINE RATIO
Creatinine,U: 166.7 mg/dL
Microalb Creat Ratio: 0.9 mg/g (ref 0.0–30.0)
Microalb, Ur: 1.5 mg/dL (ref 0.0–1.9)

## 2019-11-20 NOTE — Progress Notes (Signed)
Subjective:   Michelle Vasquez is a 69 y.o. female who presents for Medicare Annual (Subsequent) preventive examination.  Review of Systems: N/A   This visit is being conducted through telemedicine via telephone at the nurse health advisor's home address due to the COVID-19 pandemic. This patient has given me verbal consent via doximity to conduct this visit, patient states they are participating from their home address. Patient and myself are on the telephone call. There is no referral for this visit. Some vital signs may be absent or patient reported.    Patient identification: identified by name, DOB, and current address   Cardiac Risk Factors include: advanced age (>69mn, >>59women);hypertension     Objective:     Vitals: There were no vitals taken for this visit.  There is no height or weight on file to calculate BMI.  Advanced Directives 11/20/2019 11/11/2018 03/15/2015 03/01/2015 01/16/2013  Does Patient Have a Medical Advance Directive? No No No No Patient does not have advance directive;Patient would like information  Would patient like information on creating a medical advance directive? No - Patient declined Yes (MAU/Ambulatory/Procedural Areas - Information given) - - Referral made to social work  Pre-existing out of facility DNR order (yellow form or pink MOST form) - - - - No    Tobacco Social History   Tobacco Use  Smoking Status Former Smoker  . Packs/day: 0.25  . Years: 15.00  . Pack years: 3.75  . Types: Cigarettes  . Quit date: 10/26/1978  . Years since quitting: 41.0  Smokeless Tobacco Never Used     Counseling given: Not Answered   Clinical Intake:  Pre-visit preparation completed: Yes  Pain : 0-10 Pain Score: 8  Pain Type: Chronic pain Pain Location: Abdomen Pain Descriptors / Indicators: Cramping Pain Onset: More than a month ago Pain Frequency: Intermittent     Diabetes: No  How often do you need to have someone help you when you read  instructions, pamphlets, or other written materials from your doctor or pharmacy?: 1 - Never What is the last grade level you completed in school?: 12th  Interpreter Needed?: No  Information entered by :: CJohnson, LPN  Past Medical History:  Diagnosis Date  . Allergy    seasonal  . Anxiety and depression 07/03/2011  . B12 deficiency   . Blood transfusion without reported diagnosis   . Central hearing loss   . Crohn disease ([facility]    s/p colectomy on 6MP, ?bacterial overgrowth  . Fatty liver 12/15/11  . History of small bowel obstruction   . HTN (hypertension)   . Internal hemorrhoids without mention of complication   . Osteomalacia, unspecified   . Perennial allergic rhinitis with seasonal variation   . Unspecified deficiency anemia   . Unspecified polyarthropathy or polyarthritis, multiple sites    Past Surgical History:  Procedure Laterality Date  . APPENDECTOMY    . COLONOSCOPY  02/25/05   Internal hemms; crohns; narrow anast  . COLONOSCOPY  02/26/10   Prior right hemicolectomy o/w benign (Dr. BOlevia Perches  . COLONOSCOPY  02/2015   benign biopsies, crohn's with stenotic stricture at anastomosis (Olevia Perches  . DEXA  07/06/2011   normal T score -1.0 femur, spine  . ESOPHAGOGASTRODUODENOSCOPY  02/25/05   Dilated stricture  . ESOPHAGOGASTRODUODENOSCOPY  10/2017   esophageal stenosis dilated, duodenal diverticuluc ([place])  . HEMICOLECTOMY  1980   all small intestine, none large  . OTHER SURGICAL HISTORY  1960s, 1978, 1980   ileal resection x  3 and subtotal colon resection  . TOTAL ABDOMINAL HYSTERECTOMY  1975   ovaries out as well  . UPPER GASTROINTESTINAL ENDOSCOPY     Family History  Problem Relation Age of Onset  . Crohn's disease Father        colostomy  . Hypertension Mother   . Heart disease Mother 47  . Alcohol abuse Brother   . Cirrhosis Brother   . Crohn's disease Sister   . Crohn's disease Paternal Aunt        x 3  . Crohn's disease Other        neice x 2  .  Colon cancer Neg Hx   . Cancer Neg Hx   . Diabetes Neg Hx   . Rectal cancer Neg Hx   . Stomach cancer Neg Hx   . Esophageal cancer Neg Hx   . Breast cancer Neg Hx    Social History   Socioeconomic History  . Marital status: Divorced    Spouse name: Not on file  . Number of children: 3  . Years of education: Not on file  . Highest education level: Not on file  Occupational History  . Occupation: Medical Transcription    Employer: OTHER    Comment: [employer]  Tobacco Use  . Smoking status: Former Smoker    Packs/day: 0.25    Years: 15.00    Pack years: 3.75    Types: Cigarettes    Quit date: 10/26/1978    Years since quitting: 41.0  . Smokeless tobacco: Never Used  Substance and Sexual Activity  . Alcohol use: No  . Drug use: No  . Sexual activity: Not on file  Other Topics Concern  . Not on file  Social History Narrative   Caffeine: 2 cups coffee/day   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, no regular activity   Diet: good water, fruits/vegetables occasionally   Social Determinants of Health   Financial Resource Strain: Low Risk   . Difficulty of Paying Living Expenses: Not hard at all  Food Insecurity: No Food Insecurity  . Worried About Charity fundraiser in the Last Year: Never true  . Ran Out of Food in the Last Year: Never true  Transportation Needs: No Transportation Needs  . Lack of Transportation (Medical): No  . Lack of Transportation (Non-Medical): No  Physical Activity: Inactive  . Days of Exercise per Week: 0 days  . Minutes of Exercise per Session: 0 min  Stress: No Stress Concern Present  . Feeling of Stress : Not at all  Social Connections:   . Frequency of Communication with Friends and Family: Not on file  . Frequency of Social Gatherings with Friends and Family: Not on file  . Attends Religious Services: Not on file  . Active Member of Clubs or Organizations: Not on file  . Attends  Archivist Meetings: Not on file  . Marital Status: Not on file    Outpatient Encounter Medications as of 11/20/2019  Medication Sig  . amLODipine (NORVASC) 5 MG tablet TAKE 1 TABLET EVERY DAY  . cetirizine (ZYRTEC) 10 MG tablet TAKE ONE TABLET DAILY AS DIRECTED  . Cholecalciferol (VITAMIN D) 50 MCG (2000 UT) CAPS Take 1 capsule (2,000 Units total) by mouth daily.  . cyanocobalamin (,VITAMIN B-12,) 1000 MCG/ML injection INJECT 1 ML EVERY 2 WEEKS AS DIRECTED  . FEROSUL 325 (65 Fe) MG tablet TAKE 1 TABLET BY  MOUTH EVERY OTHER DAY  . LINZESS 72 MCG capsule TAKE 1 CAPSULE BY MOUTH ONCE DAILY BEFORE BREAKFAST  . mercaptopurine (PURINETHOL) 50 MG tablet TAKE 1 TABLET BY MOUTH DAILY 1 HOUR BEFORE OR 2 HOURS AFTER MEALS ON AN EMPTY STOMACH.  Marland Kitchen olopatadine (PATANOL) 0.1 % ophthalmic solution 1 drop 2 (two) times daily.  . pantoprazole (PROTONIX) 40 MG tablet Take 1 tablet by mouth daily.  . Syringe, Disposable, (BD TUBERCULIN SYRINGE) 1 ML MISC 6 syringes for 1 month for B12 injections twice a month  . cephALEXin (KEFLEX) 500 MG capsule Take 1 capsule (500 mg total) by mouth 2 (two) times daily. (Patient not taking: Reported on 11/20/2019)   Facility-Administered Encounter Medications as of 11/20/2019  Medication  . cyanocobalamin ((VITAMIN B-12)) injection 1,000 mcg    Activities of Daily Living In your present state of health, do you have any difficulty performing the following activities: 11/20/2019  Hearing? Y  Comment hearing loss noted  Vision? N  Difficulty concentrating or making decisions? N  Walking or climbing stairs? N  Dressing or bathing? N  Doing errands, shopping? N  Preparing Food and eating ? N  Using the Toilet? N  In the past six months, have you accidently leaked urine? N  Do you have problems with loss of bowel control? N  Managing your Medications? N  Managing your Finances? N  Housekeeping or managing your Housekeeping? N  Some recent data might be hidden      Patient Care Team: [name], MD as PCP - General    Assessment:   This is a routine wellness examination for Dajanay.  Exercise Activities and Dietary recommendations Current Exercise Habits: Home exercise routine, Type of exercise: walking, Time (Minutes): 20, Frequency (Times/Week): 7, Weekly Exercise (Minutes/Week): 140, Intensity: Mild, Exercise limited by: None identified  Goals    . Patient Stated     Starting 11/11/2018, I will continue to take medications as prescribed.     . Patient Stated     11/20/2019, I will maintain and continue medications as prescribed.        Fall Risk Fall Risk  11/20/2019 11/11/2018  Falls in the past year? 0 0  Number falls in past yr: 0 -  Injury with Fall? 0 -  Risk for fall due to : Medication side effect -  Follow up Falls prevention discussed;Falls evaluation completed -   Is the patient's home free of loose throw rugs in walkways, pet beds, electrical cords, etc?   yes      Grab bars in the bathroom? no      Handrails on the stairs?   no      Adequate lighting?   yes  Timed Get Up and Go performed: N/A  Depression Screen PHQ 2/9 Scores 11/20/2019 11/11/2018 11/09/2017  PHQ - 2 Score 2 0 0  PHQ- 9 Score 2 0 -     Cognitive Function MMSE - Mini Mental State Exam 11/20/2019 11/11/2018  Orientation to time 5 5  Orientation to Place 5 5  Registration 3 3  Attention/ Calculation 5 0  Recall 3 3  Language- name 2 objects - 0  Language- repeat 1 1  Language- follow 3 step command - 3  Language- read & follow direction - 0  Write a sentence - 0  Copy design - 0  Total score - 20  Mini Cog  Mini-Cog screen was completed. Maximum score is 22. A value of 0 denotes this part of  the MMSE was not completed or the patient failed this part of the Mini-Cog screening.       Immunization History  Administered Date(s) Administered  . Fluad Quad(high Dose 65+) 09/07/2019  . Hepatitis B 04/23/2013, 05/01/2013  . Hepatitis B,  ped/adol 09/25/2013  . Influenza Whole 07/26/2008  . Influenza,inj,Quad PF,6+ Mos 08/13/2014, 09/01/2018  . Influenza-Unspecified 08/04/2016, 07/30/2017  . Pneumococcal Conjugate-13 11/09/2017  . Pneumococcal Polysaccharide-23 03/21/2013  . Td 08/07/2004  . Tdap 06/11/2014  . Zoster 08/24/2016    Qualifies for Shingles Vaccine? yes  Screening Tests Health Maintenance  Topic Date Due  . DTAP VACCINES (1) 10/21/1951  . PNA vac Low Risk Adult (2 of 2 - PPSV23) 11/09/2018  . MAMMOGRAM  11/19/2020 (Originally 03/08/2019)  . COLONOSCOPY  03/14/2020  . DTaP/Tdap/Td (3 - Td) 06/11/2024  . TETANUS/TDAP  06/11/2024  . INFLUENZA VACCINE  Completed  . DEXA SCAN  Completed  . Hepatitis C Screening  Completed    Cancer Screenings: Lung: Low Dose CT Chest recommended if Age 59-80 years, 30 pack-year currently smoking OR have quit w/in 15years. Patient does not qualify. Breast:  Up to date on Mammogram? No, declined due to COVID   Up to date of Bone Density/Dexa? Yes, completed 03/07/2018 Colorectal: completed 03/15/2015  Additional Screenings:  Hepatitis C Screening: 07/26/2015     Plan:    Patient will maintain and continue medications as prescribed.    I have personally reviewed and noted the following in the patient's chart:   . Medical and social history . Use of alcohol, tobacco or illicit drugs  . Current medications and supplements . Functional ability and status . Nutritional status . Physical activity . Advanced directives . List of other physicians . Hospitalizations, surgeries, and ER visits in previous 12 months . Vitals . Screenings to include cognitive, depression, and falls . Referrals and appointments  In addition, I have reviewed and discussed with patient certain preventive protocols, quality metrics, and best practice recommendations. A written personalized care plan for preventive services as well as general preventive health recommendations were provided to  patient.     Andrez Grime, LPN  06/27/4096

## 2019-11-20 NOTE — Patient Instructions (Signed)
Michelle Vasquez , Thank you for taking time to come for your Medicare Wellness Visit. I appreciate your ongoing commitment to your health goals. Please review the following plan we discussed and let me know if I can assist you in the future.   Screening recommendations/referrals: Colonoscopy: Up to date, completed 03/15/2015 Mammogram: declined due to COVID Bone Density: Up to date, completed 03/07/2018 Recommended yearly ophthalmology/optometry visit for glaucoma screening and checkup Recommended yearly dental visit for hygiene and checkup  Vaccinations: Influenza vaccine: Up to date, completed 09/07/2019 Pneumococcal vaccine: will get at physical Tdap vaccine: Up to date, completed 06/11/2014 Shingles vaccine: discussed    Advanced directives: Advance directive discussed with you today. Even though you declined this today please call our office should you change your mind and we can give you the proper paperwork for you to fill out.  Conditions/risks identified: hypertension  Next appointment: 11/24/2019 @ 7:30 am    Preventive Care 70 Years and Older, Female Preventive care refers to lifestyle choices and visits with your health care provider that can promote health and wellness. What does preventive care include?  A yearly physical exam. This is also called an annual well check.  Dental exams once or twice a year.  Routine eye exams. Ask your health care provider how often you should have your eyes checked.  Personal lifestyle choices, including:  Daily care of your teeth and gums.  Regular physical activity.  Eating a healthy diet.  Avoiding tobacco and drug use.  Limiting alcohol use.  Practicing safe sex.  Taking low-dose aspirin every day.  Taking vitamin and mineral supplements as recommended by your health care provider. What happens during an annual well check? The services and screenings done by your health care provider during your annual well check will depend  on your age, overall health, lifestyle risk factors, and family history of disease. Counseling  Your health care provider may ask you questions about your:  Alcohol use.  Tobacco use.  Drug use.  Emotional well-being.  Home and relationship well-being.  Sexual activity.  Eating habits.  History of falls.  Memory and ability to understand (cognition).  Work and work Statistician.  Reproductive health. Screening  You may have the following tests or measurements:  Height, weight, and BMI.  Blood pressure.  Lipid and cholesterol levels. These may be checked every 5 years, or more frequently if you are over 64 years old.  Skin check.  Lung cancer screening. You may have this screening every year starting at age 70 if you have a 30-pack-year history of smoking and currently smoke or have quit within the past 15 years.  Fecal occult blood test (FOBT) of the stool. You may have this test every year starting at age 70.  Flexible sigmoidoscopy or colonoscopy. You may have a sigmoidoscopy every 5 years or a colonoscopy every 10 years starting at age 70.  Hepatitis C blood test.  Hepatitis B blood test.  Sexually transmitted disease (STD) testing.  Diabetes screening. This is done by checking your blood sugar (glucose) after you have not eaten for a while (fasting). You may have this done every 1-3 years.  Bone density scan. This is done to screen for osteoporosis. You may have this done starting at age 70.  Mammogram. This may be done every 1-2 years. Talk to your health care provider about how often you should have regular mammograms. Talk with your health care provider about your test results, treatment options, and if necessary, the  need for more tests. Vaccines  Your health care provider may recommend certain vaccines, such as:  Influenza vaccine. This is recommended every year.  Tetanus, diphtheria, and acellular pertussis (Tdap, Td) vaccine. You may need a Td  booster every 10 years.  Zoster vaccine. You may need this after age 70.  Pneumococcal 13-valent conjugate (PCV13) vaccine. One dose is recommended after age 70.  Pneumococcal polysaccharide (PPSV23) vaccine. One dose is recommended after age 70. Talk to your health care provider about which screenings and vaccines you need and how often you need them. This information is not intended to replace advice given to you by your health care provider. Make sure you discuss any questions you have with your health care provider. Document Released: 11/08/2015 Document Revised: 07/01/2016 Document Reviewed: 08/13/2015 Elsevier Interactive Patient Education  2017 [place] Prevention in the Home Falls can cause injuries. They can happen to people of all ages. There are many things you can do to make your home safe and to help prevent falls. What can I do on the outside of my home?  Regularly fix the edges of walkways and driveways and fix any cracks.  Remove anything that might make you trip as you walk through a door, such as a raised step or threshold.  Trim any bushes or trees on the path to your home.  Use bright outdoor lighting.  Clear any walking paths of anything that might make someone trip, such as rocks or tools.  Regularly check to see if handrails are loose or broken. Make sure that both sides of any steps have handrails.  Any raised decks and porches should have guardrails on the edges.  Have any leaves, snow, or ice cleared regularly.  Use sand or salt on walking paths during winter.  Clean up any spills in your garage right away. This includes oil or grease spills. What can I do in the bathroom?  Use night lights.  Install grab bars by the toilet and in the tub and shower. Do not use towel bars as grab bars.  Use non-skid mats or decals in the tub or shower.  If you need to sit down in the shower, use a plastic, non-slip stool.  Keep the floor dry. Clean up  any water that spills on the floor as soon as it happens.  Remove soap buildup in the tub or shower regularly.  Attach bath mats securely with double-sided non-slip rug tape.  Do not have throw rugs and other things on the floor that can make you trip. What can I do in the bedroom?  Use night lights.  Make sure that you have a light by your bed that is easy to reach.  Do not use any sheets or blankets that are too big for your bed. They should not hang down onto the floor.  Have a firm chair that has side arms. You can use this for support while you get dressed.  Do not have throw rugs and other things on the floor that can make you trip. What can I do in the kitchen?  Clean up any spills right away.  Avoid walking on wet floors.  Keep items that you use a lot in easy-to-reach places.  If you need to reach something above you, use a strong step stool that has a grab bar.  Keep electrical cords out of the way.  Do not use floor polish or wax that makes floors slippery. If you must use wax,  use non-skid floor wax.  Do not have throw rugs and other things on the floor that can make you trip. What can I do with my stairs?  Do not leave any items on the stairs.  Make sure that there are handrails on both sides of the stairs and use them. Fix handrails that are broken or loose. Make sure that handrails are as long as the stairways.  Check any carpeting to make sure that it is firmly attached to the stairs. Fix any carpet that is loose or worn.  Avoid having throw rugs at the top or bottom of the stairs. If you do have throw rugs, attach them to the floor with carpet tape.  Make sure that you have a light switch at the top of the stairs and the bottom of the stairs. If you do not have them, ask someone to add them for you. What else can I do to help prevent falls?  Wear shoes that:  Do not have high heels.  Have rubber bottoms.  Are comfortable and fit you well.  Are  closed at the toe. Do not wear sandals.  If you use a stepladder:  Make sure that it is fully opened. Do not climb a closed stepladder.  Make sure that both sides of the stepladder are locked into place.  Ask someone to hold it for you, if possible.  Clearly mark and make sure that you can see:  Any grab bars or handrails.  First and last steps.  Where the edge of each step is.  Use tools that help you move around (mobility aids) if they are needed. These include:  Canes.  Walkers.  Scooters.  Crutches.  Turn on the lights when you go into a dark area. Replace any light bulbs as soon as they burn out.  Set up your furniture so you have a clear path. Avoid moving your furniture around.  If any of your floors are uneven, fix them.  If there are any pets around you, be aware of where they are.  Review your medicines with your doctor. Some medicines can make you feel dizzy. This can increase your chance of falling. Ask your doctor what other things that you can do to help prevent falls. This information is not intended to replace advice given to you by your health care provider. Make sure you discuss any questions you have with your health care provider. Document Released: 08/08/2009 Document Revised: 03/19/2016 Document Reviewed: 11/16/2014 Elsevier Interactive Patient Education  2017 [employer].

## 2019-11-20 NOTE — Progress Notes (Signed)
PCP notes:  Health Maintenance: Mammogram- declined due to COVID Needs pneumovax 23   Abnormal Screenings: none   Patient concerns: none   Nurse concerns: none   Next PCP appt.: 11/24/2019 @ 7:30 am

## 2019-11-21 ENCOUNTER — Telehealth: Payer: Self-pay

## 2019-11-21 ENCOUNTER — Other Ambulatory Visit: Payer: Self-pay

## 2019-11-21 ENCOUNTER — Emergency Department
Admission: EM | Admit: 2019-11-21 | Discharge: 2019-11-21 | Disposition: A | Discharge status: Home / Self Care | Payer: Medicare HMO | Attending: Student in an Organized Health Care Education/Training Program | Admitting: Student in an Organized Health Care Education/Training Program

## 2019-11-21 ENCOUNTER — Emergency Department: Payer: Medicare HMO

## 2019-11-21 ENCOUNTER — Ambulatory Visit: Payer: Medicare HMO

## 2019-11-21 DIAGNOSIS — R1011 Right upper quadrant pain: Secondary | ICD-10-CM | POA: Diagnosis not present

## 2019-11-21 DIAGNOSIS — Z87891 Personal history of nicotine dependence: Secondary | ICD-10-CM | POA: Diagnosis not present

## 2019-11-21 DIAGNOSIS — I10 Essential (primary) hypertension: Secondary | ICD-10-CM | POA: Diagnosis not present

## 2019-11-21 DIAGNOSIS — K760 Fatty (change of) liver, not elsewhere classified: Secondary | ICD-10-CM | POA: Diagnosis not present

## 2019-11-21 DIAGNOSIS — Z79899 Other long term (current) drug therapy: Secondary | ICD-10-CM | POA: Diagnosis not present

## 2019-11-21 DIAGNOSIS — K828 Other specified diseases of gallbladder: Secondary | ICD-10-CM | POA: Diagnosis not present

## 2019-11-21 LAB — COMPREHENSIVE METABOLIC PANEL
ALT: 57 U/L — ABNORMAL HIGH (ref 0–44)
AST: 48 U/L — ABNORMAL HIGH (ref 15–41)
Albumin: 4.2 g/dL (ref 3.5–5.0)
Alkaline Phosphatase: 76 U/L (ref 38–126)
Anion gap: 8 (ref 5–15)
BUN: 14 mg/dL (ref 8–23)
CO2: 25 mmol/L (ref 22–32)
Calcium: 9.6 mg/dL (ref 8.9–10.3)
Chloride: 107 mmol/L (ref 98–111)
Creatinine, Ser: 0.74 mg/dL (ref 0.44–1.00)
GFR calc Af Amer: >60 mL/min (ref >60)
GFR calc non Af Amer: >60 mL/min (ref >60)
Glucose, Bld: 87 mg/dL (ref 70–99)
Potassium: 4 mmol/L (ref 3.5–5.1)
Sodium: 140 mmol/L (ref 135–145)
Total Bilirubin: 0.9 mg/dL (ref 0.3–1.2)
Total Protein: 7.7 g/dL (ref 6.5–8.1)

## 2019-11-21 LAB — URINALYSIS, COMPLETE (UACMP) WITH MICROSCOPIC
Bacteria, UA: NONE SEEN
Bilirubin Urine: NEGATIVE
Glucose, UA: NEGATIVE mg/dL
Ketones, ur: NEGATIVE mg/dL
Leukocytes,Ua: NEGATIVE
Nitrite: NEGATIVE
Protein, ur: NEGATIVE mg/dL
Specific Gravity, Urine: 1.013 (ref 1.005–1.030)
pH: 5 (ref 5.0–8.0)

## 2019-11-21 LAB — CBC
HCT: 37 % (ref 36.0–46.0)
Hemoglobin: 12.3 g/dL (ref 12.0–15.0)
MCH: 31.8 pg (ref 26.0–34.0)
MCHC: 33.2 g/dL (ref 30.0–36.0)
MCV: 95.6 fL (ref 80.0–100.0)
Platelets: 306 10*3/uL (ref 150–400)
RBC: 3.87 MIL/uL (ref 3.87–5.11)
RDW: 15.3 % (ref 11.5–15.5)
WBC: 5.5 10*3/uL (ref 4.0–10.5)
nRBC: 0 % (ref 0.0–0.2)

## 2019-11-21 LAB — LIPASE, BLOOD: Lipase: 51 U/L (ref 11–51)

## 2019-11-21 MED ORDER — SODIUM CHLORIDE 0.9 % IV BOLUS: 500.0000 mL | Freq: Once | INTRAVENOUS | Status: DC

## 2019-11-21 MED ORDER — ONDANSETRON HCL 4 MG PO TABS: 4.0000 mg | ORAL_TABLET | Freq: Every day | ORAL | 0 refills | Status: DC | PRN

## 2019-11-21 MED ORDER — MORPHINE SULFATE (PF) 4 MG/ML IV SOLN: 4.0000 mg | INTRAVENOUS | Status: DC | PRN

## 2019-11-21 MED ORDER — ONDANSETRON HCL 4 MG/2ML IJ SOLN: 4.0000 mg | Freq: Once | INTRAMUSCULAR | Status: DC

## 2019-11-21 MED ORDER — IOHEXOL 300 MG/ML  SOLN
75.0000 mL | Freq: Once | INTRAMUSCULAR | Status: AC | PRN
  Administered 2019-11-21: 75 mL via INTRAVENOUS
  Filled 2019-11-21: qty 75

## 2019-11-21 MED ORDER — TRAMADOL HCL 50 MG PO TABS
50.0000 mg | ORAL_TABLET | Freq: Once | ORAL | Status: AC
  Administered 2019-11-21: 18:00:00 50 mg via ORAL
  Filled 2019-11-21: qty 1

## 2019-11-21 MED ORDER — TRAMADOL HCL 50 MG PO TABS: 50.0000 mg | ORAL_TABLET | Freq: Four times a day (QID) | ORAL | 0 refills | Status: DC | PRN

## 2019-11-21 NOTE — Telephone Encounter (Signed)
Noted. Pt currently still in the ER. plz call for update tomorrow.

## 2019-11-21 NOTE — ED Triage Notes (Signed)
RUQ pain X 5-6 days, worse after eating. Dull in nature. Nausea and decreased appetite. Pt alert and oriented X4, cooperative, RR even and unlabored, color WNL. Pt in NAD.

## 2019-11-21 NOTE — ED Notes (Signed)
Transported to CT 

## 2019-11-21 NOTE — ED Provider Notes (Signed)
[facility] Emergency Department Provider Note    First MD Initiated Contact with Patient 11/21/19 1621     (approximate)  I have reviewed the triage vital signs and the nursing notes.   HISTORY  Chief Complaint Abdominal Pain    HPI Michelle Vasquez is a 69 y.o. female the below listed past medical history on 6-mercaptopurine and immunologic presents the ER for moderate to severe right-sided abdominal pain radiating through to her back.  Had similar pain roughly 6 months ago.  No measured fevers.  Has had decreased p.o. intake.  Is still passing gas and moving her bowels.  States pain is sharp in nature.  Feels like this is a different type of pain from her previous Crohn's flares.    Past Medical History:  Diagnosis Date  . Allergy    seasonal  . Anxiety and depression 07/03/2011  . B12 deficiency   . Blood transfusion without reported diagnosis   . Central hearing loss   . Crohn disease ([place])    s/p colectomy on 6MP, ?bacterial overgrowth  . Fatty liver 12/15/11  . History of small bowel obstruction   . HTN (hypertension)   . Internal hemorrhoids without mention of complication   . Osteomalacia, unspecified   . Perennial allergic rhinitis with seasonal variation   . Unspecified deficiency anemia   . Unspecified polyarthropathy or polyarthritis, multiple sites    Family History  Problem Relation Age of Onset  . Crohn's disease Father        colostomy  . Hypertension Mother   . Heart disease Mother 20  . Alcohol abuse Brother   . Cirrhosis Brother   . Crohn's disease Sister   . Crohn's disease Paternal Aunt        x 3  . Crohn's disease Other        neice x 2  . Colon cancer Neg Hx   . Cancer Neg Hx   . Diabetes Neg Hx   . Rectal cancer Neg Hx   . Stomach cancer Neg Hx   . Esophageal cancer Neg Hx   . Breast cancer Neg Hx    Past Surgical History:  Procedure Laterality Date  . APPENDECTOMY    . COLONOSCOPY  02/25/05   Internal  hemms; crohns; narrow anast  . COLONOSCOPY  02/26/10   Prior right hemicolectomy o/w benign (Dr. Olevia Perches)  . COLONOSCOPY  02/2015   benign biopsies, crohn's with stenotic stricture at anastomosis Olevia Perches)  . DEXA  07/06/2011   normal T score -1.0 femur, spine  . ESOPHAGOGASTRODUODENOSCOPY  02/25/05   Dilated stricture  . ESOPHAGOGASTRODUODENOSCOPY  10/2017   esophageal stenosis dilated, duodenal diverticuluc (Nandigam)  . HEMICOLECTOMY  1980   all small intestine, none large  . OTHER SURGICAL HISTORY  1960s, 1978, 1980   ileal resection x 3 and subtotal colon resection  . TOTAL ABDOMINAL HYSTERECTOMY  1975   ovaries out as well  . UPPER GASTROINTESTINAL ENDOSCOPY     Patient Active Problem List   Diagnosis Date Noted  . UTI (urinary tract infection) 03/09/2019  . Insomnia 11/15/2018  . Sensorineural hearing loss, bilateral 11/15/2018  . Osteopenia 03/13/2018  . Tremor 01/31/2018  . Paresthesia of hand, bilateral 01/31/2018  . Acute non-recurrent sinusitis 10/16/2015  . Hematuria 10/16/2015  . Verruca vulgaris 06/25/2015  . Dry eyes due to decreased tear production 06/11/2014  . Scalp irritation 06/08/2013  . Anxiety and depression 07/03/2011  . Healthcare maintenance 06/05/2011  .  Perennial allergic rhinitis with seasonal variation   . Unspecified family circumstance   . Iron deficiency anemia 04/15/2010  . Vitamin B12 deficiency 04/10/2009  . POLYARTHRITIS NOS, MULTIPLE SITES 04/10/2009  . OSTEOMALACIA, UNSPECIFIED 10/04/2008  . Essential hypertension 08/27/2008  . Crohn's disease ([place]) 01/19/2007  . MENOPAUSE, SURGICAL 01/19/2007  . S/P partial colectomy 01/19/2007      Prior to Admission medications   Medication Sig Start Date End Date Taking? Authorizing Provider  amLODipine (NORVASC) 5 MG tablet TAKE 1 TABLET EVERY DAY 11/07/19   [name], MD  cephALEXin (KEFLEX) 500 MG capsule Take 1 capsule (500 mg total) by mouth 2 (two) times daily. Patient not taking:  Reported on 11/20/2019 03/30/19   [name], MD  cetirizine (ZYRTEC) 10 MG tablet TAKE ONE TABLET DAILY AS DIRECTED 08/05/17   [name], MD  Cholecalciferol (VITAMIN D) 50 MCG (2000 UT) CAPS Take 1 capsule (2,000 Units total) by mouth daily. 11/15/18   [name], MD  cyanocobalamin (,VITAMIN B-12,) 1000 MCG/ML injection INJECT 1 ML EVERY 2 WEEKS AS DIRECTED 11/28/18   Mauri Pole, MD  FEROSUL 325 (65 Fe) MG tablet TAKE 1 TABLET BY MOUTH EVERY OTHER DAY 10/10/19   [name], MD  LINZESS 72 MCG capsule TAKE 1 CAPSULE BY MOUTH ONCE DAILY BEFORE BREAKFAST 12/09/18   Nandigam, Venia Minks, MD  mercaptopurine (PURINETHOL) 50 MG tablet TAKE 1 TABLET BY MOUTH DAILY 1 HOUR BEFORE OR 2 HOURS AFTER MEALS ON AN EMPTY STOMACH. 07/25/18   Mauri Pole, MD  olopatadine (PATANOL) 0.1 % ophthalmic solution 1 drop 2 (two) times daily.    [provider]  ondansetron (ZOFRAN) 4 MG tablet Take 1 tablet (4 mg total) by mouth daily as needed. 11/21/19 11/20/20  Merlyn Lot, MD  pantoprazole (PROTONIX) 40 MG tablet Take 1 tablet by mouth daily. 03/02/19   [provider]  Syringe, Disposable, (BD TUBERCULIN SYRINGE) 1 ML MISC 6 syringes for 1 month for B12 injections twice a month 07/21/17   Nandigam, Venia Minks, MD  traMADol (ULTRAM) 50 MG tablet Take 1 tablet (50 mg total) by mouth every 6 (six) hours as needed. 11/21/19 11/20/20  Merlyn Lot, MD    Allergies Celexa Myles Lipps hydrobromide]    Social History Social History   Tobacco Use  . Smoking status: Former Smoker    Packs/day: 0.25    Years: 15.00    Pack years: 3.75    Types: Cigarettes    Quit date: 10/26/1978    Years since quitting: 41.0  . Smokeless tobacco: Never Used  Substance Use Topics  . Alcohol use: No  . Drug use: No    Review of Systems Patient denies headaches, rhinorrhea, blurry vision, numbness, shortness of breath, chest pain, edema, cough, abdominal pain, nausea,  vomiting, diarrhea, dysuria, fevers, rashes or hallucinations unless otherwise stated above in HPI. ____________________________________________   PHYSICAL EXAM:  VITAL SIGNS: Vitals:   11/21/19 1239 11/21/19 1822  BP: (!) 145/64 (!) 162/75  Pulse: 81 73  Resp: 16 20  Temp: 98.5 F (36.9 C) 98.1 F (36.7 C)  SpO2: 94% 99%    Constitutional: Alert and oriented.  Eyes: Conjunctivae are normal.  Head: Atraumatic. Nose: No congestion/rhinnorhea. Mouth/Throat: Mucous membranes are moist.   Neck: No stridor. Painless ROM.  Cardiovascular: Normal rate, regular rhythm. Grossly normal heart sounds.  Good peripheral circulation. Respiratory: Normal respiratory effort.  No retractions. Lungs CTAB. Gastrointestinal: Soft with ttp in RUQ. No distention. No abdominal bruits. No  CVA tenderness. Genitourinary:  Musculoskeletal: No lower extremity tenderness nor edema.  No joint effusions. Neurologic:  Normal speech and language. No gross focal neurologic deficits are appreciated. No facial droop Skin:  Skin is warm, dry and intact. No rash noted. Psychiatric: Mood and affect are normal. Speech and behavior are normal.  ____________________________________________   LABS (all labs ordered are listed, but only abnormal results are displayed)  Results for orders placed or performed during the hospital encounter of 11/21/19 (from the past 24 hour(s))  Urinalysis, Complete w Microscopic     Status: Abnormal   Collection Time: 11/21/19 12:37 PM  Result Value Ref Range   Color, Urine YELLOW (A) YELLOW   APPearance CLEAR (A) CLEAR   Specific Gravity, Urine 1.013 1.005 - 1.030   pH 5.0 5.0 - 8.0   Glucose, UA NEGATIVE NEGATIVE mg/dL   Hgb urine dipstick SMALL (A) NEGATIVE   Bilirubin Urine NEGATIVE NEGATIVE   Ketones, ur NEGATIVE NEGATIVE mg/dL   Protein, ur NEGATIVE NEGATIVE mg/dL   Nitrite NEGATIVE NEGATIVE   Leukocytes,Ua NEGATIVE NEGATIVE   RBC / HPF 0-5 0 - 5 RBC/hpf   WBC, UA 0-5 0  - 5 WBC/hpf   Bacteria, UA NONE SEEN NONE SEEN   Squamous Epithelial / LPF 0-5 0 - 5   Mucus PRESENT   Lipase, blood     Status: None   Collection Time: 11/21/19 12:38 PM  Result Value Ref Range   Lipase 51 11 - 51 U/L  Comprehensive metabolic panel     Status: Abnormal   Collection Time: 11/21/19 12:38 PM  Result Value Ref Range   Sodium 140 135 - 145 mmol/L   Potassium 4.0 3.5 - 5.1 mmol/L   Chloride 107 98 - 111 mmol/L   CO2 25 22 - 32 mmol/L   Glucose, Bld 87 70 - 99 mg/dL   BUN 14 8 - 23 mg/dL   Creatinine, Ser 0.74 0.44 - 1.00 mg/dL   Calcium 9.6 8.9 - 10.3 mg/dL   Total Protein 7.7 6.5 - 8.1 g/dL   Albumin 4.2 3.5 - 5.0 g/dL   AST 48 (H) 15 - 41 U/L   ALT 57 (H) 0 - 44 U/L   Alkaline Phosphatase 76 38 - 126 U/L   Total Bilirubin 0.9 0.3 - 1.2 mg/dL   GFR calc non Af Amer >60 >60 mL/min   GFR calc Af Amer >60 >60 mL/min   Anion gap 8 5 - 15  CBC     Status: None   Collection Time: 11/21/19 12:38 PM  Result Value Ref Range   WBC 5.5 4.0 - 10.5 K/uL   RBC 3.87 3.87 - 5.11 MIL/uL   Hemoglobin 12.3 12.0 - 15.0 g/dL   HCT 37.0 36.0 - 46.0 %   MCV 95.6 80.0 - 100.0 fL   MCH 31.8 26.0 - 34.0 pg   MCHC 33.2 30.0 - 36.0 g/dL   RDW 15.3 11.5 - 15.5 %   Platelets 306 150 - 400 K/uL   nRBC 0.0 0.0 - 0.2 %   ____________________________________________  EKG My review and personal interpretation at Time: 12:36   Indication: abd pain  Rate: 75  Rhythm: sinus Axis: normal Other: normal intervals, no stemi ____________________________________________  RADIOLOGY  I personally reviewed all radiographic images ordered to evaluate for the above acute complaints and reviewed radiology reports and findings.  These findings were personally discussed with the patient.  Please see medical record for radiology report.  ____________________________________________  PROCEDURES  Procedure(s) performed:  Procedures    Critical Care performed:  no ____________________________________________   INITIAL IMPRESSION / ASSESSMENT AND PLAN / ED COURSE  Pertinent labs & imaging results that were available during my care of the patient were reviewed by me and considered in my medical decision making (see chart for details).   DDX: crohns flare, fistula, abscess, colitis, cholelithiasis, pancreatitis  MOESHA SARCHET is a 69 y.o. who presents to the ED with symptoms as described above.  Patient well-appearing clinically afebrile in no acute distress.  Will provide IV fluids.  Ultrasound was ordered at triage but given her history of IBD I do feel that CT imaging is clinically indicated.  Clinical Course as of Nov 20 1854  Tue Nov 21, 2019  1725 CT imaging does not show any evidence of perforation obstruction or significant inflammatory process.  May have some early colitis but not describing any infectious symptoms white count normal no fever.  Will trial p.o.  she is otherwise quite well-appearing clinically.   [PR]  6333 Patient reassessed.  Repeat exam is benign.  She is pain is controlled she is tolerating p.o.  At this point I do not see indication for hospitalization she does have establish care with outpatient GI which I think is appropriate for close follow-up.  Have discussed with the patient and available family all diagnostics and treatments performed thus far and all questions were answered to the best of my ability. The patient demonstrates understanding and agreement with plan.    [PR]    Clinical Course User Index [PR] Merlyn Lot, MD    The patient was evaluated in Emergency Department today for the symptoms described in the history of present illness. He/she was evaluated in the context of the global COVID-19 pandemic, which necessitated consideration that the patient might be at risk for infection with the SARS-CoV-2 virus that causes COVID-19. Institutional protocols and algorithms that pertain to the evaluation of  patients at risk for COVID-19 are in a state of rapid change based on information released by regulatory bodies including the CDC and federal and state organizations. These policies and algorithms were followed during the patient's care in the ED.  As part of my medical decision making, I reviewed the following data within the [place] notes reviewed and incorporated, Labs reviewed, notes from prior ED visits and [place] Controlled Substance Database   ____________________________________________   FINAL CLINICAL IMPRESSION(S) / ED DIAGNOSES  Final diagnoses:  RUQ pain      NEW MEDICATIONS STARTED DURING THIS VISIT:  New Prescriptions   ONDANSETRON (ZOFRAN) 4 MG TABLET    Take 1 tablet (4 mg total) by mouth daily as needed.   TRAMADOL (ULTRAM) 50 MG TABLET    Take 1 tablet (50 mg total) by mouth every 6 (six) hours as needed.     Note:  This document was prepared using Dragon voice recognition software and may include unintentional dictation errors.    Merlyn Lot, MD 11/21/19 [phone]

## 2019-11-21 NOTE — Discharge Instructions (Addendum)

## 2019-11-21 NOTE — ED Notes (Signed)
Patient given gingerale and graham crackers

## 2019-11-21 NOTE — Telephone Encounter (Signed)
Pt having constant dull pain upper rt abdomen for 5 days. Pt said is worse at times and then the pain is sharp. Pt could not sleep last night due to pain. Pt cannot eat this morning. Presently pain level is 7. Pt having chills and thermometer is not working. Pt is weak and fatigued. No other covid symptoms Pt said had same episode 6 mths ago and GI changed pt to pantoprazole but this is not helping now. Pt still has gallbladder. Pt is going to [facility] ED now; declined 911. FYI to Dr [name].

## 2019-11-22 ENCOUNTER — Other Ambulatory Visit: Payer: Self-pay | Admitting: Family Medicine

## 2019-11-22 DIAGNOSIS — Z1231 Encounter for screening mammogram for malignant neoplasm of breast: Secondary | ICD-10-CM

## 2019-11-22 NOTE — Telephone Encounter (Signed)
Noted. Thank you. Will see on Friday. Glad she has GI f/u scheduled.

## 2019-11-22 NOTE — Telephone Encounter (Signed)
Pt called this morning; pt took tramadol last night and it helped pt to get some sleep but pain is still there with no real change; pain level 6.pt said she was advised by ED to schedule appt with GI. Pt has evisit with GI scheduled on 11/24/19 at 10:30 and pt already has annual exam on 11/24/19 at 7:30 with Dr [name]. Pt said no fever today and she thinks ED doctor thought colitis or Chron's might be cause for pain. Pt wanted to give Dr [name] update and will cb if needed. UC & ED precautions given and pt voiced understanding.FYI to Dr [name].

## 2019-11-24 ENCOUNTER — Ambulatory Visit (INDEPENDENT_AMBULATORY_CARE_PROVIDER_SITE_OTHER): Payer: Medicare HMO | Admitting: Family Medicine

## 2019-11-24 ENCOUNTER — Other Ambulatory Visit: Payer: Self-pay

## 2019-11-24 ENCOUNTER — Encounter: Payer: Self-pay | Admitting: Family Medicine

## 2019-11-24 ENCOUNTER — Other Ambulatory Visit (HOSPITAL_COMMUNITY): Payer: Self-pay | Admitting: Gastroenterology

## 2019-11-24 ENCOUNTER — Other Ambulatory Visit: Payer: Self-pay | Admitting: Gastroenterology

## 2019-11-24 VITALS — BP 120/68 | HR 76 | Temp 97.5°F | Ht 60.0 in | Wt 117.0 lb

## 2019-11-24 DIAGNOSIS — Z7189 Other specified counseling: Secondary | ICD-10-CM | POA: Insufficient documentation

## 2019-11-24 DIAGNOSIS — R945 Abnormal results of liver function studies: Secondary | ICD-10-CM | POA: Diagnosis not present

## 2019-11-24 DIAGNOSIS — Z9049 Acquired absence of other specified parts of digestive tract: Secondary | ICD-10-CM | POA: Diagnosis not present

## 2019-11-24 DIAGNOSIS — K5010 Crohn's disease of large intestine without complications: Secondary | ICD-10-CM

## 2019-11-24 DIAGNOSIS — D508 Other iron deficiency anemias: Secondary | ICD-10-CM

## 2019-11-24 DIAGNOSIS — E538 Deficiency of other specified B group vitamins: Secondary | ICD-10-CM | POA: Diagnosis not present

## 2019-11-24 DIAGNOSIS — R1011 Right upper quadrant pain: Secondary | ICD-10-CM

## 2019-11-24 DIAGNOSIS — Z23 Encounter for immunization: Secondary | ICD-10-CM

## 2019-11-24 DIAGNOSIS — K5000 Crohn's disease of small intestine without complications: Secondary | ICD-10-CM | POA: Diagnosis not present

## 2019-11-24 DIAGNOSIS — M8580 Other specified disorders of bone density and structure, unspecified site: Secondary | ICD-10-CM

## 2019-11-24 DIAGNOSIS — E8941 Symptomatic postprocedural ovarian failure: Secondary | ICD-10-CM | POA: Diagnosis not present

## 2019-11-24 DIAGNOSIS — Z0000 Encounter for general adult medical examination without abnormal findings: Secondary | ICD-10-CM | POA: Diagnosis not present

## 2019-11-24 DIAGNOSIS — K219 Gastro-esophageal reflux disease without esophagitis: Secondary | ICD-10-CM | POA: Diagnosis not present

## 2019-11-24 DIAGNOSIS — I10 Essential (primary) hypertension: Secondary | ICD-10-CM | POA: Diagnosis not present

## 2019-11-24 MED ORDER — AMLODIPINE BESYLATE 5 MG PO TABS: 5.0000 mg | ORAL_TABLET | Freq: Every day | ORAL | 3 refills | Status: DC

## 2019-11-24 NOTE — Assessment & Plan Note (Signed)
Continue monthly shots.

## 2019-11-24 NOTE — Assessment & Plan Note (Signed)
Preventative protocols reviewed and updated unless pt declined. Discussed healthy diet and lifestyle.  

## 2019-11-24 NOTE — Assessment & Plan Note (Signed)
Chronic, stable. Continue current regimen. 

## 2019-11-24 NOTE — Assessment & Plan Note (Signed)
Appreciate GI care. Only on mercaptopurine.

## 2019-11-24 NOTE — Assessment & Plan Note (Signed)
Ongoing over the last 5 days, ER note and workup reviewed. She has GI f/u later this morning. ?gallbladder dysfunction (mildly distended on imaging) - consider HIDA scan. She will update me after GI input.

## 2019-11-24 NOTE — Progress Notes (Signed)
This visit was conducted in person.  BP 120/68 (BP Location: Left Arm, Patient Position: Sitting, Cuff Size: Normal)   Pulse 76   Temp (!) 97.5 F (36.4 C) (Temporal)   Ht 5' (1.524 m)   Wt 117 lb (53.1 kg)   SpO2 99%   BMI 22.85 kg/m    CC: CPE, abd pain Subjective:    Patient ID: Michelle Vasquez, female    DOB: [birth year], 69 y.o.   MRN: [id number]  HPI: Michelle Vasquez is a 69 y.o. female presenting on 11/24/2019 for Annual Exam (Prt 2.)   See recent ER visit and phone note. Seen for mod-severe RUQ abd pain radiating to the back described as continuous sharp stabbing pain. Workup overall ok except for mild transaminitis. Has GI f/u scheduled for later today. ?colitis (collapsed appearance vs mild colitis on CT) vs crohn's as cause of flare. CT and RUQ [place] was reassuring. Treated with pain med and zofran. Pain persists but has improved. Appetite remains low - eating exacerbates pain. This feels different from her crohn's. She is on mercaptopurine for crohn's. She sees Dr Bonnye Fava.   Saw health advisor last week for medicare wellness visit. Note reviewed.   No exam data present    Clinical Support from 11/20/2019 in [place] at [facility] Total Score  2      Fall Risk  11/20/2019 11/11/2018  Falls in the past year? 0 0  Number falls in past yr: 0 -  Injury with Fall? 0 -  Risk for fall due to : Medication side effect -  Follow up Falls prevention discussed;Falls evaluation completed -      Preventative: COLONOSCOPY Date: 02/2015 benign biopsies, crohn's with stenotic stricture at anastomosis Olevia Perches). Has established with Dr Charise Carwin. rpt due 2021. Well woman - s/p total hysterectomy for fibroids, ovaries removed at age 26yo. Took hormone replacementonly for a few months, stopped early but unclear reason.  DEXA spine -1.7, hip -1.6 02/2018 Mammogram Birads1 02/2018 - does breast exams at home. rpt pending.  Lung cancer screening - not eligible Flu shot  yearly at work. Tdap 2015 Pneumovax 2014.Prevnar2019. Pneumovax today.  zostavax - 2017 shingrix - discussed - will check with pharmacy.  Completed Hep B series.  PPD at work yearly. Advanced directive - does not have. Wants HCPOA to be daughter. Packet provided today.  Seat belt use discussed.  Sunscreen use discussed. No suspicious moles.  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  Diet: good water, fruits/vegetablesok     Relevant past medical, surgical, family and social history reviewed and updated as indicated. Interim medical history since our last visit reviewed. Allergies and medications reviewed and updated. Outpatient Medications Prior to Visit  Medication Sig Dispense Refill  . cetirizine (ZYRTEC) 10 MG tablet TAKE ONE TABLET DAILY AS DIRECTED 100 tablet 3  . Cholecalciferol (VITAMIN D) 50 MCG (2000 UT) CAPS Take 1 capsule (2,000 Units total) by mouth daily. 90 capsule 3  . cyanocobalamin (,VITAMIN B-12,) 1000 MCG/ML injection INJECT 1 ML EVERY 2 WEEKS AS DIRECTED 234 mL 2  . FEROSUL 325 (65 Fe) MG tablet TAKE 1 TABLET BY MOUTH EVERY OTHER DAY 90 tablet 0  . LINZESS 72 MCG capsule TAKE 1 CAPSULE BY MOUTH ONCE DAILY  BEFORE BREAKFAST 90 capsule 2  . mercaptopurine (PURINETHOL) 50 MG tablet TAKE 1 TABLET BY MOUTH DAILY 1 HOUR BEFORE OR 2 HOURS AFTER MEALS ON AN EMPTY STOMACH. 90 tablet 3  . olopatadine (PATANOL) 0.1 % ophthalmic solution 1 drop 2 (two) times daily.    . ondansetron (ZOFRAN) 4 MG tablet Take 1 tablet (4 mg total) by mouth daily as needed. 14 tablet 0  . pantoprazole (PROTONIX) 40 MG tablet Take 1 tablet by mouth daily.    . Syringe, Disposable, (BD TUBERCULIN SYRINGE) 1 ML MISC 6 syringes for 1 month for B12 injections twice a month 6 each 6  . traMADol  (ULTRAM) 50 MG tablet Take 1 tablet (50 mg total) by mouth every 6 (six) hours as needed. 10 tablet 0  . amLODipine (NORVASC) 5 MG tablet TAKE 1 TABLET EVERY DAY 90 tablet 0  . cephALEXin (KEFLEX) 500 MG capsule Take 1 capsule (500 mg total) by mouth 2 (two) times daily. (Patient not taking: Reported on 11/20/2019) 14 capsule 0   Facility-Administered Medications Prior to Visit  Medication Dose Route Frequency Provider Last Rate Last Admin  . cyanocobalamin ((VITAMIN B-12)) injection 1,000 mcg  1,000 mcg Intramuscular Q30 days [name], MD   1,000 mcg at 01/19/17 1524     Per HPI unless specifically indicated in ROS section below Review of Systems  Constitutional: Negative for activity change, appetite change, chills, fatigue, fever and unexpected weight change.  HENT: Negative for hearing loss.   Eyes: Negative for visual disturbance.  Respiratory: Negative for cough, chest tightness, shortness of breath and wheezing.   Cardiovascular: Negative for chest pain, palpitations and leg swelling.  Gastrointestinal: Positive for abdominal pain and diarrhea. Negative for abdominal distention, blood in stool, constipation, nausea and vomiting.  Genitourinary: Negative for difficulty urinating and hematuria.  Musculoskeletal: Negative for arthralgias, myalgias and neck pain.  Skin: Negative for rash.  Neurological: Negative for dizziness, seizures, syncope and headaches.  Hematological: Negative for adenopathy. Does not bruise/bleed easily.  Psychiatric/Behavioral: Negative for dysphoric mood. The patient is not nervous/anxious.    Objective:    BP 120/68 (BP Location: Left Arm, Patient Position: Sitting, Cuff Size: Normal)   Pulse 76   Temp (!) 97.5 F (36.4 C) (Temporal)   Ht 5' (1.524 m)   Wt 117 lb (53.1 kg)   SpO2 99%   BMI 22.85 kg/m   Wt Readings from Last 3 Encounters:  11/24/19 117 lb (53.1 kg)  11/21/19 113 lb (51.3 kg)  10/02/19 113 lb (51.3 kg)    Physical  Exam Vitals and nursing note reviewed.  Constitutional:      General: She is not in acute distress.    Appearance: Normal appearance. She is well-developed. She is not ill-appearing.  HENT:     Head: Normocephalic and atraumatic.     Right Ear: Hearing, tympanic membrane, ear canal and external ear normal.     Left Ear: Hearing, tympanic membrane, ear canal and external ear normal.     Mouth/Throat:     Pharynx: Uvula midline.  Eyes:     General: No scleral icterus.    Extraocular Movements: Extraocular movements intact.     Conjunctiva/sclera: Conjunctivae normal.     Pupils: Pupils are equal, round, and reactive to light.  Neck:     Vascular: No carotid bruit.  Cardiovascular:     Rate and Rhythm: Normal rate and regular rhythm.     Pulses: Normal pulses.  Radial pulses are 2+ on the right side and 2+ on the left side.     Heart sounds: Normal heart sounds. No murmur.  Pulmonary:     Effort: Pulmonary effort is normal. No respiratory distress.     Breath sounds: Normal breath sounds. No wheezing, rhonchi or rales.  Abdominal:     General: Abdomen is flat. Bowel sounds are normal. There is no distension.     Palpations: Abdomen is soft. There is no mass.     Tenderness: There is abdominal tenderness (mod) in the right upper quadrant, epigastric area, suprapubic area and left lower quadrant. There is no right CVA tenderness, left CVA tenderness, guarding or rebound.     Hernia: No hernia is present.  Musculoskeletal:        General: Normal range of motion.     Cervical back: Normal range of motion and neck supple.     Right lower leg: No edema.     Left lower leg: No edema.  Lymphadenopathy:     Cervical: No cervical adenopathy.  Skin:    General: Skin is warm and dry.     Findings: No rash.  Neurological:     General: No focal deficit present.     Mental Status: She is alert and oriented to person, place, and time.     Comments: CN grossly intact, station and gait  intact  Psychiatric:        Mood and Affect: Mood normal.        Behavior: Behavior normal.        Thought Content: Thought content normal.        Judgment: Judgment normal.       Results for orders placed or performed during the hospital encounter of 11/21/19  Lipase, blood  Result Value Ref Range   Lipase 51 11 - 51 U/L  Comprehensive metabolic panel  Result Value Ref Range   Sodium 140 135 - 145 mmol/L   Potassium 4.0 3.5 - 5.1 mmol/L   Chloride 107 98 - 111 mmol/L   CO2 25 22 - 32 mmol/L   Glucose, Bld 87 70 - 99 mg/dL   BUN 14 8 - 23 mg/dL   Creatinine, Ser 0.74 0.44 - 1.00 mg/dL   Calcium 9.6 8.9 - 10.3 mg/dL   Total Protein 7.7 6.5 - 8.1 g/dL   Albumin 4.2 3.5 - 5.0 g/dL   AST 48 (H) 15 - 41 U/L   ALT 57 (H) 0 - 44 U/L   Alkaline Phosphatase 76 38 - 126 U/L   Total Bilirubin 0.9 0.3 - 1.2 mg/dL   GFR calc non Af Amer >60 >60 mL/min   GFR calc Af Amer >60 >60 mL/min   Anion gap 8 5 - 15  CBC  Result Value Ref Range   WBC 5.5 4.0 - 10.5 K/uL   RBC 3.87 3.87 - 5.11 MIL/uL   Hemoglobin 12.3 12.0 - 15.0 g/dL   HCT 37.0 36.0 - 46.0 %   MCV 95.6 80.0 - 100.0 fL   MCH 31.8 26.0 - 34.0 pg   MCHC 33.2 30.0 - 36.0 g/dL   RDW 15.3 11.5 - 15.5 %   Platelets 306 150 - 400 K/uL   nRBC 0.0 0.0 - 0.2 %  Urinalysis, Complete w Microscopic  Result Value Ref Range   Color, Urine YELLOW (A) YELLOW   APPearance CLEAR (A) CLEAR   Specific Gravity, Urine 1.013 1.005 - 1.030   pH 5.0 5.0 -  8.0   Glucose, UA NEGATIVE NEGATIVE mg/dL   Hgb urine dipstick SMALL (A) NEGATIVE   Bilirubin Urine NEGATIVE NEGATIVE   Ketones, ur NEGATIVE NEGATIVE mg/dL   Protein, ur NEGATIVE NEGATIVE mg/dL   Nitrite NEGATIVE NEGATIVE   Leukocytes,Ua NEGATIVE NEGATIVE   RBC / HPF 0-5 0 - 5 RBC/hpf   WBC, UA 0-5 0 - 5 WBC/hpf   Bacteria, UA NONE SEEN NONE SEEN   Squamous Epithelial / LPF 0-5 0 - 5   Mucus PRESENT    Depression screen [facility] 2/9 11/20/2019 11/11/2018 11/09/2017  Decreased Interest 1 0 0   Down, Depressed, Hopeless 1 0 0  PHQ - 2 Score 2 0 0  Altered sleeping 0 0 -  Tired, decreased energy 0 0 -  Change in appetite 0 0 -  Feeling bad or failure about yourself  0 0 -  Trouble concentrating 0 0 -  Moving slowly or fidgety/restless 0 0 -  Suicidal thoughts 0 0 -  PHQ-9 Score 2 0 -  Difficult doing work/chores Not difficult at all Not difficult at all -  Some recent data might be hidden   Assessment & Plan:  This visit occurred during the SARS-CoV-2 public health emergency.  Safety protocols were in place, including screening questions prior to the visit, additional usage of staff PPE, and extensive cleaning of exam room while observing appropriate contact time as indicated for disinfecting solutions.   Problem List Items Addressed This Visit    Vitamin B12 deficiency    Continue monthly shots.       S/P partial colectomy   RUQ abdominal pain    Ongoing over the last 5 days, ER note and workup reviewed. She has GI f/u later this morning. ?gallbladder dysfunction (mildly distended on imaging) - consider HIDA scan. She will update me after GI input.       Osteopenia    Continue vit D replacement.       MENOPAUSE, SURGICAL   Iron deficiency anemia    Continue QOD oral replacement.       Healthcare maintenance - Primary    Preventative protocols reviewed and updated unless pt declined. Discussed healthy diet and lifestyle.       Essential hypertension    Chronic, stable. Continue current regimen.       Relevant Medications   amLODipine (NORVASC) 5 MG tablet   Crohn's disease ([place])    Appreciate GI care. Only on mercaptopurine.       Advanced directives, counseling/discussion    Advanced directive - does not have. Wants HCPOA to be daughter. Packet provided today.        Other Visit Diagnoses    Need for 23-polyvalent pneumococcal polysaccharide vaccine       Relevant Orders   Pneumococcal polysaccharide vaccine 23-valent greater than or equal to 2yo  subcutaneous/IM (Completed)       Meds ordered this encounter  Medications  . amLODipine (NORVASC) 5 MG tablet    Sig: Take 1 tablet (5 mg total) by mouth daily.    Dispense:  90 tablet    Refill:  3   Orders Placed This Encounter  Procedures  . Pneumococcal polysaccharide vaccine 23-valent greater than or equal to 2yo subcutaneous/IM    Patient instructions: Pneumovax today.  If interested, check with pharmacy about new 2 shot shingles series (shingrix).  Let me know what GI thinks.  Good to see you today, call us with questions  Return as needed or in 1  yea for next physical.   Follow up plan: Return in about 1 year (around 11/23/2020) for medicare wellness visit, annual exam, prior fasting for blood work.  [name], MD

## 2019-11-24 NOTE — Assessment & Plan Note (Signed)
Continue QOD oral replacement.

## 2019-11-24 NOTE — Assessment & Plan Note (Signed)
Advanced directive - does not have. Wants HCPOA to be daughter. Packet provided today.

## 2019-11-24 NOTE — Assessment & Plan Note (Signed)
Continue vit D replacement.

## 2019-11-24 NOTE — Patient Instructions (Addendum)
Pneumovax today.  If interested, check with pharmacy about new 2 shot shingles series (shingrix).  Let me know what GI thinks.  Good to see you today, call us with questions  Return as needed or in 1 yea for next physical.   Health Maintenance After Age 70 After age 14, you are at a higher risk for certain long-term diseases and infections as well as injuries from falls. Falls are a major cause of broken bones and head injuries in people who are older than age 48. Getting regular preventive care can help to keep you healthy and well. Preventive care includes getting regular testing and making lifestyle changes as recommended by your health care provider. Talk with your health care provider about:  Which screenings and tests you should have. A screening is a test that checks for a disease when you have no symptoms.  A diet and exercise plan that is right for you. What should I know about screenings and tests to prevent falls? Screening and testing are the best ways to find a health problem early. Early diagnosis and treatment give you the best chance of managing medical conditions that are common after age 30. Certain conditions and lifestyle choices may make you more likely to have a fall. Your health care provider may recommend:  Regular vision checks. Poor vision and conditions such as cataracts can make you more likely to have a fall. If you wear glasses, make sure to get your prescription updated if your vision changes.  Medicine review. Work with your health care provider to regularly review all of the medicines you are taking, including over-the-counter medicines. Ask your health care provider about any side effects that may make you more likely to have a fall. Tell your health care provider if any medicines that you take make you feel dizzy or sleepy.  Osteoporosis screening. Osteoporosis is a condition that causes the bones to get weaker. This can make the bones weak and cause them to  break more easily.  Blood pressure screening. Blood pressure changes and medicines to control blood pressure can make you feel dizzy.  Strength and balance checks. Your health care provider may recommend certain tests to check your strength and balance while standing, walking, or changing positions.  Foot health exam. Foot pain and numbness, as well as not wearing proper footwear, can make you more likely to have a fall.  Depression screening. You may be more likely to have a fall if you have a fear of falling, feel emotionally low, or feel unable to do activities that you used to do.  Alcohol use screening. Using too much alcohol can affect your balance and may make you more likely to have a fall. What actions can I take to lower my risk of falls? General instructions  Talk with your health care provider about your risks for falling. Tell your health care provider if: ? You fall. Be sure to tell your health care provider about all falls, even ones that seem minor. ? You feel dizzy, sleepy, or off-balance.  Take over-the-counter and prescription medicines only as told by your health care provider. These include any supplements.  Eat a healthy diet and maintain a healthy weight. A healthy diet includes low-fat dairy products, low-fat (lean) meats, and fiber from whole grains, beans, and lots of fruits and vegetables. Home safety  Remove any tripping hazards, such as rugs, cords, and clutter.  Install safety equipment such as grab bars in bathrooms and safety rails on  stairs.  Keep rooms and walkways well-lit. Activity   Follow a regular exercise program to stay fit. This will help you maintain your balance. Ask your health care provider what types of exercise are appropriate for you.  If you need a cane or walker, use it as recommended by your health care provider.  Wear supportive shoes that have nonskid soles. Lifestyle  Do not drink alcohol if your health care provider tells  you not to drink.  If you drink alcohol, limit how much you have: ? 0-1 drink a day for women. ? 0-2 drinks a day for men.  Be aware of how much alcohol is in your drink. In the [place] one drink equals one typical bottle of beer (12 oz), one-half glass of wine (5 oz), or one shot of hard liquor (1 oz).  Do not use any products that contain nicotine or tobacco, such as cigarettes and e-cigarettes. If you need help quitting, ask your health care provider. Summary  Having a healthy lifestyle and getting preventive care can help to protect your health and wellness after age 53.  Screening and testing are the best way to find a health problem early and help you avoid having a fall. Early diagnosis and treatment give you the best chance for managing medical conditions that are more common for people who are older than age 61.  Falls are a major cause of broken bones and head injuries in people who are older than age 53. Take precautions to prevent a fall at home.  Work with your health care provider to learn what changes you can make to improve your health and wellness and to prevent falls. This information is not intended to replace advice given to you by your health care provider. Make sure you discuss any questions you have with your health care provider. Document Revised: 02/02/2019 Document Reviewed: 08/25/2017 Elsevier Patient Education  2020 [employer].

## 2019-11-29 ENCOUNTER — Encounter (HOSPITAL_COMMUNITY)
Admission: RE | Admit: 2019-11-29 | Discharge: 2019-11-29 | Disposition: A | Discharge status: Home / Self Care | Payer: Medicare HMO | Source: Ambulatory Visit | Attending: Gastroenterology | Admitting: Gastroenterology

## 2019-11-29 ENCOUNTER — Other Ambulatory Visit: Payer: Self-pay

## 2019-11-29 DIAGNOSIS — R1011 Right upper quadrant pain: Secondary | ICD-10-CM | POA: Diagnosis not present

## 2019-11-29 MED ORDER — TECHNETIUM TC 99M MEBROFENIN IV KIT
5.2000 | PACK | Freq: Once | INTRAVENOUS | Status: AC | PRN
  Administered 2019-11-29: 5.2 via INTRAVENOUS

## 2019-12-13 DIAGNOSIS — K5000 Crohn's disease of small intestine without complications: Secondary | ICD-10-CM | POA: Diagnosis not present

## 2019-12-13 DIAGNOSIS — K219 Gastro-esophageal reflux disease without esophagitis: Secondary | ICD-10-CM | POA: Diagnosis not present

## 2019-12-13 DIAGNOSIS — Z9049 Acquired absence of other specified parts of digestive tract: Secondary | ICD-10-CM | POA: Diagnosis not present

## 2019-12-13 DIAGNOSIS — R1011 Right upper quadrant pain: Secondary | ICD-10-CM | POA: Diagnosis not present

## 2019-12-13 DIAGNOSIS — R945 Abnormal results of liver function studies: Secondary | ICD-10-CM | POA: Diagnosis not present

## 2019-12-13 DIAGNOSIS — R948 Abnormal results of function studies of other organs and systems: Secondary | ICD-10-CM | POA: Diagnosis not present

## 2019-12-20 ENCOUNTER — Ambulatory Visit
Admission: RE | Admit: 2019-12-20 | Discharge: 2019-12-20 | Disposition: A | Discharge status: Home / Self Care | Payer: Medicare HMO | Source: Ambulatory Visit | Attending: Family Medicine | Admitting: Family Medicine

## 2019-12-20 DIAGNOSIS — Z1231 Encounter for screening mammogram for malignant neoplasm of breast: Secondary | ICD-10-CM | POA: Diagnosis not present

## 2019-12-20 LAB — HM MAMMOGRAPHY

## 2019-12-21 ENCOUNTER — Encounter: Payer: Self-pay | Admitting: Family Medicine

## 2019-12-25 HISTORY — PX: COLONOSCOPY: SHX174

## 2020-01-01 DIAGNOSIS — K828 Other specified diseases of gallbladder: Secondary | ICD-10-CM | POA: Diagnosis not present

## 2020-01-02 ENCOUNTER — Other Ambulatory Visit: Payer: Self-pay | Admitting: Gastroenterology

## 2020-01-10 DIAGNOSIS — Z1159 Encounter for screening for other viral diseases: Secondary | ICD-10-CM | POA: Diagnosis not present

## 2020-01-15 DIAGNOSIS — K644 Residual hemorrhoidal skin tags: Secondary | ICD-10-CM | POA: Diagnosis not present

## 2020-01-15 DIAGNOSIS — K5289 Other specified noninfective gastroenteritis and colitis: Secondary | ICD-10-CM | POA: Diagnosis not present

## 2020-01-15 DIAGNOSIS — D175 Benign lipomatous neoplasm of intra-abdominal organs: Secondary | ICD-10-CM | POA: Diagnosis not present

## 2020-01-15 DIAGNOSIS — K648 Other hemorrhoids: Secondary | ICD-10-CM | POA: Diagnosis not present

## 2020-01-15 DIAGNOSIS — K9189 Other postprocedural complications and disorders of digestive system: Secondary | ICD-10-CM | POA: Diagnosis not present

## 2020-01-15 DIAGNOSIS — K633 Ulcer of intestine: Secondary | ICD-10-CM | POA: Diagnosis not present

## 2020-01-15 DIAGNOSIS — D124 Benign neoplasm of descending colon: Secondary | ICD-10-CM | POA: Diagnosis not present

## 2020-01-15 DIAGNOSIS — D125 Benign neoplasm of sigmoid colon: Secondary | ICD-10-CM | POA: Diagnosis not present

## 2020-01-15 DIAGNOSIS — K5000 Crohn's disease of small intestine without complications: Secondary | ICD-10-CM | POA: Diagnosis not present

## 2020-01-15 DIAGNOSIS — K5730 Diverticulosis of large intestine without perforation or abscess without bleeding: Secondary | ICD-10-CM | POA: Diagnosis not present

## 2020-01-15 LAB — HM COLONOSCOPY

## 2020-01-17 DIAGNOSIS — D125 Benign neoplasm of sigmoid colon: Secondary | ICD-10-CM | POA: Diagnosis not present

## 2020-01-17 DIAGNOSIS — H52223 Regular astigmatism, bilateral: Secondary | ICD-10-CM | POA: Diagnosis not present

## 2020-01-17 DIAGNOSIS — D124 Benign neoplasm of descending colon: Secondary | ICD-10-CM | POA: Diagnosis not present

## 2020-01-17 DIAGNOSIS — K648 Other hemorrhoids: Secondary | ICD-10-CM | POA: Diagnosis not present

## 2020-01-17 DIAGNOSIS — K5730 Diverticulosis of large intestine without perforation or abscess without bleeding: Secondary | ICD-10-CM | POA: Diagnosis not present

## 2020-01-17 DIAGNOSIS — H5203 Hypermetropia, bilateral: Secondary | ICD-10-CM | POA: Diagnosis not present

## 2020-01-17 DIAGNOSIS — H2513 Age-related nuclear cataract, bilateral: Secondary | ICD-10-CM | POA: Diagnosis not present

## 2020-01-17 DIAGNOSIS — D3132 Benign neoplasm of left choroid: Secondary | ICD-10-CM | POA: Diagnosis not present

## 2020-01-17 DIAGNOSIS — H524 Presbyopia: Secondary | ICD-10-CM | POA: Diagnosis not present

## 2020-01-17 DIAGNOSIS — K644 Residual hemorrhoidal skin tags: Secondary | ICD-10-CM | POA: Diagnosis not present

## 2020-01-17 DIAGNOSIS — Z0100 Encounter for examination of eyes and vision without abnormal findings: Secondary | ICD-10-CM | POA: Diagnosis not present

## 2020-01-17 DIAGNOSIS — H02403 Unspecified ptosis of bilateral eyelids: Secondary | ICD-10-CM | POA: Diagnosis not present

## 2020-01-17 DIAGNOSIS — K9189 Other postprocedural complications and disorders of digestive system: Secondary | ICD-10-CM | POA: Diagnosis not present

## 2020-01-17 DIAGNOSIS — K5000 Crohn's disease of small intestine without complications: Secondary | ICD-10-CM | POA: Diagnosis not present

## 2020-01-17 DIAGNOSIS — K5289 Other specified noninfective gastroenteritis and colitis: Secondary | ICD-10-CM | POA: Diagnosis not present

## 2020-01-17 DIAGNOSIS — D175 Benign lipomatous neoplasm of intra-abdominal organs: Secondary | ICD-10-CM | POA: Diagnosis not present

## 2020-01-19 ENCOUNTER — Encounter: Payer: Self-pay | Admitting: Family Medicine

## 2020-01-22 ENCOUNTER — Encounter: Payer: Self-pay | Admitting: Family Medicine

## 2020-04-01 DIAGNOSIS — R948 Abnormal results of function studies of other organs and systems: Secondary | ICD-10-CM | POA: Diagnosis not present

## 2020-04-01 DIAGNOSIS — K5000 Crohn's disease of small intestine without complications: Secondary | ICD-10-CM | POA: Diagnosis not present

## 2020-04-01 DIAGNOSIS — Z9049 Acquired absence of other specified parts of digestive tract: Secondary | ICD-10-CM | POA: Diagnosis not present

## 2020-04-01 DIAGNOSIS — K219 Gastro-esophageal reflux disease without esophagitis: Secondary | ICD-10-CM | POA: Diagnosis not present

## 2020-05-18 DIAGNOSIS — Z20822 Contact with and (suspected) exposure to covid-19: Secondary | ICD-10-CM | POA: Diagnosis not present

## 2020-05-20 ENCOUNTER — Other Ambulatory Visit: Payer: Self-pay

## 2020-05-20 ENCOUNTER — Encounter: Payer: Self-pay | Admitting: Family Medicine

## 2020-05-20 ENCOUNTER — Telehealth: Payer: Self-pay | Admitting: Family Medicine

## 2020-05-20 ENCOUNTER — Telehealth (INDEPENDENT_AMBULATORY_CARE_PROVIDER_SITE_OTHER): Payer: Medicare HMO | Admitting: Family Medicine

## 2020-05-20 VITALS — Temp 99.9°F | Wt 119.0 lb

## 2020-05-20 DIAGNOSIS — R52 Pain, unspecified: Secondary | ICD-10-CM | POA: Diagnosis not present

## 2020-05-20 DIAGNOSIS — R197 Diarrhea, unspecified: Secondary | ICD-10-CM | POA: Diagnosis not present

## 2020-05-20 DIAGNOSIS — R509 Fever, unspecified: Secondary | ICD-10-CM

## 2020-05-20 DIAGNOSIS — M542 Cervicalgia: Secondary | ICD-10-CM | POA: Diagnosis not present

## 2020-05-20 NOTE — Assessment & Plan Note (Signed)
Ongoing for 10+ days, some improvement again noted however persisting. Discussed concern over process like meninigitis - she does not feel neck stiffness. Advised if recurrent or worsening will need [facility] evaluation. Will watch for now given some improvement over last 24 hours.

## 2020-05-20 NOTE — Progress Notes (Signed)
Virtual visit completed through MyChart, a video enabled telemedicine application. Due to national recommendations of social distancing due to COVID-19, a virtual visit is felt to be most appropriate for this patient at this time. Reviewed limitations, risks, security and privacy concerns of performing a virtual visit and the availability of in person appointments. I also reviewed that there may be a patient responsible charge related to this service. The patient agreed to proceed.  Interactive audio and video telecommunications were attempted between myself and Michelle Vasquez, however failed due to patient having technical difficulties OR patient not having access to video capability.  We continued and completed visit with audio only.  Time: 1:58pm - 2:12pm   Patient location: home Provider location: [place] at [facility], office Persons participating in this virtual visit: patient, provider   If any vitals were documented, they were collected by patient at home unless specified below.    Temp 99.9 F (37.7 C) (Oral)   Wt 119 lb (54 kg)   BMI 23.24 kg/m    CC: fever, diarrhea Subjective:    Patient ID: Michelle Vasquez, female    DOB: [birth year], 69 y.o.   MRN: [id number]  HPI: Michelle Vasquez is a 70 y.o. female presenting on 05/20/2020 for Other (Sx started Wednesday 05/15/20, Fever, body aches, diarrhea/abdominal pain - Neg COVID test on Saturday )   5d h/o fatigue, sluggish feel, anorexia. 4d ago was with grandchildren - long busy day. Fever, nausea over the weekend - Tmax 101.4. Yesterday started having abd cramping associated with diarrhea, bad headache with posterior neck and shoulder pain (x 1+ wk), body aches. Watery diarrhea - x5 yesterday, today x2. No neck stiffness endorsed. Too zofran last night with significant improvement in nausea. Significant sinus congestion initially.   No blood in stool, black tarry stools, no pale stools, increased buoyancy. No cough, dyspnea,  chest pain, ST or ear or tooth pain.   Prior to this Crohn's had been well controlled, managed only with mercaptopurine. This doesn't feel like crohn's flare.   Had negative COVID-19 test through [facility] (took 1d to run).    E coli outbreak in [place] in [place]. She has also been going to local pool.   Linzess currently on hold.   COLONOSCOPY 12/2019 - small adenomas, crohn's disease small intestine, diverticulosis, non-patent end to side ileocolonic anastomosis with inflammation and ulceration biopsied, rpt 3 yrs ([facility])      Relevant past medical, surgical, family and social history reviewed and updated as indicated. Interim medical history since our last visit reviewed. Allergies and medications reviewed and updated. Outpatient Medications Prior to Visit  Medication Sig Dispense Refill  . amLODipine (NORVASC) 5 MG tablet Take 1 tablet (5 mg total) by mouth daily. 90 tablet 3  . cetirizine (ZYRTEC) 10 MG tablet TAKE ONE TABLET DAILY AS DIRECTED 100 tablet 3  . Cholecalciferol (VITAMIN D) 50 MCG (2000 UT) CAPS Take 1 capsule (2,000 Units total) by mouth daily. 90 capsule 3  . cyanocobalamin (,VITAMIN B-12,) 1000 MCG/ML injection INJECT 1 ML EVERY 2 WEEKS AS DIRECTED 234 mL 2  . FEROSUL 325 (65 Fe) MG tablet TAKE 1 TABLET BY MOUTH EVERY OTHER DAY 90 tablet 0  . mercaptopurine (PURINETHOL) 50 MG tablet TAKE 1 TABLET BY MOUTH DAILY 1 HOUR BEFORE OR 2 HOURS AFTER MEALS ON AN EMPTY STOMACH. 90 tablet 3  . pantoprazole (PROTONIX) 40 MG tablet Take 1 tablet by mouth daily.    . Syringe, Disposable, (BD  TUBERCULIN SYRINGE) 1 ML MISC 6 syringes for 1 month for B12 injections twice a month 6 each 6  . traMADol (ULTRAM) 50 MG tablet Take 1 tablet (50 mg total) by mouth every 6 (six) hours as needed. 10 tablet 0  . LINZESS 72 MCG capsule TAKE 1 CAPSULE BY MOUTH ONCE DAILY BEFORE BREAKFAST (Patient not taking: Reported on 05/20/2020) 90 capsule 2  . olopatadine (PATANOL) 0.1 % ophthalmic  solution 1 drop 2 (two) times daily. (Patient not taking: Reported on 05/20/2020)    . ondansetron (ZOFRAN) 4 MG tablet Take 1 tablet (4 mg total) by mouth daily as needed. (Patient not taking: Reported on 05/20/2020) 14 tablet 0   Facility-Administered Medications Prior to Visit  Medication Dose Route Frequency Provider Last Rate Last Admin  . cyanocobalamin ((VITAMIN B-12)) injection 1,000 mcg  1,000 mcg Intramuscular Q30 days [name], MD   1,000 mcg at 01/19/17 1524     Per HPI unless specifically indicated in ROS section below Review of Systems Objective:  Temp 99.9 F (37.7 C) (Oral)   Wt 119 lb (54 kg)   BMI 23.24 kg/m   Wt Readings from Last 3 Encounters:  05/20/20 119 lb (54 kg)  11/24/19 117 lb (53.1 kg)  11/21/19 113 lb (51.3 kg)       Physical exam: Gen: alert, NAD, not ill appearing Pulm: speaks in complete sentences without increased work of breathing Psych: normal mood, normal thought content      Results for orders placed or performed in visit on 01/22/20  HM COLONOSCOPY  Result Value Ref Range   HM Colonoscopy See Report (in chart) See Report (in chart), Patient Reported   Assessment & Plan:   Problem List Items Addressed This Visit    Neck pain    Ongoing for 10+ days, some improvement again noted however persisting. Discussed concern over process like meninigitis - she does not feel neck stiffness. Advised if recurrent or worsening will need UCC evaluation. Will watch for now given some improvement over last 24 hours.       Diarrhea - Primary    1d of watery diarrhea associated with fever, improvement after zofran. Suspicious for infectious cause, however symptoms may be improving today compared to yesterday. Ok to continue zofran, discussed bland and liquid diet for next 24-48 hours with close monitoring. Red flags to seek urgent care reviewed. Low threshold to send GI pathogen panel and start empiric abx if not improving.        Other Visit  Diagnoses    Generalized body aches       Fever, unspecified fever cause           No orders of the defined types were placed in this encounter.  No orders of the defined types were placed in this encounter.   I discussed the assessment and treatment plan with the patient. The patient was provided an opportunity to ask questions and all were answered. The patient agreed with the plan and demonstrated an understanding of the instructions. The patient was advised to call back or seek an in-person evaluation if the symptoms worsen or if the condition fails to improve as anticipated.  Follow up plan: No follow-ups on file.  [name], MD

## 2020-05-20 NOTE — Telephone Encounter (Signed)
plz call tomorrow for update on symptoms - fever, diarrhea, body aches and headache/neck pain.

## 2020-05-20 NOTE — Assessment & Plan Note (Addendum)
1d of watery diarrhea associated with fever, improvement after zofran. Suspicious for infectious cause, however symptoms may be improving today compared to yesterday. Ok to continue zofran, discussed bland and liquid diet for next 24-48 hours with close monitoring. Red flags to seek urgent care reviewed. Low threshold to send GI pathogen panel and start empiric abx if not improving.

## 2020-05-22 NOTE — Telephone Encounter (Signed)
No fever. Still has diarrhea, nausea, and no energy. Still feels pretty bad. Some nasal congestion.

## 2020-05-22 NOTE — Telephone Encounter (Signed)
Please schedule for tomorrow Thursday at 12:30 or 12:45pm in office appointment.

## 2020-05-23 ENCOUNTER — Ambulatory Visit (INDEPENDENT_AMBULATORY_CARE_PROVIDER_SITE_OTHER): Payer: Medicare HMO | Admitting: Family Medicine

## 2020-05-23 ENCOUNTER — Other Ambulatory Visit: Payer: Self-pay

## 2020-05-23 ENCOUNTER — Encounter: Payer: Self-pay | Admitting: Family Medicine

## 2020-05-23 VITALS — BP 118/76 | HR 76 | Temp 97.0°F | Ht 60.0 in | Wt 113.5 lb

## 2020-05-23 DIAGNOSIS — K5010 Crohn's disease of large intestine without complications: Secondary | ICD-10-CM | POA: Diagnosis not present

## 2020-05-23 DIAGNOSIS — R011 Cardiac murmur, unspecified: Secondary | ICD-10-CM

## 2020-05-23 DIAGNOSIS — R197 Diarrhea, unspecified: Secondary | ICD-10-CM

## 2020-05-23 LAB — COMPREHENSIVE METABOLIC PANEL
ALT: 18 U/L (ref 0–35)
AST: 21 U/L (ref 0–37)
Albumin: 4 g/dL (ref 3.5–5.2)
Alkaline Phosphatase: 65 U/L (ref 39–117)
BUN: 10 mg/dL (ref 6–23)
CO2: 32 mEq/L (ref 19–32)
Calcium: 9.4 mg/dL (ref 8.4–10.5)
Chloride: 106 mEq/L (ref 96–112)
Creatinine, Ser: 0.87 mg/dL (ref 0.40–1.20)
GFR: 64.42 mL/min (ref >60.00)
Glucose, Bld: 80 mg/dL (ref 70–99)
Potassium: 4 mEq/L (ref 3.5–5.1)
Sodium: 143 mEq/L (ref 135–145)
Total Bilirubin: 0.6 mg/dL (ref 0.2–1.2)
Total Protein: 7 g/dL (ref 6.0–8.3)

## 2020-05-23 LAB — CBC WITH DIFFERENTIAL/PLATELET
Basophils Absolute: 0 10*3/uL (ref 0.0–0.1)
Basophils Relative: 0.5 % (ref 0.0–3.0)
Eosinophils Absolute: 0 10*3/uL (ref 0.0–0.7)
Eosinophils Relative: 0.8 % (ref 0.0–5.0)
HCT: 33.4 % — ABNORMAL LOW (ref 36.0–46.0)
Hemoglobin: 11.6 g/dL — ABNORMAL LOW (ref 12.0–15.0)
Lymphocytes Relative: 29.1 % (ref 12.0–46.0)
Lymphs Abs: 1.7 10*3/uL (ref 0.7–4.0)
MCHC: 34.8 g/dL (ref 30.0–36.0)
MCV: 101.5 fl — ABNORMAL HIGH (ref 78.0–100.0)
Monocytes Absolute: 0.4 10*3/uL (ref 0.1–1.0)
Monocytes Relative: 7.5 % (ref 3.0–12.0)
Neutro Abs: 3.6 10*3/uL (ref 1.4–7.7)
Neutrophils Relative %: 62.1 % (ref 43.0–77.0)
Platelets: 261 10*3/uL (ref 150.0–400.0)
RBC: 3.29 Mil/uL — ABNORMAL LOW (ref 3.87–5.11)
RDW: 15.3 % (ref 11.5–15.5)
WBC: 5.8 10*3/uL (ref 4.0–10.5)

## 2020-05-23 NOTE — Patient Instructions (Addendum)
Labs today GI stool test today looking for infection.  Continue zofran as needed  Let us know if any worsening, or fevers, or concern for dehydration.

## 2020-05-23 NOTE — Progress Notes (Signed)
This visit was conducted in person.  BP 118/76 (BP Location: Left Arm, Patient Position: Sitting, Cuff Size: Normal)   Pulse 76   Temp (!) 97 F (36.1 C) (Temporal)   Ht 5' (1.524 m)   Wt 113 lb 8 oz (51.5 kg)   SpO2 98%   BMI 22.17 kg/m    CC: diarrhea, nausea, fatigue Subjective:    Patient ID: Michelle Vasquez, female    DOB: February 04, 1951, 69 y.o.   MRN: [id number]  HPI: Michelle Vasquez is a 69 y.o. female presenting on 05/23/2020 for Diarrhea, Nausea, and Fatigue   See prior note for details.  Seen earlier this week for virtual visit for 5d h/o (now 8 days) diarrhea.  Ongoing fatigue and nausea. Diarrhea doing some better - 3-4 yesterday, none today yet. Abdominal cramping has improved. Zofran causes sedation, but helps nausea. Persistent headache but no neck stiffness. Initial fever now resolved. None this week.   [facility]  Holding linzess.  Recent Ecoli outbreak in [place].   Known Crohn's - overall well controlled - this doesn't feel like a flare.   COLONOSCOPY 12/2019 - small adenomas, crohn's disease small intestine, diverticulosis, non-patent end to side ileocolonic anastomosis with inflammation and ulceration biopsied, rpt 3 yrs ([facility]t)      Relevant past medical, surgical, family and social history reviewed and updated as indicated. Interim medical history since our last visit reviewed. Allergies and medications reviewed and updated. Outpatient Medications Prior to Visit  Medication Sig Dispense Refill  . amLODipine (NORVASC) 5 MG tablet Take 1 tablet (5 mg total) by mouth daily. 90 tablet 3  . cetirizine (ZYRTEC) 10 MG tablet TAKE ONE TABLET DAILY AS DIRECTED 100 tablet 3  . Cholecalciferol (VITAMIN D) 50 MCG (2000 UT) CAPS Take 1 capsule (2,000 Units total) by mouth daily. 90 capsule 3  . cyanocobalamin (,VITAMIN B-12,) 1000 MCG/ML injection INJECT 1 ML EVERY 2 WEEKS AS DIRECTED 234 mL 2  . FEROSUL 325 (65 Fe) MG tablet TAKE 1  TABLET BY MOUTH EVERY OTHER DAY 90 tablet 0  . LINZESS 72 MCG capsule TAKE 1 CAPSULE BY MOUTH ONCE DAILY BEFORE BREAKFAST 90 capsule 2  . mercaptopurine (PURINETHOL) 50 MG tablet TAKE 1 TABLET BY MOUTH DAILY 1 HOUR BEFORE OR 2 HOURS AFTER MEALS ON AN EMPTY STOMACH. 90 tablet 3  . pantoprazole (PROTONIX) 40 MG tablet Take 1 tablet by mouth daily.    . Syringe, Disposable, (BD TUBERCULIN SYRINGE) 1 ML MISC 6 syringes for 1 month for B12 injections twice a month 6 each 6  . olopatadine (PATANOL) 0.1 % ophthalmic solution 1 drop 2 (two) times daily. (Patient not taking: Reported on 05/20/2020)    . ondansetron (ZOFRAN) 4 MG tablet Take 1 tablet (4 mg total) by mouth daily as needed. (Patient not taking: Reported on 05/20/2020) 14 tablet 0  . traMADol (ULTRAM) 50 MG tablet Take 1 tablet (50 mg total) by mouth every 6 (six) hours as needed. 10 tablet 0   Facility-Administered Medications Prior to Visit  Medication Dose Route Frequency Provider Last Rate Last Admin  . cyanocobalamin ((VITAMIN B-12)) injection 1,000 mcg  1,000 mcg Intramuscular Q30 days [name], MD   1,000 mcg at 01/19/17 1524     Per HPI unless specifically indicated in ROS section below Review of Systems Objective:  BP 118/76 (BP Location: Left Arm, Patient Position: Sitting, Cuff Size: Normal)   Pulse 76   Temp (!) 97 F (36.1  C) (Temporal)   Ht 5' (1.524 m)   Wt 113 lb 8 oz (51.5 kg)   SpO2 98%   BMI 22.17 kg/m   Wt Readings from Last 3 Encounters:  05/23/20 113 lb 8 oz (51.5 kg)  05/20/20 119 lb (54 kg)  11/24/19 117 lb (53.1 kg)      Physical Exam Vitals and nursing note reviewed.  Constitutional:      Appearance: Normal appearance. She is not ill-appearing.  HENT:     Mouth/Throat:     Mouth: Mucous membranes are moist.     Pharynx: Oropharynx is clear. No oropharyngeal exudate or posterior oropharyngeal erythema.  Cardiovascular:     Rate and Rhythm: Normal rate and regular rhythm.     Pulses: Normal  pulses.     Heart sounds: Murmur (2/6 systolic USB) heard.   Pulmonary:     Effort: Pulmonary effort is normal. No respiratory distress.     Breath sounds: Normal breath sounds. No wheezing, rhonchi or rales.  Abdominal:     General: Abdomen is flat. Bowel sounds are normal. There is no distension.     Palpations: Abdomen is soft. There is no mass.     Tenderness: There is abdominal tenderness (mild) in the right lower quadrant, suprapubic area and left lower quadrant. There is no right CVA tenderness, left CVA tenderness, guarding or rebound. Negative signs include Murphy's sign.     Hernia: No hernia is present.  Musculoskeletal:     Right lower leg: No edema.     Left lower leg: No edema.  Skin:    General: Skin is warm and dry.     Findings: No rash.  Neurological:     Mental Status: She is alert.  Psychiatric:        Mood and Affect: Mood normal.        Behavior: Behavior normal.       Results for orders placed or performed in visit on 01/22/20  HM COLONOSCOPY  Result Value Ref Range   HM Colonoscopy See Report (in chart) See Report (in chart), Patient Reported   Assessment & Plan:  This visit occurred during the SARS-CoV-2 public health emergency.  Safety protocols were in place, including screening questions prior to the visit, additional usage of staff PPE, and extensive cleaning of exam room while observing appropriate contact time as indicated for disinfecting solutions.   Problem List Items Addressed This Visit    Systolic murmur    Newly noted today, mild - will monitor       Diarrhea - Primary    With nausea, fatigue, headache. Improvement noted however with ongoing diarrhea for >1 wk. Had several episodes of watery diarrhea yesterday. In crohn's and with recent E coli outbreak in [place] will check labs (CBC, CMP) and GI pathogen panel today. Supportive care reviewed - clear liquid diet for next several days, then slowly advance as tolerated. Red flags to seek  further care over weekend reviewed.       Relevant Orders   Comprehensive metabolic panel   CBC with Differential/Platelet   Gastrointestinal Pathogen Panel PCR   Crohn's disease ([place])       No orders of the defined types were placed in this encounter.  Orders Placed This Encounter  Procedures  . Comprehensive metabolic panel  . CBC with Differential/Platelet  . Gastrointestinal Pathogen Panel PCR   Patient Instructions  Labs today GI stool test today looking for infection.  Continue zofran as needed  Let us know if any worsening, or fevers, or concern for dehydration.    Follow up plan: Return if symptoms worsen or fail to improve.  [name], MD

## 2020-05-23 NOTE — Assessment & Plan Note (Addendum)
Newly noted today, mild - will monitor

## 2020-05-23 NOTE — Assessment & Plan Note (Signed)
With nausea, fatigue, headache. Improvement noted however with ongoing diarrhea for >1 wk. Had several episodes of watery diarrhea yesterday. In crohn's and with recent E coli outbreak in [place] will check labs (CBC, CMP) and GI pathogen panel today. Supportive care reviewed - clear liquid diet for next several days, then slowly advance as tolerated. Red flags to seek further care over weekend reviewed.

## 2020-05-23 NOTE — Addendum Note (Signed)
Addended by: Cloyd Stagers on: 05/23/2020 01:23 PM   Modules accepted: Orders

## 2020-05-23 NOTE — Telephone Encounter (Signed)
Pt coming in today at 12:30

## 2020-05-24 ENCOUNTER — Other Ambulatory Visit (INDEPENDENT_AMBULATORY_CARE_PROVIDER_SITE_OTHER): Payer: Medicare HMO

## 2020-05-24 DIAGNOSIS — R197 Diarrhea, unspecified: Secondary | ICD-10-CM | POA: Diagnosis not present

## 2020-05-24 DIAGNOSIS — K5010 Crohn's disease of large intestine without complications: Secondary | ICD-10-CM | POA: Diagnosis not present

## 2020-05-27 LAB — GASTROINTESTINAL PATHOGEN PANEL PCR
C. difficile Tox A/B, PCR: NOT DETECTED
Campylobacter, PCR: NOT DETECTED
Cryptosporidium, PCR: NOT DETECTED
E coli (ETEC) LT/ST PCR: NOT DETECTED
E coli (STEC) stx1/stx2, PCR: NOT DETECTED
E coli 0157, PCR: NOT DETECTED
Giardia lamblia, PCR: NOT DETECTED
Norovirus, PCR: NOT DETECTED
Rotavirus A, PCR: NOT DETECTED
Salmonella, PCR: NOT DETECTED
Shigella, PCR: NOT DETECTED

## 2020-05-28 ENCOUNTER — Other Ambulatory Visit: Payer: Self-pay | Admitting: Family Medicine

## 2020-05-29 ENCOUNTER — Encounter: Payer: Self-pay | Admitting: Family Medicine

## 2020-05-30 NOTE — Telephone Encounter (Signed)
Spoke with patient.  Started with HA, drainage, sinus pressure over the weekend ~5d symptoms.  + COVID exposure recently.  Given + contacts, did recommend retesting (initially tested negative 05/18/2020)

## 2020-05-31 DIAGNOSIS — Z20822 Contact with and (suspected) exposure to covid-19: Secondary | ICD-10-CM | POA: Diagnosis not present

## 2020-07-29 ENCOUNTER — Other Ambulatory Visit: Payer: Self-pay | Admitting: Family Medicine

## 2020-08-31 ENCOUNTER — Other Ambulatory Visit: Payer: Self-pay | Admitting: Family Medicine

## 2020-11-01 DIAGNOSIS — K5090 Crohn's disease, unspecified, without complications: Secondary | ICD-10-CM | POA: Diagnosis not present

## 2020-11-01 DIAGNOSIS — K828 Other specified diseases of gallbladder: Secondary | ICD-10-CM | POA: Diagnosis not present

## 2020-11-07 ENCOUNTER — Other Ambulatory Visit: Payer: Medicare HMO

## 2020-11-07 ENCOUNTER — Other Ambulatory Visit: Payer: Self-pay | Admitting: Surgery

## 2020-11-07 DIAGNOSIS — K50919 Crohn's disease, unspecified, with unspecified complications: Secondary | ICD-10-CM

## 2020-11-13 DIAGNOSIS — K5000 Crohn's disease of small intestine without complications: Secondary | ICD-10-CM | POA: Diagnosis not present

## 2020-11-13 DIAGNOSIS — K219 Gastro-esophageal reflux disease without esophagitis: Secondary | ICD-10-CM | POA: Diagnosis not present

## 2020-11-13 DIAGNOSIS — R948 Abnormal results of function studies of other organs and systems: Secondary | ICD-10-CM | POA: Diagnosis not present

## 2020-11-13 DIAGNOSIS — Z9049 Acquired absence of other specified parts of digestive tract: Secondary | ICD-10-CM | POA: Diagnosis not present

## 2020-11-20 ENCOUNTER — Other Ambulatory Visit: Payer: Self-pay | Admitting: Family Medicine

## 2020-11-20 DIAGNOSIS — D508 Other iron deficiency anemias: Secondary | ICD-10-CM

## 2020-11-20 DIAGNOSIS — I10 Essential (primary) hypertension: Secondary | ICD-10-CM

## 2020-11-20 DIAGNOSIS — E538 Deficiency of other specified B group vitamins: Secondary | ICD-10-CM

## 2020-11-20 DIAGNOSIS — K5010 Crohn's disease of large intestine without complications: Secondary | ICD-10-CM

## 2020-11-21 ENCOUNTER — Other Ambulatory Visit: Payer: Self-pay

## 2020-11-21 ENCOUNTER — Ambulatory Visit: Payer: Medicare HMO

## 2020-11-21 ENCOUNTER — Other Ambulatory Visit (INDEPENDENT_AMBULATORY_CARE_PROVIDER_SITE_OTHER): Payer: Medicare HMO

## 2020-11-21 DIAGNOSIS — D508 Other iron deficiency anemias: Secondary | ICD-10-CM | POA: Diagnosis not present

## 2020-11-21 DIAGNOSIS — E538 Deficiency of other specified B group vitamins: Secondary | ICD-10-CM

## 2020-11-21 DIAGNOSIS — I10 Essential (primary) hypertension: Secondary | ICD-10-CM

## 2020-11-21 DIAGNOSIS — K5010 Crohn's disease of large intestine without complications: Secondary | ICD-10-CM | POA: Diagnosis not present

## 2020-11-21 LAB — CBC WITH DIFFERENTIAL/PLATELET
Basophils Absolute: 0 10*3/uL (ref 0.0–0.1)
Basophils Relative: 0.5 % (ref 0.0–3.0)
Eosinophils Absolute: 0.1 10*3/uL (ref 0.0–0.7)
Eosinophils Relative: 1.4 % (ref 0.0–5.0)
HCT: 36.6 % (ref 36.0–46.0)
Hemoglobin: 12.4 g/dL (ref 12.0–15.0)
Lymphocytes Relative: 31.5 % (ref 12.0–46.0)
Lymphs Abs: 2.1 10*3/uL (ref 0.7–4.0)
MCHC: 33.7 g/dL (ref 30.0–36.0)
MCV: 100.6 fl — ABNORMAL HIGH (ref 78.0–100.0)
Monocytes Absolute: 0.4 10*3/uL (ref 0.1–1.0)
Monocytes Relative: 6.7 % (ref 3.0–12.0)
Neutro Abs: 4 10*3/uL (ref 1.4–7.7)
Neutrophils Relative %: 59.9 % (ref 43.0–77.0)
Platelets: 316 10*3/uL (ref 150.0–400.0)
RBC: 3.64 Mil/uL — ABNORMAL LOW (ref 3.87–5.11)
RDW: 14.4 % (ref 11.5–15.5)
WBC: 6.7 10*3/uL (ref 4.0–10.5)

## 2020-11-21 LAB — VITAMIN B12: Vitamin B-12: 143 pg/mL — ABNORMAL LOW (ref 211–911)

## 2020-11-21 LAB — IBC PANEL
Iron: 191 ug/dL — ABNORMAL HIGH (ref 42–145)
Saturation Ratios: 40.7 % (ref 20.0–50.0)
Transferrin: 335 mg/dL (ref 212.0–360.0)

## 2020-11-21 LAB — FERRITIN: Ferritin: 49.8 ng/mL (ref 10.0–291.0)

## 2020-11-21 LAB — MICROALBUMIN / CREATININE URINE RATIO
Creatinine,U: 18.2 mg/dL
Microalb Creat Ratio: 4.3 mg/g (ref 0.0–30.0)
Microalb, Ur: 0.8 mg/dL (ref 0.0–1.9)

## 2020-11-21 NOTE — Addendum Note (Signed)
Addended by: Ellamae Sia on: 11/21/2020 10:10 AM   Modules accepted: Orders

## 2020-11-22 ENCOUNTER — Ambulatory Visit (INDEPENDENT_AMBULATORY_CARE_PROVIDER_SITE_OTHER): Payer: Medicare HMO

## 2020-11-22 DIAGNOSIS — Z0000 Encounter for general adult medical examination without abnormal findings: Secondary | ICD-10-CM

## 2020-11-22 NOTE — Progress Notes (Signed)
PCP notes:  Health Maintenance: Flu- declined Dexa- declined    Abnormal Screenings: none   Patient concerns: Anxiety issues  Sinus issues  Nurse concerns: none   Next PCP appt.: 11/25/2020 @ 3 pm

## 2020-11-22 NOTE — Progress Notes (Signed)
Subjective:   Michelle Vasquez is a 70 y.o. female who presents for Medicare Annual (Subsequent) preventive examination.  Review of Systems: N/A      I connected with patient today by a video enabled telemedicine application and verified that I am speaking with the correct person using two identifiers.  Location patient: home  Location nurse: work  Persons participating in the virtual visit: patient, nurse     I discussed the limitations, risks, security, and privacy concerns of performing an evaluation and management service by telephone and the availability of in person appointments. The patient expressed understanding and agreed to proceed.        Cardiac Risk Factors include: advanced age (>40mn, >>73women);hypertension     Objective:    Today's Vitals   11/22/20 1029  PainSc: 4    There is no height or weight on file to calculate BMI.  Advanced Directives 11/22/2020 11/21/2019 11/20/2019 11/11/2018 03/15/2015 03/01/2015 01/16/2013  Does Patient Have a Medical Advance Directive? Yes No No No No No Patient does not have advance directive;Patient would like information  Type of Advance Directive [place] - - - - - -  [place] of [place]? No - copy requested - - - - - -  Would patient like information on creating a medical advance directive? - - No - Patient declined Yes (MAU/Ambulatory/Procedural Areas - Information given) - - Referral made to social work  Pre-existing out of facility DNR order (yellow form or pink MOST form) - - - - - - No    Current Medications (verified) Outpatient Encounter Medications as of 11/22/2020  Medication Sig  . amLODipine (NORVASC) 5 MG tablet TAKE 1 TABLET BY MOUTH ONCE A DAY  . budesonide (ENTOCORT EC) 3 MG 24 hr capsule 1 capsule  . cetirizine (ZYRTEC) 10 MG tablet TAKE ONE TABLET DAILY AS DIRECTED  . Cholecalciferol (VITAMIN D) 50 MCG (2000 UT) CAPS Take 1 capsule (2,000 Units  total) by mouth daily.  . FEROSUL 325 (65 Fe) MG tablet TAKE 1 TABLET BY MOUTH EVERY OTHER DAY  . LINZESS 72 MCG capsule TAKE 1 CAPSULE BY MOUTH ONCE DAILY BEFORE BREAKFAST  . mercaptopurine (PURINETHOL) 50 MG tablet TAKE 1 TABLET BY MOUTH DAILY 1 HOUR BEFORE OR 2 HOURS AFTER MEALS ON AN EMPTY STOMACH.  . pantoprazole (PROTONIX) 40 MG tablet Take 1 tablet by mouth daily.  . Syringe, Disposable, (BD TUBERCULIN SYRINGE) 1 ML MISC 6 syringes for 1 month for B12 injections twice a month  . cyanocobalamin (,VITAMIN B-12,) 1000 MCG/ML injection INJECT 1 ML EVERY 2 WEEKS AS DIRECTED   Facility-Administered Encounter Medications as of 11/22/2020  Medication  . cyanocobalamin ((VITAMIN B-12)) injection 1,000 mcg    Allergies (verified) Celexa [citalopram hydrobromide]   History: Past Medical History:  Diagnosis Date  . Allergy    seasonal  . Anxiety and depression 07/03/2011  . B12 deficiency   . Blood transfusion without reported diagnosis   . Central hearing loss   . Crohn disease ([place]    s/p colectomy on 6MP, ?bacterial overgrowth  . Fatty liver 12/15/11  . History of small bowel obstruction   . HTN (hypertension)   . Internal hemorrhoids without mention of complication   . Osteomalacia, unspecified   . Perennial allergic rhinitis with seasonal variation   . Unspecified deficiency anemia   . Unspecified polyarthropathy or polyarthritis, multiple sites    Past Surgical History:  Procedure Laterality Date  .  APPENDECTOMY    . COLONOSCOPY  02/25/05   Internal hemms; crohns; narrow anast  . COLONOSCOPY  02/26/10   Prior right hemicolectomy o/w benign (Dr. Olevia Perches)  . COLONOSCOPY  02/2015   benign biopsies, crohn's with stenotic stricture at anastomosis Olevia Perches)  . COLONOSCOPY  12/2019   small adenomas, crohn's disease small intestine, diverticulosis, non-patent end to side ileocolonic anastomosis with inflammation and ulceration biopsied, rpt 3 yrs (Brahmbhatt)  . DEXA  07/06/2011    normal T score -1.0 femur, spine  . ESOPHAGOGASTRODUODENOSCOPY  02/25/05   Dilated stricture  . ESOPHAGOGASTRODUODENOSCOPY  10/2017   esophageal stenosis dilated, duodenal diverticuluc (Nandigam)  . HEMICOLECTOMY  1980   all small intestine, none large  . OTHER SURGICAL HISTORY  1960s, 1978, 1980   ileal resection x 3 and subtotal colon resection  . TOTAL ABDOMINAL HYSTERECTOMY  1975   ovaries out as well  . UPPER GASTROINTESTINAL ENDOSCOPY     Family History  Problem Relation Age of Onset  . Crohn's disease Father        colostomy  . Hypertension Mother   . Heart disease Mother 58  . Alcohol abuse Brother   . Cirrhosis Brother   . Crohn's disease Sister   . Crohn's disease Paternal Aunt        x 3  . Crohn's disease Other        neice x 2  . Colon cancer Neg Hx   . Cancer Neg Hx   . Diabetes Neg Hx   . Rectal cancer Neg Hx   . Stomach cancer Neg Hx   . Esophageal cancer Neg Hx   . Breast cancer Neg Hx    Social History   Socioeconomic History  . Marital status: Divorced    Spouse name: Not on file  . Number of children: 3  . Years of education: Not on file  . Highest education level: Not on file  Occupational History  . Occupation: Medical Transcription    Employer: OTHER    Comment: [employer]  Tobacco Use  . Smoking status: Former Smoker    Packs/day: 0.25    Years: 15.00    Pack years: 3.75    Types: Cigarettes    Quit date: 10/26/1978    Years since quitting: 42.1  . Smokeless tobacco: Never Used  Vaping Use  . Vaping Use: Never used  Substance and Sexual Activity  . Alcohol use: No  . Drug use: No  . Sexual activity: Not on file  Other Topics Concern  . Not on file  Social History Narrative   Caffeine: 2 cups coffee/day   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, no regular activity   Diet: good water, fruits/vegetables occasionally   Social Determinants of Health   Financial Resource  Strain: Low Risk   . Difficulty of Paying Living Expenses: Not hard at all  Food Insecurity: No Food Insecurity  . Worried About Charity fundraiser in the Last Year: Never true  . Ran Out of Food in the Last Year: Never true  Transportation Needs: No Transportation Needs  . Lack of Transportation (Medical): No  . Lack of Transportation (Non-Medical): No  Physical Activity: Inactive  . Days of Exercise per Week: 0 days  . Minutes of Exercise per Session: 0 min  Stress: No Stress Concern Present  . Feeling of Stress : Not at all  Social Connections: Not on  file    Tobacco Counseling Counseling given: Not Answered   Clinical Intake:  Pre-visit preparation completed: Yes  Pain : 0-10 Pain Score: 4  Pain Type: Chronic pain Pain Location: Abdomen Pain Descriptors / Indicators: Aching Pain Onset: More than a month ago Pain Frequency: Intermittent     Nutritional Risks: Nausea/ vomitting/ diarrhea (diarrhea sometimes) Diabetes: No  How often do you need to have someone help you when you read instructions, pamphlets, or other written materials from your doctor or pharmacy?: 1 - Never What is the last grade level you completed in school?: 12th  Diabetic: No Nutrition Risk Assessment:  Has the patient had any N/V/D within the last 2 months?  Yes , diarrhea sometimes  Does the patient have any non-healing wounds?  No  Has the patient had any unintentional weight loss or weight gain?  No   Diabetes:  Is the patient diabetic?  No  If diabetic, was a CBG obtained today?  N/A Did the patient bring in their glucometer from home?  N/A How often do you monitor your CBG's? N/A.   Financial Strains and Diabetes Management:  Are you having any financial strains with the device, your supplies or your medication? N/A.  Does the patient want to be seen by Chronic Care Management for management of their diabetes?  N/A Would the patient like to be referred to a Nutritionist or for  Diabetic Management?  N/A    Interpreter Needed?: No  Information entered by :: CJohnson, LPN   Activities of Daily Living In your present state of health, do you have any difficulty performing the following activities: 11/22/2020  Hearing? Y  Comment needs hearing aids  Vision? Y  Comment blurred vision  Difficulty concentrating or making decisions? N  Walking or climbing stairs? N  Dressing or bathing? N  Doing errands, shopping? N  Preparing Food and eating ? N  Using the Toilet? N  In the past six months, have you accidently leaked urine? N  Do you have problems with loss of bowel control? N  Managing your Medications? N  Managing your Finances? N  Housekeeping or managing your Housekeeping? N  Some recent data might be hidden    Patient Care Team: [name], MD as PCP - General  Indicate any recent Medical Services you may have received from other than [facility] providers in the past year (date may be approximate).     Assessment:   This is a routine wellness examination for Hayleen.  Hearing/Vision screen  Hearing Screening   125Hz  250Hz  500Hz  1000Hz  2000Hz  3000Hz  4000Hz  6000Hz  8000Hz   Right ear:           Left ear:           Vision Screening Comments: Patient gets annual eye exams   Dietary issues and exercise activities discussed: Current Exercise Habits: Home exercise routine, Type of exercise: walking, Time (Minutes): 30, Frequency (Times/Week): 7, Weekly Exercise (Minutes/Week): 210, Intensity: Moderate, Exercise limited by: None identified  Goals    . Patient Stated     Starting 11/11/2018, I will continue to take medications as prescribed.     . Patient Stated     11/20/2019, I will maintain and continue medications as prescribed.     . Patient Stated     11/22/2020, I will continue walking my dog everyday for about 30 minutes.      Depression Screen PHQ 2/9 Scores 11/22/2020 11/20/2019 11/11/2018 11/09/2017  PHQ - 2 Score 2 2  0 0  PHQ- 9 Score 5  2 0 -    Fall Risk Fall Risk  11/22/2020 11/20/2019 11/11/2018  Falls in the past year? 0 0 0  Number falls in past yr: 0 0 -  Injury with Fall? 0 0 -  Risk for fall due to : Medication side effect Medication side effect -  Follow up Falls evaluation completed;Falls prevention discussed Falls prevention discussed;Falls evaluation completed -    FALL RISK PREVENTION PERTAINING TO THE HOME:  Any stairs in or around the home? Yes  If so, are there any without handrails? No  Home free of loose throw rugs in walkways, pet beds, electrical cords, etc? Yes  Adequate lighting in your home to reduce risk of falls? Yes   ASSISTIVE DEVICES UTILIZED TO PREVENT FALLS:  Life alert? No  Use of a cane, walker or w/c? No  Grab bars in the bathroom? No  Shower chair or bench in shower? No  Elevated toilet seat or a handicapped toilet? No   TIMED UP AND GO:  Was the test performed? N/A virtual visit .    Cognitive Function: MMSE - Mini Mental State Exam 11/22/2020 11/20/2019 11/11/2018  Orientation to time 5 5 5   Orientation to Place 5 5 5   Registration 3 3 3   Attention/ Calculation 5 5 0  Recall 3 3 3   Language- name 2 objects - - 0  Language- repeat 1 1 1   Language- follow 3 step command - - 3  Language- read & follow direction - - 0  Write a sentence - - 0  Copy design - - 0  Total score - - 20  Mini Cog  Mini-Cog screen was completed. Maximum score is 22. A value of 0 denotes this part of the MMSE was not completed or the patient failed this part of the Mini-Cog screening.       Immunizations Immunization History  Administered Date(s) Administered  . Fluad Quad(high Dose 65+) 09/07/2019  . Hepatitis B 04/23/2013, 05/01/2013  . Hepatitis B, ped/adol 09/25/2013  . Influenza Whole 07/26/2008  . Influenza,inj,Quad PF,6+ Mos 08/13/2014, 09/01/2018  . Influenza-Unspecified 08/04/2016, 07/30/2017  . PFIZER(Purple Top)SARS-COV-2 Vaccination 06/07/2020, 06/28/2020  . Pneumococcal  Conjugate-13 11/09/2017  . Pneumococcal Polysaccharide-23 03/21/2013, 11/24/2019  . Td 08/07/2004  . Tdap 06/11/2014  . Zoster 08/24/2016    TDAP status: Up to date  Flu Vaccine status: Declined, Education has been provided regarding the importance of this vaccine but patient still declined. Advised may receive this vaccine at local pharmacy or Health Dept. Aware to provide a copy of the vaccination record if obtained from local pharmacy or Health Dept. Verbalized acceptance and understanding.  Pneumococcal vaccine status: Up to date  Covid-19 vaccine status: Completed vaccines  Qualifies for Shingles Vaccine? Yes   Zostavax completed Yes   Shingrix Completed?: No.    Education has been provided regarding the importance of this vaccine. Patient has been advised to call insurance company to determine out of pocket expense if they have not yet received this vaccine. Advised may also receive vaccine at local pharmacy or Health Dept. Verbalized acceptance and understanding.  Screening Tests Health Maintenance  Topic Date Due  . INFLUENZA VACCINE  05/26/2020  . MAMMOGRAM  12/19/2020  . COVID-19 Vaccine (3 - Booster for Pfizer series) 12/26/2020  . COLONOSCOPY (Pts 45-82yr Insurance coverage will need to be confirmed)  01/15/2023  . TETANUS/TDAP  06/11/2024  . DEXA SCAN  Completed  . Hepatitis C Screening  Completed  . PNA vac Low Risk Adult  Completed    Health Maintenance  Health Maintenance Due  Topic Date Due  . INFLUENZA VACCINE  05/26/2020    Colorectal cancer screening: Type of screening: Colonoscopy. Completed 01/15/2020. Repeat every 3 years  Mammogram status: Completed 12/20/2019. Repeat every year  Bone Density status: declined  Lung Cancer Screening: (Low Dose CT Chest recommended if Age 40-80 years, 30 pack-year currently smoking OR have quit w/in 15years.) does not qualify.  Additional Screening:  Hepatitis C Screening: does qualify; Completed  07/26/2015  Vision Screening: Recommended annual ophthalmology exams for early detection of glaucoma and other disorders of the eye. Is the patient up to date with their annual eye exam?  Yes  Who is the provider or what is the name of the office in which the patient attends annual eye exams? In process of finding a new doctor  If pt is not established with a provider, would they like to be referred to a provider to establish care? No .   Dental Screening: Recommended annual dental exams for proper oral hygiene  Community Resource Referral / Chronic Care Management: CRR required this visit?  No   CCM required this visit?  No      Plan:     I have personally reviewed and noted the following in the patient's chart:   . Medical and social history . Use of alcohol, tobacco or illicit drugs  . Current medications and supplements . Functional ability and status . Nutritional status . Physical activity . Advanced directives . List of other physicians . Hospitalizations, surgeries, and ER visits in previous 12 months . Vitals . Screenings to include cognitive, depression, and falls . Referrals and appointments  In addition, I have reviewed and discussed with patient certain preventive protocols, quality metrics, and best practice recommendations. A written personalized care plan for preventive services as well as general preventive health recommendations were provided to patient.   Due to this being a virtual visit, the after visit summary with patients personalized plan was offered to patient via office or my-chart. Patient preferred to pick up at office at next visit or via mychart.   Andrez Grime, LPN   6/68/1594

## 2020-11-22 NOTE — Patient Instructions (Signed)
Michelle Vasquez , Thank you for taking time to come for your Medicare Wellness Visit. I appreciate your ongoing commitment to your health goals. Please review the following plan we discussed and let me know if I can assist you in the future.   Screening recommendations/referrals: Colonoscopy: Up to date, completed 01/15/2020, due 12/2022 Mammogram: Up to date, completed 12/20/2019, due 11/2020 Bone Density: declined Recommended yearly ophthalmology/optometry visit for glaucoma screening and checkup Recommended yearly dental visit for hygiene and checkup  Vaccinations: Influenza vaccine: declined Pneumococcal vaccine: Completed series Tdap vaccine: Up to date, completed 06/11/2014, due 05/2024 Shingles vaccine: due, check with your insurance regarding coverage if interested    Covid-19:Completed series  Advanced directives: Please bring a copy of your POA (Power of [place]) and/or Living Will to your next appointment.   Conditions/risks identified: hypertension  Next appointment: Follow up in one year for your annual wellness visit    Preventive Care 45 Years and Older, Female Preventive care refers to lifestyle choices and visits with your health care provider that can promote health and wellness. What does preventive care include?  A yearly physical exam. This is also called an annual well check.  Dental exams once or twice a year.  Routine eye exams. Ask your health care provider how often you should have your eyes checked.  Personal lifestyle choices, including:  Daily care of your teeth and gums.  Regular physical activity.  Eating a healthy diet.  Avoiding tobacco and drug use.  Limiting alcohol use.  Practicing safe sex.  Taking low-dose aspirin every day.  Taking vitamin and mineral supplements as recommended by your health care provider. What happens during an annual well check? The services and screenings done by your health care provider during your annual well  check will depend on your age, overall health, lifestyle risk factors, and family history of disease. Counseling  Your health care provider may ask you questions about your:  Alcohol use.  Tobacco use.  Drug use.  Emotional well-being.  Home and relationship well-being.  Sexual activity.  Eating habits.  History of falls.  Memory and ability to understand (cognition).  Work and work Statistician.  Reproductive health. Screening  You may have the following tests or measurements:  Height, weight, and BMI.  Blood pressure.  Lipid and cholesterol levels. These may be checked every 5 years, or more frequently if you are over 28 years old.  Skin check.  Lung cancer screening. You may have this screening every year starting at age 78 if you have a 30-pack-year history of smoking and currently smoke or have quit within the past 15 years.  Fecal occult blood test (FOBT) of the stool. You may have this test every year starting at age 72.  Flexible sigmoidoscopy or colonoscopy. You may have a sigmoidoscopy every 5 years or a colonoscopy every 10 years starting at age 29.  Hepatitis C blood test.  Hepatitis B blood test.  Sexually transmitted disease (STD) testing.  Diabetes screening. This is done by checking your blood sugar (glucose) after you have not eaten for a while (fasting). You may have this done every 1-3 years.  Bone density scan. This is done to screen for osteoporosis. You may have this done starting at age 17.  Mammogram. This may be done every 1-2 years. Talk to your health care provider about how often you should have regular mammograms. Talk with your health care provider about your test results, treatment options, and if necessary, the need for  more tests. Vaccines  Your health care provider may recommend certain vaccines, such as:  Influenza vaccine. This is recommended every year.  Tetanus, diphtheria, and acellular pertussis (Tdap, Td) vaccine. You  may need a Td booster every 10 years.  Zoster vaccine. You may need this after age 60.  Pneumococcal 13-valent conjugate (PCV13) vaccine. One dose is recommended after age 67.  Pneumococcal polysaccharide (PPSV23) vaccine. One dose is recommended after age 67. Talk to your health care provider about which screenings and vaccines you need and how often you need them. This information is not intended to replace advice given to you by your health care provider. Make sure you discuss any questions you have with your health care provider. Document Released: 11/08/2015 Document Revised: 07/01/2016 Document Reviewed: 08/13/2015 Elsevier Interactive Patient Education  2017 [employer] Prevention in the Home Falls can cause injuries. They can happen to people of all ages. There are many things you can do to make your home safe and to help prevent falls. What can I do on the outside of my home?  Regularly fix the edges of walkways and driveways and fix any cracks.  Remove anything that might make you trip as you walk through a door, such as a raised step or threshold.  Trim any bushes or trees on the path to your home.  Use bright outdoor lighting.  Clear any walking paths of anything that might make someone trip, such as rocks or tools.  Regularly check to see if handrails are loose or broken. Make sure that both sides of any steps have handrails.  Any raised decks and porches should have guardrails on the edges.  Have any leaves, snow, or ice cleared regularly.  Use sand or salt on walking paths during winter.  Clean up any spills in your garage right away. This includes oil or grease spills. What can I do in the bathroom?  Use night lights.  Install grab bars by the toilet and in the tub and shower. Do not use towel bars as grab bars.  Use non-skid mats or decals in the tub or shower.  If you need to sit down in the shower, use a plastic, non-slip stool.  Keep the floor  dry. Clean up any water that spills on the floor as soon as it happens.  Remove soap buildup in the tub or shower regularly.  Attach bath mats securely with double-sided non-slip rug tape.  Do not have throw rugs and other things on the floor that can make you trip. What can I do in the bedroom?  Use night lights.  Make sure that you have a light by your bed that is easy to reach.  Do not use any sheets or blankets that are too big for your bed. They should not hang down onto the floor.  Have a firm chair that has side arms. You can use this for support while you get dressed.  Do not have throw rugs and other things on the floor that can make you trip. What can I do in the kitchen?  Clean up any spills right away.  Avoid walking on wet floors.  Keep items that you use a lot in easy-to-reach places.  If you need to reach something above you, use a strong step stool that has a grab bar.  Keep electrical cords out of the way.  Do not use floor polish or wax that makes floors slippery. If you must use wax, use non-skid  floor wax.  Do not have throw rugs and other things on the floor that can make you trip. What can I do with my stairs?  Do not leave any items on the stairs.  Make sure that there are handrails on both sides of the stairs and use them. Fix handrails that are broken or loose. Make sure that handrails are as long as the stairways.  Check any carpeting to make sure that it is firmly attached to the stairs. Fix any carpet that is loose or worn.  Avoid having throw rugs at the top or bottom of the stairs. If you do have throw rugs, attach them to the floor with carpet tape.  Make sure that you have a light switch at the top of the stairs and the bottom of the stairs. If you do not have them, ask someone to add them for you. What else can I do to help prevent falls?  Wear shoes that:  Do not have high heels.  Have rubber bottoms.  Are comfortable and fit you  well.  Are closed at the toe. Do not wear sandals.  If you use a stepladder:  Make sure that it is fully opened. Do not climb a closed stepladder.  Make sure that both sides of the stepladder are locked into place.  Ask someone to hold it for you, if possible.  Clearly mark and make sure that you can see:  Any grab bars or handrails.  First and last steps.  Where the edge of each step is.  Use tools that help you move around (mobility aids) if they are needed. These include:  Canes.  Walkers.  Scooters.  Crutches.  Turn on the lights when you go into a dark area. Replace any light bulbs as soon as they burn out.  Set up your furniture so you have a clear path. Avoid moving your furniture around.  If any of your floors are uneven, fix them.  If there are any pets around you, be aware of where they are.  Review your medicines with your doctor. Some medicines can make you feel dizzy. This can increase your chance of falling. Ask your doctor what other things that you can do to help prevent falls. This information is not intended to replace advice given to you by your health care provider. Make sure you discuss any questions you have with your health care provider. Document Released: 08/08/2009 Document Revised: 03/19/2016 Document Reviewed: 11/16/2014 Elsevier Interactive Patient Education  2017 [employer].

## 2020-11-25 ENCOUNTER — Other Ambulatory Visit: Payer: Self-pay

## 2020-11-25 ENCOUNTER — Ambulatory Visit (INDEPENDENT_AMBULATORY_CARE_PROVIDER_SITE_OTHER): Payer: Medicare HMO | Admitting: Family Medicine

## 2020-11-25 ENCOUNTER — Encounter: Payer: Self-pay | Admitting: Family Medicine

## 2020-11-25 ENCOUNTER — Ambulatory Visit
Admission: RE | Admit: 2020-11-25 | Discharge: 2020-11-25 | Disposition: A | Discharge status: Home / Self Care | Payer: Medicare HMO | Source: Ambulatory Visit | Attending: Surgery | Admitting: Surgery

## 2020-11-25 VITALS — BP 140/68 | HR 81 | Temp 97.7°F | Ht 59.75 in | Wt 117.4 lb

## 2020-11-25 DIAGNOSIS — D508 Other iron deficiency anemias: Secondary | ICD-10-CM

## 2020-11-25 DIAGNOSIS — E538 Deficiency of other specified B group vitamins: Secondary | ICD-10-CM | POA: Diagnosis not present

## 2020-11-25 DIAGNOSIS — Z9049 Acquired absence of other specified parts of digestive tract: Secondary | ICD-10-CM | POA: Diagnosis not present

## 2020-11-25 DIAGNOSIS — M8580 Other specified disorders of bone density and structure, unspecified site: Secondary | ICD-10-CM

## 2020-11-25 DIAGNOSIS — K7689 Other specified diseases of liver: Secondary | ICD-10-CM | POA: Diagnosis not present

## 2020-11-25 DIAGNOSIS — Z0001 Encounter for general adult medical examination with abnormal findings: Secondary | ICD-10-CM

## 2020-11-25 DIAGNOSIS — G4700 Insomnia, unspecified: Secondary | ICD-10-CM | POA: Diagnosis not present

## 2020-11-25 DIAGNOSIS — K449 Diaphragmatic hernia without obstruction or gangrene: Secondary | ICD-10-CM | POA: Diagnosis not present

## 2020-11-25 DIAGNOSIS — Z23 Encounter for immunization: Secondary | ICD-10-CM

## 2020-11-25 DIAGNOSIS — Z7189 Other specified counseling: Secondary | ICD-10-CM | POA: Diagnosis not present

## 2020-11-25 DIAGNOSIS — F419 Anxiety disorder, unspecified: Secondary | ICD-10-CM | POA: Diagnosis not present

## 2020-11-25 DIAGNOSIS — K50118 Crohn's disease of large intestine with other complication: Secondary | ICD-10-CM

## 2020-11-25 DIAGNOSIS — K828 Other specified diseases of gallbladder: Secondary | ICD-10-CM | POA: Diagnosis not present

## 2020-11-25 DIAGNOSIS — I10 Essential (primary) hypertension: Secondary | ICD-10-CM

## 2020-11-25 DIAGNOSIS — K50919 Crohn's disease, unspecified, with unspecified complications: Secondary | ICD-10-CM

## 2020-11-25 DIAGNOSIS — K5090 Crohn's disease, unspecified, without complications: Secondary | ICD-10-CM | POA: Diagnosis not present

## 2020-11-25 DIAGNOSIS — F32A Depression, unspecified: Secondary | ICD-10-CM

## 2020-11-25 DIAGNOSIS — K6389 Other specified diseases of intestine: Secondary | ICD-10-CM | POA: Diagnosis not present

## 2020-11-25 MED ORDER — FERROUS SULFATE 325 (65 FE) MG PO TABS: 325.0000 mg | ORAL_TABLET | ORAL | Status: DC

## 2020-11-25 MED ORDER — TRAZODONE HCL 50 MG PO TABS: 25.0000 mg | ORAL_TABLET | Freq: Every evening | ORAL | 3 refills | Status: DC | PRN

## 2020-11-25 MED ORDER — CYANOCOBALAMIN 1000 MCG/ML IJ SOLN
1000.0000 ug | Freq: Once | INTRAMUSCULAR | Status: AC
  Administered 2020-11-25: 1000 ug via INTRAMUSCULAR

## 2020-11-25 MED ORDER — IOPAMIDOL (ISOVUE-300) INJECTION 61%
100.0000 mL | Freq: Once | INTRAVENOUS | Status: AC | PRN
  Administered 2020-11-25: 100 mL via INTRAVENOUS

## 2020-11-25 NOTE — Assessment & Plan Note (Addendum)
Levels low - she has been off replacement.  Start b12 shots in our office Q2 weeks for 1 month then monthly.  Will need long term given Crohn's diease.

## 2020-11-25 NOTE — Progress Notes (Signed)
Patient ID: Michelle Vasquez, female    DOB: Apr 27, 1951, 70 y.o.   MRN: [id number]  This visit was conducted in person.  BP 140/68 (BP Location: Right Arm, Patient Position: Sitting, Cuff Size: Normal)   Pulse 81   Temp 97.7 F (36.5 C) (Temporal)   Ht 4' 11.75" (1.518 m)   Wt 117 lb 6 oz (53.2 kg)   SpO2 98%   BMI 23.12 kg/m    CC: CPE Subjective:   HPI: Michelle Vasquez is a 70 y.o. female presenting on 11/25/2020 for Annual Exam (Prt 2. )   Saw health advisor last week for medicare wellness visit. Note reviewed.    No exam data present  Flowsheet Row Clinical Support from 11/22/2020 in [place] [facility] [place]  PHQ-2 Total Score 2      Fall Risk  11/22/2020 11/20/2019 11/11/2018  Falls in the past year? 0 0 0  Number falls in past yr: 0 0 -  Injury with Fall? 0 0 -  Risk for fall due to : Medication side effect Medication side effect -  Follow up Falls evaluation completed;Falls prevention discussed Falls prevention discussed;Falls evaluation completed -  hasn't recently done b12 shots. Would like to establish with our office.  Poorly functioning gallbladder based on HIDA scan - referred to gen surg ([facility]) who didn't recommend treatment. Most recently had CT today planned f/u with gen surgery. Patient wants gallbladder removed - notes ongoing flares, recently worsening. Now on entocort 12m daily.   Daughter had COVID 4 wks ago, pt had mild symptoms but never got tested. Largely resolved.   Notes worsening anxiety over last 6 months, longstanding h/o this. Increased irritability, poor sleep - trouble falling asleep despite melatonin. Vivid dreams. Doesn't think pandemic related. Interested in daily medication.   Recent labs through Dr BRosalie Gums Preventative: COLONOSCOPY Date: 02/2015 benign biopsies, crohn's with stenotic stricture at anastomosis (Olevia Perches.  COLONOSCOPY 12/2019 - small adenomas, crohn's disease small intestine, diverticulosis, non-patent end  to side ileocolonic anastomosis with inflammation and ulceration biopsied (no dysplasia), rpt 3 yrs ([facility]) Well woman - s/p total hysterectomy for fibroids, ovaries removed at age 70yo Took hormone replacementonly for a few months, stopped early but unclear reason. DEXA spine -1.7, hip -1.6 02/2018  Mammogram Birads1 11/2019 @ [place] Lung cancer screening - not eligible Flu shot yearly CWahoo8/2021, 06/2020 Tdap 2005, 2015 Pneumovax 2014.Prevnar2019. Pneumovax 10/2019 zostavax - 2017 shingrix - discussed - will check with pharmacy.  Completed Hep B series.  PPD at work yearly. Advanced directive - completed at home. Wants daughter to be HCPOA asked [place].  Seat belt use discussed.  Sunscreen use discussed. No suspicious moles.  Ex smokerquit remotely- but daughter smokes outdoors Alcohol - none Dentist q6 mo (saw yesterday)  Eye exam yearly  Bowel - h/o crohn's with loose stools, no constipation Bladder - no incontinence  Caffeine: 2 cups coffee/day  Divorced 1989  3 children  Medical transcriptionist  Activity:walks dog1 mi 4-5 times a week. Diet: good water, fruits/vegetablesok     Relevant past medical, surgical, family and social history reviewed and updated as indicated. Interim medical history since our last visit reviewed. Allergies and medications reviewed and updated. Outpatient Medications Prior to Visit  Medication Sig Dispense Refill  . amLODipine (NORVASC) 5 MG tablet TAKE 1 TABLET BY MOUTH ONCE A DAY 90 tablet 1  . budesonide (ENTOCORT EC) 3 MG 24 hr capsule 1 capsule    .  cetirizine (ZYRTEC) 10 MG tablet TAKE ONE TABLET DAILY AS DIRECTED 100 tablet 3  . Cholecalciferol (VITAMIN D) 50 MCG (2000 UT) CAPS Take 1 capsule (2,000 Units total) by mouth daily. 90 capsule 3  . LINZESS 72 MCG capsule TAKE 1 CAPSULE BY MOUTH ONCE DAILY BEFORE BREAKFAST 90 capsule 2  . mercaptopurine (PURINETHOL) 50 MG tablet TAKE 1 TABLET BY  MOUTH DAILY 1 HOUR BEFORE OR 2 HOURS AFTER MEALS ON AN EMPTY STOMACH. 90 tablet 3  . pantoprazole (PROTONIX) 40 MG tablet Take 1 tablet by mouth daily.    . Syringe, Disposable, (BD TUBERCULIN SYRINGE) 1 ML MISC 6 syringes for 1 month for B12 injections twice a month 6 each 6  . FEROSUL 325 (65 Fe) MG tablet TAKE 1 TABLET BY MOUTH EVERY OTHER DAY 90 tablet 1  . cyanocobalamin (,VITAMIN B-12,) 1000 MCG/ML injection INJECT 1 ML EVERY 2 WEEKS AS DIRECTED (Patient not taking: Reported on 11/25/2020) 234 mL 2   Facility-Administered Medications Prior to Visit  Medication Dose Route Frequency Provider Last Rate Last Admin  . cyanocobalamin ((VITAMIN B-12)) injection 1,000 mcg  1,000 mcg Intramuscular Q30 days [name], MD   1,000 mcg at 01/19/17 1524     Per HPI unless specifically indicated in ROS section below Review of Systems  Constitutional: Negative for activity change, appetite change, chills, fatigue, fever and unexpected weight change.  HENT: Negative for hearing loss.   Eyes: Negative for visual disturbance.  Respiratory: Negative for cough, chest tightness, shortness of breath and wheezing.   Cardiovascular: Negative for chest pain, palpitations and leg swelling.  Gastrointestinal: Positive for abdominal pain and diarrhea. Negative for abdominal distention, blood in stool, constipation, nausea and vomiting.  Genitourinary: Negative for difficulty urinating and hematuria.  Musculoskeletal: Negative for arthralgias, myalgias and neck pain.  Skin: Negative for rash.  Neurological: Negative for dizziness, seizures, syncope and headaches.  Hematological: Negative for adenopathy. Does not bruise/bleed easily.  Psychiatric/Behavioral: Negative for dysphoric mood. The patient is nervous/anxious.    Objective:  BP 140/68 (BP Location: Right Arm, Patient Position: Sitting, Cuff Size: Normal)   Pulse 81   Temp 97.7 F (36.5 C) (Temporal)   Ht 4' 11.75" (1.518 m)   Wt 117 lb 6 oz  (53.2 kg)   SpO2 98%   BMI 23.12 kg/m   Wt Readings from Last 3 Encounters:  11/25/20 117 lb 6 oz (53.2 kg)  05/23/20 113 lb 8 oz (51.5 kg)  05/20/20 119 lb (54 kg)      Physical Exam Vitals and nursing note reviewed.  Constitutional:      General: She is not in acute distress.    Appearance: Normal appearance. She is well-developed and well-nourished. She is not ill-appearing.  HENT:     Head: Normocephalic and atraumatic.     Right Ear: Hearing, tympanic membrane, ear canal and external ear normal.     Left Ear: Hearing, tympanic membrane, ear canal and external ear normal.     Mouth/Throat:     Mouth: Oropharynx is clear and moist and mucous membranes are normal.     Pharynx: Uvula midline. No posterior oropharyngeal edema.  Eyes:     General: No scleral icterus.    Extraocular Movements: Extraocular movements intact and EOM normal.     Conjunctiva/sclera: Conjunctivae normal.     Pupils: Pupils are equal, round, and reactive to light.  Neck:     Thyroid: No thyroid mass or thyromegaly.     Vascular: No carotid  bruit.  Cardiovascular:     Rate and Rhythm: Normal rate and regular rhythm.     Pulses: Normal pulses and intact distal pulses.          Radial pulses are 2+ on the right side and 2+ on the left side.     Heart sounds: Normal heart sounds. No murmur heard.   Pulmonary:     Effort: Pulmonary effort is normal. No respiratory distress.     Breath sounds: Normal breath sounds. No wheezing, rhonchi or rales.  Abdominal:     General: Abdomen is flat. Bowel sounds are normal. There is distension (bloating).     Palpations: Abdomen is soft. There is no mass.     Tenderness: There is generalized abdominal tenderness (mild discomfort). There is no guarding or rebound.     Hernia: No hernia is present.  Musculoskeletal:        General: No edema. Normal range of motion.     Cervical back: Normal range of motion and neck supple.  Lymphadenopathy:     Cervical: No  cervical adenopathy.  Skin:    General: Skin is warm and dry.     Findings: No rash.  Neurological:     General: No focal deficit present.     Mental Status: She is alert and oriented to person, place, and time.     Comments: CN grossly intact, station and gait intact  Psychiatric:        Mood and Affect: Mood and affect and mood normal.        Behavior: Behavior normal.        Thought Content: Thought content normal.        Judgment: Judgment normal.       Results for orders placed or performed in visit on 11/21/20  IBC panel  Result Value Ref Range   Iron 191 (H) 42 - 145 ug/dL   Transferrin 335.0 212.0 - 360.0 mg/dL   Saturation Ratios 40.7 20.0 - 50.0 %  Ferritin  Result Value Ref Range   Ferritin 49.8 10.0 - 291.0 ng/mL  Vitamin B12  Result Value Ref Range   Vitamin B-12 143 (L) 211 - 911 pg/mL  CBC with Differential/Platelet  Result Value Ref Range   WBC 6.7 4.0 - 10.5 K/uL   RBC 3.64 (L) 3.87 - 5.11 Mil/uL   Hemoglobin 12.4 12.0 - 15.0 g/dL   HCT 36.6 36.0 - 46.0 %   MCV 100.6 (H) 78.0 - 100.0 fl   MCHC 33.7 30.0 - 36.0 g/dL   RDW 14.4 11.5 - 15.5 %   Platelets 316.0 150.0 - 400.0 K/uL   Neutrophils Relative % 59.9 43.0 - 77.0 %   Lymphocytes Relative 31.5 12.0 - 46.0 %   Monocytes Relative 6.7 3.0 - 12.0 %   Eosinophils Relative 1.4 0.0 - 5.0 %   Basophils Relative 0.5 0.0 - 3.0 %   Neutro Abs 4.0 1.4 - 7.7 K/uL   Lymphs Abs 2.1 0.7 - 4.0 K/uL   Monocytes Absolute 0.4 0.1 - 1.0 K/uL   Eosinophils Absolute 0.1 0.0 - 0.7 K/uL   Basophils Absolute 0.0 0.0 - 0.1 K/uL  Microalbumin / creatinine urine ratio  Result Value Ref Range   Microalb, Ur 0.8 0.0 - 1.9 mg/dL   Creatinine,U 18.2 mg/dL   Microalb Creat Ratio 4.3 0.0 - 30.0 mg/g   Depression screen [facility] 2/9 11/22/2020 11/20/2019 11/11/2018 11/09/2017  Decreased Interest 1 1 0 0  Down, Depressed, Hopeless 1  1 0 0  PHQ - 2 Score 2 2 0 0  Altered sleeping 3 0 0 -  Tired, decreased energy 0 0 0 -  Change in  appetite 0 0 0 -  Feeling bad or failure about yourself  0 0 0 -  Trouble concentrating 0 0 0 -  Moving slowly or fidgety/restless 0 0 0 -  Suicidal thoughts 0 0 0 -  PHQ-9 Score 5 2 0 -  Difficult doing work/chores Not difficult at all Not difficult at all Not difficult at all -  Some recent data might be hidden   No flowsheet data found.  Assessment & Plan:  This visit occurred during the SARS-CoV-2 public health emergency.  Safety protocols were in place, including screening questions prior to the visit, additional usage of staff PPE, and extensive cleaning of exam room while observing appropriate contact time as indicated for disinfecting solutions.   Problem List Items Addressed This Visit    Vitamin B12 deficiency    Levels low - she has been off replacement.  Start b12 shots in our office Q2 weeks for 1 month then monthly.  Will need long term given Crohn's diease.      S/P partial colectomy   Osteopenia    Discussed calcium, vit D and regular weight bearing exercise.       Iron deficiency anemia    Levels stable. Drop oral replacement to MWF dosing.       Relevant Medications   ferrous sulfate (FEROSUL) 325 (65 FE) MG tablet   Insomnia    Melatonin ineffective. Trial trazodone at night for sleep.       Essential hypertension    Chronic, stable. Continue current regimen.       Encounter for general adult medical examination with abnormal findings - Primary    Preventative protocols reviewed and updated unless pt declined. Discussed healthy diet and lifestyle.       Dysfunctional gallbladder    Identified by HIDA scan, rec against surgery at this time given ongoing active inflammation at cecal anastomosis due to Crohn's.      Crohn's disease ([place])    Appreciate GI care ([facility]).  Currently in active flare - GI started on entocort 25m daily.  Reviewed latest imaging with patient. Planned f/u with GI/surg.       Anxiety and depression    Did not  complete GAD7.  See HPI. Start trazodone at night for mood, sleep, RTC 2 mo f/u visit.       Relevant Medications   traZODone (DESYREL) 50 MG tablet   Advanced directives, counseling/discussion    Advanced directive - completed at home. Wants daughter to be HCPOA asked [place].        Other Visit Diagnoses    Need for influenza vaccination       Relevant Orders   Flu Vaccine QUAD High Dose(Fluad) (Completed)       Meds ordered this encounter  Medications  . traZODone (DESYREL) 50 MG tablet    Sig: Take 0.5-1 tablets (25-50 mg total) by mouth at bedtime as needed for sleep.    Dispense:  30 tablet    Refill:  3  . ferrous sulfate (FEROSUL) 325 (65 FE) MG tablet    Sig: Take 1 tablet (325 mg total) by mouth every Monday, Wednesday, and Friday.  . cyanocobalamin ((VITAMIN B-12)) injection 1,000 mcg   Orders Placed This Encounter  Procedures  . Flu Vaccine QUAD High Dose(Fluad)  Patient instructions: Flu shot today  B12 shot today, then schedule nurse visit in 2 weeks to receive again, then monthly.  If interested, check with pharmacy about new 2 shot shingles series (shingrix).  Drop iron to MWF dosing.  Bring [place] copy of your advanced directive.  Anxiety questionairre today. Trial mood medicine which may help sleep - trazodone 25-59m at night time.  Return in 2 months for follow up on sleeping/anxiety.  Return in 1 year for next physical/wellness visit.   Follow up plan: Return in about 1 year (around 11/25/2021), or if symptoms worsen or fail to improve, for annual exam, prior fasting for blood work, medicare wellness visit.  JRia Bush MD

## 2020-11-25 NOTE — Assessment & Plan Note (Signed)
Discussed calcium, vit D and regular weight bearing exercise.

## 2020-11-25 NOTE — Assessment & Plan Note (Signed)
Melatonin ineffective. Trial trazodone at night for sleep.

## 2020-11-25 NOTE — Assessment & Plan Note (Signed)
Preventative protocols reviewed and updated unless pt declined. Discussed healthy diet and lifestyle.  

## 2020-11-25 NOTE — Assessment & Plan Note (Signed)
Advanced directive - completed at home. Wants daughter to be HCPOA asked to bring [place] a copy.

## 2020-11-25 NOTE — Assessment & Plan Note (Signed)
Chronic, stable. Continue current regimen. 

## 2020-11-25 NOTE — Assessment & Plan Note (Signed)
Levels stable. Drop oral replacement to MWF dosing.

## 2020-11-25 NOTE — Patient Instructions (Addendum)
Flu shot today  B12 shot today, then schedule nurse visit in 2 weeks to receive again, then monthly.  If interested, check with pharmacy about new 2 shot shingles series (shingrix).  Drop iron to MWF dosing.  Bring [place] copy of your advanced directive.  Anxiety questionairre today. Trial mood medicine which may help sleep - trazodone 25-66m at night time.  Return in 2 months for follow up on sleeping/anxiety.  Return in 1 year for next physical/wellness visit.   Health Maintenance After Age 5137After age 71 you are at a higher risk for certain long-term diseases and infections as well as injuries from falls. Falls are a major cause of broken bones and head injuries in people who are older than age 71 Getting regular preventive care can help to keep you healthy and well. Preventive care includes getting regular testing and making lifestyle changes as recommended by your health care provider. Talk with your health care provider about:  Which screenings and tests you should have. A screening is a test that checks for a disease when you have no symptoms.  A diet and exercise plan that is right for you. What should I know about screenings and tests to prevent falls? Screening and testing are the best ways to find a health problem early. Early diagnosis and treatment give you the best chance of managing medical conditions that are common after age 71 Certain conditions and lifestyle choices may make you more likely to have a fall. Your health care provider may recommend:  Regular vision checks. Poor vision and conditions such as cataracts can make you more likely to have a fall. If you wear glasses, make sure to get your prescription updated if your vision changes.  Medicine review. Work with your health care provider to regularly review all of the medicines you are taking, including over-the-counter medicines. Ask your health care provider about any side effects that may make you more likely to have  a fall. Tell your health care provider if any medicines that you take make you feel dizzy or sleepy.  Osteoporosis screening. Osteoporosis is a condition that causes the bones to get weaker. This can make the bones weak and cause them to break more easily.  Blood pressure screening. Blood pressure changes and medicines to control blood pressure can make you feel dizzy.  Strength and balance checks. Your health care provider may recommend certain tests to check your strength and balance while standing, walking, or changing positions.  Foot health exam. Foot pain and numbness, as well as not wearing proper footwear, can make you more likely to have a fall.  Depression screening. You may be more likely to have a fall if you have a fear of falling, feel emotionally low, or feel unable to do activities that you used to do.  Alcohol use screening. Using too much alcohol can affect your balance and may make you more likely to have a fall. What actions can I take to lower my risk of falls? General instructions  Talk with your health care provider about your risks for falling. Tell your health care provider if: ? You fall. Be sure to tell your health care provider about all falls, even ones that seem minor. ? You feel dizzy, sleepy, or off-balance.  Take over-the-counter and prescription medicines only as told by your health care provider. These include any supplements.  Eat a healthy diet and maintain a healthy weight. A healthy diet includes low-fat dairy products, low-fat (lean) meats,  and fiber from whole grains, beans, and lots of fruits and vegetables. Home safety  Remove any tripping hazards, such as rugs, cords, and clutter.  Install safety equipment such as grab bars in bathrooms and safety rails on stairs.  Keep rooms and walkways well-lit. Activity  Follow a regular exercise program to stay fit. This will help you maintain your balance. Ask your health care provider what types of  exercise are appropriate for you.  If you need a cane or walker, use it as recommended by your health care provider.  Wear supportive shoes that have nonskid soles.   Lifestyle  Do not drink alcohol if your health care provider tells you not to drink.  If you drink alcohol, limit how much you have: ? 0-1 drink a day for women. ? 0-2 drinks a day for men.  Be aware of how much alcohol is in your drink. In the [place] one drink equals one typical bottle of beer (12 oz), one-half glass of wine (5 oz), or one shot of hard liquor (1 oz).  Do not use any products that contain nicotine or tobacco, such as cigarettes and e-cigarettes. If you need help quitting, ask your health care provider. Summary  Having a healthy lifestyle and getting preventive care can help to protect your health and wellness after age 28.  Screening and testing are the best way to find a health problem early and help you avoid having a fall. Early diagnosis and treatment give you the best chance for managing medical conditions that are more common for people who are older than age 51.  Falls are a major cause of broken bones and head injuries in people who are older than age 57. Take precautions to prevent a fall at home.  Work with your health care provider to learn what changes you can make to improve your health and wellness and to prevent falls. This information is not intended to replace advice given to you by your health care provider. Make sure you discuss any questions you have with your health care provider. Document Revised: 02/02/2019 Document Reviewed: 08/25/2017 Elsevier Patient Education  2021 [employer].

## 2020-11-25 NOTE — Assessment & Plan Note (Signed)
Identified by HIDA scan, rec against surgery at this time given ongoing active inflammation at cecal anastomosis due to Crohn's.

## 2020-11-25 NOTE — Assessment & Plan Note (Signed)
Appreciate GI care ([facility]).  Currently in active flare - GI started on entocort 3m daily.  Reviewed latest imaging with patient. Planned f/u with GI/surg.

## 2020-11-25 NOTE — Assessment & Plan Note (Signed)
Did not complete GAD7.  See HPI. Start trazodone at night for mood, sleep, RTC 2 mo f/u visit.

## 2020-12-12 ENCOUNTER — Other Ambulatory Visit: Payer: Self-pay

## 2020-12-12 ENCOUNTER — Ambulatory Visit (INDEPENDENT_AMBULATORY_CARE_PROVIDER_SITE_OTHER): Payer: Medicare HMO

## 2020-12-12 DIAGNOSIS — E538 Deficiency of other specified B group vitamins: Secondary | ICD-10-CM | POA: Diagnosis not present

## 2020-12-12 MED ORDER — CYANOCOBALAMIN 1000 MCG/ML IJ SOLN
1000.0000 ug | Freq: Once | INTRAMUSCULAR | Status: AC
  Administered 2020-12-12: 1000 ug via INTRAMUSCULAR

## 2020-12-12 NOTE — Progress Notes (Signed)
Per orders of Dr. Damita Dunnings, injection of vit F41 given by Brenton Grills. Patient tolerated injection well.

## 2021-01-06 ENCOUNTER — Encounter: Payer: Self-pay | Admitting: Family Medicine

## 2021-01-13 DIAGNOSIS — K219 Gastro-esophageal reflux disease without esophagitis: Secondary | ICD-10-CM | POA: Diagnosis not present

## 2021-01-13 DIAGNOSIS — K5000 Crohn's disease of small intestine without complications: Secondary | ICD-10-CM | POA: Diagnosis not present

## 2021-01-13 DIAGNOSIS — R948 Abnormal results of function studies of other organs and systems: Secondary | ICD-10-CM | POA: Diagnosis not present

## 2021-01-13 DIAGNOSIS — Z9049 Acquired absence of other specified parts of digestive tract: Secondary | ICD-10-CM | POA: Diagnosis not present

## 2021-01-14 ENCOUNTER — Other Ambulatory Visit: Payer: Self-pay

## 2021-01-14 ENCOUNTER — Ambulatory Visit: Payer: Medicare HMO

## 2021-01-15 ENCOUNTER — Other Ambulatory Visit: Payer: Self-pay

## 2021-01-15 ENCOUNTER — Ambulatory Visit (INDEPENDENT_AMBULATORY_CARE_PROVIDER_SITE_OTHER): Payer: Medicare HMO

## 2021-01-15 DIAGNOSIS — E538 Deficiency of other specified B group vitamins: Secondary | ICD-10-CM

## 2021-01-15 NOTE — Progress Notes (Signed)
Per orders of Dr. Danise Mina, injection of B12 given Left deltoid by Lurlean Nanny.  Patient tolerated injection well.

## 2021-01-22 ENCOUNTER — Ambulatory Visit (INDEPENDENT_AMBULATORY_CARE_PROVIDER_SITE_OTHER): Payer: Medicare HMO | Admitting: Family Medicine

## 2021-01-22 ENCOUNTER — Other Ambulatory Visit: Payer: Self-pay

## 2021-01-22 ENCOUNTER — Encounter: Payer: Self-pay | Admitting: Family Medicine

## 2021-01-22 VITALS — BP 134/80 | HR 68 | Temp 97.6°F | Ht 59.75 in | Wt 118.2 lb

## 2021-01-22 DIAGNOSIS — D508 Other iron deficiency anemias: Secondary | ICD-10-CM | POA: Diagnosis not present

## 2021-01-22 DIAGNOSIS — E538 Deficiency of other specified B group vitamins: Secondary | ICD-10-CM | POA: Diagnosis not present

## 2021-01-22 DIAGNOSIS — K50118 Crohn's disease of large intestine with other complication: Secondary | ICD-10-CM | POA: Diagnosis not present

## 2021-01-22 DIAGNOSIS — G4700 Insomnia, unspecified: Secondary | ICD-10-CM

## 2021-01-22 DIAGNOSIS — F32A Depression, unspecified: Secondary | ICD-10-CM | POA: Diagnosis not present

## 2021-01-22 DIAGNOSIS — F419 Anxiety disorder, unspecified: Secondary | ICD-10-CM

## 2021-01-22 MED ORDER — SERTRALINE HCL 25 MG PO TABS: 25.0000 mg | ORAL_TABLET | Freq: Every day | ORAL | 3 refills | Status: DC

## 2021-01-22 MED ORDER — TRAZODONE HCL 50 MG PO TABS: 25.0000 mg | ORAL_TABLET | Freq: Every evening | ORAL | 3 refills | Status: DC | PRN

## 2021-01-22 NOTE — Assessment & Plan Note (Signed)
Currently on entocort followed by [facility].

## 2021-01-22 NOTE — Progress Notes (Signed)
Patient ID: Michelle Vasquez, female    DOB: [birth year], 70 y.o.   MRN: [id number]  This visit was conducted in person.  BP 134/80   Pulse 68   Temp 97.6 F (36.4 C) (Temporal)   Ht 4' 11.75" (1.518 m)   Wt 118 lb 4 oz (53.6 kg)   SpO2 98%   BMI 23.29 kg/m    CC: 2 mo f/u visit  Subjective:   HPI: Michelle Vasquez is a 70 y.o. female presenting on 01/22/2021 for Follow-up (Here for 2 mo f/u.)   See prior note for details. Last visit increasing anxiety noted associated with irritability and poor sleep, melatonin ineffect - started on trazodone 25-50m nightly as needed. This is helping. Does note some sedation the next morning. Notes ongoing   Last b12 shot 01/15/2021. Gets these monthly in h/o IBD. Did receive Q2 wks for the first month.  Lab Results  Component Value Date   VITAMINB12 143 (L) 11/21/2020    Dysfunctional gallbladder by HIDA scan - currently continues budesonide for Crohn's flare - significant improvement in symptoms since started steroid. To see [facility] in 3 months ([place]) and consider taper at that time.      Relevant past medical, surgical, family and social history reviewed and updated as indicated. Interim medical history since our last visit reviewed. Allergies and medications reviewed and updated. Outpatient Medications Prior to Visit  Medication Sig Dispense Refill  . amLODipine (NORVASC) 5 MG tablet TAKE 1 TABLET BY MOUTH ONCE A DAY 90 tablet 1  . budesonide (ENTOCORT EC) 3 MG 24 hr capsule 1 capsule    . cetirizine (ZYRTEC) 10 MG tablet TAKE ONE TABLET DAILY AS DIRECTED 100 tablet 3  . Cholecalciferol (VITAMIN D) 50 MCG (2000 UT) CAPS Take 1 capsule (2,000 Units total) by mouth daily. 90 capsule 3  . cyanocobalamin (,VITAMIN B-12,) 1000 MCG/ML injection INJECT 1 ML EVERY 2 WEEKS AS DIRECTED 234 mL 2  . ferrous sulfate (FEROSUL) 325 (65 FE) MG tablet Take 1 tablet (325 mg total) by mouth every Monday, Wednesday, and Friday.    .Marland KitchenLINZESS 72 MCG  capsule TAKE 1 CAPSULE BY MOUTH ONCE DAILY BEFORE BREAKFAST 90 capsule 2  . mercaptopurine (PURINETHOL) 50 MG tablet TAKE 1 TABLET BY MOUTH DAILY 1 HOUR BEFORE OR 2 HOURS AFTER MEALS ON AN EMPTY STOMACH. 90 tablet 3  . pantoprazole (PROTONIX) 40 MG tablet Take 1 tablet by mouth daily.    . Syringe, Disposable, (BD TUBERCULIN SYRINGE) 1 ML MISC 6 syringes for 1 month for B12 injections twice a month 6 each 6  . traZODone (DESYREL) 50 MG tablet Take 0.5-1 tablets (25-50 mg total) by mouth at bedtime as needed for sleep. 30 tablet 3   Facility-Administered Medications Prior to Visit  Medication Dose Route Frequency Provider Last Rate Last Admin  . cyanocobalamin ((VITAMIN B-12)) injection 1,000 mcg  1,000 mcg Intramuscular Q30 days GRia Bush MD   1,000 mcg at 01/15/21 1544     Per HPI unless specifically indicated in ROS section below Review of Systems Objective:  BP 134/80   Pulse 68   Temp 97.6 F (36.4 C) (Temporal)   Ht 4' 11.75" (1.518 m)   Wt 118 lb 4 oz (53.6 kg)   SpO2 98%   BMI 23.29 kg/m   Wt Readings from Last 3 Encounters:  01/22/21 118 lb 4 oz (53.6 kg)  11/25/20 117 lb 6 oz (53.2 kg)  05/23/20 113 lb 8  oz (51.5 kg)      Physical Exam Vitals and nursing note reviewed.  Constitutional:      Appearance: Normal appearance. She is not ill-appearing.  Cardiovascular:     Rate and Rhythm: Normal rate and regular rhythm.     Pulses: Normal pulses.     Heart sounds: Normal heart sounds. No murmur heard.   Pulmonary:     Effort: Pulmonary effort is normal. No respiratory distress.     Breath sounds: Normal breath sounds. No wheezing, rhonchi or rales.  Musculoskeletal:     Right lower leg: No edema.     Left lower leg: No edema.  Skin:    General: Skin is warm and dry.     Findings: No rash.  Neurological:     Mental Status: She is alert.  Psychiatric:        Mood and Affect: Mood normal.        Behavior: Behavior normal.       Results for orders  placed or performed in visit on 11/21/20  IBC panel  Result Value Ref Range   Iron 191 (H) 42 - 145 ug/dL   Transferrin 335.0 212.0 - 360.0 mg/dL   Saturation Ratios 40.7 20.0 - 50.0 %  Ferritin  Result Value Ref Range   Ferritin 49.8 10.0 - 291.0 ng/mL  Vitamin B12  Result Value Ref Range   Vitamin B-12 143 (L) 211 - 911 pg/mL  CBC with Differential/Platelet  Result Value Ref Range   WBC 6.7 4.0 - 10.5 K/uL   RBC 3.64 (L) 3.87 - 5.11 Mil/uL   Hemoglobin 12.4 12.0 - 15.0 g/dL   HCT 36.6 36.0 - 46.0 %   MCV 100.6 (H) 78.0 - 100.0 fl   MCHC 33.7 30.0 - 36.0 g/dL   RDW 14.4 11.5 - 15.5 %   Platelets 316.0 150.0 - 400.0 K/uL   Neutrophils Relative % 59.9 43.0 - 77.0 %   Lymphocytes Relative 31.5 12.0 - 46.0 %   Monocytes Relative 6.7 3.0 - 12.0 %   Eosinophils Relative 1.4 0.0 - 5.0 %   Basophils Relative 0.5 0.0 - 3.0 %   Neutro Abs 4.0 1.4 - 7.7 K/uL   Lymphs Abs 2.1 0.7 - 4.0 K/uL   Monocytes Absolute 0.4 0.1 - 1.0 K/uL   Eosinophils Absolute 0.1 0.0 - 0.7 K/uL   Basophils Absolute 0.0 0.0 - 0.1 K/uL  Microalbumin / creatinine urine ratio  Result Value Ref Range   Microalb, Ur 0.8 0.0 - 1.9 mg/dL   Creatinine,U 18.2 mg/dL   Microalb Creat Ratio 4.3 0.0 - 30.0 mg/g   Depression screen [facility] 2/9 01/22/2021 11/22/2020 11/20/2019 11/11/2018 11/09/2017  Decreased Interest 2 1 1  0 0  Down, Depressed, Hopeless 1 1 1  0 0  PHQ - 2 Score 3 2 2  0 0  Altered sleeping 1 3 0 0 -  Tired, decreased energy 3 0 0 0 -  Change in appetite 1 0 0 0 -  Feeling bad or failure about yourself  0 0 0 0 -  Trouble concentrating 1 0 0 0 -  Moving slowly or fidgety/restless 0 0 0 0 -  Suicidal thoughts 0 0 0 0 -  PHQ-9 Score 9 5 2  0 -  Difficult doing work/chores - Not difficult at all Not difficult at all Not difficult at all -  Some recent data might be hidden    GAD 7 : Generalized Anxiety Score 01/22/2021  Nervous, Anxious, on Edge  2  Control/stop worrying 1  Worry too much - different things 2   Trouble relaxing 2  Restless 0  Easily annoyed or irritable 2  Afraid - awful might happen 3  Total GAD 7 Score 12   Assessment & Plan:  This visit occurred during the SARS-CoV-2 public health emergency.  Safety protocols were in place, including screening questions prior to the visit, additional usage of staff PPE, and extensive cleaning of exam room while observing appropriate contact time as indicated for disinfecting solutions.   Problem List Items Addressed This Visit    Vitamin B12 deficiency    Continue monthly B12 shots.  Check levels next visit      Iron deficiency anemia    Continue oral iron MWF dosing.       Crohn's disease ([place])    Currently on entocort followed by [facility].       Anxiety and depression - Primary    Chronic, stable but without significant improvement. Start sertraline 30m daily.  Consider buspar if no improvement on SSRI.  H/o racing thoughts to celexa.       Relevant Medications   sertraline (ZOLOFT) 25 MG tablet   traZODone (DESYREL) 50 MG tablet   Insomnia    Doing ok on trazodone 244mnightly - will continue this.           Meds ordered this encounter  Medications  . sertraline (ZOLOFT) 25 MG tablet    Sig: Take 1 tablet (25 mg total) by mouth daily.    Dispense:  30 tablet    Refill:  3  . traZODone (DESYREL) 50 MG tablet    Sig: Take 0.5-1 tablets (25-50 mg total) by mouth at bedtime as needed for sleep.    Dispense:  50 tablet    Refill:  3   No orders of the defined types were placed in this encounter.   Patient Instructions  Schedule next b12 shot 1 month from prior when you leave today.  Continue trazodone 2549ms needed for sleep.  Trial sertraline 64m43mily - take in the mornings. Take for a month then we will reassess mood. Let us kKoreaw how you do with this.  Return as needed or in 3 months for follow up visit, try to schedule around time you would be for b12 shot.   Follow up plan: Return in about 3 months  (around 04/24/2021), or if symptoms worsen or fail to improve.  JaviRia Bush

## 2021-01-22 NOTE — Assessment & Plan Note (Signed)
Continue oral iron MWF dosing.

## 2021-01-22 NOTE — Assessment & Plan Note (Signed)
Chronic, stable but without significant improvement. Start sertraline 92m daily.  Consider buspar if no improvement on SSRI.  H/o racing thoughts to celexa.

## 2021-01-22 NOTE — Assessment & Plan Note (Signed)
Continue monthly B12 shots.  Check levels next visit

## 2021-01-22 NOTE — Patient Instructions (Addendum)
Schedule next b12 shot 1 month from prior when you leave today.  Continue trazodone 35m as needed for sleep.  Trial sertraline 225mdaily - take in the mornings. Take for a month then we will reassess mood. Let usKoreanow how you do with this.  Return as needed or in 3 months for follow up visit, try to schedule around time you would be for b12 shot.

## 2021-01-22 NOTE — Assessment & Plan Note (Signed)
Doing ok on trazodone 96m nightly - will continue this.

## 2021-02-19 ENCOUNTER — Other Ambulatory Visit: Payer: Self-pay

## 2021-02-19 ENCOUNTER — Ambulatory Visit (INDEPENDENT_AMBULATORY_CARE_PROVIDER_SITE_OTHER): Payer: Medicare HMO

## 2021-02-19 DIAGNOSIS — E538 Deficiency of other specified B group vitamins: Secondary | ICD-10-CM

## 2021-02-19 MED ORDER — CYANOCOBALAMIN 1000 MCG/ML IJ SOLN
1000.0000 ug | Freq: Once | INTRAMUSCULAR | Status: AC
  Administered 2021-02-19: 1000 ug via INTRAMUSCULAR

## 2021-02-19 NOTE — Progress Notes (Signed)
Patient presented for B 12 injection given by Sherrilee Gilles, CMA to left deltoid, patient voiced no concerns nor showed any signs of distress during injection.

## 2021-03-03 ENCOUNTER — Telehealth: Payer: Self-pay

## 2021-03-03 ENCOUNTER — Telehealth: Payer: Self-pay | Admitting: Family Medicine

## 2021-03-03 DIAGNOSIS — U071 COVID-19: Secondary | ICD-10-CM

## 2021-03-03 HISTORY — DX: COVID-19: U07.1

## 2021-03-03 MED ORDER — ONDANSETRON HCL 4 MG PO TABS: 4.0000 mg | ORAL_TABLET | Freq: Three times a day (TID) | ORAL | 0 refills | Status: DC | PRN

## 2021-03-03 MED ORDER — NIRMATRELVIR/RITONAVIR (PAXLOVID)TABLET: 3.0000 | ORAL_TABLET | Freq: Two times a day (BID) | ORAL | 0 refills | Status: AC

## 2021-03-03 MED ORDER — NIRMATRELVIR/RITONAVIR (PAXLOVID)TABLET: 3.0000 | ORAL_TABLET | Freq: Two times a day (BID) | ORAL | 0 refills | Status: DC

## 2021-03-03 NOTE — Telephone Encounter (Signed)
Left message on voicemail for patient to call the office back.

## 2021-03-03 NOTE — Telephone Encounter (Signed)
Spoke with patient. Patient states that she would like Zofran sent in to [facility] because the new medication for COVID that Dr G sent in for her today will make her sick to her stomach.  The medication for COVID that was sent to [place] needs to be re [facility] [address] because [place] does not have it in stock.  CB is [phone]

## 2021-03-03 NOTE — Telephone Encounter (Signed)
Patient called and stated that on Friday she began experiencing symptoms of fever, chills, nasal congestion, H/A, cough, body aches, nausea, and diarrhea that are persisting. Patient denies SOB, chest pain, or vomiting. Patient stated that she took an at home COVID test this morning and it was positive. Instructed patient that I would notify Dr. Danise Mina, but in the meantime she should quarantine, drink plenty of fluids to stay hydrated, eat a well balanced meal, rest, and manage fever. UC and ED precautions given. Patient verbalized understanding. Please advise.

## 2021-03-03 NOTE — Telephone Encounter (Signed)
plz notify Rx's sent in

## 2021-03-03 NOTE — Telephone Encounter (Signed)
Spoke with patient, sent in paxlovid full dose. plz touch base with patient on Wednesday for update on symptoms.

## 2021-03-03 NOTE — Addendum Note (Signed)
Addended by: [name] on: 03/03/2021 02:02 PM   Modules accepted: Orders

## 2021-03-03 NOTE — Telephone Encounter (Signed)
Patient notified that Rx's  have been sent to the pharmacy. Patient stated that [facility] not [facility] the Paxlovid but they are [facility] another [facility] to get it.

## 2021-03-03 NOTE — Telephone Encounter (Signed)
Pt called and stated that she is  Needing the zofran to go to a different pharmacy due to [facility] does not have the script.    [facility] [id number]

## 2021-03-03 NOTE — Telephone Encounter (Signed)
First day of symptoms: 02/28/2021 Tested COVID positive: 03/03/2021  Current symptoms: chills, fever Tmax 102, congestion, HA, body aches, nausea, diarrhea No: loss of taste or smell, dyspnea Treatments to date: tylenol and mucinex  Risk factors include: age, crohn's disease on 6MP and budesonide, HTN  COVID vaccination status: [place] x2 05/2020, 06/2020  Recommendation:  Paxlovid full dose.   Lab Results  Component Value Date   CREATININE 0.87 05/23/2020   BUN 10 05/23/2020   NA 143 05/23/2020   K 4.0 05/23/2020   CL 106 05/23/2020   CO2 32 05/23/2020  GFR = 64 (04/2020)  GFR 69 (10/2020) from [facility] records   Drug interactions: amlodipine (change to QOD), budesonide (change to QOD), trazodone (change to QOD).    Your COVID19 PCR test is POSITIVE. Let us know right away if any worsening shortness of breath or persistent productive cough or fever.   Follow these current CDC guidelines for self-isolation: - Stay home for 5 days, starting the day after your symptoms (The first day is really day 0). - If you have no symptoms or your symptoms are resolving after 5 days, you can leave your house. - Continue to wear a mask around others for 5 additional days. **If you have a fever, continue to stay home until your fever resolves for 24 hours without fever-reducing medications.**  Here are some of the supportive therapies that may help with symptoms of a COVID-19 infection: A. Zinc Lozenge 75 to 174m daily B. Vitamin C 5042mtwice a day C. Vitamin D (Cholecalciferol) 2000 IU daily -----------

## 2021-03-04 NOTE — Telephone Encounter (Signed)
Noted  

## 2021-03-05 NOTE — Telephone Encounter (Signed)
Glad she's overall doing better.  Ok to take sertraline, ok to take zyrtec.  Ok to continue OTC plain mucinex with plenty of water to help head congestion.

## 2021-03-05 NOTE — Telephone Encounter (Signed)
Patient notified as instructed by telephone and verbalized understanding. 

## 2021-03-05 NOTE — Telephone Encounter (Signed)
Left message on voicemail for patient to call the office back.

## 2021-03-05 NOTE — Telephone Encounter (Signed)
Spoke to patient by telephone and was advised that she is feeling better, but not at 100%. Patent stated that she has not had any reaction to the medication prescribed. Patient denies, chest congestion, fever or SOB. Patient stated that she has head congestion and would like to know what she can take over the counter for this. Patient stated that she wants to know if  It is okay for her to be taking her sertraline. Patient was advised to rest, drink lots of fluids and eat well balanced meals. Patient was given ER precautions and she verbalized understanding. Patient stated that she is not taking Zyrtec because she was not sure about that either because of all of the contraindications with the anti-viral medication. Pharmacy [facility]

## 2021-04-03 ENCOUNTER — Ambulatory Visit (INDEPENDENT_AMBULATORY_CARE_PROVIDER_SITE_OTHER): Payer: Medicare HMO

## 2021-04-03 ENCOUNTER — Other Ambulatory Visit: Payer: Self-pay

## 2021-04-03 DIAGNOSIS — E538 Deficiency of other specified B group vitamins: Secondary | ICD-10-CM

## 2021-04-03 MED ORDER — CYANOCOBALAMIN 1000 MCG/ML IJ SOLN
1000.0000 ug | Freq: Once | INTRAMUSCULAR | Status: AC
  Administered 2021-04-03: 1000 ug via INTRAMUSCULAR

## 2021-04-03 NOTE — Progress Notes (Signed)
Per orders of Dr. Damita Dunnings, in Dr. Danise Mina' absence, monthly injection of B12, given by Loreen Freud. Patient tolerated injection well.

## 2021-04-14 DIAGNOSIS — R948 Abnormal results of function studies of other organs and systems: Secondary | ICD-10-CM | POA: Diagnosis not present

## 2021-04-14 DIAGNOSIS — K5000 Crohn's disease of small intestine without complications: Secondary | ICD-10-CM | POA: Diagnosis not present

## 2021-04-14 DIAGNOSIS — Z9049 Acquired absence of other specified parts of digestive tract: Secondary | ICD-10-CM | POA: Diagnosis not present

## 2021-04-14 DIAGNOSIS — K219 Gastro-esophageal reflux disease without esophagitis: Secondary | ICD-10-CM | POA: Diagnosis not present

## 2021-04-18 DIAGNOSIS — L989 Disorder of the skin and subcutaneous tissue, unspecified: Secondary | ICD-10-CM | POA: Diagnosis not present

## 2021-04-18 DIAGNOSIS — D225 Melanocytic nevi of trunk: Secondary | ICD-10-CM | POA: Diagnosis not present

## 2021-04-18 DIAGNOSIS — D485 Neoplasm of uncertain behavior of skin: Secondary | ICD-10-CM | POA: Diagnosis not present

## 2021-04-18 DIAGNOSIS — L814 Other melanin hyperpigmentation: Secondary | ICD-10-CM | POA: Diagnosis not present

## 2021-04-18 DIAGNOSIS — L570 Actinic keratosis: Secondary | ICD-10-CM | POA: Diagnosis not present

## 2021-04-18 DIAGNOSIS — L821 Other seborrheic keratosis: Secondary | ICD-10-CM | POA: Diagnosis not present

## 2021-04-25 ENCOUNTER — Ambulatory Visit (INDEPENDENT_AMBULATORY_CARE_PROVIDER_SITE_OTHER): Payer: Medicare HMO | Admitting: Family Medicine

## 2021-04-25 ENCOUNTER — Encounter: Payer: Self-pay | Admitting: Family Medicine

## 2021-04-25 ENCOUNTER — Other Ambulatory Visit: Payer: Self-pay

## 2021-04-25 VITALS — BP 130/70 | HR 75 | Temp 97.7°F | Ht 59.75 in | Wt 115.2 lb

## 2021-04-25 DIAGNOSIS — E538 Deficiency of other specified B group vitamins: Secondary | ICD-10-CM | POA: Diagnosis not present

## 2021-04-25 DIAGNOSIS — K50118 Crohn's disease of large intestine with other complication: Secondary | ICD-10-CM | POA: Diagnosis not present

## 2021-04-25 DIAGNOSIS — U071 COVID-19: Secondary | ICD-10-CM

## 2021-04-25 DIAGNOSIS — F331 Major depressive disorder, recurrent, moderate: Secondary | ICD-10-CM

## 2021-04-25 MED ORDER — FOLIC ACID 800 MCG PO TABS: 800.0000 ug | ORAL_TABLET | Freq: Every day | ORAL | Status: AC

## 2021-04-25 MED ORDER — MELATONIN 10 MG PO TABS: 5.0000 mg | ORAL_TABLET | Freq: Every day | ORAL | Status: DC

## 2021-04-25 NOTE — Progress Notes (Signed)
Patient ID: Michelle Vasquez, female    DOB: 07-04-1951, 70 y.o.   MRN: [id number]  This visit was conducted in person.  BP 130/70   Pulse 75   Temp 97.7 F (36.5 C) (Temporal)   Ht 4' 11.75" (1.518 m)   Wt 115 lb 3 oz (52.2 kg)   SpO2 97%   BMI 22.68 kg/m    CC: 3 mo f/u visit  Subjective:   HPI: Michelle Vasquez is a 70 y.o. female presenting on 04/25/2021 for Follow-up (3 month- mood)   See prior note for details.  Last visit 12/2020, noted worsening anxiety/depression despite trazodone 25-42m nightly for sleep PRN so we started sertraline 234mdaily. Tolerating well without worsening diarrhea or GI upset. Feels this has been helpful - calmer feeling, sleeping better. Feels this is a good dose.   Saw derm as 6-MP increases risk of skin cancer.  Benign spot on nose, had several AKs treated.      Relevant past medical, surgical, family and social history reviewed and updated as indicated. Interim medical history since our last visit reviewed. Allergies and medications reviewed and updated. Outpatient Medications Prior to Visit  Medication Sig Dispense Refill   amLODipine (NORVASC) 5 MG tablet TAKE 1 TABLET BY MOUTH ONCE A DAY 90 tablet 1   budesonide (ENTOCORT EC) 3 MG 24 hr capsule 1 capsule     cetirizine (ZYRTEC) 10 MG tablet TAKE ONE TABLET DAILY AS DIRECTED 100 tablet 3   Cholecalciferol (VITAMIN D) 50 MCG (2000 UT) CAPS Take 1 capsule (2,000 Units total) by mouth daily. 90 capsule 3   cyanocobalamin (,VITAMIN B-12,) 1000 MCG/ML injection INJECT 1 ML EVERY 2 WEEKS AS DIRECTED 234 mL 2   ferrous sulfate (FEROSUL) 325 (65 FE) MG tablet Take 1 tablet (325 mg total) by mouth every Monday, Wednesday, and Friday.     LINZESS 72 MCG capsule TAKE 1 CAPSULE BY MOUTH ONCE DAILY BEFORE BREAKFAST 90 capsule 2   mercaptopurine (PURINETHOL) 50 MG tablet TAKE 1 TABLET BY MOUTH DAILY 1 HOUR BEFORE OR 2 HOURS AFTER MEALS ON AN EMPTY STOMACH. 90 tablet 3   ondansetron (ZOFRAN) 4 MG tablet  Take 1 tablet (4 mg total) by mouth every 8 (eight) hours as needed for nausea or vomiting. 20 tablet 0   pantoprazole (PROTONIX) 40 MG tablet Take 1 tablet by mouth daily.     sertraline (ZOLOFT) 25 MG tablet Take 1 tablet (25 mg total) by mouth daily. 30 tablet 3   Syringe, Disposable, (BD TUBERCULIN SYRINGE) 1 ML MISC 6 syringes for 1 month for B12 injections twice a month 6 each 6   folic acid (FOLVITE) 80841CG tablet Take 400 mcg by mouth daily.     traZODone (DESYREL) 50 MG tablet Take 0.5-1 tablets (25-50 mg total) by mouth at bedtime as needed for sleep. 50 tablet 3   traZODone (DESYREL) 50 MG tablet Take 0.5 tablets (25 mg total) by mouth at bedtime as needed for sleep. 50 tablet    Facility-Administered Medications Prior to Visit  Medication Dose Route Frequency Provider Last Rate Last Admin   cyanocobalamin ((VITAMIN B-12)) injection 1,000 mcg  1,000 mcg Intramuscular Q30 days GuRia BushMD   1,000 mcg at 01/15/21 1544     Per HPI unless specifically indicated in ROS section below Review of Systems  Objective:  BP 130/70   Pulse 75   Temp 97.7 F (36.5 C) (Temporal)   Ht 4' 11.75" (1.518 m)  Wt 115 lb 3 oz (52.2 kg)   SpO2 97%   BMI 22.68 kg/m   Wt Readings from Last 3 Encounters:  04/25/21 115 lb 3 oz (52.2 kg)  01/22/21 118 lb 4 oz (53.6 kg)  11/25/20 117 lb 6 oz (53.2 kg)      Physical Exam Vitals and nursing note reviewed.  Constitutional:      Appearance: Normal appearance. She is not ill-appearing.  Cardiovascular:     Rate and Rhythm: Normal rate and regular rhythm.     Pulses: Normal pulses.     Heart sounds: Normal heart sounds. No murmur heard. Pulmonary:     Effort: Pulmonary effort is normal. No respiratory distress.     Breath sounds: Normal breath sounds. No wheezing, rhonchi or rales.  Musculoskeletal:     Right lower leg: No edema.     Left lower leg: No edema.  Skin:    Findings: Lesion present.     Comments: Multiple healing  erosions throughout skin after recent AK treatment by dermatology   Neurological:     Mental Status: She is alert.  Psychiatric:        Mood and Affect: Mood normal.        Behavior: Behavior normal.      Results for orders placed or performed in visit on 11/21/20  IBC panel  Result Value Ref Range   Iron 191 (H) 42 - 145 ug/dL   Transferrin 335.0 212.0 - 360.0 mg/dL   Saturation Ratios 40.7 20.0 - 50.0 %  Ferritin  Result Value Ref Range   Ferritin 49.8 10.0 - 291.0 ng/mL  Vitamin B12  Result Value Ref Range   Vitamin B-12 143 (L) 211 - 911 pg/mL  CBC with Differential/Platelet  Result Value Ref Range   WBC 6.7 4.0 - 10.5 K/uL   RBC 3.64 (L) 3.87 - 5.11 Mil/uL   Hemoglobin 12.4 12.0 - 15.0 g/dL   HCT 36.6 36.0 - 46.0 %   MCV 100.6 (H) 78.0 - 100.0 fl   MCHC 33.7 30.0 - 36.0 g/dL   RDW 14.4 11.5 - 15.5 %   Platelets 316.0 150.0 - 400.0 K/uL   Neutrophils Relative % 59.9 43.0 - 77.0 %   Lymphocytes Relative 31.5 12.0 - 46.0 %   Monocytes Relative 6.7 3.0 - 12.0 %   Eosinophils Relative 1.4 0.0 - 5.0 %   Basophils Relative 0.5 0.0 - 3.0 %   Neutro Abs 4.0 1.4 - 7.7 K/uL   Lymphs Abs 2.1 0.7 - 4.0 K/uL   Monocytes Absolute 0.4 0.1 - 1.0 K/uL   Eosinophils Absolute 0.1 0.0 - 0.7 K/uL   Basophils Absolute 0.0 0.0 - 0.1 K/uL  Microalbumin / creatinine urine ratio  Result Value Ref Range   Microalb, Ur 0.8 0.0 - 1.9 mg/dL   Creatinine,U 18.2 mg/dL   Microalb Creat Ratio 4.3 0.0 - 30.0 mg/g   Depression screen [facility] 2/9 04/25/2021 01/22/2021 11/22/2020 11/20/2019 11/11/2018  Decreased Interest 2 2 1 1  0  Down, Depressed, Hopeless 0 1 1 1  0  PHQ - 2 Score 2 3 2 2  0  Altered sleeping 0 1 3 0 0  Tired, decreased energy 3 3 0 0 0  Change in appetite 0 1 0 0 0  Feeling bad or failure about yourself  0 0 0 0 0  Trouble concentrating 3 1 0 0 0  Moving slowly or fidgety/restless 0 0 0 0 0  Suicidal thoughts 0 0 0 0  0  PHQ-9 Score 8 9 5 2  0  Difficult doing work/chores - - Not  difficult at all Not difficult at all Not difficult at all  Some recent data might be hidden    GAD 7 : Generalized Anxiety Score 04/25/2021 01/22/2021  Nervous, Anxious, on Edge 2 2  Control/stop worrying 0 1  Worry too much - different things 1 2  Trouble relaxing 2 2  Restless 0 0  Easily annoyed or irritable 0 2  Afraid - awful might happen 2 3  Total GAD 7 Score 7 12   Assessment & Plan:  This visit occurred during the SARS-CoV-2 public health emergency.  Safety protocols were in place, including screening questions prior to the visit, additional usage of staff PPE, and extensive cleaning of exam room while observing appropriate contact time as indicated for disinfecting solutions.   Problem List Items Addressed This Visit     Vitamin B12 deficiency    Update levels undergoing monthly replacement, latest shot was 04/03/2021.        Relevant Orders   Vitamin B12   Crohn's disease ([place])   Relevant Orders   Basic metabolic panel   MDD (major depressive disorder), recurrent episode, moderate ([place]) - Primary    Depression with anxiety.  Chronic, improved with addition of sertraline 38m.  Continue this along with PRN trazodone 238mat night for sleep with melatonin.        Relevant Medications   traZODone (DESYREL) 50 MG tablet   COVID-19 virus infection    Symptoms fully resolved.          Meds ordered this encounter  Medications   Melatonin 10 MG TABS    Sig: Take 5 mg by mouth at bedtime.   folic acid (FOLVITE) 80021CG tablet    Sig: Take 1 tablet (800 mcg total) by mouth daily.   Orders Placed This Encounter  Procedures   Basic metabolic panel   Vitamin B1J15  Patient Instructions  You are doing well. Continue sertraline 2515maily, with trazodone and melatonin as needed for sleep.  Lab today to check b12.  Return as needed or in 6 months for physical.  Follow up plan: Return in about 7 months (around 11/26/2021) for annual exam, prior fasting for blood  work, medicare wellness visit.  JavRia BushD

## 2021-04-25 NOTE — Assessment & Plan Note (Signed)
Update levels undergoing monthly replacement, latest shot was 04/03/2021.

## 2021-04-25 NOTE — Assessment & Plan Note (Signed)
Symptoms fully resolved.

## 2021-04-25 NOTE — Assessment & Plan Note (Signed)
Depression with anxiety.  Chronic, improved with addition of sertraline 79m.  Continue this along with PRN trazodone 262mat night for sleep with melatonin.

## 2021-04-25 NOTE — Patient Instructions (Addendum)
You are doing well. Continue sertraline 12m daily, with trazodone and melatonin as needed for sleep.  Lab today to check b12.  Return as needed or in 6 months for physical.

## 2021-04-26 ENCOUNTER — Encounter: Payer: Self-pay | Admitting: Family Medicine

## 2021-04-26 LAB — BASIC METABOLIC PANEL
BUN: 12 mg/dL (ref 7–25)
CO2: 27 mmol/L (ref 20–32)
Calcium: 9.6 mg/dL (ref 8.6–10.4)
Chloride: 104 mmol/L (ref 98–110)
Creat: 0.89 mg/dL (ref 0.60–0.93)
Glucose, Bld: 91 mg/dL (ref 65–99)
Potassium: 4.9 mmol/L (ref 3.5–5.3)
Sodium: 142 mmol/L (ref 135–146)

## 2021-04-26 LAB — VITAMIN B12: Vitamin B-12: 376 pg/mL (ref 200–1100)

## 2021-04-27 MED ORDER — FLUOXETINE HCL 10 MG PO CAPS: 10.0000 mg | ORAL_CAPSULE | Freq: Every day | ORAL | 6 refills | Status: DC

## 2021-04-27 NOTE — Addendum Note (Signed)
Addended by: [name] on: 04/27/2021 12:29 PM   Modules accepted: Orders

## 2021-05-20 ENCOUNTER — Ambulatory Visit (INDEPENDENT_AMBULATORY_CARE_PROVIDER_SITE_OTHER): Payer: Medicare HMO | Admitting: *Deleted

## 2021-05-20 ENCOUNTER — Other Ambulatory Visit: Payer: Self-pay

## 2021-05-20 DIAGNOSIS — E538 Deficiency of other specified B group vitamins: Secondary | ICD-10-CM

## 2021-05-20 MED ORDER — CYANOCOBALAMIN 1000 MCG/ML IJ SOLN
1000.0000 ug | Freq: Once | INTRAMUSCULAR | Status: AC
  Administered 2021-05-20: 1000 ug via INTRAMUSCULAR

## 2021-05-20 NOTE — Progress Notes (Signed)
Per orders of Dr. Danise Mina, injection of Vitamin B12 given in Left Deltoid by Lauralyn Primes. Patient tolerated injection well.

## 2021-06-06 ENCOUNTER — Other Ambulatory Visit: Payer: Self-pay | Admitting: Family Medicine

## 2021-07-14 DIAGNOSIS — R899 Unspecified abnormal finding in specimens from other organs, systems and tissues: Secondary | ICD-10-CM | POA: Diagnosis not present

## 2021-07-18 DIAGNOSIS — R899 Unspecified abnormal finding in specimens from other organs, systems and tissues: Secondary | ICD-10-CM | POA: Diagnosis not present

## 2021-08-08 DIAGNOSIS — R899 Unspecified abnormal finding in specimens from other organs, systems and tissues: Secondary | ICD-10-CM | POA: Diagnosis not present

## 2021-08-29 ENCOUNTER — Other Ambulatory Visit: Payer: Self-pay | Admitting: Family Medicine

## 2021-09-23 DIAGNOSIS — R948 Abnormal results of function studies of other organs and systems: Secondary | ICD-10-CM | POA: Diagnosis not present

## 2021-09-23 DIAGNOSIS — K219 Gastro-esophageal reflux disease without esophagitis: Secondary | ICD-10-CM | POA: Diagnosis not present

## 2021-09-23 DIAGNOSIS — E538 Deficiency of other specified B group vitamins: Secondary | ICD-10-CM | POA: Diagnosis not present

## 2021-09-23 DIAGNOSIS — K5000 Crohn's disease of small intestine without complications: Secondary | ICD-10-CM | POA: Diagnosis not present

## 2021-09-23 DIAGNOSIS — Z9049 Acquired absence of other specified parts of digestive tract: Secondary | ICD-10-CM | POA: Diagnosis not present

## 2021-09-23 DIAGNOSIS — D649 Anemia, unspecified: Secondary | ICD-10-CM | POA: Diagnosis not present

## 2021-10-07 DIAGNOSIS — E876 Hypokalemia: Secondary | ICD-10-CM | POA: Diagnosis not present

## 2021-11-07 DIAGNOSIS — K5000 Crohn's disease of small intestine without complications: Secondary | ICD-10-CM | POA: Diagnosis not present

## 2021-11-13 ENCOUNTER — Other Ambulatory Visit: Payer: Self-pay | Admitting: Family Medicine

## 2021-11-13 DIAGNOSIS — D508 Other iron deficiency anemias: Secondary | ICD-10-CM

## 2021-11-13 DIAGNOSIS — I10 Essential (primary) hypertension: Secondary | ICD-10-CM

## 2021-11-13 DIAGNOSIS — K50118 Crohn's disease of large intestine with other complication: Secondary | ICD-10-CM

## 2021-11-13 DIAGNOSIS — M8588 Other specified disorders of bone density and structure, other site: Secondary | ICD-10-CM

## 2021-11-13 DIAGNOSIS — E538 Deficiency of other specified B group vitamins: Secondary | ICD-10-CM

## 2021-11-19 ENCOUNTER — Other Ambulatory Visit: Payer: Medicare HMO

## 2021-11-24 ENCOUNTER — Ambulatory Visit: Payer: Medicare HMO

## 2021-11-26 ENCOUNTER — Encounter: Payer: Medicare HMO | Admitting: Family Medicine

## 2021-12-19 DIAGNOSIS — D649 Anemia, unspecified: Secondary | ICD-10-CM | POA: Diagnosis not present

## 2022-01-25 ENCOUNTER — Encounter: Payer: Self-pay | Admitting: Emergency Medicine

## 2022-01-25 ENCOUNTER — Other Ambulatory Visit: Payer: Self-pay

## 2022-01-25 ENCOUNTER — Emergency Department
Admission: EM | Admit: 2022-01-25 | Discharge: 2022-01-25 | Disposition: A | Discharge status: Home / Self Care | Payer: Medicare HMO | Attending: Emergency Medicine | Admitting: Emergency Medicine

## 2022-01-25 ENCOUNTER — Emergency Department: Payer: Medicare HMO

## 2022-01-25 DIAGNOSIS — S51812A Laceration without foreign body of left forearm, initial encounter: Secondary | ICD-10-CM | POA: Insufficient documentation

## 2022-01-25 DIAGNOSIS — W228XXA Striking against or struck by other objects, initial encounter: Secondary | ICD-10-CM | POA: Insufficient documentation

## 2022-01-25 DIAGNOSIS — S59912A Unspecified injury of left forearm, initial encounter: Secondary | ICD-10-CM | POA: Diagnosis present

## 2022-01-25 DIAGNOSIS — I10 Essential (primary) hypertension: Secondary | ICD-10-CM | POA: Diagnosis not present

## 2022-01-25 MED ORDER — BACITRACIN-NEOMYCIN-POLYMYXIN 400-5-5000 EX OINT
TOPICAL_OINTMENT | Freq: Once | CUTANEOUS | Status: AC
  Administered 2022-01-25: 1 via TOPICAL
  Filled 2022-01-25: qty 1

## 2022-01-25 NOTE — ED Triage Notes (Signed)
Pt via POV from home. Pt accidentally got her L arm slammed in the storm door. Pt has a laceration to the L forearm. Bleeding controlled. Pt is A&Ox4 and NAD.  ?

## 2022-01-25 NOTE — Discharge Instructions (Addendum)
-  Apply topical antibiotic ointment to the site daily.  Wrap with nonstick dressing.  Change dressings daily ?-Follow-up with your primary care provider in 7 days for wound check as needed. ?-Return to the emergency department anytime if you begin to experience any new or worsening symptoms. ?

## 2022-01-25 NOTE — ED Provider Notes (Signed)
? ?[facility] ?Provider Note ? ? ? Event Date/Time  ? First MD Initiated Contact with Patient 01/25/22 1150   ?  (approximate) ? ? ?History  ? ?Chief Complaint ?Laceration ? ? ?HPI ?Michelle Vasquez is a 71 y.o. female, history of Crohn's disease, polyarthritis, MDD, hypertension, presents the emergency department for evaluation of left arm injury.  Patient states that she was letting her dog outside when her dog accidentally pulled her arm at the same time the storm door was closing, causing it to hit her left forearm.  She states that she is concerned about possible fracture underneath.  Additionally, she has a skin tear along the forearm as well.  Denies head injury, LOC, nausea/vomiting, neck pain, elbow pain, shoulder pain, upper arm pain, numbness/tingling in upper extremities, abdominal pain, lightheadedness/dizziness, or fever/chills. ? ?History Limitations: No limitations. ? ?  ? ? ?Physical Exam  ?Triage Vital Signs: ?ED Triage Vitals  ?Enc Vitals Group  ?   BP 01/25/22 1146 (!) 170/77  ?   Pulse Rate 01/25/22 1146 79  ?   Resp 01/25/22 1146 18  ?   Temp 01/25/22 1146 98.5 ?F (36.9 ?C)  ?   Temp Source 01/25/22 1146 Oral  ?   SpO2 01/25/22 1146 96 %  ?   Weight 01/25/22 1148 110 lb (49.9 kg)  ?   Height 01/25/22 1148 5' (1.524 m)  ?   Head Circumference --   ?   Peak Flow --   ?   Pain Score 01/25/22 1148 4  ?   Pain Loc --   ?   Pain Edu? --   ?   Excl. in [place]? --   ? ? ?Most recent vital signs: ?Vitals:  ? 01/25/22 1146  ?BP: (!) 170/77  ?Pulse: 79  ?Resp: 18  ?Temp: 98.5 ?F (36.9 ?C)  ?SpO2: 96%  ? ? ?General: Awake, NAD.  ?Skin: Warm, dry.  ?CV: Good peripheral perfusion.  ?Resp: Normal effort.  ?Abd: Soft, non-tender. No distention.  ?Neuro: At baseline. No gross neurological deficits.  ?Other: Quarter sized superficial skin tear appreciated along the posterior aspect of the left forearm, no active bleeding or discharge.  Not amenable to suture repair.  No surrounding warmth or  erythema.  Mild bony tenderness underlying the site of the injury.  Normal range of motion of the upper extremity.  Pulse, motor, sensation intact distally.  No other injuries present. ? ?Physical Exam ? ? ? ?ED Results / Procedures / Treatments  ?Labs ?(all labs ordered are listed, but only abnormal results are displayed) ?Labs Reviewed - No data to display ? ? ?EKG ?Not applicable. ? ? ?RADIOLOGY ? ?ED Provider Interpretation: I personally reviewed and interpreted this x-ray.  No evidence of fracture or dislocation. ? ?DG Forearm Left ? ?Result Date: 01/25/2022 ?CLINICAL DATA:  Injury to left forearm. Left forearm slammed in the strong door. Laceration to left mid forearm. EXAM: LEFT FOREARM - 2 VIEW COMPARISON:  None. FINDINGS: There is no evidence of fracture or other focal bone lesions. Soft tissues are unremarkable. IMPRESSION: No evidence of fracture or dislocation. Electronically Signed   By: Keane Police D.O.   On: 01/25/2022 12:28   ? ?PROCEDURES: ? ?Critical Care performed: None. ? ?Procedures ? ? ? ?MEDICATIONS ORDERED IN ED: ?Medications  ?neomycin-bacitracin-polymyxin (NEOSPORIN) ointment packet (1 application. Topical Given 01/25/22 1253)  ? ? ? ?IMPRESSION / MDM / ASSESSMENT AND PLAN / ED COURSE  ?I reviewed the triage  vital signs and the nursing notes. ?             ?               ? ? ?Differential diagnosis includes, but is not limited to, radius fracture, ulnar fracture, forearm sprain, skin tear/laceration. ? ?ED Course ?Patient appears well.  Vital signs within normal limits for the patient.  NAD.  She states that she does not want any pain medication at this time. ? ?Forearm x-ray shows no evidence of acute fracture or dislocation. ? ?Assessment/Plan ?Presentation consistent with skin tear of the left forearm.  It is not amenable to suture or Dermabond repair.  Applied bacitracin to the wound site and wrapped with nonstick gauze.  Provided patient with instructions for care at home.  X-ray shows  no evidence of underlying fractures.  Low suspicion for any occult fractures requiring advanced imaging.  We will plan to discharge this patient.  Advised her to follow-up with her primary care provider for wound check.  Patient expressed understanding and agreed with the plan. ? ?Patient was provided with anticipatory guidance, return precautions, and educational material. Encouraged the patient to return to the emergency department at any time if they begin to experience any new or worsening symptoms.  ? ?  ? ? ?FINAL CLINICAL IMPRESSION(S) / ED DIAGNOSES  ? ?Final diagnoses:  ?Skin tear of forearm without complication, left, initial encounter  ? ? ? ?Rx / DC Orders  ? ?ED Discharge Orders   ? ? None  ? ?  ? ? ? ?Note:  This document was prepared using Dragon voice recognition software and may include unintentional dictation errors. ?  ?Teodoro Spray, [place] ?01/26/22 8088 ? ?  ?Rada Hay, MD ?01/26/22 1622 ? ?

## 2022-01-25 NOTE — ED Notes (Signed)
See triage note  presents with injury to left forearm  states she hit it with a storm door   ?

## 2022-03-09 ENCOUNTER — Other Ambulatory Visit: Payer: Self-pay | Admitting: Family Medicine

## 2022-03-10 ENCOUNTER — Telehealth: Payer: Self-pay

## 2022-03-10 NOTE — Telephone Encounter (Signed)
Lvm asking pt to call back.  Pt has awv/cpe on 03/27/22.  Needs cpe lab visit scheduled next week.  ?

## 2022-03-11 NOTE — Telephone Encounter (Signed)
Lvm asking pt to call back.  Pt has awv/cpe on 03/27/22.  Needs cpe lab visit scheduled next week.  ?

## 2022-03-12 NOTE — Telephone Encounter (Signed)
Lvm asking pt to call back.  Pt has awv/cpe on 03/27/22.  Needs cpe lab visit scheduled next week.

## 2022-03-13 NOTE — Telephone Encounter (Signed)
Attempted several times to contact pt with no response.  Closing phn note.

## 2022-03-13 NOTE — Telephone Encounter (Signed)
Patient advised and lab appointment made for 03/20/22

## 2022-03-20 ENCOUNTER — Other Ambulatory Visit (INDEPENDENT_AMBULATORY_CARE_PROVIDER_SITE_OTHER): Payer: Medicare HMO

## 2022-03-20 DIAGNOSIS — E538 Deficiency of other specified B group vitamins: Secondary | ICD-10-CM | POA: Diagnosis not present

## 2022-03-20 DIAGNOSIS — M8588 Other specified disorders of bone density and structure, other site: Secondary | ICD-10-CM | POA: Diagnosis not present

## 2022-03-20 DIAGNOSIS — I10 Essential (primary) hypertension: Secondary | ICD-10-CM | POA: Diagnosis not present

## 2022-03-20 DIAGNOSIS — K50118 Crohn's disease of large intestine with other complication: Secondary | ICD-10-CM | POA: Diagnosis not present

## 2022-03-20 DIAGNOSIS — D508 Other iron deficiency anemias: Secondary | ICD-10-CM

## 2022-03-20 LAB — MICROALBUMIN / CREATININE URINE RATIO
Creatinine,U: 143.6 mg/dL
Microalb Creat Ratio: 3.5 mg/g (ref 0.0–30.0)
Microalb, Ur: 5 mg/dL — ABNORMAL HIGH (ref 0.0–1.9)

## 2022-03-20 LAB — COMPREHENSIVE METABOLIC PANEL
ALT: 29 U/L (ref 0–35)
AST: 29 U/L (ref 0–37)
Albumin: 4.2 g/dL (ref 3.5–5.2)
Alkaline Phosphatase: 54 U/L (ref 39–117)
BUN: 12 mg/dL (ref 6–23)
CO2: 32 mEq/L (ref 19–32)
Calcium: 9.2 mg/dL (ref 8.4–10.5)
Chloride: 100 mEq/L (ref 96–112)
Creatinine, Ser: 0.9 mg/dL (ref 0.40–1.20)
GFR: 64.29 mL/min (ref >60.00)
Glucose, Bld: 126 mg/dL — ABNORMAL HIGH (ref 70–99)
Potassium: 3.1 mEq/L — ABNORMAL LOW (ref 3.5–5.1)
Sodium: 143 mEq/L (ref 135–145)
Total Bilirubin: 1 mg/dL (ref 0.2–1.2)
Total Protein: 7 g/dL (ref 6.0–8.3)

## 2022-03-20 LAB — CBC WITH DIFFERENTIAL/PLATELET
Basophils Absolute: 0 10*3/uL (ref 0.0–0.1)
Basophils Relative: 0.6 % (ref 0.0–3.0)
Eosinophils Absolute: 0 10*3/uL (ref 0.0–0.7)
Eosinophils Relative: 0.3 % (ref 0.0–5.0)
HCT: 31.1 % — ABNORMAL LOW (ref 36.0–46.0)
Hemoglobin: 10 g/dL — ABNORMAL LOW (ref 12.0–15.0)
Lymphocytes Relative: 20 % (ref 12.0–46.0)
Lymphs Abs: 1.6 10*3/uL (ref 0.7–4.0)
MCHC: 32.2 g/dL (ref 30.0–36.0)
MCV: 91.9 fl (ref 78.0–100.0)
Monocytes Absolute: 0.4 10*3/uL (ref 0.1–1.0)
Monocytes Relative: 4.6 % (ref 3.0–12.0)
Neutro Abs: 5.9 10*3/uL (ref 1.4–7.7)
Neutrophils Relative %: 74.5 % (ref 43.0–77.0)
Platelets: 394 10*3/uL (ref 150.0–400.0)
RBC: 3.38 Mil/uL — ABNORMAL LOW (ref 3.87–5.11)
RDW: 17.3 % — ABNORMAL HIGH (ref 11.5–15.5)
WBC: 7.9 10*3/uL (ref 4.0–10.5)

## 2022-03-20 LAB — FERRITIN: Ferritin: 6.3 ng/mL — ABNORMAL LOW (ref 10.0–291.0)

## 2022-03-20 LAB — LIPID PANEL
Cholesterol: 192 mg/dL (ref 0–200)
HDL: 96.9 mg/dL (ref >39.00)
LDL Cholesterol: 82 mg/dL (ref 0–99)
NonHDL: 95.38
Total CHOL/HDL Ratio: 2
Triglycerides: 68 mg/dL (ref 0.0–149.0)
VLDL: 13.6 mg/dL (ref 0.0–40.0)

## 2022-03-20 LAB — VITAMIN D 25 HYDROXY (VIT D DEFICIENCY, FRACTURES): VITD: 22.55 ng/mL — ABNORMAL LOW (ref 30.00–100.00)

## 2022-03-20 LAB — TSH: TSH: 2.13 u[IU]/mL (ref 0.35–5.50)

## 2022-03-20 LAB — IBC PANEL
Iron: 44 ug/dL (ref 42–145)
Saturation Ratios: 7.7 % — ABNORMAL LOW (ref 20.0–50.0)
TIBC: 571.2 ug/dL — ABNORMAL HIGH (ref 250.0–450.0)
Transferrin: 408 mg/dL — ABNORMAL HIGH (ref 212.0–360.0)

## 2022-03-20 LAB — VITAMIN B12: Vitamin B-12: 143 pg/mL — ABNORMAL LOW (ref 211–911)

## 2022-03-27 ENCOUNTER — Ambulatory Visit (INDEPENDENT_AMBULATORY_CARE_PROVIDER_SITE_OTHER): Payer: Medicare HMO | Admitting: Family Medicine

## 2022-03-27 ENCOUNTER — Encounter: Payer: Self-pay | Admitting: Family Medicine

## 2022-03-27 VITALS — BP 132/70 | HR 81 | Temp 97.6°F | Ht 59.0 in | Wt 112.5 lb

## 2022-03-27 DIAGNOSIS — E538 Deficiency of other specified B group vitamins: Secondary | ICD-10-CM

## 2022-03-27 DIAGNOSIS — F331 Major depressive disorder, recurrent, moderate: Secondary | ICD-10-CM | POA: Diagnosis not present

## 2022-03-27 DIAGNOSIS — E876 Hypokalemia: Secondary | ICD-10-CM | POA: Insufficient documentation

## 2022-03-27 DIAGNOSIS — Z9049 Acquired absence of other specified parts of digestive tract: Secondary | ICD-10-CM | POA: Diagnosis not present

## 2022-03-27 DIAGNOSIS — Z0000 Encounter for general adult medical examination without abnormal findings: Secondary | ICD-10-CM | POA: Diagnosis not present

## 2022-03-27 DIAGNOSIS — I10 Essential (primary) hypertension: Secondary | ICD-10-CM

## 2022-03-27 DIAGNOSIS — D508 Other iron deficiency anemias: Secondary | ICD-10-CM

## 2022-03-27 DIAGNOSIS — R011 Cardiac murmur, unspecified: Secondary | ICD-10-CM

## 2022-03-27 DIAGNOSIS — E559 Vitamin D deficiency, unspecified: Secondary | ICD-10-CM

## 2022-03-27 DIAGNOSIS — K50118 Crohn's disease of large intestine with other complication: Secondary | ICD-10-CM | POA: Diagnosis not present

## 2022-03-27 DIAGNOSIS — M8588 Other specified disorders of bone density and structure, other site: Secondary | ICD-10-CM | POA: Diagnosis not present

## 2022-03-27 DIAGNOSIS — Z7189 Other specified counseling: Secondary | ICD-10-CM

## 2022-03-27 DIAGNOSIS — Z0001 Encounter for general adult medical examination with abnormal findings: Secondary | ICD-10-CM

## 2022-03-27 MED ORDER — POTASSIUM CHLORIDE CRYS ER 10 MEQ PO TBCR: 20.0000 meq | EXTENDED_RELEASE_TABLET | Freq: Every day | ORAL | 0 refills | Status: DC

## 2022-03-27 MED ORDER — POTASSIUM 99 MG PO TABS: 1.0000 | ORAL_TABLET | Freq: Every day | ORAL | Status: DC

## 2022-03-27 MED ORDER — CYANOCOBALAMIN 1000 MCG/ML IJ SOLN
1000.0000 ug | Freq: Once | INTRAMUSCULAR | Status: AC
  Administered 2022-03-27: 1000 ug via INTRAMUSCULAR

## 2022-03-27 NOTE — Assessment & Plan Note (Addendum)
Preventative protocols reviewed and updated unless pt declined. Discussed healthy diet and lifestyle. Encouraged updating mammogram, she declines.

## 2022-03-27 NOTE — Assessment & Plan Note (Addendum)
Persistent iron deficiency anemia, oral iron limited due to GI intolerance. She does endorse ongoing fatigue, but no dyspnea or dizziness or other anemic symptoms. Will order iron infusion through [facility] a short stay center. She prefers Feraheme 2 infusions versus Venofer 5 infusions due to work schedule.

## 2022-03-27 NOTE — Assessment & Plan Note (Signed)
Stable off of Prozac.  Will remain off of antidepressant at this time.

## 2022-03-27 NOTE — Assessment & Plan Note (Signed)
Overall stable period on budesonide and mercaptopurine-appreciate [facility].

## 2022-03-27 NOTE — Patient Instructions (Addendum)
Consider updated bone density scan next year.  Consider starting calcium + vitamin D supplement daily.  If interested, check with pharmacy about new 2 shot shingles series (shingrix).  Restart weekly vitamin B12 shots for 4 weeks then start monthly b12 shots - first dose today.  Vitamin D was also low - take daily 2000 units, plus cal/vit D.  Take prescription potassium 67mq daily for 5 days then start over the counter potassium 949mdaily.  We will set you up for iron infusion. Will try 2 dose one if not covered will do 5 dose one.  Ok to stay off prozac.  Return in 6 months for follow up visit.   Health Maintenance After Age 7272fter age 7490you are at a higher risk for certain long-term diseases and infections as well as injuries from falls. Falls are a major cause of broken bones and head injuries in people who are older than age 7272Getting regular preventive care can help to keep you healthy and well. Preventive care includes getting regular testing and making lifestyle changes as recommended by your health care provider. Talk with your health care provider about: Which screenings and tests you should have. A screening is a test that checks for a disease when you have no symptoms. A diet and exercise plan that is right for you. What should I know about screenings and tests to prevent falls? Screening and testing are the best ways to find a health problem early. Early diagnosis and treatment give you the best chance of managing medical conditions that are common after age 7272Certain conditions and lifestyle choices may make you more likely to have a fall. Your health care provider may recommend: Regular vision checks. Poor vision and conditions such as cataracts can make you more likely to have a fall. If you wear glasses, make sure to get your prescription updated if your vision changes. Medicine review. Work with your health care provider to regularly review all of the medicines you are  taking, including over-the-counter medicines. Ask your health care provider about any side effects that may make you more likely to have a fall. Tell your health care provider if any medicines that you take make you feel dizzy or sleepy. Strength and balance checks. Your health care provider may recommend certain tests to check your strength and balance while standing, walking, or changing positions. Foot health exam. Foot pain and numbness, as well as not wearing proper footwear, can make you more likely to have a fall. Screenings, including: Osteoporosis screening. Osteoporosis is a condition that causes the bones to get weaker and break more easily. Blood pressure screening. Blood pressure changes and medicines to control blood pressure can make you feel dizzy. Depression screening. You may be more likely to have a fall if you have a fear of falling, feel depressed, or feel unable to do activities that you used to do. Alcohol use screening. Using too much alcohol can affect your balance and may make you more likely to have a fall. Follow these instructions at home: Lifestyle Do not drink alcohol if: Your health care provider tells you not to drink. If you drink alcohol: Limit how much you have to: 0-1 drink a day for women. 0-2 drinks a day for men. Know how much alcohol is in your drink. In the [place] one drink equals one 12 oz bottle of beer (355 mL), one 5 oz glass of wine (148 mL), or one 1 oz glass of hard liquor (  44 mL). Do not use any products that contain nicotine or tobacco. These products include cigarettes, chewing tobacco, and vaping devices, such as e-cigarettes. If you need help quitting, ask your health care provider. Activity  Follow a regular exercise program to stay fit. This will help you maintain your balance. Ask your health care provider what types of exercise are appropriate for you. If you need a cane or walker, use it as recommended by your health care provider. Wear  supportive shoes that have nonskid soles. Safety  Remove any tripping hazards, such as rugs, cords, and clutter. Install safety equipment such as grab bars in bathrooms and safety rails on stairs. Keep rooms and walkways well-lit. General instructions Talk with your health care provider about your risks for falling. Tell your health care provider if: You fall. Be sure to tell your health care provider about all falls, even ones that seem minor. You feel dizzy, tiredness (fatigue), or off-balance. Take over-the-counter and prescription medicines only as told by your health care provider. These include supplements. Eat a healthy diet and maintain a healthy weight. A healthy diet includes low-fat dairy products, low-fat (lean) meats, and fiber from whole grains, beans, and lots of fruits and vegetables. Stay current with your vaccines. Schedule regular health, dental, and eye exams. Summary Having a healthy lifestyle and getting preventive care can help to protect your health and wellness after age 72. Screening and testing are the best way to find a health problem early and help you avoid having a fall. Early diagnosis and treatment give you the best chance for managing medical conditions that are more common for people who are older than age 72. Falls are a major cause of broken bones and head injuries in people who are older than age 72. Take precautions to prevent a fall at home. Work with your health care provider to learn what changes you can make to improve your health and wellness and to prevent falls. This information is not intended to replace advice given to you by your health care provider. Make sure you discuss any questions you have with your health care provider. Document Revised: 03/03/2021 Document Reviewed: 03/03/2021 Elsevier Patient Education  [employer] [employer].

## 2022-03-27 NOTE — Assessment & Plan Note (Signed)
Recommend start potassium 20 mill equivalent tablets daily x5 days, then start over-the-counter potassium 99 mg daily

## 2022-03-27 NOTE — Assessment & Plan Note (Addendum)

## 2022-03-27 NOTE — Assessment & Plan Note (Signed)
Levels low only on oral replacement. We will start weekly B12 shots for 4 weeks then changed to monthly, likely continue indefinitely.  She desires to receive injections through our office.  First dose today

## 2022-03-27 NOTE — Progress Notes (Signed)
Patient ID: Michelle Vasquez, female    DOB: 1951/03/05, 71 y.o.   MRN: [id number]  This visit was conducted in person.  BP 132/70   Pulse 81   Temp 97.6 F (36.4 C) (Temporal)   Ht 4' 11"  (1.499 m)   Wt 112 lb 8 oz (51 kg)   SpO2 97%   BMI 22.72 kg/m    CC: CPE Subjective:   HPI: Michelle Vasquez is a 71 y.o. female presenting on 03/27/2022 for Medicare Wellness   Did not see health advisor.   Hearing Screening   500Hz  1000Hz  2000Hz  4000Hz   Right ear 20 25 40 40  Left ear 20 25 0 0   Vision Screening   Right eye Left eye Both eyes  Without correction     With correction 20/25 20/20 20/20    Endorses h/o R ear hearing loss.  [facility] Office Visit from 03/27/2022 in [place] at [facility]  PHQ-2 Total Score 0          03/27/2022    3:30 PM 11/22/2020   10:40 AM 11/20/2019   11:16 AM 11/11/2018   10:22 AM  Fall Risk   Falls in the past year? 0 0 0 0  Number falls in past yr:  0 0   Injury with Fall?  0 0   Risk for fall due to :  Medication side effect Medication side effect   Follow up  Falls evaluation completed;Falls prevention discussed Falls prevention discussed;Falls evaluation completed     Anxiety/depression - stable period off prozac 67m daily. Sertraline caused worsening GI symptoms.   Crohn's - sees GI Dr BAlessandra Bevels Planned colonoscopy next year.   Notes [place] and craving to iceberg lettuce.   Preventative: COLONOSCOPY Date: 02/2015 benign biopsies, crohn's with stenotic stricture at anastomosis (Olevia Perches.  COLONOSCOPY 12/2019 - small adenomas, crohn's disease small intestine, diverticulosis, non-patent end to side ileocolonic anastomosis with inflammation and ulceration biopsied (no dysplasia), rpt 3 yrs (Brahmbhatt).  Well woman - s/p total hysterectomy for fibroids, ovaries removed at age 71yo Took hormone replacement only for a few months, stopped early but unclear reason.  Mammogram Birads1 11/2019 @ [place]. Declines repeat. She  does breast exams at home. No fmhx breast cancer.  DEXA - spine -1.7, hip -1.6 02/2018. Discussed calcium in diet (limited due to crohn's), calcium supplement, and vitamin D supplement, and regular weight bearing exercise.  Lung cancer screening - not eligible  Flu shot yearly CDixon8/2021, 06/2020, no booster  Tdap 2005, 2015 Pneumovax 2014. Prevnar-13 2019. Pneumovax 10/2019 zostavax - 2017 Shingrix - discussed.  Completed Hep B series.  PPD at work yearly. Advanced directive - completed at home. Wants daughter to be HCPOA asked to [place].  Seat belt use discussed.  Sunscreen use discussed. No suspicious moles. Referred to derm by GI due to medications.  Ex smoker quit remotely - but daughter smokes outdoors Alcohol - none  Dentist q6 mo due Eye exam yearly due Bowel - h/o crohn's with loose stools, no constipation  Bladder - no incontinence   Caffeine: 2 cups coffee/day   Divorced 1989   3 children   Occ: MSurveyor, mining working at [employer] Activity: walks dog 1 mi 4-5 times a week.  Diet: good water, fruits/vegetables ok      Relevant past medical, surgical, family and social history reviewed and updated as indicated. Interim medical history since our last visit reviewed. Allergies  and medications reviewed and updated. Outpatient Medications Prior to Visit  Medication Sig Dispense Refill   amLODipine (NORVASC) 5 MG tablet TAKE 1 TABLET BY MOUTH ONCE A DAY 90 tablet 0   budesonide (ENTOCORT EC) 3 MG 24 hr capsule 1 capsule     cetirizine (ZYRTEC) 10 MG tablet TAKE ONE TABLET DAILY AS DIRECTED 100 tablet 3   Cholecalciferol (VITAMIN D) 50 MCG (2000 UT) CAPS Take 1 capsule (2,000 Units total) by mouth daily. 90 capsule 3   cyanocobalamin (,VITAMIN B-12,) 1000 MCG/ML injection INJECT 1 ML EVERY 2 WEEKS AS DIRECTED 234 mL 2   ferrous sulfate (FEROSUL) 325 (65 FE) MG tablet Take 1 tablet (325 mg total) by mouth every Monday,  Wednesday, and Friday.     folic acid (FOLVITE) 381 MCG tablet Take 1 tablet (800 mcg total) by mouth daily.     LINZESS 72 MCG capsule TAKE 1 CAPSULE BY MOUTH ONCE DAILY BEFORE BREAKFAST (Patient taking differently: As needed) 90 capsule 2   Melatonin 10 MG TABS Take 5 mg by mouth at bedtime.     mercaptopurine (PURINETHOL) 50 MG tablet TAKE 1 TABLET BY MOUTH DAILY 1 HOUR BEFORE OR 2 HOURS AFTER MEALS ON AN EMPTY STOMACH. 90 tablet 3   pantoprazole (PROTONIX) 40 MG tablet Take 1 tablet by mouth daily.     FLUoxetine (PROZAC) 10 MG capsule Take 1 capsule (10 mg total) by mouth daily. 30 capsule 6   ondansetron (ZOFRAN) 4 MG tablet Take 1 tablet (4 mg total) by mouth every 8 (eight) hours as needed for nausea or vomiting. 20 tablet 0   Syringe, Disposable, (BD TUBERCULIN SYRINGE) 1 ML MISC 6 syringes for 1 month for B12 injections twice a month 6 each 6   traZODone (DESYREL) 50 MG tablet Take 0.5 tablets (25 mg total) by mouth at bedtime as needed for sleep. 50 tablet    Facility-Administered Medications Prior to Visit  Medication Dose Route Frequency Provider Last Rate Last Admin   cyanocobalamin ((VITAMIN B-12)) injection 1,000 mcg  1,000 mcg Intramuscular Q30 days [name], MD   1,000 mcg at 01/15/21 1544     Per HPI unless specifically indicated in ROS section below Review of Systems  Constitutional:  Positive for fatigue. Negative for activity change, appetite change, chills, fever and unexpected weight change.  HENT:  Negative for hearing loss.   Eyes:  Negative for visual disturbance.  Respiratory:  Negative for cough, chest tightness, shortness of breath and wheezing.   Cardiovascular:  Negative for chest pain, palpitations and leg swelling.  Gastrointestinal:  Negative for abdominal distention, abdominal pain, blood in stool, constipation, diarrhea, nausea and vomiting.  Genitourinary:  Negative for difficulty urinating and hematuria.  Musculoskeletal:  Negative for  arthralgias, myalgias and neck pain.  Skin:  Negative for rash.  Neurological:  Negative for dizziness, seizures, syncope and headaches.  Hematological:  Negative for adenopathy. Bruises/bleeds easily.  Psychiatric/Behavioral:  Negative for dysphoric mood. The patient is not nervous/anxious.    Objective:  BP 132/70   Pulse 81   Temp 97.6 F (36.4 C) (Temporal)   Ht 4' 11"  (1.499 m)   Wt 112 lb 8 oz (51 kg)   SpO2 97%   BMI 22.72 kg/m   Wt Readings from Last 3 Encounters:  03/27/22 112 lb 8 oz (51 kg)  01/25/22 110 lb (49.9 kg)  04/25/21 115 lb 3 oz (52.2 kg)      Physical Exam Vitals and nursing note reviewed.  Constitutional:      Appearance: Normal appearance. She is not ill-appearing.  HENT:     Head: Normocephalic and atraumatic.     Right Ear: Tympanic membrane, ear canal and external ear normal. There is no impacted cerumen.     Left Ear: Tympanic membrane, ear canal and external ear normal. There is no impacted cerumen.  Eyes:     General:        Right eye: No discharge.        Left eye: No discharge.     Extraocular Movements: Extraocular movements intact.     Conjunctiva/sclera: Conjunctivae normal.     Pupils: Pupils are equal, round, and reactive to light.  Neck:     Thyroid: No thyroid mass or thyromegaly.     Vascular: No carotid bruit.  Cardiovascular:     Rate and Rhythm: Normal rate and regular rhythm.     Pulses: Normal pulses.     Heart sounds: Murmur (2/6 mild systolic USB) heard.  Pulmonary:     Effort: Pulmonary effort is normal. No respiratory distress.     Breath sounds: Normal breath sounds. No wheezing, rhonchi or rales.  Abdominal:     General: Bowel sounds are normal. There is no distension.     Palpations: Abdomen is soft. There is no mass.     Tenderness: There is no abdominal tenderness. There is no guarding or rebound.     Hernia: No hernia is present.  Musculoskeletal:     Cervical back: Normal range of motion and neck supple. No  rigidity.     Right lower leg: No edema.     Left lower leg: No edema.  Lymphadenopathy:     Cervical: No cervical adenopathy.  Skin:    General: Skin is warm and dry.     Findings: No rash.  Neurological:     General: No focal deficit present.     Mental Status: She is alert. Mental status is at baseline.     Comments:  Recall 3/3 Calculation 5/5 DLROW  Psychiatric:        Mood and Affect: Mood normal.        Behavior: Behavior normal.      Results for orders placed or performed in visit on 03/20/22  TSH  Result Value Ref Range   TSH 2.13 0.35 - 5.50 uIU/mL  Microalbumin / creatinine urine ratio  Result Value Ref Range   Microalb, Ur 5.0 (H) 0.0 - 1.9 mg/dL   Creatinine,U 143.6 mg/dL   Microalb Creat Ratio 3.5 0.0 - 30.0 mg/g  CBC with Differential/Platelet  Result Value Ref Range   WBC 7.9 4.0 - 10.5 K/uL   RBC 3.38 (L) 3.87 - 5.11 Mil/uL   Hemoglobin 10.0 (L) 12.0 - 15.0 g/dL   HCT 31.1 (L) 36.0 - 46.0 %   MCV 91.9 78.0 - 100.0 fl   MCHC 32.2 30.0 - 36.0 g/dL   RDW 17.3 (H) 11.5 - 15.5 %   Platelets 394.0 150.0 - 400.0 K/uL   Neutrophils Relative % 74.5 43.0 - 77.0 %   Lymphocytes Relative 20.0 12.0 - 46.0 %   Monocytes Relative 4.6 3.0 - 12.0 %   Eosinophils Relative 0.3 0.0 - 5.0 %   Basophils Relative 0.6 0.0 - 3.0 %   Neutro Abs 5.9 1.4 - 7.7 K/uL   Lymphs Abs 1.6 0.7 - 4.0 K/uL   Monocytes Absolute 0.4 0.1 - 1.0 K/uL   Eosinophils Absolute 0.0 0.0 - 0.7  K/uL   Basophils Absolute 0.0 0.0 - 0.1 K/uL  Comprehensive metabolic panel  Result Value Ref Range   Sodium 143 135 - 145 mEq/L   Potassium 3.1 (L) 3.5 - 5.1 mEq/L   Chloride 100 96 - 112 mEq/L   CO2 32 19 - 32 mEq/L   Glucose, Bld 126 (H) 70 - 99 mg/dL   BUN 12 6 - 23 mg/dL   Creatinine, Ser 0.90 0.40 - 1.20 mg/dL   Total Bilirubin 1.0 0.2 - 1.2 mg/dL   Alkaline Phosphatase 54 39 - 117 U/L   AST 29 0 - 37 U/L   ALT 29 0 - 35 U/L   Total Protein 7.0 6.0 - 8.3 g/dL   Albumin 4.2 3.5 - 5.2 g/dL    GFR 64.29 >60.00 mL/min   Calcium 9.2 8.4 - 10.5 mg/dL  Lipid panel  Result Value Ref Range   Cholesterol 192 0 - 200 mg/dL   Triglycerides 68.0 0.0 - 149.0 mg/dL   HDL 96.90 >39.00 mg/dL   VLDL 13.6 0.0 - 40.0 mg/dL   LDL Cholesterol 82 0 - 99 mg/dL   Total CHOL/HDL Ratio 2    NonHDL 95.38   VITAMIN D 25 Hydroxy (Vit-D Deficiency, Fractures)  Result Value Ref Range   VITD 22.55 (L) 30.00 - 100.00 ng/mL  IBC panel  Result Value Ref Range   Iron 44 42 - 145 ug/dL   Transferrin 408.0 (H) 212.0 - 360.0 mg/dL   Saturation Ratios 7.7 (L) 20.0 - 50.0 %   TIBC 571.2 (H) 250.0 - 450.0 mcg/dL  Ferritin  Result Value Ref Range   Ferritin 6.3 (L) 10.0 - 291.0 ng/mL  Vitamin B12  Result Value Ref Range   Vitamin B-12 143 (L) 211 - 911 pg/mL    Assessment & Plan:   Problem List Items Addressed This Visit     Encounter for general adult medical examination with abnormal findings (Chronic)    Preventative protocols reviewed and updated unless pt declined. Discussed healthy diet and lifestyle. Encouraged updating mammogram, she declines.       Advanced directives, counseling/discussion (Chronic)    Advanced directive - completed at home. Wants daughter to be HCPOA asked to bring [place] a copy.       Medicare annual wellness visit, subsequent - Primary (Chronic)    I have personally reviewed the Medicare Annual Wellness questionnaire and have noted 1. The patient's medical and social history 2. Their use of alcohol, tobacco or illicit drugs 3. Their current medications and supplements 4. The patient's functional ability including ADL's, fall risks, home safety risks and hearing or visual impairment. Cognitive function has been assessed and addressed as indicated.  5. Diet and physical activity 6. Evidence for depression or mood disorders The patients weight, height, BMI have been recorded in the chart. I have made referrals, counseling and provided education to the patient based on  review of the above and I have provided the pt with a written personalized care plan for preventive services. Provider list updated.. See scanned questionairre as needed for further documentation. Reviewed preventative protocols and updated unless pt declined.       Vitamin B12 deficiency    Levels low only on oral replacement. We will start weekly B12 shots for 4 weeks then changed to monthly, likely continue indefinitely.  She desires to receive injections through our office.  First dose today       Iron deficiency anemia    Persistent iron deficiency anemia, oral  iron limited due to GI intolerance. She does endorse ongoing fatigue, but no dyspnea or dizziness or other anemic symptoms. Will order iron infusion [facility] a short stay center. She prefers Feraheme 2 infusions versus Venofer 5 infusions due to work schedule.       Essential hypertension    Chronic, stable on amlodipine-continue this.       Crohn's disease ([place])    Overall stable period on budesonide and mercaptopurine-appreciate [facility].       S/P partial colectomy   MDD (major depressive disorder), recurrent episode, moderate (HCC)    Stable off of Prozac.  Will remain off of antidepressant at this time.       Osteopenia    Consider updated DEXA next year. Discussed calcium intake, calcium supplementation, vitamin D supplementation and importance of regular weightbearing exercise to maintain strong healthy bones.       Systolic murmur    Very mild, will continue to monitor. Anticipate anemia contributing.       Vitamin D deficiency    Levels low despite regular use of 2000 units vitamin D over-the-counter-recommend adding extra calcium/vitamin D supplement daily.       Hypokalemia    Recommend start potassium 20 mill equivalent tablets daily x5 days, then start over-the-counter potassium 99 mg daily         Meds ordered this encounter  Medications   Potassium 99 MG TABS     Sig: Take 1 tablet (99 mg total) by mouth daily.   potassium chloride SA (KLOR-CON M) 10 MEQ tablet    Sig: Take 2 tablets (20 mEq total) by mouth daily for 5 days.    Dispense:  10 tablet    Refill:  0   cyanocobalamin ((VITAMIN B-12)) injection 1,000 mcg   No orders of the defined types were placed in this encounter.   Patient instructions: Consider updated bone density scan next year.  Consider starting calcium + vitamin D supplement daily.  If interested, check with pharmacy about new 2 shot shingles series (shingrix).  Restart weekly vitamin B12 shots for 4 weeks then start monthly b12 shots - first dose today.  Vitamin D was also low - take daily 2000 units, plus cal/vit D.  Take prescription potassium 69mq daily for 5 days then start over the counter potassium 92mdaily.  We will set you up for iron infusion. Will try 2 dose one if not covered will do 5 dose one.  Ok to stay off prozac.  Return in 6 months for follow up visit.   Follow up plan: Return in about 6 months (around 09/26/2022) for follow up visit.  JaRia BushMD

## 2022-03-27 NOTE — Assessment & Plan Note (Signed)
Consider updated DEXA next year. Discussed calcium intake, calcium supplementation, vitamin D supplementation and importance of regular weightbearing exercise to maintain strong healthy bones.

## 2022-03-27 NOTE — Assessment & Plan Note (Signed)
Levels low despite regular use of 2000 units vitamin D over-the-counter-recommend adding extra calcium/vitamin D supplement daily.

## 2022-03-27 NOTE — Assessment & Plan Note (Signed)
Advanced directive - completed at home. Wants daughter to be HCPOA asked to bring [place] a copy.

## 2022-03-27 NOTE — Assessment & Plan Note (Signed)
Very mild, will continue to monitor. Anticipate anemia contributing.

## 2022-03-27 NOTE — Assessment & Plan Note (Signed)
Chronic, stable on amlodipine-continue this.

## 2022-03-30 ENCOUNTER — Telehealth: Payer: Self-pay | Admitting: Pharmacy Technician

## 2022-03-30 NOTE — Telephone Encounter (Signed)
Dr. Lorenso Courier, Juluis Rainier note:  Auth Submission: no auth needed Payer: [employer] medicare Medication & CPT/J Code(s) submitted: Venofer (Iron Sucrose) J1756 Route of submission (phone, fax, portal): phone Auth type: Buy/Bill Units/visits requested: x5 doses Reference number: [id number] Approval from: 03/30/22 to 06/30/22   Patient will be scheduled as soon as possible.

## 2022-04-02 ENCOUNTER — Ambulatory Visit (INDEPENDENT_AMBULATORY_CARE_PROVIDER_SITE_OTHER): Payer: Medicare HMO

## 2022-04-02 DIAGNOSIS — E538 Deficiency of other specified B group vitamins: Secondary | ICD-10-CM | POA: Diagnosis not present

## 2022-04-02 MED ORDER — CYANOCOBALAMIN 1000 MCG/ML IJ SOLN
1000.0000 ug | Freq: Once | INTRAMUSCULAR | Status: AC
  Administered 2022-04-02: 1000 ug via INTRAMUSCULAR

## 2022-04-02 NOTE — Progress Notes (Signed)
Pt came today to get her vit B 12 injection . Pt was given in Vit B12 1,000 Mcg/ml  IM in her right deltoid. Pt  given injection w/o any concerns.

## 2022-04-13 ENCOUNTER — Ambulatory Visit (INDEPENDENT_AMBULATORY_CARE_PROVIDER_SITE_OTHER): Payer: Medicare HMO

## 2022-04-13 VITALS — BP 170/73 | HR 79 | Temp 97.5°F | Resp 18 | Ht 60.0 in | Wt 114.2 lb

## 2022-04-13 DIAGNOSIS — D508 Other iron deficiency anemias: Secondary | ICD-10-CM

## 2022-04-13 DIAGNOSIS — K50118 Crohn's disease of large intestine with other complication: Secondary | ICD-10-CM | POA: Diagnosis not present

## 2022-04-13 MED ORDER — SODIUM CHLORIDE 0.9 % IV SOLN
200.0000 mg | Freq: Once | INTRAVENOUS | Status: AC
  Administered 2022-04-13: 200 mg via INTRAVENOUS
  Filled 2022-04-13: qty 10

## 2022-04-13 NOTE — Progress Notes (Signed)
Diagnosis: Iron Deficiency Anemia  Provider:  Marshell Garfinkel, MD  Procedure: Infusion  IV Type: Peripheral, IV Location: R Antecubital  Venofer (Iron Sucrose), Dose: 200 mg  Infusion Start Time: 1508  Infusion Stop Time: 2440  Post Infusion IV Care: Observation period completed and Peripheral IV Discontinued  Discharge: Condition: Good, Destination: Home . AVS provided to patient.   Performed by:  Arnoldo Morale, RN

## 2022-04-15 ENCOUNTER — Ambulatory Visit (INDEPENDENT_AMBULATORY_CARE_PROVIDER_SITE_OTHER): Payer: Medicare HMO | Admitting: *Deleted

## 2022-04-15 VITALS — BP 149/77 | HR 76 | Temp 97.9°F | Resp 18 | Ht 59.0 in | Wt 114.4 lb

## 2022-04-15 DIAGNOSIS — D508 Other iron deficiency anemias: Secondary | ICD-10-CM | POA: Diagnosis not present

## 2022-04-15 DIAGNOSIS — K50118 Crohn's disease of large intestine with other complication: Secondary | ICD-10-CM | POA: Diagnosis not present

## 2022-04-15 MED ORDER — SODIUM CHLORIDE 0.9 % IV SOLN
200.0000 mg | Freq: Once | INTRAVENOUS | Status: AC
  Administered 2022-04-15: 200 mg via INTRAVENOUS
  Filled 2022-04-15: qty 10

## 2022-04-15 NOTE — Progress Notes (Cosign Needed)
Diagnosis: Iron Deficiency Anemia  Provider:  Marshell Garfinkel, MD  Procedure: Infusion  IV Type: Peripheral, IV Location: R Antecubital  Venofer (Iron Sucrose), Dose: 200 mg  Infusion Start Time: 4801 pm  Infusion Stop Time: 1529 pm  Post Infusion IV Care: Observation period completed and Peripheral IV Discontinued  Discharge: Condition: Good, Destination: Home . AVS provided to patient.   Performed by:  Oren Beckmann, RN

## 2022-04-16 ENCOUNTER — Ambulatory Visit (INDEPENDENT_AMBULATORY_CARE_PROVIDER_SITE_OTHER): Payer: Medicare HMO

## 2022-04-16 DIAGNOSIS — E538 Deficiency of other specified B group vitamins: Secondary | ICD-10-CM | POA: Diagnosis not present

## 2022-04-16 MED ORDER — CYANOCOBALAMIN 1000 MCG/ML IJ SOLN
1000.0000 ug | Freq: Once | INTRAMUSCULAR | Status: AC
  Administered 2022-04-16: 1000 ug via INTRAMUSCULAR

## 2022-04-16 NOTE — Progress Notes (Signed)
Pt came in today to get her vit b12 injection , was told make a appt to come back in next for her next Vit B12 . Pt was in Rt deltoid IM successfully w/o any concern.

## 2022-04-17 ENCOUNTER — Ambulatory Visit (INDEPENDENT_AMBULATORY_CARE_PROVIDER_SITE_OTHER): Payer: Medicare HMO

## 2022-04-17 ENCOUNTER — Telehealth: Payer: Self-pay | Admitting: Family Medicine

## 2022-04-17 VITALS — BP 145/70 | HR 85 | Temp 98.6°F | Resp 21 | Ht 59.0 in | Wt 113.0 lb

## 2022-04-17 DIAGNOSIS — D508 Other iron deficiency anemias: Secondary | ICD-10-CM

## 2022-04-17 DIAGNOSIS — K50118 Crohn's disease of large intestine with other complication: Secondary | ICD-10-CM | POA: Diagnosis not present

## 2022-04-17 MED ORDER — SODIUM CHLORIDE 0.9 % IV SOLN
200.0000 mg | Freq: Once | INTRAVENOUS | Status: AC
  Administered 2022-04-17: 200 mg via INTRAVENOUS
  Filled 2022-04-17: qty 10

## 2022-04-21 ENCOUNTER — Ambulatory Visit (INDEPENDENT_AMBULATORY_CARE_PROVIDER_SITE_OTHER): Payer: Medicare HMO

## 2022-04-21 VITALS — BP 163/77 | HR 94 | Temp 98.5°F | Resp 16 | Ht 59.0 in | Wt 114.0 lb

## 2022-04-21 DIAGNOSIS — K50118 Crohn's disease of large intestine with other complication: Secondary | ICD-10-CM | POA: Diagnosis not present

## 2022-04-21 DIAGNOSIS — D508 Other iron deficiency anemias: Secondary | ICD-10-CM | POA: Diagnosis not present

## 2022-04-21 MED ORDER — SODIUM CHLORIDE 0.9 % IV SOLN
200.0000 mg | Freq: Once | INTRAVENOUS | Status: AC
  Administered 2022-04-21: 200 mg via INTRAVENOUS
  Filled 2022-04-21: qty 10

## 2022-04-21 NOTE — Progress Notes (Signed)
Diagnosis: Iron Deficiency Anemia  Provider:  Marshell Garfinkel, MD  Procedure: Infusion  IV Type: Peripheral, IV Location: R Hand  Venofer (Iron Sucrose), Dose: 200 mg  Infusion Start Time: 7542  Infusion Stop Time: 1532  Post Infusion IV Care: Peripheral IV Discontinued  Discharge: Condition: Good, Destination: Home . AVS provided to patient.   Performed by:  Paul Dykes, RN

## 2022-04-23 ENCOUNTER — Ambulatory Visit (INDEPENDENT_AMBULATORY_CARE_PROVIDER_SITE_OTHER): Payer: Medicare HMO

## 2022-04-23 VITALS — BP 161/75 | HR 86 | Temp 97.5°F | Resp 16 | Ht 59.0 in | Wt 114.6 lb

## 2022-04-23 DIAGNOSIS — K50118 Crohn's disease of large intestine with other complication: Secondary | ICD-10-CM

## 2022-04-23 DIAGNOSIS — D508 Other iron deficiency anemias: Secondary | ICD-10-CM

## 2022-04-23 MED ORDER — SODIUM CHLORIDE 0.9 % IV SOLN
200.0000 mg | Freq: Once | INTRAVENOUS | Status: AC
  Administered 2022-04-23: 200 mg via INTRAVENOUS
  Filled 2022-04-23: qty 10

## 2022-04-23 NOTE — Progress Notes (Signed)
Diagnosis: Iron Deficiency Anemia  Provider:  Marshell Garfinkel, MD  Procedure: Infusion  IV Type: Peripheral, IV Location: L Hand  Venofer (Iron Sucrose), Dose: 200 mg  Infusion Start Time: 6922  Infusion Stop Time: 3009  Post Infusion IV Care: Peripheral IV Discontinued  Discharge: Condition: Good, Destination: Home . AVS provided to patient.   Performed by:  Adelina Mings, LPN

## 2022-05-07 ENCOUNTER — Ambulatory Visit (INDEPENDENT_AMBULATORY_CARE_PROVIDER_SITE_OTHER): Payer: Medicare HMO

## 2022-05-07 DIAGNOSIS — E538 Deficiency of other specified B group vitamins: Secondary | ICD-10-CM

## 2022-05-07 MED ORDER — CYANOCOBALAMIN 1000 MCG/ML IJ SOLN
1000.0000 ug | Freq: Once | INTRAMUSCULAR | Status: AC
  Administered 2022-05-07: 1000 ug via INTRAMUSCULAR

## 2022-05-07 NOTE — Progress Notes (Signed)
Per orders of Dr. Damita Dunnings, injection of vit G92 given by Brenton Grills. Patient tolerated injection well.

## 2022-05-13 ENCOUNTER — Other Ambulatory Visit: Payer: Self-pay | Admitting: Family Medicine

## 2022-05-13 DIAGNOSIS — Z1231 Encounter for screening mammogram for malignant neoplasm of breast: Secondary | ICD-10-CM

## 2022-06-05 ENCOUNTER — Other Ambulatory Visit: Payer: Self-pay | Admitting: Family Medicine

## 2022-06-09 ENCOUNTER — Ambulatory Visit
Admission: RE | Admit: 2022-06-09 | Discharge: 2022-06-09 | Disposition: A | Discharge status: Home / Self Care | Payer: Medicare HMO | Source: Ambulatory Visit | Attending: Family Medicine | Admitting: Family Medicine

## 2022-06-09 ENCOUNTER — Ambulatory Visit (INDEPENDENT_AMBULATORY_CARE_PROVIDER_SITE_OTHER): Payer: Medicare HMO

## 2022-06-09 DIAGNOSIS — E538 Deficiency of other specified B group vitamins: Secondary | ICD-10-CM | POA: Diagnosis not present

## 2022-06-09 DIAGNOSIS — Z1231 Encounter for screening mammogram for malignant neoplasm of breast: Secondary | ICD-10-CM | POA: Insufficient documentation

## 2022-06-09 MED ORDER — CYANOCOBALAMIN 1000 MCG/ML IJ SOLN
1000.0000 ug | Freq: Once | INTRAMUSCULAR | Status: AC
  Administered 2022-06-09: 1000 ug via INTRAMUSCULAR

## 2022-06-09 NOTE — Progress Notes (Signed)
Per orders of Dr. Danise Mina, injection of B12 monthly given by Kris Mouton. Patient tolerated injection well.

## 2022-07-16 ENCOUNTER — Ambulatory Visit: Payer: Medicare HMO

## 2022-07-28 ENCOUNTER — Encounter: Payer: Self-pay | Admitting: Family Medicine

## 2022-07-30 MED ORDER — CYANOCOBALAMIN 1000 MCG/ML IJ SOLN: 1000.0000 ug | INTRAMUSCULAR | 11 refills | Status: DC

## 2022-07-30 NOTE — Telephone Encounter (Signed)
Can we send in appropriate syringes/needles for b12 injections for patient? I've sent in b12 medicine.

## 2022-07-31 MED ORDER — "SYRINGE/NEEDLE (DISP) 23G X 1"" 1 ML MISC": 0 refills | Status: DC

## 2022-07-31 NOTE — Telephone Encounter (Signed)
Script for needles and syringes have been sent in

## 2022-07-31 NOTE — Addendum Note (Signed)
Addended by: Francella Solian on: 07/31/2022 02:51 PM   Modules accepted: Orders

## 2022-08-04 ENCOUNTER — Encounter: Payer: Self-pay | Admitting: Family Medicine

## 2022-08-04 ENCOUNTER — Ambulatory Visit (INDEPENDENT_AMBULATORY_CARE_PROVIDER_SITE_OTHER): Payer: Medicare HMO | Admitting: Family Medicine

## 2022-08-04 VITALS — BP 124/78 | HR 83 | Temp 98.0°F | Ht 59.0 in | Wt 115.0 lb

## 2022-08-04 DIAGNOSIS — H6990 Unspecified Eustachian tube disorder, unspecified ear: Secondary | ICD-10-CM | POA: Diagnosis not present

## 2022-08-04 MED ORDER — PREDNISONE 20 MG PO TABS: 20.0000 mg | ORAL_TABLET | Freq: Every day | ORAL | 0 refills | Status: DC

## 2022-08-04 NOTE — Progress Notes (Unsigned)
URI sx.  Covid neg ~2 weeks ago but multiple exposures who were positive.  Cough from post nasal gtt.  Some residual cough, getting better.  No FCNAV.  R side of throat is irritated with swallowing.  No pain with talking.  Voice sounds normal.  Taking zyrtec.  She can get L ear to "pop."  Already taking pseudophed and flonase.    R ear stopped up since plane flight from [place].    R ETD.

## 2022-08-04 NOTE — Patient Instructions (Signed)
Skip budesonide and start prednisone with food.  Gently try to pop your ears and use flonase.  Take care.  Glad to see you.

## 2022-08-05 NOTE — Assessment & Plan Note (Signed)
Likely postviral eustachian tube dysfunction.  Still okay for outpatient follow-up.  Anatomy discussed with patient.  Steroid cautions discussed with patient. Skip budesonide and start prednisone with food.  Gently perform Valsalva and use flonase.  She can update [place] as needed.

## 2022-08-19 ENCOUNTER — Ambulatory Visit: Payer: Medicare HMO

## 2022-08-27 DIAGNOSIS — K219 Gastro-esophageal reflux disease without esophagitis: Secondary | ICD-10-CM | POA: Diagnosis not present

## 2022-08-27 DIAGNOSIS — Z86010 Personal history of colonic polyps: Secondary | ICD-10-CM | POA: Diagnosis not present

## 2022-08-27 DIAGNOSIS — D649 Anemia, unspecified: Secondary | ICD-10-CM | POA: Diagnosis not present

## 2022-08-27 DIAGNOSIS — K5000 Crohn's disease of small intestine without complications: Secondary | ICD-10-CM | POA: Diagnosis not present

## 2022-08-27 DIAGNOSIS — Z9049 Acquired absence of other specified parts of digestive tract: Secondary | ICD-10-CM | POA: Diagnosis not present

## 2022-08-27 DIAGNOSIS — R948 Abnormal results of function studies of other organs and systems: Secondary | ICD-10-CM | POA: Diagnosis not present

## 2022-08-27 DIAGNOSIS — E538 Deficiency of other specified B group vitamins: Secondary | ICD-10-CM | POA: Diagnosis not present

## 2022-08-27 LAB — BASIC METABOLIC PANEL: Potassium: 2.8 mEq/L — AB (ref 3.5–5.1)

## 2022-08-27 LAB — VITAMIN B12: Vitamin B-12: 577

## 2022-10-02 ENCOUNTER — Encounter: Payer: Self-pay | Admitting: Family Medicine

## 2022-10-02 ENCOUNTER — Ambulatory Visit (INDEPENDENT_AMBULATORY_CARE_PROVIDER_SITE_OTHER): Payer: Medicare HMO | Admitting: Family Medicine

## 2022-10-02 VITALS — BP 136/76 | HR 75 | Temp 97.1°F | Ht 59.0 in | Wt 112.4 lb

## 2022-10-02 DIAGNOSIS — E876 Hypokalemia: Secondary | ICD-10-CM | POA: Diagnosis not present

## 2022-10-02 DIAGNOSIS — K50118 Crohn's disease of large intestine with other complication: Secondary | ICD-10-CM | POA: Diagnosis not present

## 2022-10-02 DIAGNOSIS — E559 Vitamin D deficiency, unspecified: Secondary | ICD-10-CM

## 2022-10-02 DIAGNOSIS — E538 Deficiency of other specified B group vitamins: Secondary | ICD-10-CM | POA: Diagnosis not present

## 2022-10-02 DIAGNOSIS — D508 Other iron deficiency anemias: Secondary | ICD-10-CM

## 2022-10-02 NOTE — Progress Notes (Signed)
Patient ID: Michelle Vasquez, female    DOB: Jul 19, 1951, 71 y.o.   MRN: [id number]  This visit was conducted in person.  BP 136/76   Pulse 75   Temp (!) 97.1 F (36.2 C) (Temporal)   Ht 4' 11"  (1.499 m)   Wt 112 lb 6.4 oz (51 kg)   SpO2 97%   BMI 22.70 kg/m    CC: 6 mo f/u visit  Subjective:   HPI: Michelle Vasquez is a 71 y.o. female presenting on 10/02/2022 for Follow-up (Here for 6 mo f/u.)   Crohn's - sees [facility] Dr Alessandra Bevels regularly, last seen 08/2022. At that time potassium was 2.8 - she started OTC 54m potassium BID.  IDA with Hgb down to 10, ferritin 6, % sat 7.7% - she completed Venofer iron infusions x5 03/2022 with good effect. No further PICA. Still notes some lightheadedness, unsteadiness. Now back on oral iron MWF.   Vit D deficiency - continues 2000 IU daily.  Vit B12 deficiency - continues monthly B12 injections. She has started doing injections at home - last shot was 10 days ago.  B12 577 (08/27/2022), folate >20 (08/27/2022).     Relevant past medical, surgical, family and social history reviewed and updated as indicated. Interim medical history since our last visit reviewed. Allergies and medications reviewed and updated. Outpatient Medications Prior to Visit  Medication Sig Dispense Refill   amLODipine (NORVASC) 5 MG tablet TAKE 1 TABLET BY MOUTH ONCE A DAY 90 tablet 3   budesonide (ENTOCORT EC) 3 MG 24 hr capsule 1 capsule     cetirizine (ZYRTEC) 10 MG tablet TAKE ONE TABLET DAILY AS DIRECTED 100 tablet 3   Cholecalciferol (VITAMIN D) 50 MCG (2000 UT) CAPS Take 1 capsule (2,000 Units total) by mouth daily. 90 capsule 3   cyanocobalamin (VITAMIN B12) 1000 MCG/ML injection Inject 1 mL (1,000 mcg total) into the skin every 30 (thirty) days. 1 mL 11   ferrous sulfate (FEROSUL) 325 (65 FE) MG tablet Take 1 tablet (325 mg total) by mouth every Monday, Wednesday, and Friday.     folic acid (FOLVITE) 8659MCG tablet Take 1 tablet (800 mcg total) by mouth daily.      LINZESS 72 MCG capsule TAKE 1 CAPSULE BY MOUTH ONCE DAILY BEFORE BREAKFAST (Patient taking differently: As needed) 90 capsule 2   Melatonin 10 MG TABS Take 5 mg by mouth at bedtime.     mercaptopurine (PURINETHOL) 50 MG tablet TAKE 1 TABLET BY MOUTH DAILY 1 HOUR BEFORE OR 2 HOURS AFTER MEALS ON AN EMPTY STOMACH. 90 tablet 3   pantoprazole (PROTONIX) 40 MG tablet Take 1 tablet by mouth daily.     SYRINGE/NEEDLE, DISP, 1 ML 23G X 1" 1 ML MISC Use as directed 8 each 0   Potassium 99 MG TABS Take 1 tablet (99 mg total) by mouth daily.     predniSONE (DELTASONE) 20 MG tablet Take 1 tablet (20 mg total) by mouth daily with breakfast. 7 tablet 0   No facility-administered medications prior to visit.     Per HPI unless specifically indicated in ROS section below Review of Systems  Objective:  BP 136/76   Pulse 75   Temp (!) 97.1 F (36.2 C) (Temporal)   Ht 4' 11"  (1.499 m)   Wt 112 lb 6.4 oz (51 kg)   SpO2 97%   BMI 22.70 kg/m   Wt Readings from Last 3 Encounters:  10/02/22 112 lb 6.4 oz (51  kg)  08/04/22 115 lb (52.2 kg)  04/23/22 114 lb 9.6 oz (52 kg)      Physical Exam Vitals and nursing note reviewed.  Constitutional:      Appearance: Normal appearance. She is not ill-appearing.  HENT:     Mouth/Throat:     Mouth: Mucous membranes are moist.     Pharynx: Oropharynx is clear. No oropharyngeal exudate or posterior oropharyngeal erythema.  Eyes:     Extraocular Movements: Extraocular movements intact.     Pupils: Pupils are equal, round, and reactive to light.  Cardiovascular:     Rate and Rhythm: Normal rate and regular rhythm.     Pulses: Normal pulses.     Heart sounds: Murmur (2/6 systolic USB) heard.  Pulmonary:     Effort: Pulmonary effort is normal. No respiratory distress.     Breath sounds: Normal breath sounds. No wheezing, rhonchi or rales.  Musculoskeletal:     Right lower leg: No edema.     Left lower leg: No edema.  Skin:    General: Skin is warm and  dry.     Findings: No rash.  Neurological:     Mental Status: She is alert.  Psychiatric:        Mood and Affect: Mood normal.        Behavior: Behavior normal.       Results for orders placed or performed in visit on 54/65/03  Basic metabolic panel  Result Value Ref Range   Glucose, Bld 94 65 - 99 mg/dL   BUN 12 7 - 25 mg/dL   Creat 0.84 0.60 - 1.00 mg/dL   BUN/Creatinine Ratio SEE NOTE: 6 - 22 (calc)   Sodium 141 135 - 146 mmol/L   Potassium 4.0 3.5 - 5.3 mmol/L   Chloride 102 98 - 110 mmol/L   CO2 26 20 - 32 mmol/L   Calcium 9.9 8.6 - 10.4 mg/dL  CBC with Differential/Platelet  Result Value Ref Range   WBC 7.1 3.8 - 10.8 Thousand/uL   RBC 3.71 (L) 3.80 - 5.10 Million/uL   Hemoglobin 12.6 11.7 - 15.5 g/dL   HCT 36.6 35.0 - 45.0 %   MCV 98.7 80.0 - 100.0 fL   MCH 34.0 (H) 27.0 - 33.0 pg   MCHC 34.4 32.0 - 36.0 g/dL   RDW 13.1 11.0 - 15.0 %   Platelets 320 140 - 400 Thousand/uL   MPV 9.2 7.5 - 12.5 fL   Neutro Abs 5,034 1,500 - 7,800 cells/uL   Lymphs Abs 1,661 850 - 3,900 cells/uL   Absolute Monocytes 355 200 - 950 cells/uL   Eosinophils Absolute 21 15 - 500 cells/uL   Basophils Absolute 28 0 - 200 cells/uL   Neutrophils Relative % 70.9 %   Total Lymphocyte 23.4 %   Monocytes Relative 5.0 %   Eosinophils Relative 0.3 %   Basophils Relative 0.4 %  VITAMIN D 25 Hydroxy (Vit-D Deficiency, Fractures)  Result Value Ref Range   Vit D, 25-Hydroxy 26 (L) 30 - 100 ng/mL  Iron, TIBC and Ferritin Panel  Result Value Ref Range   Iron 90 45 - 160 mcg/dL   TIBC 430 250 - 450 mcg/dL (calc)   %SAT 21 16 - 45 % (calc)   Ferritin 58 16 - 288 ng/mL  Basic metabolic panel  Result Value Ref Range   Potassium 2.8 (A) 3.5 - 5.1 mEq/L  Vitamin B12  Result Value Ref Range   Vitamin B-12 577  Assessment & Plan:   Problem List Items Addressed This Visit       Unprioritized   Vitamin B12 deficiency    B12 levels repleted on monthly b12 shots done at home (her daughter  administers injection). Recent b12 level normal at GI - did not repeat today       Iron deficiency anemia - Primary    Did well with Venofer infusions x5 03/2022 with resolution of Pica symptoms.  Will update iron panel and CBC.  She continues oral iron MWF.       Relevant Orders   CBC with Differential/Platelet (Completed)   Iron, TIBC and Ferritin Panel (Completed)   Crohn's disease ([place])    Has recently been followed by [facility] however she would like to establish back with [facility] closer to home. She previously saw [facility]. New referral placed.       Relevant Orders   Basic metabolic panel (Completed)   CBC with Differential/Platelet (Completed)   Ambulatory referral to Gastroenterology   Vitamin D deficiency    Update vit D levels on 2000 IU daily replacement,.       Relevant Orders   VITAMIN D 25 Hydroxy (Vit-D Deficiency, Fractures) (Completed)   Hypokalemia    Worse on recent GI labwork down to 2.8 s/p oral replacement, she's recently been taking OTC potassium 75m BID. Update BMP, I did suggest she drop pantoprazole to 437mQOD dosing.       Relevant Orders   Basic metabolic panel (Completed)     Meds ordered this encounter  Medications   Potassium 99 MG TABS    Sig: Take 1 tablet (99 mg total) by mouth in the morning and at bedtime.    Dispense:  330 tablet   Orders Placed This Encounter  Procedures   Basic metabolic panel   CBC with Differential/Platelet   VITAMIN D 25 Hydroxy (Vit-D Deficiency, Fractures)   Iron, TIBC and Ferritin Panel   Basic metabolic panel    This external order was created through the Results Console.   Vitamin B12    This external order was created through the Results Console.   Ambulatory referral to Gastroenterology    Referral Priority:   Routine    Referral Type:   Consultation    Referral Reason:   Specialty Services Required    Number of Visits Requested:   1     Patient Instructions  New referral to [facility] for closer location. You may call [facility] at [phone] to schedule an appointment.  Try every other day pantoprazole.  Repeat labs today including potassium.  Good to see you today.  Return in 6 months for physical/wellness visit   Follow up plan: Return in about 6 months (around 04/03/2023) for annual exam, prior fasting for blood work, medicare wellness visit.  JaRia BushMD

## 2022-10-02 NOTE — Assessment & Plan Note (Signed)
Worse on recent GI labwork down to 2.8 s/p oral replacement, she's recently been taking OTC potassium 63m BID. Update BMP, I did suggest she drop pantoprazole to 456mQOD dosing.

## 2022-10-02 NOTE — Assessment & Plan Note (Addendum)
B12 levels repleted on monthly b12 shots done at home (her daughter administers injection). Recent b12 level normal at GI - did not repeat today

## 2022-10-02 NOTE — Assessment & Plan Note (Signed)
Update vit D levels on 2000 IU daily replacement,.

## 2022-10-02 NOTE — Patient Instructions (Addendum)
New referral to [facility] for closer location. You may call [facility] at [phone] to schedule an appointment.  Try every other day pantoprazole.  Repeat labs today including potassium.  Good to see you today.  Return in 6 months for physical/wellness visit

## 2022-10-02 NOTE — Assessment & Plan Note (Signed)
Did well with Venofer infusions x5 03/2022 with resolution of Pica symptoms.  Will update iron panel and CBC.  She continues oral iron MWF.

## 2022-10-02 NOTE — Assessment & Plan Note (Signed)
Has recently been followed by [facility] however she would like to establish back with [facility] closer to home. She previously saw [facility]. New referral placed.

## 2022-10-03 LAB — BASIC METABOLIC PANEL
BUN: 12 mg/dL (ref 7–25)
CO2: 26 mmol/L (ref 20–32)
Calcium: 9.9 mg/dL (ref 8.6–10.4)
Chloride: 102 mmol/L (ref 98–110)
Creat: 0.84 mg/dL (ref 0.60–1.00)
Glucose, Bld: 94 mg/dL (ref 65–99)
Potassium: 4 mmol/L (ref 3.5–5.3)
Sodium: 141 mmol/L (ref 135–146)

## 2022-10-03 LAB — VITAMIN D 25 HYDROXY (VIT D DEFICIENCY, FRACTURES): Vit D, 25-Hydroxy: 26 ng/mL — ABNORMAL LOW (ref 30–100)

## 2022-10-03 LAB — CBC WITH DIFFERENTIAL/PLATELET
Absolute Monocytes: 355 cells/uL (ref 200–950)
Basophils Absolute: 28 cells/uL (ref 0–200)
Basophils Relative: 0.4 %
Eosinophils Absolute: 21 cells/uL (ref 15–500)
Eosinophils Relative: 0.3 %
HCT: 36.6 % (ref 35.0–45.0)
Hemoglobin: 12.6 g/dL (ref 11.7–15.5)
Lymphs Abs: 1661 cells/uL (ref 850–3900)
MCH: 34 pg — ABNORMAL HIGH (ref 27.0–33.0)
MCHC: 34.4 g/dL (ref 32.0–36.0)
MCV: 98.7 fL (ref 80.0–100.0)
MPV: 9.2 fL (ref 7.5–12.5)
Monocytes Relative: 5 %
Neutro Abs: 5034 cells/uL (ref 1500–7800)
Neutrophils Relative %: 70.9 %
Platelets: 320 10*3/uL (ref 140–400)
RBC: 3.71 10*6/uL — ABNORMAL LOW (ref 3.80–5.10)
RDW: 13.1 % (ref 11.0–15.0)
Total Lymphocyte: 23.4 %
WBC: 7.1 10*3/uL (ref 3.8–10.8)

## 2022-10-03 LAB — IRON,TIBC AND FERRITIN PANEL
%SAT: 21 % (calc) (ref 16–45)
Ferritin: 58 ng/mL (ref 16–288)
Iron: 90 ug/dL (ref 45–160)
TIBC: 430 mcg/dL (calc) (ref 250–450)

## 2022-10-07 MED ORDER — POTASSIUM 99 MG PO TABS: 1.0000 | ORAL_TABLET | Freq: Two times a day (BID) | ORAL | Status: DC

## 2022-10-07 NOTE — Addendum Note (Signed)
Addended by: [name] on: 10/07/2022 08:14 AM   Modules accepted: Orders

## 2022-10-23 ENCOUNTER — Ambulatory Visit (INDEPENDENT_AMBULATORY_CARE_PROVIDER_SITE_OTHER): Payer: Medicare HMO | Admitting: Nurse Practitioner

## 2022-10-23 ENCOUNTER — Encounter: Payer: Self-pay | Admitting: Nurse Practitioner

## 2022-10-23 VITALS — BP 130/78 | HR 72 | Temp 98.0°F | Resp 16 | Ht 59.0 in | Wt 111.5 lb

## 2022-10-23 DIAGNOSIS — R6883 Chills (without fever): Secondary | ICD-10-CM

## 2022-10-23 DIAGNOSIS — L989 Disorder of the skin and subcutaneous tissue, unspecified: Secondary | ICD-10-CM | POA: Diagnosis not present

## 2022-10-23 LAB — CBC
HCT: 37.3 % (ref 36.0–46.0)
Hemoglobin: 12.5 g/dL (ref 12.0–15.0)
MCHC: 33.4 g/dL (ref 30.0–36.0)
MCV: 99.6 fl (ref 78.0–100.0)
Platelets: 353 10*3/uL (ref 150.0–400.0)
RBC: 3.74 Mil/uL — ABNORMAL LOW (ref 3.87–5.11)
RDW: 16.3 % — ABNORMAL HIGH (ref 11.5–15.5)
WBC: 7.5 10*3/uL (ref 4.0–10.5)

## 2022-10-23 NOTE — Assessment & Plan Note (Signed)
Patient's concern for infection with skin lesion will check CBC today pending labs

## 2022-10-23 NOTE — Progress Notes (Signed)
   Acute Office Visit  Subjective:     Patient ID: Michelle Vasquez, female    DOB: [birth year], 71 y.o.   MRN: [id number]  Chief Complaint  Patient presents with   Wound Check    Place on right arm since Nov 28 and in the last 1 week it has changed. Painful if anything touches it.     Patient is in today for skin issue  States that she noticed a place on her right arm approx 1 month ago (11/28). States that it started as a raised pink bump that was raised. States that she just happened to notice it. Over the past week it has gotten hot and black center.. Chills and painful to the touch. No discharge   Review of Systems  Constitutional:  Positive for chills. Negative for fever.  Respiratory:  Negative for shortness of breath.   Cardiovascular:  Negative for chest pain.  Skin:        "+" skin lesion        Objective:    BP 130/78   Pulse 72   Temp 98 F (36.7 C)   Resp 16   Ht 4' 11"  (1.499 m)   Wt 111 lb 8 oz (50.6 kg)   SpO2 97%   BMI 22.52 kg/m    Physical Exam Vitals and nursing note reviewed.  Constitutional:      Appearance: Normal appearance.  Cardiovascular:     Rate and Rhythm: Normal rate and regular rhythm.     Pulses:          Radial pulses are 2+ on the right side.     Heart sounds: Normal heart sounds.  Pulmonary:     Effort: Pulmonary effort is normal.     Breath sounds: Normal breath sounds.  Skin:    General: Skin is warm.     Findings: Lesion present.     Comments: 2cm x 1.75cm  See clinical photo  Neurological:     Mental Status: She is alert.     No results found for any visits on 10/23/22.      Assessment & Plan:   Problem List Items Addressed This Visit       Musculoskeletal and Integument   Skin lesion - Primary    See clinical photo.  Patient has appoint with dermatology on 10/28/2022.  Encouraged to keep that appointment and bring this lesion up during that appointment.  Discussed signs and symptoms of infection and to  be reevaluated if cough does not open.        Other   Chills    Patient's concern for infection with skin lesion will check CBC today pending labs      Relevant Orders   CBC    No orders of the defined types were placed in this encounter.   Return if symptoms worsen or fail to improve.  Romilda Garret, NP

## 2022-10-23 NOTE — Assessment & Plan Note (Signed)
See clinical photo.  Patient has appoint with dermatology on 10/28/2022.  Encouraged to keep that appointment and bring this lesion up during that appointment.  Discussed signs and symptoms of infection and to be reevaluated if cough does not open.

## 2022-10-23 NOTE — Patient Instructions (Signed)
Nice to see you today Try and keep it covered when you are out to avoid catching it on something Keep the appointment with Dermatology  If the redness starts spreading up or down the arm let us know or be seen again over the weekend

## 2022-10-28 DIAGNOSIS — L814 Other melanin hyperpigmentation: Secondary | ICD-10-CM | POA: Diagnosis not present

## 2022-10-28 DIAGNOSIS — L570 Actinic keratosis: Secondary | ICD-10-CM | POA: Diagnosis not present

## 2022-10-28 DIAGNOSIS — C44622 Squamous cell carcinoma of skin of right upper limb, including shoulder: Secondary | ICD-10-CM | POA: Diagnosis not present

## 2022-10-28 DIAGNOSIS — C44722 Squamous cell carcinoma of skin of right lower limb, including hip: Secondary | ICD-10-CM | POA: Diagnosis not present

## 2022-10-28 DIAGNOSIS — D225 Melanocytic nevi of trunk: Secondary | ICD-10-CM | POA: Diagnosis not present

## 2022-10-28 DIAGNOSIS — L821 Other seborrheic keratosis: Secondary | ICD-10-CM | POA: Diagnosis not present

## 2022-10-28 DIAGNOSIS — D492 Neoplasm of unspecified behavior of bone, soft tissue, and skin: Secondary | ICD-10-CM | POA: Diagnosis not present

## 2022-10-29 ENCOUNTER — Encounter: Payer: Self-pay | Admitting: Gastroenterology

## 2022-10-29 ENCOUNTER — Other Ambulatory Visit: Payer: Self-pay

## 2022-10-29 ENCOUNTER — Telehealth (INDEPENDENT_AMBULATORY_CARE_PROVIDER_SITE_OTHER): Payer: PPO | Admitting: Gastroenterology

## 2022-10-29 DIAGNOSIS — D518 Other vitamin B12 deficiency anemias: Secondary | ICD-10-CM | POA: Diagnosis not present

## 2022-10-29 DIAGNOSIS — D509 Iron deficiency anemia, unspecified: Secondary | ICD-10-CM

## 2022-10-29 DIAGNOSIS — Z8719 Personal history of other diseases of the digestive system: Secondary | ICD-10-CM

## 2022-10-29 MED ORDER — NA SULFATE-K SULFATE-MG SULF 17.5-3.13-1.6 GM/177ML PO SOLN: 354.0000 mL | Freq: Once | ORAL | 0 refills | Status: AC

## 2022-10-29 NOTE — Progress Notes (Signed)
Michelle Sear, MD [address]  [place]  [place], [place] [address]  Main: [phone]  Fax: [phone]    Gastroenterology Consultation Video Visit  Referring Provider:     [name], MD Primary Care Physician:  [name], MD Primary Gastroenterologist: [facility] gastroenterology Reason for Consultation: Small bowel Crohn's        HPI:   Michelle Vasquez is a 72 y.o. female referred by Dr. [name], MD  for consultation & management of small bowel Crohn's  Virtual Visit Video Note  I connected with Michelle Vasquez on 10/29/22 at 10:30 AM EST by video and verified that I am speaking with the correct person using two identifiers.   I discussed the limitations, risks, security and privacy concerns of performing an evaluation and management service by video and the availability of in person appointments. I also discussed with the patient that there may be a patient responsible charge related to this service. The patient expressed understanding and agreed to proceed.  Location of the Patient: Home  Location of the provider: Home office  Persons participating in the visit: Patient and provider only   History of Present Illness: Michelle Vasquez is a pleasant 72 year old female with history of Crohn's disease.  She is currently maintained on mercaptopurine 50 mg daily and budesonide 3 mg daily.  Her main symptom is chronic nonbloody diarrhea, has bowel movements up to 6 times daily for which she takes Imodium as needed which leads to constipation.  Her weight has been stable, reports good appetite.  She takes B12 shots and oral iron supplements.  She wanted to switch her care to [facility] to be within [facility] health system and stay locally.   Crohn's disease classification:  Age: <17 Location: Ileal Behavior: stricturing Perianal:no  IBD diagnosis: small bowel Crohn's at age 63  Disease course: Small bowel involving terminal ileum Crohn's at age  86.  Underwent terminal ileum resection in 1981.  She had history of intermittent partial small bowel obstructions.  She has been maintained on low-dose 6-MP 50 mg daily since 2006.  She was found to have postop recurrence of Crohn's disease at the ilio colic anastomosis with narrowing.  Since 2021, patient is also maintained on budesonide 3 mg daily.  Her symptoms are not in remission.  Her symptoms include nonbloody diarrhea, up to 6 times daily associated with some abdominal bloating and pain.  She has chronic iron and B12 deficiency anemia, takes oral iron as well as B12 injections.  Last colonoscopy in 12/2019 revealed narrowing and ulceration of the ileocolonic anastomosis  Extra intestinal manifestations: None  IBD surgical history: Terminal ileal resection in 1981  Imaging:  MRE none CTE 11/25/2020 IMPRESSION: 1. Mild-to-moderate wall thickening and mild mucosal hyperenhancement in the terminal ileum, mildly worsened since 12/30/2018 CT enterography study, compatible with active Crohn disease. No evidence of bowel obstruction, fistula or abscess. No additional sites of active bowel inflammation. 2. Small hiatal hernia. 3. Aortic Atherosclerosis  SBFT none  Procedures:  Colonoscopy 01/15/2020 Perianal skin tags.  Not patent ileocolic anastomosis, ulcerated, biopsies performed 2 subcentimeter polyps in the colon, resected Pathology revealed tubular adenoma Ulcerated small bowel anastomotic mucosa  Upper Endoscopy 11/22/2017 - Benign-appearing esophageal stenosis. Dilated. - Esophagogastric landmarks identified. - Normal stomach. - Duodenal diverticulum. - The examination was otherwise normal. - No specimens collected.  VCE none  IBD medications:  Steroids: Received prednisone in the past, currently on budesonide 3 mg daily 5-ASA: None Immunomodulators:  AZA, methotrexate On 6-MP 50 mg daily since 2006 TPMT status  Biologics: None Anti TNFs: Anti  Integrins: Ustekinumab: Tofactinib: Clinical trial:   NSAIDs: None  Antiplts/Anticoagulants/Anti thrombotics: None   Past Medical History:  Diagnosis Date   Allergy    seasonal   Anxiety and depression 07/03/2011   B12 deficiency    Blood transfusion without reported diagnosis    Central hearing loss    COVID-19 virus infection 03/03/2021   Crohn disease ([place])    s/p colectomy on 6MP, ?bacterial overgrowth   Fatty liver 12/15/11   History of small bowel obstruction    HTN (hypertension)    Internal hemorrhoids without mention of complication    Osteomalacia, unspecified    Perennial allergic rhinitis with seasonal variation    Unspecified deficiency anemia    Unspecified polyarthropathy or polyarthritis, multiple sites     Past Surgical History:  Procedure Laterality Date   APPENDECTOMY     COLONOSCOPY  02/25/05   Internal hemms; crohns; narrow anast   COLONOSCOPY  02/26/10   Prior right hemicolectomy o/w benign (Dr. Olevia Perches)   COLONOSCOPY  02/2015   benign biopsies, crohn's with stenotic stricture at anastomosis Olevia Perches)   COLONOSCOPY  12/2019   small adenomas, crohn's disease small intestine, diverticulosis, non-patent end to side ileocolonic anastomosis with inflammation and ulceration biopsied, rpt 3 yrs (Brahmbhatt)   DEXA  07/06/2011   normal T score -1.0 femur, spine   ESOPHAGOGASTRODUODENOSCOPY  02/25/05   Dilated stricture   ESOPHAGOGASTRODUODENOSCOPY  10/2017   esophageal stenosis dilated, duodenal diverticuluc ([place])   HEMICOLECTOMY  1980   all small intestine, none large   OTHER SURGICAL HISTORY  1960s, 1978, 1980   ileal resection x 3 and subtotal colon resection   TOTAL ABDOMINAL HYSTERECTOMY  1975   ovaries out as well   UPPER GASTROINTESTINAL ENDOSCOPY       Current Outpatient Medications:    Na Sulfate-K Sulfate-Mg Sulf 17.5-3.13-1.6 GM/177ML SOLN, Take 354 mLs by mouth once for 1 dose., Disp: 354 mL, Rfl: 0   amLODipine (NORVASC) 5 MG tablet, TAKE  1 TABLET BY MOUTH ONCE A DAY, Disp: 90 tablet, Rfl: 3   budesonide (ENTOCORT EC) 3 MG 24 hr capsule, 1 capsule, Disp: , Rfl:    cetirizine (ZYRTEC) 10 MG tablet, TAKE ONE TABLET DAILY AS DIRECTED, Disp: 100 tablet, Rfl: 3   Cholecalciferol (VITAMIN D) 50 MCG (2000 UT) CAPS, Take 1 capsule (2,000 Units total) by mouth daily., Disp: 90 capsule, Rfl: 3   cyanocobalamin (VITAMIN B12) 1000 MCG/ML injection, Inject 1 mL (1,000 mcg total) into the skin every 30 (thirty) days., Disp: 1 mL, Rfl: 11   ferrous sulfate (FEROSUL) 325 (65 FE) MG tablet, Take 1 tablet (325 mg total) by mouth every Monday, Wednesday, and Friday., Disp: , Rfl:    folic acid (FOLVITE) 297 MCG tablet, Take 1 tablet (800 mcg total) by mouth daily., Disp: , Rfl:    LINZESS 72 MCG capsule, TAKE 1 CAPSULE BY MOUTH ONCE DAILY BEFORE BREAKFAST (Patient taking differently: As needed), Disp: 90 capsule, Rfl: 2   Melatonin 10 MG TABS, Take 5 mg by mouth at bedtime., Disp: , Rfl:    mercaptopurine (PURINETHOL) 50 MG tablet, TAKE 1 TABLET BY MOUTH DAILY 1 HOUR BEFORE OR 2 HOURS AFTER MEALS ON AN EMPTY STOMACH., Disp: 90 tablet, Rfl: 3   pantoprazole (PROTONIX) 40 MG tablet, Take 1 tablet by mouth daily., Disp: , Rfl:    Potassium 99 MG TABS, Take 1  tablet (99 mg total) by mouth in the morning and at bedtime., Disp: 330 tablet, Rfl:    SYRINGE/NEEDLE, DISP, 1 ML 23G X 1" 1 ML MISC, Use as directed, Disp: 8 each, Rfl: 0   Family History  Problem Relation Age of Onset   Crohn's disease Father        colostomy   Hypertension Mother    Heart disease Mother 60   Alcohol abuse Brother    Cirrhosis Brother    Crohn's disease Sister    Crohn's disease Paternal Aunt        x 3   Crohn's disease Other        neice x 2   Colon cancer Neg Hx    Cancer Neg Hx    Diabetes Neg Hx    Rectal cancer Neg Hx    Stomach cancer Neg Hx    Esophageal cancer Neg Hx    Breast cancer Neg Hx      Social History   Tobacco Use   Smoking status: Former     Packs/day: 0.25    Years: 15.00    Total pack years: 3.75    Types: Cigarettes    Quit date: 10/26/1978    Years since quitting: 44.0   Smokeless tobacco: Never  Vaping Use   Vaping Use: Never used  Substance Use Topics   Alcohol use: No   Drug use: No    Allergies as of 10/29/2022 - Review Complete 10/23/2022  Allergen Reaction Noted   Celexa [citalopram hydrobromide] Other (See Comments) 01/10/2014    Imaging Studies: Reviewed  Assessment and Plan:   Michelle Vasquez is a 72 y.o. pleasant Caucasian female with small bowel Crohn's diagnosed at age 16, s/p ileocecal resection in 1981, with postop recurrence at the anastomosis, currently maintained on mercaptopurine and budesonide not in clinical remission  Recommend to check fecal calprotectin levels Increase budesonide to 9 mg daily for 2 weeks, if diarrhea does not improve, advised her to stop Recommend upper endoscopy as well as colonoscopy to evaluate the severity of Crohn's disease Patient will most likely need biologic therapy   Follow Up Instructions:   I discussed the assessment and treatment plan with the patient. The patient was provided an opportunity to ask questions and all were answered. The patient agreed with the plan and demonstrated an understanding of the instructions.   The patient was advised to call back or seek an in-person evaluation if the symptoms worsen or if the condition fails to improve as anticipated.  I provided 25 minutes of face-to-face time during this encounter.   Follow up in 4 to 6 weeks   Cephas Darby, MD

## 2022-11-03 DIAGNOSIS — Z8719 Personal history of other diseases of the digestive system: Secondary | ICD-10-CM | POA: Diagnosis not present

## 2022-11-08 LAB — CALPROTECTIN, FECAL: Calprotectin, Fecal: 68 ug/g (ref 0–120)

## 2022-11-10 ENCOUNTER — Telehealth: Payer: Self-pay | Admitting: Gastroenterology

## 2022-11-10 NOTE — Telephone Encounter (Signed)
Patient called wanting to cancel all procedures and appointments. States she is going to her previous GI.

## 2022-11-10 NOTE — Telephone Encounter (Signed)
Called [facility] and got patient canceled for the colonoscopy

## 2022-11-18 ENCOUNTER — Ambulatory Visit: Admit: 2022-11-18 | Payer: Medicare HMO | Admitting: Gastroenterology

## 2022-11-18 SURGERY — COLONOSCOPY WITH PROPOFOL
Anesthesia: General

## 2022-11-26 DIAGNOSIS — C44629 Squamous cell carcinoma of skin of left upper limb, including shoulder: Secondary | ICD-10-CM

## 2022-11-26 HISTORY — DX: Squamous cell carcinoma of skin of left upper limb, including shoulder: C44.629

## 2022-12-02 DIAGNOSIS — C44629 Squamous cell carcinoma of skin of left upper limb, including shoulder: Secondary | ICD-10-CM | POA: Diagnosis not present

## 2022-12-02 DIAGNOSIS — B079 Viral wart, unspecified: Secondary | ICD-10-CM | POA: Diagnosis not present

## 2022-12-02 DIAGNOSIS — D492 Neoplasm of unspecified behavior of bone, soft tissue, and skin: Secondary | ICD-10-CM | POA: Diagnosis not present

## 2022-12-02 DIAGNOSIS — D0462 Carcinoma in situ of skin of left upper limb, including shoulder: Secondary | ICD-10-CM | POA: Diagnosis not present

## 2022-12-07 ENCOUNTER — Encounter: Payer: Self-pay | Admitting: Family Medicine

## 2022-12-07 ENCOUNTER — Ambulatory Visit: Payer: Medicare HMO | Admitting: Gastroenterology

## 2022-12-08 ENCOUNTER — Encounter: Payer: Self-pay | Admitting: Family Medicine

## 2022-12-08 ENCOUNTER — Other Ambulatory Visit: Payer: Self-pay | Admitting: *Deleted

## 2022-12-08 ENCOUNTER — Telehealth: Payer: Self-pay | Admitting: *Deleted

## 2022-12-08 DIAGNOSIS — Z8719 Personal history of other diseases of the digestive system: Secondary | ICD-10-CM

## 2022-12-08 DIAGNOSIS — D509 Iron deficiency anemia, unspecified: Secondary | ICD-10-CM

## 2022-12-08 NOTE — Telephone Encounter (Signed)
Patient called back to schedule her colonoscopy and EGD. It was canceled on 11/18/2022.  We are scheduling to 12/29/2022 with Dr Marius Ditch.  Patient stated that she already pick up her prep solution.  New instructions will be sent.  Patient verbalized understanding.

## 2022-12-11 ENCOUNTER — Emergency Department: Payer: HMO

## 2022-12-11 ENCOUNTER — Other Ambulatory Visit: Payer: Self-pay

## 2022-12-11 ENCOUNTER — Observation Stay
Admission: EM | Admit: 2022-12-11 | Discharge: 2022-12-13 | Disposition: A | Discharge status: Home / Self Care | Payer: HMO | Attending: Internal Medicine | Admitting: Internal Medicine

## 2022-12-11 ENCOUNTER — Encounter: Payer: Self-pay | Admitting: Internal Medicine

## 2022-12-11 DIAGNOSIS — E876 Hypokalemia: Secondary | ICD-10-CM | POA: Diagnosis not present

## 2022-12-11 DIAGNOSIS — Z23 Encounter for immunization: Secondary | ICD-10-CM | POA: Insufficient documentation

## 2022-12-11 DIAGNOSIS — Z85828 Personal history of other malignant neoplasm of skin: Secondary | ICD-10-CM | POA: Insufficient documentation

## 2022-12-11 DIAGNOSIS — Z8616 Personal history of COVID-19: Secondary | ICD-10-CM | POA: Insufficient documentation

## 2022-12-11 DIAGNOSIS — R252 Cramp and spasm: Secondary | ICD-10-CM | POA: Insufficient documentation

## 2022-12-11 DIAGNOSIS — Z9049 Acquired absence of other specified parts of digestive tract: Secondary | ICD-10-CM | POA: Insufficient documentation

## 2022-12-11 DIAGNOSIS — R202 Paresthesia of skin: Secondary | ICD-10-CM | POA: Diagnosis not present

## 2022-12-11 DIAGNOSIS — R197 Diarrhea, unspecified: Secondary | ICD-10-CM | POA: Diagnosis not present

## 2022-12-11 DIAGNOSIS — Z79899 Other long term (current) drug therapy: Secondary | ICD-10-CM | POA: Insufficient documentation

## 2022-12-11 DIAGNOSIS — Z87891 Personal history of nicotine dependence: Secondary | ICD-10-CM | POA: Diagnosis not present

## 2022-12-11 DIAGNOSIS — K5090 Crohn's disease, unspecified, without complications: Secondary | ICD-10-CM | POA: Diagnosis present

## 2022-12-11 DIAGNOSIS — I10 Essential (primary) hypertension: Secondary | ICD-10-CM | POA: Diagnosis present

## 2022-12-11 DIAGNOSIS — R531 Weakness: Secondary | ICD-10-CM | POA: Diagnosis not present

## 2022-12-11 DIAGNOSIS — R200 Anesthesia of skin: Secondary | ICD-10-CM | POA: Diagnosis not present

## 2022-12-11 LAB — DIFFERENTIAL
Abs Immature Granulocytes: 0.03 10*3/uL (ref 0.00–0.07)
Basophils Absolute: 0.1 10*3/uL (ref 0.0–0.1)
Basophils Relative: 1 %
Eosinophils Absolute: 0.1 10*3/uL (ref 0.0–0.5)
Eosinophils Relative: 1 %
Immature Granulocytes: 0 %
Lymphocytes Relative: 22 %
Lymphs Abs: 2 10*3/uL (ref 0.7–4.0)
Monocytes Absolute: 0.6 10*3/uL (ref 0.1–1.0)
Monocytes Relative: 6 %
Neutro Abs: 6.6 10*3/uL (ref 1.7–7.7)
Neutrophils Relative %: 70 %

## 2022-12-11 LAB — CBC
HCT: 35.1 % — ABNORMAL LOW (ref 36.0–46.0)
Hemoglobin: 11.7 g/dL — ABNORMAL LOW (ref 12.0–15.0)
MCH: 32.5 pg (ref 26.0–34.0)
MCHC: 33.3 g/dL (ref 30.0–36.0)
MCV: 97.5 fL (ref 80.0–100.0)
Platelets: 311 10*3/uL (ref 150–400)
RBC: 3.6 MIL/uL — ABNORMAL LOW (ref 3.87–5.11)
RDW: 16 % — ABNORMAL HIGH (ref 11.5–15.5)
WBC: 9.3 10*3/uL (ref 4.0–10.5)
nRBC: 0 % (ref 0.0–0.2)

## 2022-12-11 LAB — COMPREHENSIVE METABOLIC PANEL
ALT: 24 U/L (ref 0–44)
AST: 33 U/L (ref 15–41)
Albumin: 3.8 g/dL (ref 3.5–5.0)
Alkaline Phosphatase: 59 U/L (ref 38–126)
Anion gap: 15 (ref 5–15)
BUN: 14 mg/dL (ref 8–23)
CO2: 26 mmol/L (ref 22–32)
Calcium: 7.9 mg/dL — ABNORMAL LOW (ref 8.9–10.3)
Chloride: 98 mmol/L (ref 98–111)
Creatinine, Ser: 0.86 mg/dL (ref 0.44–1.00)
GFR, Estimated: >60 mL/min (ref >60)
Glucose, Bld: 130 mg/dL — ABNORMAL HIGH (ref 70–99)
Potassium: 2.2 mmol/L — CL (ref 3.5–5.1)
Sodium: 139 mmol/L (ref 135–145)
Total Bilirubin: 1 mg/dL (ref 0.3–1.2)
Total Protein: 7.2 g/dL (ref 6.5–8.1)

## 2022-12-11 LAB — APTT: aPTT: 27 seconds (ref 24–36)

## 2022-12-11 LAB — PROTIME-INR
INR: 1 (ref 0.8–1.2)
Prothrombin Time: 12.7 seconds (ref 11.4–15.2)

## 2022-12-11 LAB — ETHANOL: Alcohol, Ethyl (B): <10 mg/dL (ref <10)

## 2022-12-11 LAB — MAGNESIUM: Magnesium: 0.8 mg/dL — CL (ref 1.7–2.4)

## 2022-12-11 MED ORDER — ACETAMINOPHEN 325 MG PO TABS
650.0000 mg | ORAL_TABLET | Freq: Four times a day (QID) | ORAL | Status: DC | PRN
  Administered 2022-12-11: 650 mg via ORAL
  Filled 2022-12-11: qty 2

## 2022-12-11 MED ORDER — POTASSIUM CHLORIDE CRYS ER 20 MEQ PO TBCR
40.0000 meq | EXTENDED_RELEASE_TABLET | Freq: Two times a day (BID) | ORAL | Status: AC
  Administered 2022-12-11 – 2022-12-12 (×2): 40 meq via ORAL
  Filled 2022-12-11 (×2): qty 2

## 2022-12-11 MED ORDER — POTASSIUM CHLORIDE 10 MEQ/100ML IV SOLN
10.0000 meq | Freq: Once | INTRAVENOUS | Status: DC
  Filled 2022-12-11: qty 100

## 2022-12-11 MED ORDER — ONDANSETRON HCL 4 MG/2ML IJ SOLN: 4.0000 mg | Freq: Four times a day (QID) | INTRAMUSCULAR | Status: DC | PRN

## 2022-12-11 MED ORDER — ONDANSETRON HCL 4 MG PO TABS: 4.0000 mg | ORAL_TABLET | Freq: Four times a day (QID) | ORAL | Status: DC | PRN

## 2022-12-11 MED ORDER — POTASSIUM CHLORIDE IN NACL 40-0.9 MEQ/L-% IV SOLN
INTRAVENOUS | Status: AC
  Filled 2022-12-11: qty 1000

## 2022-12-11 MED ORDER — PANTOPRAZOLE SODIUM 40 MG PO TBEC
40.0000 mg | DELAYED_RELEASE_TABLET | Freq: Every day | ORAL | Status: DC
  Administered 2022-12-12 – 2022-12-13 (×2): 40 mg via ORAL
  Filled 2022-12-11 (×2): qty 1

## 2022-12-11 MED ORDER — FOLIC ACID 1 MG PO TABS
1000.0000 ug | ORAL_TABLET | Freq: Every day | ORAL | Status: DC
  Administered 2022-12-11 – 2022-12-13 (×3): 1 mg via ORAL
  Filled 2022-12-11 (×3): qty 1

## 2022-12-11 MED ORDER — VITAMIN D 25 MCG (1000 UNIT) PO TABS
2000.0000 [IU] | ORAL_TABLET | Freq: Every day | ORAL | Status: DC
  Administered 2022-12-12 – 2022-12-13 (×2): 2000 [IU] via ORAL
  Filled 2022-12-11 (×2): qty 2

## 2022-12-11 MED ORDER — MAGNESIUM SULFATE 4 GM/100ML IV SOLN
4.0000 g | Freq: Once | INTRAVENOUS | Status: AC
  Administered 2022-12-11: 4 g via INTRAVENOUS
  Filled 2022-12-11: qty 100

## 2022-12-11 MED ORDER — ENOXAPARIN SODIUM 40 MG/0.4ML IJ SOSY
40.0000 mg | PREFILLED_SYRINGE | INTRAMUSCULAR | Status: DC
  Administered 2022-12-11 – 2022-12-12 (×2): 40 mg via SUBCUTANEOUS
  Filled 2022-12-11 (×2): qty 0.4

## 2022-12-11 MED ORDER — INFLUENZA VAC A&B SA ADJ QUAD 0.5 ML IM PRSY
0.5000 mL | PREFILLED_SYRINGE | INTRAMUSCULAR | Status: AC
  Administered 2022-12-12: 0.5 mL via INTRAMUSCULAR
  Filled 2022-12-11: qty 0.5

## 2022-12-11 MED ORDER — CALCIUM GLUCONATE-NACL 1-0.675 GM/50ML-% IV SOLN
1.0000 g | Freq: Once | INTRAVENOUS | Status: AC
  Administered 2022-12-11: 1000 mg via INTRAVENOUS
  Filled 2022-12-11 (×2): qty 50

## 2022-12-11 MED ORDER — LACTATED RINGERS IV BOLUS
1000.0000 mL | Freq: Once | INTRAVENOUS | Status: AC
  Administered 2022-12-11: 1000 mL via INTRAVENOUS

## 2022-12-11 MED ORDER — LINACLOTIDE 72 MCG PO CAPS: 72.0000 ug | ORAL_CAPSULE | Freq: Every day | ORAL | Status: DC

## 2022-12-11 MED ORDER — FERROUS SULFATE 325 (65 FE) MG PO TABS
325.0000 mg | ORAL_TABLET | ORAL | Status: DC
  Administered 2022-12-11: 325 mg via ORAL
  Filled 2022-12-11 (×3): qty 1

## 2022-12-11 MED ORDER — BUDESONIDE 3 MG PO CPEP
3.0000 mg | ORAL_CAPSULE | Freq: Every day | ORAL | Status: DC
  Administered 2022-12-12 – 2022-12-13 (×2): 3 mg via ORAL
  Filled 2022-12-11 (×2): qty 1

## 2022-12-11 MED ORDER — ACETAMINOPHEN 650 MG RE SUPP: 650.0000 mg | Freq: Four times a day (QID) | RECTAL | Status: DC | PRN

## 2022-12-11 MED ORDER — POTASSIUM CHLORIDE CRYS ER 20 MEQ PO TBCR
40.0000 meq | EXTENDED_RELEASE_TABLET | Freq: Once | ORAL | Status: AC
  Administered 2022-12-11: 40 meq via ORAL
  Filled 2022-12-11: qty 2

## 2022-12-11 MED ORDER — MELATONIN 5 MG PO TABS
5.0000 mg | ORAL_TABLET | Freq: Every day | ORAL | Status: DC
  Administered 2022-12-11 – 2022-12-12 (×2): 5 mg via ORAL
  Filled 2022-12-11 (×2): qty 1

## 2022-12-11 MED ORDER — AMLODIPINE BESYLATE 5 MG PO TABS
5.0000 mg | ORAL_TABLET | Freq: Every day | ORAL | Status: DC
  Administered 2022-12-11 – 2022-12-13 (×3): 5 mg via ORAL
  Filled 2022-12-11 (×4): qty 1

## 2022-12-11 NOTE — H&P (Signed)
History and Physical    Patient: Michelle Vasquez [id number] DOB: [birth year] DOA: 12/11/2022 DOS: the patient was seen and examined on 12/11/2022 PCP: [name], MD  Patient coming from: Home  Chief Complaint:  Chief Complaint  Patient presents with   Numbness   HPI: Michelle Vasquez is a 72 y.o. female with medical history significant for Crohn's disease, anxiety and depression, hypertension who presents to the ER for evaluation of tingling sensation involving her arms and face.  Patient states that she was in her usual state of health until this morning when she woke up with tingling and numbness involving her fingers and face. She has a history of Crohn's disease and has had diarrhea for several days. She took Imodium one day prior to her admission with improvement in her diarrhea. She denies having any nausea or vomiting, no abdominal pain, no headache, no fever, no lightheadedness, no dizziness, no urinary symptoms, no fever, no chills, no blurred vision, no focal deficits or blurred vision.   Abnormal labs include magnesium 0.8, potassium 2.2, CT scan of the head without contrast shows no acute findings. Twelve-lead EKG reviewed by me shows sinus rhythm with nonspecific ST abnormality Patient received oral potassium and will be admitted to the hospital for further evaluation    Review of Systems: As mentioned in the history of present illness. All other systems reviewed and are negative. Past Medical History:  Diagnosis Date   Allergy    seasonal   Anxiety and depression 07/03/2011   B12 deficiency    Blood transfusion without reported diagnosis    Central hearing loss    COVID-19 virus infection 03/03/2021   Crohn disease ([place])    s/p colectomy on 6MP, ?bacterial overgrowth   Fatty liver 12/15/2011   History of small bowel obstruction    HTN (hypertension)    Internal hemorrhoids without mention of complication    Osteomalacia, unspecified    Perennial allergic  rhinitis with seasonal variation    Squamous cell cancer of skin of left hand 11/2022   Dr Veleta Miners   Unspecified deficiency anemia    Unspecified polyarthropathy or polyarthritis, multiple sites    Past Surgical History:  Procedure Laterality Date   APPENDECTOMY     COLONOSCOPY  02/25/05   Internal hemms; crohns; narrow anast   COLONOSCOPY  02/26/10   Prior right hemicolectomy o/w benign (Dr. Olevia Perches)   COLONOSCOPY  02/2015   benign biopsies, crohn's with stenotic stricture at anastomosis Olevia Perches)   COLONOSCOPY  12/2019   small adenomas, crohn's disease small intestine, diverticulosis, non-patent end to side ileocolonic anastomosis with inflammation and ulceration biopsied, rpt 3 yrs (Brahmbhatt)   DEXA  07/06/2011   normal T score -1.0 femur, spine   ESOPHAGOGASTRODUODENOSCOPY  02/25/05   Dilated stricture   ESOPHAGOGASTRODUODENOSCOPY  10/2017   esophageal stenosis dilated, duodenal diverticuluc (Nandigam)   HEMICOLECTOMY  1980   all small intestine, none large   OTHER SURGICAL HISTORY  1960s, 1978, 1980   ileal resection x 3 and subtotal colon resection   TOTAL ABDOMINAL HYSTERECTOMY  1975   ovaries out as well   UPPER GASTROINTESTINAL ENDOSCOPY     Social History:  reports that she quit smoking about 44 years ago. Her smoking use included cigarettes. She has a 3.75 pack-year smoking history. She has never used smokeless tobacco. She reports that she does not drink alcohol and does not use drugs.  Allergies  Allergen Reactions   Celexa [Citalopram Hydrobromide] Other (See Comments)  Racing thoughts, couldn't sleep    Family History  Problem Relation Age of Onset   Crohn's disease Father        colostomy   Hypertension Mother    Heart disease Mother 73   Alcohol abuse Brother    Cirrhosis Brother    Crohn's disease Sister    Crohn's disease Paternal Aunt        x 3   Crohn's disease Other        neice x 2   Colon cancer Neg Hx    Cancer Neg Hx    Diabetes Neg Hx     Rectal cancer Neg Hx    Stomach cancer Neg Hx    Esophageal cancer Neg Hx    Breast cancer Neg Hx     Prior to Admission medications   Medication Sig Start Date End Date Taking? Authorizing Provider  amLODipine (NORVASC) 5 MG tablet TAKE 1 TABLET BY MOUTH ONCE A DAY 06/05/22   [name], MD  budesonide (ENTOCORT EC) 3 MG 24 hr capsule 1 capsule 11/13/20   [provider]  cetirizine (ZYRTEC) 10 MG tablet TAKE ONE TABLET DAILY AS DIRECTED 08/05/17   [name], MD  Cholecalciferol (VITAMIN D) 50 MCG (2000 UT) CAPS Take 1 capsule (2,000 Units total) by mouth daily. 11/15/18   [name], MD  cyanocobalamin (VITAMIN B12) 1000 MCG/ML injection Inject 1 mL (1,000 mcg total) into the skin every 30 (thirty) days. 07/30/22   [name], MD  ferrous sulfate (FEROSUL) 325 (65 FE) MG tablet Take 1 tablet (325 mg total) by mouth every Monday, Wednesday, and Friday. 11/25/20   [name], MD  folic acid (FOLVITE) Q000111Q MCG tablet Take 1 tablet (800 mcg total) by mouth daily. 04/25/21   [name], MD  LINZESS 72 MCG capsule TAKE 1 CAPSULE BY MOUTH ONCE DAILY BEFORE BREAKFAST Patient taking differently: As needed 01/02/20   Mauri Pole, MD  Melatonin 10 MG TABS Take 5 mg by mouth at bedtime. 04/25/21   [name], MD  mercaptopurine (PURINETHOL) 50 MG tablet TAKE 1 TABLET BY MOUTH DAILY 1 HOUR BEFORE OR 2 HOURS AFTER MEALS ON AN EMPTY STOMACH. 07/25/18   Mauri Pole, MD  pantoprazole (PROTONIX) 40 MG tablet Take 1 tablet by mouth daily. 03/02/19   [provider]  Potassium 99 MG TABS Take 1 tablet (99 mg total) by mouth in the morning and at bedtime. 10/07/22   [name], MD  SYRINGE/NEEDLE, DISP, 1 ML 23G X 1" 1 ML MISC Use as directed 07/31/22   [name], MD    Physical Exam: Vitals:   12/11/22 1215 12/11/22 1217  BP: (!) 163/73   Pulse: 92   Resp: 16   Temp: 98.4 F (36.9 C)   TempSrc: Oral   SpO2: 97%    Weight:  49 kg  Height:  4' 11"$  (1.499 m)   Physical Exam Vitals and nursing note reviewed.  Constitutional:      Appearance: Normal appearance.  HENT:     Head: Normocephalic.     Nose: Nose normal.     Mouth/Throat:     Mouth: Mucous membranes are dry.  Eyes:     Conjunctiva/sclera: Conjunctivae normal.  Cardiovascular:     Rate and Rhythm: Normal rate and regular rhythm.  Pulmonary:     Effort: Pulmonary effort is normal.     Breath sounds: Normal breath sounds.  Abdominal:     General: Abdomen is flat. Bowel  sounds are normal.     Palpations: Abdomen is soft.  Musculoskeletal:        General: Normal range of motion.     Cervical back: Normal range of motion and neck supple.  Skin:    General: Skin is warm and dry.  Neurological:     General: No focal deficit present.     Mental Status: She is alert and oriented to person, place, and time.  Psychiatric:        Mood and Affect: Mood normal.        Behavior: Behavior normal.     Data Reviewed:  Relevant notes from primary care and specialist visits, past discharge summaries as available in EHR, including Care Everywhere. Prior diagnostic testing as pertinent to current admission diagnoses Updated medications and problem lists for reconciliation ED course, including vitals, labs, imaging, treatment and response to treatment Triage notes, nursing and pharmacy notes and ED provider's notes Notable results as noted in HPI Labs reviewed.  Magnesium 0.8, potassium 2.2, calcium 7.9, PT 12.7, INR 1.0, white count 9.3, hemoglobin 11.7, hematocrit 35.1, platelet count 311 There are no new results to review at this time.  Assessment and Plan: * Weakness generalized Patient presents for evaluation of generalized weakness most likely related to electrolyte abnormalities. She has severe hypokalemia, hypomagnesemia and hypocalcemia Expect improvement in patient's weakness following resolution of electrolyte abnormalities and  hydration  Hypocalcemia Supplement calcium  Hypomagnesemia Secondary to GI losses from diarrhea Supplement magnesium  Hypokalemia due to excessive gastrointestinal loss of potassium Secondary to GI losses from diarrhea Patient presents for evaluation of weakness, numbness and tingling in her arms and face Supplement potassium and recheck levels in a.m.  Crohn's disease ([place]) Patient has a known history of Crohn's disease and had a recent flareup with several episodes of diarrhea. Continue Entocort  Essential hypertension Continue amlodipine      Advance Care Planning:   Code Status: Full Code   Consults: None  Family Communication: Greater than 50% of time was spent discussing patient's condition and plan of care with her at the bedside.  All questions and concerns have been addressed.  She verbalizes understanding and agrees with the plan  Severity of Illness: The appropriate patient status for this patient is INPATIENT. Inpatient status is judged to be reasonable and necessary in order to provide the required intensity of service to ensure the patient's safety. The patient's presenting symptoms, physical exam findings, and initial radiographic and laboratory data in the context of their chronic comorbidities is felt to place them at high risk for further clinical deterioration. Furthermore, it is not anticipated that the patient will be medically stable for discharge from the hospital within 2 midnights of admission.   * I certify that at the point of admission it is my clinical judgment that the patient will require inpatient hospital care spanning beyond 2 midnights from the point of admission due to high intensity of service, high risk for further deterioration and high frequency of surveillance required.*  Author: Collier Bullock, MD 12/11/2022 3:21 PM  For on call review [email or web address].

## 2022-12-11 NOTE — Assessment & Plan Note (Signed)
-   Continue amlodipine ?

## 2022-12-11 NOTE — Assessment & Plan Note (Signed)
Secondary to GI losses from diarrhea Supplement magnesium

## 2022-12-11 NOTE — ED Notes (Signed)
MD Pad informed of K+ of 2.2

## 2022-12-11 NOTE — ED Triage Notes (Addendum)
Pt sts that she has been having numbness and tingling in both arms and face. Pt sts that she woke up to this. Pt does not take any blood thinners. Pt last known well was at 0400 this AM when she went to the bathroom.

## 2022-12-11 NOTE — Assessment & Plan Note (Signed)
Patient presents for evaluation of generalized weakness most likely related to electrolyte abnormalities. She has severe hypokalemia, hypomagnesemia and hypocalcemia Expect improvement in patient's weakness following resolution of electrolyte abnormalities and hydration

## 2022-12-11 NOTE — ED Provider Notes (Signed)
[facility] Provider Note    Event Date/Time   First MD Initiated Contact with Patient 12/11/22 1347     (approximate)  History   Chief Complaint: Numbness  HPI  Michelle Vasquez is a 72 y.o. female with a past medical history of Crohn's disease, hypertension, presents emergency department for numbness and tingling in her arms weakness and muscle cramps throughout her body.  According to the patient for the past 4 days or so she has been experiencing a diarrheal episode/flare.  Finally took Imodium yesterday which seems to have calm down the diarrhea.  Today she noticed tingling and weakness throughout her body but mostly in her upper extremities.  States over the last 2 days she has been experiencing significant muscle cramps mostly in the legs.  Patient takes potassium supplements over-the-counter.  Denies any vomiting.  No fever.  Physical Exam   Triage Vital Signs: ED Triage Vitals  Enc Vitals Group     BP 12/11/22 1215 (!) 163/73     Pulse Rate 12/11/22 1215 92     Resp 12/11/22 1215 16     Temp 12/11/22 1215 98.4 F (36.9 C)     Temp Source 12/11/22 1215 Oral     SpO2 12/11/22 1215 97 %     Weight 12/11/22 1217 108 lb (49 kg)     Height 12/11/22 1217 4' 11"$  (1.499 m)     Head Circumference --      Peak Flow --      Pain Score --      Pain Loc --      Pain Edu? --      Excl. in [place]? --     Most recent vital signs: Vitals:   12/11/22 1215  BP: (!) 163/73  Pulse: 92  Resp: 16  Temp: 98.4 F (36.9 C)  SpO2: 97%    General: Awake, no distress.  CV:  Good peripheral perfusion.  Regular rate and rhythm  Resp:  Normal effort.  Equal breath sounds bilaterally.  Abd:  No distention.  Soft, nontender.  No rebound or guarding. Other:  5/5 motor in extremities, equal grip strengths.   ED Results / Procedures / Treatments   EKG  EKG viewed and interpreted by myself shows normal sinus rhythm at 72 bpm with a narrow QRS, normal axis, normal  intervals, no concerning ST changes.  RADIOLOGY  I have reviewed and interpreted the CT head images.  No bleed or mass seen on my evaluation. Radiology has read the CT scan as negative for acute abnormality.   MEDICATIONS ORDERED IN ED: Medications  potassium chloride SA (KLOR-CON M) CR tablet 40 mEq (has no administration in time range)  potassium chloride 10 mEq in 100 mL IVPB (has no administration in time range)  lactated ringers bolus 1,000 mL (has no administration in time range)     IMPRESSION / MDM / ASSESSMENT AND PLAN / ED COURSE  I reviewed the triage vital signs and the nursing notes.  Patient's presentation is most consistent with acute presentation with potential threat to life or bodily function.  Patient presents to the emergency department for generalized weakness and tingling/paresthesias sensation as well as muscle cramps.  Patient has Crohn's disease states he has been experiencing diarrhea over the past 4 days with abdominal cramping but no pain.  States that is largely resolved since taking Imodium yesterday.  Patient is lab work today shows significant hypokalemia with a potassium of 2.2 remainder the  chemistry largely within normal limits besides dehydration with anion gap of 15.  Will start the patient on IV and oral potassium we will also dose 1 L of lactated Ringer's.  We will check a magnesium level and an EKG.  Patient CBC is normal INR is normal ethanol is normal CT scan of the head has resulted showing normal results as well.  Highly suspect the patient's symptoms to be due to to severe hypokalemia.  Patient will require admission to the hospital service once her emergency department workup is completed.  FINAL CLINICAL IMPRESSION(S) / ED DIAGNOSES   Weakness Hypokalemia   Note:  This document was prepared using Dragon voice recognition software and may include unintentional dictation errors.   Harvest Dark, MD 12/11/22 1940

## 2022-12-11 NOTE — Assessment & Plan Note (Addendum)
Secondary to GI losses from diarrhea Patient presents for evaluation of weakness, numbness and tingling in her arms and face Supplement potassium and recheck levels in a.m.

## 2022-12-11 NOTE — Assessment & Plan Note (Signed)
Patient has a known history of Crohn's disease and had a recent flareup with several episodes of diarrhea. Continue Entocort

## 2022-12-11 NOTE — Assessment & Plan Note (Signed)
Supplement calcium

## 2022-12-12 DIAGNOSIS — K50118 Crohn's disease of large intestine with other complication: Secondary | ICD-10-CM | POA: Diagnosis not present

## 2022-12-12 DIAGNOSIS — E876 Hypokalemia: Secondary | ICD-10-CM | POA: Diagnosis not present

## 2022-12-12 DIAGNOSIS — R531 Weakness: Secondary | ICD-10-CM | POA: Diagnosis not present

## 2022-12-12 LAB — CBC
HCT: 33.7 % — ABNORMAL LOW (ref 36.0–46.0)
Hemoglobin: 11.3 g/dL — ABNORMAL LOW (ref 12.0–15.0)
MCH: 32.6 pg (ref 26.0–34.0)
MCHC: 33.5 g/dL (ref 30.0–36.0)
MCV: 97.1 fL (ref 80.0–100.0)
Platelets: 271 10*3/uL (ref 150–400)
RBC: 3.47 MIL/uL — ABNORMAL LOW (ref 3.87–5.11)
RDW: 16.1 % — ABNORMAL HIGH (ref 11.5–15.5)
WBC: 6.8 10*3/uL (ref 4.0–10.5)
nRBC: 0 % (ref 0.0–0.2)

## 2022-12-12 LAB — BASIC METABOLIC PANEL
Anion gap: 7 (ref 5–15)
BUN: 8 mg/dL (ref 8–23)
CO2: 27 mmol/L (ref 22–32)
Calcium: 8.3 mg/dL — ABNORMAL LOW (ref 8.9–10.3)
Chloride: 107 mmol/L (ref 98–111)
Creatinine, Ser: 0.67 mg/dL (ref 0.44–1.00)
GFR, Estimated: >60 mL/min (ref >60)
Glucose, Bld: 87 mg/dL (ref 70–99)
Potassium: 3.6 mmol/L (ref 3.5–5.1)
Sodium: 141 mmol/L (ref 135–145)

## 2022-12-12 LAB — MAGNESIUM: Magnesium: 1.9 mg/dL (ref 1.7–2.4)

## 2022-12-12 NOTE — Progress Notes (Signed)
  Progress Note   Patient: Michelle Vasquez [id number] DOB: 1951-01-26 DOA: 12/11/2022     0 DOS: the patient was seen and examined on 12/12/2022   Brief hospital course:  Michelle Vasquez is a 72 y.o. female with medical history significant for Crohn's disease, anxiety and depression, hypertension who presents to the ER for evaluation of tingling sensation involving her arms and face.  Patient states that she was in her usual state of health until this morning when she woke up with tingling and numbness involving her fingers and face. She has a history of Crohn's disease and has had diarrhea for several days. She took Imodium one day prior to her admission with improvement in her diarrhea. She denies having any nausea or vomiting, no abdominal pain, no headache, no fever, no lightheadedness, no dizziness, no urinary symptoms, no fever, no chills, no blurred vision, no focal deficits or blurred vision.   Abnormal labs include magnesium 0.8, potassium 2.2, CT scan of the head without contrast shows no acute findings. Twelve-lead EKG reviewed by me shows sinus rhythm with nonspecific ST abnormality Patient received oral potassium and will be admitted to the hospital for further evaluation  2/17 : Marked improvement in symptoms after repletion of electrolytes.  Electrolytes back to baseline.  Will continue to monitor with neurochecks for the next 24 hours prior to discharge.  Assessment and Plan: * Weakness generalized Patient presents for evaluation of generalized weakness most likely related to electrolyte abnormalities. She has severe hypokalemia, hypomagnesemia and hypocalcemia Expect improvement in patient's weakness following resolution of electrolyte abnormalities and hydration  Hypocalcemia Supplement calcium  Hypomagnesemia -resolved Secondary to GI losses from diarrhea Supplement magnesium  Hypokalemia due to excessive gastrointestinal loss of potassium-resolved Secondary to GI  losses from diarrhea Patient presents for evaluation of weakness, numbness and tingling in her arms and face Supplement potassium and recheck levels in a.m.  Crohn's disease (Michelle Vasquez) Patient has a known history of Crohn's disease and had a recent flareup with several episodes of diarrhea. Continue Entocort  Essential hypertension Continue amlodipine        Subjective: Patient seen and examined this morning.  Standing by bedside.  No overnight event.  Feeling better.  Labs vitals and imaging reviewed.  Physical Exam: Vitals:   12/12/22 0052 12/12/22 0444 12/12/22 0818 12/12/22 1226  BP: (!) 125/52 115/88 131/61 134/68  Pulse: 71 79 81 81  Resp: 16 16 16 16  $ Temp: 98.4 F (36.9 C) 98.2 F (36.8 C) (!) 97.2 F (36.2 C) 98.4 F (36.9 C)  TempSrc: Oral Oral Axillary   SpO2: 93% 99% 97% 99%  Weight:      Height:        Data Reviewed:  There are no new results to review at this time.  Family Communication: None   Disposition: Status is: Observation The patient will require care spanning > 2 midnights and should be moved to inpatient because: Weakness and electrolyte arnomality.    Planned Discharge Destination: Home    Time spent: 32 minutes  Author: Oran Rein, MD 12/12/2022 1:32 PM  For on call review [email or web address].

## 2022-12-12 NOTE — Progress Notes (Signed)
Mobility Specialist - Progress Note   12/12/22 0932  Mobility  Activity Ambulated independently in hallway  Level of Assistance Independent  Assistive Device None  Distance Ambulated (ft) 120 ft  Activity Response Tolerated well  Mobility Referral Yes  $Mobility charge 1 Mobility   Pt supine in bed on RA upon arrival. Pt completes bed mobility, STS, and ambulates in hallway indep. Pt returns to bed with needs in reach.   Gretchen Short  Mobility Specialist  12/12/22 9:33 AM

## 2022-12-13 MED ORDER — MAGNESIUM GLUCONATE 500 MG PO TABS
500.0000 mg | ORAL_TABLET | Freq: Every day | ORAL | Status: DC
  Administered 2022-12-13: 500 mg via ORAL
  Filled 2022-12-13: qty 1

## 2022-12-13 MED ORDER — POTASSIUM 99 MG PO TABS: 1.0000 | ORAL_TABLET | Freq: Every day | ORAL | 0 refills | Status: DC

## 2022-12-13 MED ORDER — MAGNESIUM GLUCONATE 500 MG PO TABS: 500.0000 mg | ORAL_TABLET | Freq: Every day | ORAL | 1 refills | Status: DC

## 2022-12-13 NOTE — Progress Notes (Signed)
Mobility Specialist - Progress Note   12/13/22 1048  Mobility  Activity Ambulated independently in hallway  Level of Assistance Independent  Assistive Device None  Distance Ambulated (ft) 400 ft  Activity Response Tolerated well  Mobility Referral Yes  $Mobility charge 1 Mobility   Pt OOB on RA upon arrival. Pt ambulates in hallway indep with no LOB noted. Pt returns to EOB with needs in reach.   Gretchen Short  Mobility Specialist  12/13/22 10:49 AM

## 2022-12-13 NOTE — Discharge Summary (Signed)
Physician Discharge Summary   Patient: Michelle Vasquez MRN: [id number] DOB: August 15, 1951  Admit date:     12/11/2022  Discharge date: 12/13/22  Discharge Physician: Oran Rein   PCP: [name], MD   Recommendations at discharge:    Follow up with PCP in 2 weeks  Discharge Diagnoses: Principal Problem:   Weakness generalized Active Problems:   Essential hypertension   Crohn's disease ([place])   Hypokalemia due to excessive gastrointestinal loss of potassium   Hypomagnesemia   Hypocalcemia  Resolved Problems:   * No resolved hospital problems. *  Hospital Course:  Michelle Vasquez is a 72 y.o. female with medical history significant for Crohn's disease, anxiety and depression, hypertension who presents to the ER for evaluation of tingling sensation involving her arms and face.  Patient states that she was in her usual state of health until this morning when she woke up with tingling and numbness involving her fingers and face. She has a history of Crohn's disease and has had diarrhea for several days. She took Imodium one day prior to her admission with improvement in her diarrhea. She denies having any nausea or vomiting, no abdominal pain, no headache, no fever, no lightheadedness, no dizziness, no urinary symptoms, no fever, no chills, no blurred vision, no focal deficits or blurred vision.   Abnormal labs include magnesium 0.8, potassium 2.2, CT scan of the head without contrast shows no acute findings. Twelve-lead EKG reviewed by me shows sinus rhythm with nonspecific ST abnormality Patient received oral potassium and will be admitted to the hospital for further evaluation   2/17 : Marked improvement in symptoms after repletion of electrolytes.  Electrolytes back to baseline.  Will continue to monitor with neurochecks for the next 24 hours prior to discharge.  2/18 : Patient seen and examined this morning.  No overnight events.  Asymptomatic for presenting symptoms.   Vital labs and imaging reviewed.  Patient is being discharged home.  Concern for upcoming colonoscopy as it would deplete existing electrolyte derangement.  Follow-up with GI for the above concern.  Assessment and Plan: * Weakness generalized Patient presents for evaluation of generalized weakness most likely related to electrolyte abnormalities. She has severe hypokalemia, hypomagnesemia and hypocalcemia Expect improvement in patient's weakness following resolution of electrolyte abnormalities and hydration -Electrolytes were repleted patient symptomatically improved no further diagnostic testing needed.  Electro depletion likely secondary to ongoing Crohn disease/diarrhea.  Start the patient on oral supplement of magnesium and potassium.  Follow-up with PCP for repeat check of electrolytes. Hypocalcemia Supplement calcium  Hypomagnesemia Secondary to GI losses from diarrhea Supplement magnesium  Hypokalemia due to excessive gastrointestinal loss of potassium Secondary to GI losses from diarrhea Patient presents for evaluation of weakness, numbness and tingling in her arms and face Supplement potassium and recheck levels in a.m.  Crohn's disease ([place]) Patient has a known history of Crohn's disease and had a recent flareup with several episodes of diarrhea. Continue Entocort  Essential hypertension Continue amlodipine     Pain control - [place] Controlled Substance Reporting System database was reviewed. and patient was instructed, not to drive, operate heavy machinery, perform activities at heights, swimming or participation in water activities or provide baby-sitting services while on Pain, Sleep and Anxiety Medications; until their outpatient Physician has advised to do so again. Also recommended to not to take more than prescribed Pain, Sleep and Anxiety Medications.  Consultants: None  Procedures performed:  Disposition: Home Diet recommendation:  Discharge Diet Orders  (From  admission, onward)     Start     Ordered   12/13/22 0000  Diet - low sodium heart healthy        12/13/22 1043   12/13/22 0000  Diet - low sodium heart healthy        12/13/22 1046           Regular diet DISCHARGE MEDICATION: Allergies as of 12/13/2022       Reactions   Celexa [citalopram Hydrobromide] Other (See Comments)   Racing thoughts, couldn't sleep        Medication List     TAKE these medications    amLODipine 5 MG tablet Commonly known as: NORVASC TAKE 1 TABLET BY MOUTH ONCE A DAY   budesonide 3 MG 24 hr capsule Commonly known as: ENTOCORT EC Take 3 mg by mouth daily.   cetirizine 10 MG tablet Commonly known as: ZYRTEC TAKE ONE TABLET DAILY AS DIRECTED   cyanocobalamin 1000 MCG/ML injection Commonly known as: VITAMIN B12 Inject 1 mL (1,000 mcg total) into the skin every 30 (thirty) days.   ferrous sulfate 325 (65 FE) MG tablet Commonly known as: FeroSul Take 1 tablet (325 mg total) by mouth every Monday, Wednesday, and Friday.   folic acid Q000111Q MCG tablet Commonly known as: FOLVITE Take 1 tablet (800 mcg total) by mouth daily.   magnesium gluconate 500 MG tablet Commonly known as: MAGONATE Take 1 tablet (500 mg total) by mouth daily.   mercaptopurine 50 MG tablet Commonly known as: PURINETHOL TAKE 1 TABLET BY MOUTH DAILY 1 HOUR BEFORE OR 2 HOURS AFTER MEALS ON AN EMPTY STOMACH.   pantoprazole 40 MG tablet Commonly known as: PROTONIX Take 40 mg by mouth daily.   Potassium 99 MG Tabs Take 1 tablet (99 mg total) by mouth daily. What changed: when to take this   SYRINGE/NEEDLE (DISP) 1 ML 23G X 1" 1 ML Misc Use as directed   Vitamin D 50 MCG (2000 UT) Caps Take 1 capsule (2,000 Units total) by mouth daily.        Discharge Exam: Filed Weights   12/11/22 1217  Weight: 49 kg     Condition at discharge: good  The results of significant diagnostics from this hospitalization (including imaging, microbiology, ancillary and  laboratory) are listed below for reference.   Imaging Studies: CT HEAD WO CONTRAST  Result Date: 12/11/2022 CLINICAL DATA:  Facial and bilateral arm numbness and tingling. EXAM: CT HEAD WITHOUT CONTRAST TECHNIQUE: Contiguous axial images were obtained from the base of the skull through the vertex without intravenous contrast. RADIATION DOSE REDUCTION: This exam was performed according to the departmental dose-optimization program which includes automated exposure control, adjustment of the mA and/or kV according to patient size and/or use of iterative reconstruction technique. COMPARISON:  None Available. FINDINGS: Brain: No evidence of acute infarction, hemorrhage, hydrocephalus, extra-axial collection or mass lesion/mass effect. Vascular: Atherosclerotic vascular calcification of the carotid siphons. No hyperdense vessel. Skull: Normal. Negative for fracture or focal lesion. Sinuses/Orbits: No acute finding. Other: None. IMPRESSION: 1. No acute intracranial abnormality. Electronically Signed   By: Titus Dubin M.D.   On: 12/11/2022 13:33    Microbiology: Results for orders placed or performed in visit on 10/29/22  Calprotectin, Fecal     Status: None   Collection Time: 11/03/22  7:15 AM   Specimen: Stool  Result Value Ref Range Status   Calprotectin, Fecal 68 0 - 120 ug/g Final    Comment: Concentration  Interpretation   Follow-Up < 5 - 50 ug/g     Normal           None >50 -120 ug/g     Borderline       Re-evaluate in 4-6 weeks     >120 ug/g     Abnormal         Repeat as clinically                                    indicated     Labs: CBC: Recent Labs  Lab 12/11/22 1223 12/12/22 0639  WBC 9.3 6.8  NEUTROABS 6.6  --   HGB 11.7* 11.3*  HCT 35.1* 33.7*  MCV 97.5 97.1  PLT 311 [id number]   Basic Metabolic Panel: Recent Labs  Lab 12/11/22 1223 12/12/22 0639  NA 139 141  K 2.2* 3.6  CL 98 107  CO2 26 27  GLUCOSE 130* 87  BUN 14 8  CREATININE 0.86 0.67  CALCIUM 7.9* 8.3*   MG 0.8* 1.9   Liver Function Tests: Recent Labs  Lab 12/11/22 1223  AST 33  ALT 24  ALKPHOS 59  BILITOT 1.0  PROT 7.2  ALBUMIN 3.8   CBG: No results for input(s): "GLUCAP" in the last 168 hours.  Discharge time spent: less than 30 minutes.  Signed: Oran Rein, MD [facility] 12/13/2022

## 2022-12-14 ENCOUNTER — Telehealth: Payer: Self-pay

## 2022-12-14 ENCOUNTER — Telehealth: Payer: Self-pay | Admitting: Gastroenterology

## 2022-12-14 DIAGNOSIS — Z8719 Personal history of other diseases of the digestive system: Secondary | ICD-10-CM

## 2022-12-14 DIAGNOSIS — R197 Diarrhea, unspecified: Secondary | ICD-10-CM

## 2022-12-14 MED ORDER — RIFAXIMIN 550 MG PO TABS: 550.0000 mg | ORAL_TABLET | Freq: Three times a day (TID) | ORAL | 0 refills | Status: DC

## 2022-12-14 MED ORDER — RIFAXIMIN 550 MG PO TABS: 550.0000 mg | ORAL_TABLET | Freq: Three times a day (TID) | ORAL | 0 refills | Status: AC

## 2022-12-14 NOTE — Telephone Encounter (Signed)
Sent medication to the pharmacy for Michelle Vasquez. She will come for labs on 12/21/2022 to our office. She will keep the colonoscopy as schedule right now for 12/29/2022

## 2022-12-14 NOTE — Telephone Encounter (Signed)
Patient states she has a colonoscopy schedule for 12/29/2022 and she states she was in the hospital on 12/11/2022 through 12/13/2022 for weakness. She states she had low electrolytes and they recommend her to call her Gastroenterologist to see if she needed to still have the colonoscopy done and to see if she needed to do the prep that was called in for her. She was given  suprep

## 2022-12-14 NOTE — Addendum Note (Signed)
Addended by: Ulyess Blossom L on: 12/14/2022 01:12 PM   Modules accepted: Orders

## 2022-12-14 NOTE — Telephone Encounter (Signed)
Do you want her to keep her colonoscopy as schedule on 12/29/2022

## 2022-12-14 NOTE — Telephone Encounter (Signed)
[place] back and talk to [facility] and she states the medication comes in a bottle of 60 tablets and they do not break open the bottle because the bottle of medication is 3000 dollars. Informed pharmacy that insurance is not going to cover for 60 because she only needs 82 of them. She did a test claim and insurance paid for 60 tablets for a 11.20 copay. Asked Dr. Marius Ditch and she said she is okay with 60 tablets. Layden asked if we sent in a new script for the 60 tablets

## 2022-12-14 NOTE — Telephone Encounter (Signed)
[place] -[phone] in ref to pts medication  Xifaxan  only comes I 60 count pharmacist Layden would like a call back.

## 2022-12-14 NOTE — Addendum Note (Signed)
Addended by: Ulyess Blossom L on: 12/14/2022 11:28 AM   Modules accepted: Orders

## 2022-12-14 NOTE — Telephone Encounter (Signed)
Recommend to check serum cortisol levels, celiac disease panel Recommend xifaxan 594m TID for 2 weeks  Recheck BMP, Magnesium on 2/26 Take imodium as needed upto 4 times daily to control diarrhea  RV

## 2022-12-16 ENCOUNTER — Ambulatory Visit (INDEPENDENT_AMBULATORY_CARE_PROVIDER_SITE_OTHER): Payer: HMO | Admitting: Family Medicine

## 2022-12-16 ENCOUNTER — Encounter: Payer: Self-pay | Admitting: Family Medicine

## 2022-12-16 VITALS — BP 136/62 | HR 80 | Temp 97.2°F | Ht 59.0 in | Wt 109.2 lb

## 2022-12-16 DIAGNOSIS — E876 Hypokalemia: Secondary | ICD-10-CM

## 2022-12-16 DIAGNOSIS — R197 Diarrhea, unspecified: Secondary | ICD-10-CM | POA: Diagnosis not present

## 2022-12-16 DIAGNOSIS — M8588 Other specified disorders of bone density and structure, other site: Secondary | ICD-10-CM | POA: Diagnosis not present

## 2022-12-16 DIAGNOSIS — K50118 Crohn's disease of large intestine with other complication: Secondary | ICD-10-CM

## 2022-12-16 DIAGNOSIS — R011 Cardiac murmur, unspecified: Secondary | ICD-10-CM | POA: Diagnosis not present

## 2022-12-16 NOTE — Assessment & Plan Note (Addendum)
Has established with [facility] ([place]) pending colonoscopy.  She continues entocort and mercaptopurine.  She was recently started on Xifaxin antibiotic course.

## 2022-12-16 NOTE — Assessment & Plan Note (Signed)
Recent ER visit for paresthesias and weakness after crohn's diarrheal flare leading to hypocalcemia, hypokalemia, hypomagnesemia.  Electrolytes repleted, received IVF with significant improvement. She has labwork planned on Monday through GI prior to upcoming colonoscopy/endoscopy.  For now will continue magnesium 526m daily, potassium OTC 956mdaily, reviewed calcium intake (dietary and supplementation if needed) as per below.

## 2022-12-16 NOTE — Assessment & Plan Note (Signed)
Recent potassium repleted, has f/u labs planned next week.

## 2022-12-16 NOTE — Assessment & Plan Note (Signed)
Now on magnesium 566m daily.

## 2022-12-16 NOTE — Assessment & Plan Note (Signed)
Reviewed diagnosis with patient as well as need to continue vit D, calcium intake, regular weight bearing exercise. Discussed rpt DEXA after 02/2023.

## 2022-12-16 NOTE — Patient Instructions (Addendum)
Continue potassium and magnesium at this time. We will await labs on Monday to see how your level are.   Goal calcium is 1271m/day.  Try to get most or all of your calcium from your food-- To figure out dietary calcium: 300 mg/day from all non dairy foods plus 300 mg per cup of milk, other dairy, or fortified juice. Non dairy foods that contain calcium: Kale, oranges, sardines, oatmeal, soy milk/soybeans, salmon, white beans, dried figs, turnip greens, almonds, broccoli, tofu.  If you're not reaching 12087mgoal of calcium daily, add calcium supplement 60028maily.  We should repeat bone density scan ~02/2023   Return after 03/28/2023 for physical/wellness visit

## 2022-12-16 NOTE — Assessment & Plan Note (Addendum)
Paresthesias may have been due to this.  Reviewed dietary calcium goals, add supplement if not reaching these goals.

## 2022-12-16 NOTE — Assessment & Plan Note (Signed)
Present since ~2021, overall stable. Denies symptoms of chest pain, tightness, shortness of breath, dizziness or palpitations.

## 2022-12-16 NOTE — Progress Notes (Signed)
Patient ID: DELAPHINE DALMAU, female    DOB: 11-Jun-1951, 72 y.o.   MRN: [id number]  This visit was conducted in person.  BP 136/62   Pulse 80   Temp (!) 97.2 F (36.2 C) (Temporal)   Ht 4' 11"$  (1.499 m)   Wt 109 lb 4 oz (49.6 kg)   SpO2 95%   BMI 22.07 kg/m    CC: hospital f/u visit  Subjective:   HPI: MARITSA KRENZER is a 72 y.o. female presenting on 12/16/2022 for Hospitalization Follow-up (Admitted on 12/11/22 at [facility], dx weakness; hypokalemia. )   Recent hospitalization for paresthesias to face chest and arms in setting of diarrheal episodes from presumed Crohn's exacerbation. Did not have abd pain, n/v, fevers/chills, UTI symptoms, HA blurry vision or further focal neurological deficits. No numbness or weakness noted.  Hospital records reviewed. Med rec performed.  CT head returned normal.  EKG stable.  She was found to have marked low calcium, potassium and magnesium, s/p repletion with significant improvement in symptoms.   Overall feeling much better.  She continues taking magnesium 536m daily, potassium 913mOTC daily. She is not taking calcium at this time.  She continues pantoprazole 4033maily - no significant GERD symptoms.   Upcoming endoscopy/colonoscopy scheduled with Dr VanMarius Ditchr 12/29/2022. Planning to get labwork next week through GI to include BMP, Cortisol, Mg, Celiac disease panel. Started on xifaxan TID as well for 2 wks.   Home health not set up.  Other follow up appointments scheduled: none - to keep previously scheduled GI f/u (colonoscopy) ______________________________________________________________________ Hospital admission: 12/11/2022 Hospital discharge: 12/13/2022 TCM f/u phone call: not performed   Recommendations at discharge:   Follow up with PCP in 2 weeks   Discharge Diagnoses: Principal Problem:   Weakness generalized Active Problems:   Essential hypertension   [place]'s [place] Hypokalemia due to excessive gastrointestinal  loss of potassium   Hypomagnesemia   Hypocalcemia     Relevant past medical, surgical, family and social history reviewed and updated as indicated. Interim medical history since our last visit reviewed. Allergies and medications reviewed and updated. Outpatient Medications Prior to Visit  Medication Sig Dispense Refill   amLODipine (NORVASC) 5 MG tablet TAKE 1 TABLET BY MOUTH ONCE A DAY 90 tablet 3   budesonide (ENTOCORT EC) 3 MG 24 hr capsule Take 3 mg by mouth daily.     cetirizine (ZYRTEC) 10 MG tablet TAKE ONE TABLET DAILY AS DIRECTED 100 tablet 3   Cholecalciferol (VITAMIN D) 50 MCG (2000 UT) CAPS Take 1 capsule (2,000 Units total) by mouth daily. 90 capsule 3   cyanocobalamin (VITAMIN B12) 1000 MCG/ML injection Inject 1 mL (1,000 mcg total) into the skin every 30 (thirty) days. 1 mL 11   ferrous sulfate (FEROSUL) 325 (65 FE) MG tablet Take 1 tablet (325 mg total) by mouth every Monday, Wednesday, and Friday.     folic acid (FOLVITE) 800Q000111QG tablet Take 1 tablet (800 mcg total) by mouth daily.     magnesium gluconate (MAGONATE) 500 MG tablet Take 1 tablet (500 mg total) by mouth daily. 30 tablet 1   mercaptopurine (PURINETHOL) 50 MG tablet TAKE 1 TABLET BY MOUTH DAILY 1 HOUR BEFORE OR 2 HOURS AFTER MEALS ON AN EMPTY STOMACH. 90 tablet 3   pantoprazole (PROTONIX) 40 MG tablet Take 40 mg by mouth daily.     Potassium 99 MG TABS Take 1 tablet (99 mg total) by mouth daily. 30 tablet 0  rifaximin (XIFAXAN) 550 MG TABS tablet Take 1 tablet (550 mg total) by mouth 3 (three) times daily for 14 days. 60 tablet 0   SYRINGE/NEEDLE, DISP, 1 ML 23G X 1" 1 ML MISC Use as directed 8 each 0   No facility-administered medications prior to visit.     Per HPI unless specifically indicated in ROS section below Review of Systems  Objective:  BP 136/62   Pulse 80   Temp (!) 97.2 F (36.2 C) (Temporal)   Ht 4' 11"$  (1.499 m)   Wt 109 lb 4 oz (49.6 kg)   SpO2 95%   BMI 22.07 kg/m   Wt Readings  from Last 3 Encounters:  12/16/22 109 lb 4 oz (49.6 kg)  12/11/22 108 lb (49 kg)  10/23/22 111 lb 8 oz (50.6 kg)      Physical Exam Vitals and nursing note reviewed.  Constitutional:      Appearance: Normal appearance. She is not ill-appearing.  HENT:     Head: Normocephalic and atraumatic.     Mouth/Throat:     Mouth: Mucous membranes are moist.     Pharynx: Oropharynx is clear. No oropharyngeal exudate or posterior oropharyngeal erythema.  Eyes:     Extraocular Movements: Extraocular movements intact.     Conjunctiva/sclera: Conjunctivae normal.     Pupils: Pupils are equal, round, and reactive to light.  Cardiovascular:     Rate and Rhythm: Normal rate and regular rhythm.     Pulses: Normal pulses.     Heart sounds: Murmur (3/6 systolic USB) heard.  Pulmonary:     Effort: Pulmonary effort is normal. No respiratory distress.     Breath sounds: Normal breath sounds. No wheezing, rhonchi or rales.  Musculoskeletal:     Right lower leg: No edema.     Left lower leg: No edema.  Skin:    General: Skin is warm and dry.     Findings: No rash.  Neurological:     Mental Status: She is alert.  Psychiatric:        Mood and Affect: Mood normal.        Behavior: Behavior normal.       Results for orders placed or performed during the hospital encounter of 12/11/22  Protime-INR  Result Value Ref Range   Prothrombin Time 12.7 11.4 - 15.2 seconds   INR 1.0 0.8 - 1.2  APTT  Result Value Ref Range   aPTT 27 24 - 36 seconds  CBC  Result Value Ref Range   WBC 9.3 4.0 - 10.5 K/uL   RBC 3.60 (L) 3.87 - 5.11 MIL/uL   Hemoglobin 11.7 (L) 12.0 - 15.0 g/dL   HCT 35.1 (L) 36.0 - 46.0 %   MCV 97.5 80.0 - 100.0 fL   MCH 32.5 26.0 - 34.0 pg   MCHC 33.3 30.0 - 36.0 g/dL   RDW 16.0 (H) 11.5 - 15.5 %   Platelets 311 150 - 400 K/uL   nRBC 0.0 0.0 - 0.2 %  Differential  Result Value Ref Range   Neutrophils Relative % 70 %   Neutro Abs 6.6 1.7 - 7.7 K/uL   Lymphocytes Relative 22 %    Lymphs Abs 2.0 0.7 - 4.0 K/uL   Monocytes Relative 6 %   Monocytes Absolute 0.6 0.1 - 1.0 K/uL   Eosinophils Relative 1 %   Eosinophils Absolute 0.1 0.0 - 0.5 K/uL   Basophils Relative 1 %   Basophils Absolute 0.1 0.0 - 0.1 K/uL  Immature Granulocytes 0 %   Abs Immature Granulocytes 0.03 0.00 - 0.07 K/uL  Comprehensive metabolic panel  Result Value Ref Range   Sodium 139 135 - 145 mmol/L   Potassium 2.2 (LL) 3.5 - 5.1 mmol/L   Chloride 98 98 - 111 mmol/L   CO2 26 22 - 32 mmol/L   Glucose, Bld 130 (H) 70 - 99 mg/dL   BUN 14 8 - 23 mg/dL   Creatinine, Ser 0.86 0.44 - 1.00 mg/dL   Calcium 7.9 (L) 8.9 - 10.3 mg/dL   Total Protein 7.2 6.5 - 8.1 g/dL   Albumin 3.8 3.5 - 5.0 g/dL   AST 33 15 - 41 U/L   ALT 24 0 - 44 U/L   Alkaline Phosphatase 59 38 - 126 U/L   Total Bilirubin 1.0 0.3 - 1.2 mg/dL   GFR, Estimated >60 >60 mL/min   Anion gap 15 5 - 15  Ethanol  Result Value Ref Range   Alcohol, Ethyl (B) <10 <10 mg/dL  Magnesium  Result Value Ref Range   Magnesium 0.8 (LL) 1.7 - 2.4 mg/dL  CBC  Result Value Ref Range   WBC 6.8 4.0 - 10.5 K/uL   RBC 3.47 (L) 3.87 - 5.11 MIL/uL   Hemoglobin 11.3 (L) 12.0 - 15.0 g/dL   HCT 33.7 (L) 36.0 - 46.0 %   MCV 97.1 80.0 - 100.0 fL   MCH 32.6 26.0 - 34.0 pg   MCHC 33.5 30.0 - 36.0 g/dL   RDW 16.1 (H) 11.5 - 15.5 %   Platelets 271 150 - 400 K/uL   nRBC 0.0 0.0 - 0.2 %  Basic metabolic panel  Result Value Ref Range   Sodium 141 135 - 145 mmol/L   Potassium 3.6 3.5 - 5.1 mmol/L   Chloride 107 98 - 111 mmol/L   CO2 27 22 - 32 mmol/L   Glucose, Bld 87 70 - 99 mg/dL   BUN 8 8 - 23 mg/dL   Creatinine, Ser 0.67 0.44 - 1.00 mg/dL   Calcium 8.3 (L) 8.9 - 10.3 mg/dL   GFR, Estimated >60 >60 mL/min   Anion gap 7 5 - 15  Magnesium  Result Value Ref Range   Magnesium 1.9 1.7 - 2.4 mg/dL    Assessment & Plan:   Problem List Items Addressed This Visit     Crohn's disease ([place])    Has established with [facility] ([place]) pending  colonoscopy.  She continues entocort and mercaptopurine.  She was recently started on Xifaxin antibiotic course.      Osteopenia    Reviewed diagnosis with patient as well as need to continue vit D, calcium intake, regular weight bearing exercise. Discussed rpt DEXA after 02/2023.      Diarrhea - Primary    Recent ER visit for paresthesias and weakness after crohn's diarrheal flare leading to hypocalcemia, hypokalemia, hypomagnesemia.  Electrolytes repleted, received IVF with significant improvement. She has labwork planned on Monday through GI prior to upcoming colonoscopy/endoscopy.  For now will continue magnesium 567m daily, potassium OTC 931mdaily, reviewed calcium intake (dietary and supplementation if needed) as per below.       Systolic murmur    Present since ~2021, overall stable. Denies symptoms of chest pain, tightness, shortness of breath, dizziness or palpitations.       Hypokalemia due to excessive gastrointestinal loss of potassium    Recent potassium repleted, has f/u labs planned next week.       Hypomagnesemia  Now on magnesium 568m daily.       Hypocalcemia    Paresthesias may have been due to this.  Reviewed dietary calcium goals, add supplement if not reaching these goals.         No orders of the defined types were placed in this encounter.   No orders of the defined types were placed in this encounter.   Patient Instructions  Continue potassium and magnesium at this time. We will await labs on Monday to see how your level are.   Goal calcium is 12066mday.  Try to get most or all of your calcium from your food-- To figure out dietary calcium: 300 mg/day from all non dairy foods plus 300 mg per cup of milk, other dairy, or fortified juice. Non dairy foods that contain calcium: Kale, oranges, sardines, oatmeal, soy milk/soybeans, salmon, white beans, dried figs, turnip greens, almonds, broccoli, tofu.  If you're not reaching 120021moal of  calcium daily, add calcium supplement 600m72mily.  We should repeat bone density scan ~02/2023   Return after 03/28/2023 for physical/wellness visit  Follow up plan: Return in about 3 months (around 03/28/2023), or if symptoms worsen or fail to improve, for annual exam, prior fasting for blood work, medicare wellness visit.  JaviRia Bush

## 2022-12-21 DIAGNOSIS — R197 Diarrhea, unspecified: Secondary | ICD-10-CM | POA: Diagnosis not present

## 2022-12-21 DIAGNOSIS — Z8719 Personal history of other diseases of the digestive system: Secondary | ICD-10-CM | POA: Diagnosis not present

## 2022-12-23 ENCOUNTER — Telehealth: Payer: Self-pay

## 2022-12-23 LAB — BASIC METABOLIC PANEL
BUN/Creatinine Ratio: 10 — ABNORMAL LOW (ref 12–28)
BUN: 9 mg/dL (ref 8–27)
CO2: 23 mmol/L (ref 20–29)
Calcium: 9.5 mg/dL (ref 8.7–10.3)
Chloride: 102 mmol/L (ref 96–106)
Creatinine, Ser: 0.92 mg/dL (ref 0.57–1.00)
Glucose: 112 mg/dL — ABNORMAL HIGH (ref 70–99)
Potassium: 3.3 mmol/L — ABNORMAL LOW (ref 3.5–5.2)
Sodium: 145 mmol/L — ABNORMAL HIGH (ref 134–144)
eGFR: 66 mL/min/{1.73_m2} (ref >59)

## 2022-12-23 LAB — MAGNESIUM: Magnesium: 1.4 mg/dL — ABNORMAL LOW (ref 1.6–2.3)

## 2022-12-23 LAB — CELIAC DISEASE PANEL
Endomysial IgA: NEGATIVE
IgA/Immunoglobulin A, Serum: 363 mg/dL (ref 64–422)
Transglutaminase IgA: <2 U/mL (ref 0–3)

## 2022-12-23 LAB — CORTISOL: Cortisol: 0.5 ug/dL — ABNORMAL LOW (ref 6.2–19.4)

## 2022-12-23 NOTE — Telephone Encounter (Signed)
-----   Message from Lin Landsman, MD sent at 12/22/2022  3:37 PM EST ----- Dr. Danise Mina  She has several electrolyte abnormalities along with very low random serum cortisol levels.  She needs urgent referral to endocrinology.  I did not check a.m. cortisol levels.  We can order a.m. cortisol levels.  Looks like she is already taking potassium and magnesium supplements.  Can you also check her serum PTH levels  Caryl Pina  Let's cancel her upper endoscopy and colonoscopy until her endocrine workup is completed.  And, please make a follow-up appointment to see me in 1 to 2 months.  Okay to [place]  Thank you  Rohini [employer]

## 2022-12-23 NOTE — Telephone Encounter (Signed)
Patient verbalized understanding of results. She will be looking for a call from PCP. Made her a follow up appointment with you in 2 months. Called ENDO and talk to Wannetta Sender and she will cancel the procedures

## 2022-12-24 ENCOUNTER — Encounter: Payer: Self-pay | Admitting: Family Medicine

## 2022-12-24 DIAGNOSIS — E2749 Other adrenocortical insufficiency: Secondary | ICD-10-CM

## 2022-12-29 ENCOUNTER — Ambulatory Visit: Admit: 2022-12-29 | Payer: HMO | Admitting: Gastroenterology

## 2022-12-29 SURGERY — COLONOSCOPY WITH PROPOFOL
Anesthesia: General

## 2023-01-04 ENCOUNTER — Encounter: Payer: Self-pay | Admitting: "Endocrinology

## 2023-01-04 NOTE — Telephone Encounter (Signed)
Can we check on this urgent referral? Pt needs to be evaluated as soon as possible.

## 2023-01-04 NOTE — Telephone Encounter (Signed)
Pt is scheduled for 3/25

## 2023-01-15 ENCOUNTER — Encounter: Payer: Self-pay | Admitting: Primary Care

## 2023-01-15 ENCOUNTER — Ambulatory Visit (INDEPENDENT_AMBULATORY_CARE_PROVIDER_SITE_OTHER): Payer: HMO | Admitting: Primary Care

## 2023-01-15 ENCOUNTER — Encounter: Payer: Self-pay | Admitting: "Endocrinology

## 2023-01-15 VITALS — BP 132/78 | HR 80 | Temp 97.3°F | Ht 59.0 in | Wt 108.0 lb

## 2023-01-15 DIAGNOSIS — L03211 Cellulitis of face: Secondary | ICD-10-CM

## 2023-01-15 HISTORY — DX: Cellulitis of face: L03.211

## 2023-01-15 MED ORDER — DOXYCYCLINE HYCLATE 100 MG PO CAPS: 100.0000 mg | ORAL_CAPSULE | Freq: Two times a day (BID) | ORAL | 0 refills | Status: AC

## 2023-01-15 MED ORDER — METHYLPREDNISOLONE ACETATE 80 MG/ML IJ SUSP
80.0000 mg | Freq: Once | INTRAMUSCULAR | Status: AC
  Administered 2023-01-15: 80 mg via INTRAMUSCULAR

## 2023-01-15 NOTE — Patient Instructions (Addendum)
Start Doxycycline antibiotic for the infection. Take 1 tablet by mouth twice daily for 7 days.  Please notify us if no improvement by early next week.  It was a pleasure meeting you!

## 2023-01-15 NOTE — Assessment & Plan Note (Addendum)
Mild to moderate case on exam, no distress. No evidence of herpes zoster or insect bite wound to scalp.  No ear involvement.   Treat with IM Depo medrol 80 mg now. Start Doxycyline 100 mg BID x 7 days. Discussed use of antihistamines.   Close follow up with PCP if no improvement in 3-4 days.

## 2023-01-15 NOTE — Progress Notes (Signed)
Subjective:    Patient ID: Michelle Vasquez, female    DOB: 25-Aug-1951, 72 y.o.   MRN: [id number]  HPI  Michelle Vasquez is a very pleasant 72 y.o. female patient of Dr. Danise Mina with a history of hypertension, polyarthritis, osteopenia, crohn's disease, iron deficiency anemia who presents today to discuss facial swelling.  Yesterday she began to notice itching to the entire scalp after coming inside from walking her dog. She thinks something bit her but never felt a bite.   Last night she took Benadryl and slept well. This morning she woke up with left sided scalp and facial redness, pain, and swelling with warmth. She continues to notice itching to her scalp.   She has not taken any Benadryl and does not take antihistamines daily. She denies fevers, rashes, visual disturbance, scratchy throat, wheezing, chest tightness, ear pain.    Review of Systems  Constitutional:  Negative for fever.  HENT:  Positive for facial swelling. Negative for congestion, ear pain, sore throat and trouble swallowing.   Respiratory:  Negative for wheezing.   Skin:  Positive for color change. Negative for rash.         Past Medical History:  Diagnosis Date   Allergy    seasonal   Anxiety and depression 07/03/2011   B12 deficiency    Blood transfusion without reported diagnosis    Central hearing loss    COVID-19 virus infection 03/03/2021   Crohn disease ([place])    s/p colectomy on 6MP, ?bacterial overgrowth   Fatty liver 12/15/2011   History of small bowel obstruction    HTN (hypertension)    Internal hemorrhoids without mention of complication    Osteomalacia, unspecified    Perennial allergic rhinitis with seasonal variation    Squamous cell cancer of skin of left hand 11/2022   Dr Veleta Miners   Unspecified deficiency anemia    Unspecified polyarthropathy or polyarthritis, multiple sites     Social History   Socioeconomic History   Marital status: Divorced    Spouse name: Not on file    Number of children: 3   Years of education: Not on file   Highest education level: Not on file  Occupational History   Occupation: Medical Transcription    Employer: [employer]  Tobacco Use   Smoking status: Former    Packs/day: 0.25    Years: 15.00    Additional pack years: 0.00    Total pack years: 3.75    Types: Cigarettes    Quit date: 10/26/1978    Years since quitting: 44.2   Smokeless tobacco: Never  Vaping Use   Vaping Use: Never used  Substance and Sexual Activity   Alcohol use: No   Drug use: No   Sexual activity: Not on file  Other Topics Concern   Not on file  Social History Narrative   Caffeine: 2 cups coffee/day   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, no regular activity   Diet: good water, fruits/vegetables occasionally   Social Determinants of Health   Financial Resource Strain: Low Risk  (11/22/2020)   Overall Financial Resource Strain (CARDIA)    Difficulty of Paying Living Expenses: Not hard at all  Food Insecurity: Food Insecurity Present (12/11/2022)   Hunger Vital Sign    Worried About Running Out of Food in the Last Year: Sometimes true    Ran Out of Food in  the Last Year: Sometimes true  Transportation Needs: Unmet Transportation Needs (12/11/2022)   PRAPARE - Transportation    Lack of Transportation (Medical): Yes    Lack of Transportation (Non-Medical): Yes  Physical Activity: Inactive (11/22/2020)   Exercise Vital Sign    Days of Exercise per Week: 0 days    Minutes of Exercise per Session: 0 min  Stress: Stress Concern Present (11/22/2020)   [place]    Feeling of Stress : Very much  Social Connections: Not on file  Intimate Partner Violence: Not At Risk (12/11/2022)   Humiliation, Afraid, Rape, and Kick questionnaire    Fear of Current or Ex-Partner: No    Emotionally Abused: No    Physically  Abused: No    Sexually Abused: No    Past Surgical History:  Procedure Laterality Date   APPENDECTOMY     COLONOSCOPY  02/25/05   Internal hemms; crohns; narrow anast   COLONOSCOPY  02/26/10   Prior right hemicolectomy o/w benign (Dr. Olevia Perches)   COLONOSCOPY  02/2015   benign biopsies, crohn's with stenotic stricture at anastomosis Olevia Perches)   COLONOSCOPY  12/2019   small adenomas, crohn's disease small intestine, diverticulosis, non-patent end to side ileocolonic anastomosis with inflammation and ulceration biopsied, rpt 3 yrs (Brahmbhatt)   DEXA  07/06/2011   normal T score -1.0 femur, spine   ESOPHAGOGASTRODUODENOSCOPY  02/25/05   Dilated stricture   ESOPHAGOGASTRODUODENOSCOPY  10/2017   esophageal stenosis dilated, duodenal diverticuluc (Nandigam)   HEMICOLECTOMY  1980   all small intestine, none large   OTHER SURGICAL HISTORY  1960s, 1978, 1980   ileal resection x 3 and subtotal colon resection   TOTAL ABDOMINAL HYSTERECTOMY  1975   ovaries out as well   UPPER GASTROINTESTINAL ENDOSCOPY      Family History  Problem Relation Age of Onset   Crohn's disease Father        colostomy   Hypertension Mother    Heart disease Mother 34   Alcohol abuse Brother    Cirrhosis Brother    Crohn's disease Sister    Crohn's disease Paternal Aunt        x 3   Crohn's disease Other        neice x 2   Colon cancer Neg Hx    Cancer Neg Hx    Diabetes Neg Hx    Rectal cancer Neg Hx    Stomach cancer Neg Hx    Esophageal cancer Neg Hx    Breast cancer Neg Hx     Allergies  Allergen Reactions   Celexa [Citalopram Hydrobromide] Other (See Comments)    Racing thoughts, couldn't sleep    Current Outpatient Medications on File Prior to Visit  Medication Sig Dispense Refill   amLODipine (NORVASC) 5 MG tablet TAKE 1 TABLET BY MOUTH ONCE A DAY 90 tablet 3   budesonide (ENTOCORT EC) 3 MG 24 hr capsule Take 3 mg by mouth daily.     Cholecalciferol (VITAMIN D) 50 MCG (2000 UT) CAPS Take 1  capsule (2,000 Units total) by mouth daily. 90 capsule 3   cyanocobalamin (VITAMIN B12) 1000 MCG/ML injection Inject 1 mL (1,000 mcg total) into the skin every 30 (thirty) days. 1 mL 11   ferrous sulfate (FEROSUL) 325 (65 FE) MG tablet Take 1 tablet (325 mg total) by mouth every Monday, Wednesday, and Friday.     folic acid (FOLVITE) Q000111Q MCG tablet Take 1 tablet (800 mcg total)  by mouth daily.     magnesium gluconate (MAGONATE) 500 MG tablet Take 1 tablet (500 mg total) by mouth daily. 30 tablet 1   mercaptopurine (PURINETHOL) 50 MG tablet TAKE 1 TABLET BY MOUTH DAILY 1 HOUR BEFORE OR 2 HOURS AFTER MEALS ON AN EMPTY STOMACH. 90 tablet 3   pantoprazole (PROTONIX) 40 MG tablet Take 40 mg by mouth daily.     Potassium 99 MG TABS Take 1 tablet (99 mg total) by mouth daily. 30 tablet 0   SYRINGE/NEEDLE, DISP, 1 ML 23G X 1" 1 ML MISC Use as directed 8 each 0   cetirizine (ZYRTEC) 10 MG tablet TAKE ONE TABLET DAILY AS DIRECTED 100 tablet 3   No current facility-administered medications on file prior to visit.    BP 132/78   Pulse 80   Temp (!) 97.3 F (36.3 C) (Temporal)   Ht 4\' 11"  (1.499 m)   Wt 108 lb (49 kg)   SpO2 98%   BMI 21.81 kg/m  Objective:   Physical Exam Constitutional:      General: She is not in acute distress. HENT:     Left Ear: Tympanic membrane and ear canal normal.  Pulmonary:     Effort: Pulmonary effort is normal.  Skin:    General: Skin is warm and dry.     Findings: Erythema present. No rash.     Comments: Mild erythema and swelling to left side of face to forehead, cheeks, behind left ear. Warmth noted.            Assessment & Plan:  Facial cellulitis Assessment & Plan: Mild to moderate case on exam, no distress. No evidence of herpes zoster or insect bite wound to scalp.  No ear involvement.   Treat with IM Depo medrol 80 mg now. Start Doxycyline 100 mg BID x 7 days. Discussed use of antihistamines.   Close follow up with PCP if no improvement  in 3-4 days.    Orders: -     Doxycycline Hyclate; Take 1 capsule (100 mg total) by mouth 2 (two) times daily for 7 days.  Dispense: 14 capsule; Refill: 0 -     methylPREDNISolone Acetate        Pleas Koch, NP

## 2023-01-18 ENCOUNTER — Encounter: Payer: Self-pay | Admitting: "Endocrinology

## 2023-01-18 ENCOUNTER — Ambulatory Visit (INDEPENDENT_AMBULATORY_CARE_PROVIDER_SITE_OTHER): Payer: HMO | Admitting: "Endocrinology

## 2023-01-18 VITALS — BP 162/78 | HR 76 | Ht 59.0 in | Wt 106.2 lb

## 2023-01-18 DIAGNOSIS — E2740 Unspecified adrenocortical insufficiency: Secondary | ICD-10-CM | POA: Insufficient documentation

## 2023-01-18 NOTE — Progress Notes (Signed)
Endocrinology Consult Note                                            01/18/2023, 4:16 PM   Subjective:    Patient ID: Michelle Vasquez, female    DOB: 07/21/1951, PCP [name], MD   Past Medical History:  Diagnosis Date   Allergy    seasonal   Anxiety and depression 07/03/2011   B12 deficiency    Blood transfusion without reported diagnosis    Central hearing loss    COVID-19 virus infection 03/03/2021   Crohn disease ([place])    s/p colectomy on 6MP, ?bacterial overgrowth   Fatty liver 12/15/2011   History of small bowel obstruction    HTN (hypertension)    Internal hemorrhoids without mention of complication    Osteomalacia, unspecified    Perennial allergic rhinitis with seasonal variation    Squamous cell cancer of skin of left hand 11/2022   Dr Veleta Miners   Unspecified deficiency anemia    Unspecified polyarthropathy or polyarthritis, multiple sites    Past Surgical History:  Procedure Laterality Date   APPENDECTOMY     COLONOSCOPY  02/25/05   Internal hemms; crohns; narrow anast   COLONOSCOPY  02/26/10   Prior right hemicolectomy o/w benign (Dr. Olevia Perches)   COLONOSCOPY  02/2015   benign biopsies, crohn's with stenotic stricture at anastomosis Olevia Perches)   COLONOSCOPY  12/2019   small adenomas, crohn's disease small intestine, diverticulosis, non-patent end to side ileocolonic anastomosis with inflammation and ulceration biopsied, rpt 3 yrs (Brahmbhatt)   DEXA  07/06/2011   normal T score -1.0 femur, spine   ESOPHAGOGASTRODUODENOSCOPY  02/25/05   Dilated stricture   ESOPHAGOGASTRODUODENOSCOPY  10/2017   esophageal stenosis dilated, duodenal diverticuluc (Nandigam)   HEMICOLECTOMY  1980   all small intestine, none large   OTHER SURGICAL HISTORY  1960s, 1978, 1980   ileal resection x 3 and subtotal colon resection   TOTAL ABDOMINAL HYSTERECTOMY  1975   ovaries out as well   UPPER GASTROINTESTINAL ENDOSCOPY     Social History   Socioeconomic History    Marital status: Divorced    Spouse name: Not on file   Number of children: 3   Years of education: Not on file   Highest education level: Not on file  Occupational History   Occupation: Medical Transcription    Employer: [employer]  Tobacco Use   Smoking status: Former    Packs/day: 0.25    Years: 15.00    Additional pack years: 0.00    Total pack years: 3.75    Types: Cigarettes    Quit date: 10/26/1978    Years since quitting: 44.2   Smokeless tobacco: Never  Vaping Use   Vaping Use: Never used  Substance and Sexual Activity   Alcohol use: No   Drug use: No   Sexual activity: Not on file  Other Topics Concern   Not on file  Social History Narrative   Caffeine: 2 cups coffee/day   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, no regular activity   Diet: good water, fruits/vegetables occasionally   Social Determinants of Health   Financial Resource Strain: Low Risk  (11/22/2020)   Overall Financial Resource Strain (CARDIA)  Difficulty of Paying Living Expenses: Not hard at all  Food Insecurity: Food Insecurity Present (12/11/2022)   Hunger Vital Sign    Worried About Running Out of Food in the Last Year: Sometimes true    Ran Out of Food in the Last Year: Sometimes true  Transportation Needs: Unmet Transportation Needs (12/11/2022)   PRAPARE - Hydrologist (Medical): Yes    Lack of Transportation (Non-Medical): Yes  Physical Activity: Inactive (11/22/2020)   Exercise Vital Sign    Days of Exercise per Week: 0 days    Minutes of Exercise per Session: 0 min  Stress: Stress Concern Present (11/22/2020)   [place]    Feeling of Stress : Very much  Social Connections: Not on file   Family History  Problem Relation Age of Onset   Crohn's disease Father        colostomy   Hypertension Mother    Heart disease  Mother 98   Alcohol abuse Brother    Cirrhosis Brother    Crohn's disease Sister    Crohn's disease Paternal Aunt        x 3   Crohn's disease Other        neice x 2   Colon cancer Neg Hx    Cancer Neg Hx    Diabetes Neg Hx    Rectal cancer Neg Hx    Stomach cancer Neg Hx    Esophageal cancer Neg Hx    Breast cancer Neg Hx    Outpatient Encounter Medications as of 01/18/2023  Medication Sig   amLODipine (NORVASC) 5 MG tablet TAKE 1 TABLET BY MOUTH ONCE A DAY   budesonide (ENTOCORT EC) 3 MG 24 hr capsule Take 3 mg by mouth daily.   Cholecalciferol (VITAMIN D) 50 MCG (2000 UT) CAPS Take 1 capsule (2,000 Units total) by mouth daily.   cyanocobalamin (VITAMIN B12) 1000 MCG/ML injection Inject 1 mL (1,000 mcg total) into the skin every 30 (thirty) days.   doxycycline (VIBRAMYCIN) 100 MG capsule Take 1 capsule (100 mg total) by mouth 2 (two) times daily for 7 days.   ferrous sulfate (FEROSUL) 325 (65 FE) MG tablet Take 1 tablet (325 mg total) by mouth every Monday, Wednesday, and Friday.   folic acid (FOLVITE) Q000111Q MCG tablet Take 1 tablet (800 mcg total) by mouth daily.   magnesium gluconate (MAGONATE) 500 MG tablet Take 1 tablet (500 mg total) by mouth daily.   mercaptopurine (PURINETHOL) 50 MG tablet TAKE 1 TABLET BY MOUTH DAILY 1 HOUR BEFORE OR 2 HOURS AFTER MEALS ON AN EMPTY STOMACH.   pantoprazole (PROTONIX) 40 MG tablet Take 40 mg by mouth daily.   Potassium 99 MG TABS Take 1 tablet (99 mg total) by mouth daily.   SYRINGE/NEEDLE, DISP, 1 ML 23G X 1" 1 ML MISC Use as directed   [DISCONTINUED] cetirizine (ZYRTEC) 10 MG tablet TAKE ONE TABLET DAILY AS DIRECTED   No facility-administered encounter medications on file as of 01/18/2023.   ALLERGIES: Allergies  Allergen Reactions   Celexa [Citalopram Hydrobromide] Other (See Comments)    Racing thoughts, couldn't sleep    VACCINATION STATUS: Immunization History  Administered Date(s) Administered   Fluad Quad(high Dose 65+)  09/07/2019, 11/25/2020, 12/12/2022   Hepatitis B 04/23/2013, 05/01/2013   Hepatitis B, PED/ADOLESCENT 09/25/2013   Influenza Whole 07/26/2008   Influenza,inj,Quad PF,6+ Mos 08/13/2014, 09/01/2018   Influenza-Unspecified 08/04/2016, 07/30/2017   PFIZER(Purple Top)SARS-COV-2 Vaccination 06/07/2020,  06/28/2020   Pneumococcal Conjugate-13 11/09/2017   Pneumococcal Polysaccharide-23 03/21/2013, 11/24/2019   Td 08/07/2004   Tdap 06/11/2014   Zoster, Live 08/24/2016    HPI Michelle Vasquez is 72 y.o. female who presents today with a medical history as above. she is being seen in consultation for hypocortisolemia requested by [name], MD.  History is obtained directly from the patient as well as chart review.  She reports that she was diagnosed with Crohn's disease at age 22.  She has received various modalities of treatment over the decades for it including high-dose steroids.  At times she has taken prednisone as high as 80 mg daily. More recently she was dealing with symptoms including dizziness, disequilibrium, weight loss.  Her recent trip to emergency room revealed hypocortisolemia at 0.5. She does not recall if she was ever diagnosed with adrenal insufficiency in the past. She is on medications including amlodipine, budesonide, vitamin D, vitamin 123456, folic acid, magnesium gluconate, mercaptopurine, Protonix, potassium supplements.   Review of Systems  Constitutional: + mildly fluctuating body weight gain, + fatigue, no subjective hyperthermia, no subjective hypothermia Eyes: no blurry vision, no xerophthalmia ENT: no sore throat, no nodules palpated in throat, no dysphagia/odynophagia, no hoarseness Cardiovascular: no Chest Pain, no Shortness of Breath, no palpitations, no leg swelling Respiratory: no cough, no shortness of breath Gastrointestinal: no Nausea/Vomiting/Diarhhea Musculoskeletal: no muscle/joint aches, + dysequilibrium Skin: no active  rashes Neurological: no  tremors, no numbness, no tingling, no dizziness Psychiatric: no depression, no anxiety  Objective:       01/18/2023    1:12 PM 01/15/2023    1:58 PM 12/16/2022   11:31 AM  Vitals with BMI  Height 4\' 11"  4\' 11"  4\' 11"   Weight 106 lbs 3 oz 108 lbs 109 lbs 4 oz  BMI 21.44 [id number] 0000000  Systolic 0000000 Q000111Q [id number]  Diastolic 78 78 62  Pulse 76 80 80    BP (!) 162/78 Comment: 158/82 R arm with manuel cuff  Pulse 76   Ht 4\' 11"  (1.499 m)   Wt 106 lb 3.2 oz (48.2 kg)   BMI 21.45 kg/m   Wt Readings from Last 3 Encounters:  01/18/23 106 lb 3.2 oz (48.2 kg)  01/15/23 108 lb (49 kg)  12/16/22 109 lb 4 oz (49.6 kg)    Physical Exam  Constitutional:  Body mass index is 21.45 kg/m.,  not in acute distress, normal state of mind Eyes: PERRLA, EOMI, no exophthalmos ENT: moist mucous membranes, no gross thyromegaly, no gross cervical lymphadenopathy Cardiovascular: normal precordial activity, Regular Rate and Rhythm, no Murmur/Rubs/Gallops Respiratory:  adequate breathing efforts, no gross chest deformity, Clear to auscultation bilaterally Gastrointestinal: abdomen soft, Non -tender, No distension, Bowel Sounds present, no gross organomegaly Musculoskeletal: no gross deformities, strength intact in all four extremities Skin: moist, warm, no rashes Neurological: no tremor with outstretched hands, Deep tendon reflexes normal in bilateral lower extremities.  CMP ( most recent) CMP     Component Value Date/Time   NA 145 (H) 12/21/2022 1313   K 3.3 (L) 12/21/2022 1313   CL 102 12/21/2022 1313   CO2 23 12/21/2022 1313   GLUCOSE 112 (H) 12/21/2022 1313   GLUCOSE 87 12/12/2022 0639   BUN 9 12/21/2022 1313   CREATININE 0.92 12/21/2022 1313   CREATININE 0.84 10/02/2022 1537   CALCIUM 9.5 12/21/2022 1313   PROT 7.2 12/11/2022 1223   ALBUMIN 3.8 12/11/2022 1223   AST 33 12/11/2022 1223   ALT 24 12/11/2022 1223  ALKPHOS 59 12/11/2022 1223   BILITOT 1.0 12/11/2022 1223   GFRNONAA >60 12/12/2022  0639   GFRAA >60 11/21/2019 1238     Diabetic Labs (most recent): Lab Results  Component Value Date   MICROALBUR 5.0 (H) 03/20/2022   MICROALBUR 0.8 11/21/2020   MICROALBUR 1.5 11/20/2019     Lipid Panel ( most recent) Lipid Panel     Component Value Date/Time   CHOL 192 03/20/2022 1319   TRIG 68.0 03/20/2022 1319   HDL 96.90 03/20/2022 1319   CHOLHDL 2 03/20/2022 1319   VLDL 13.6 03/20/2022 1319   LDLCALC 82 03/20/2022 1319      Lab Results  Component Value Date   TSH 2.13 03/20/2022   TSH 3.59 01/31/2018   TSH 2.81 06/04/2014   TSH 3.29 04/09/2010   TSH 2.78 04/03/2009   TSH 2.32 08/03/2007      Assessment & Plan:   1. Adrenal insufficiency (Michelle Vasquez) 2. Hypokalemia   - Michelle Vasquez  is being seen at a kind request of [name], MD. - I have reviewed her available records and clinically evaluated the patient. - Based on these reviews, she has hypocortisolemia and hypokalemia which could be directly related to primary adrenal insufficiency which in turn is cause by chronic steroids exposure. She may need replacement dose glucocorticoids and mineralocorticoids.  However this patient will need a confirmatory test before subjecting her to this long-term prescriptions.  I discussed and ordered ACTH stimulation test for her to be completed in the next several days.  She will return in 7-10 days for reevaluation and management initiation if necessary.  1 discordant finding and the fact that she is still running high blood pressure.  She is advised to continue her amlodipine 5 mg p.o. daily.  She is also advised to continue her potassium supplement.  She will need a repeat DXA in light of her last DXA showing Osteopenia in 2019.   - I did not initiate any new prescriptions today. - she is advised to maintain close follow up with [name], MD for primary care needs.   - Time spent with the patient: 45 minutes, of which >50% was spent in  counseling her  about her  hypocortisolemia and the rest in obtaining information about her symptoms, reviewing her previous labs/studies ( including abstractions from other facilities),  evaluations, and treatments,  and developing a plan to confirm diagnosis and long term treatment based on the latest standards of care/guidelines; and documenting her care.  Michelle Vasquez participated in the discussions, expressed understanding, and voiced agreement with the above plans.  All questions were answered to her satisfaction. she is encouraged to contact clinic should she have any questions or concerns prior to her return visit.  Follow up plan: Return in about 1 week (around 01/25/2023) for F/U with Pre-visit Labs.   Michelle Lloyd, MD [facility] [address] [place], [place] [address] Phone: [phone]  Fax: [phone]     01/18/2023, 4:16 PM  This note was partially dictated with voice recognition software. Similar sounding words can be transcribed inadequately or may not  be corrected upon review.

## 2023-01-20 ENCOUNTER — Other Ambulatory Visit: Payer: Self-pay

## 2023-01-26 ENCOUNTER — Encounter (HOSPITAL_COMMUNITY)
Admission: RE | Admit: 2023-01-26 | Discharge: 2023-01-26 | Disposition: A | Discharge status: Home / Self Care | Payer: HMO | Source: Ambulatory Visit | Attending: "Endocrinology | Admitting: "Endocrinology

## 2023-01-26 VITALS — BP 157/60 | HR 66 | Temp 98.3°F | Resp 16

## 2023-01-26 DIAGNOSIS — E2740 Unspecified adrenocortical insufficiency: Secondary | ICD-10-CM | POA: Diagnosis not present

## 2023-01-26 LAB — ACTH STIMULATION, 3 TIME POINTS
Cortisol, 30 Min: 3.8 ug/dL
Cortisol, 60 Min: 5 ug/dL
Cortisol, Base: <0.4 ug/dL

## 2023-01-26 MED ORDER — COSYNTROPIN 0.25 MG IJ SOLR
0.2500 mg | Freq: Once | INTRAMUSCULAR | Status: AC
  Administered 2023-01-26: 0.25 mg via INTRAMUSCULAR
  Filled 2023-01-26: qty 0.25

## 2023-01-26 NOTE — Progress Notes (Signed)
Diagnosis: Adrenal insufficiency (ACTH testing)  Provider:   Dr. Loni Beckwith MD  Procedure: Injection  Cosyntropin, Dose: 0.25 mg , Site: intramuscular, Number of injections: 1  Administered in left deltoid  Post Care: Observation period completed and Peripheral IV Discontinued  Discharge: Condition: Good, Destination: Home . AVS Provided  Performed by:  Binnie Kand, RN

## 2023-01-27 ENCOUNTER — Encounter (HOSPITAL_COMMUNITY): Admission: RE | Admit: 2023-01-27 | Payer: HMO | Source: Ambulatory Visit

## 2023-02-03 DIAGNOSIS — D492 Neoplasm of unspecified behavior of bone, soft tissue, and skin: Secondary | ICD-10-CM | POA: Diagnosis not present

## 2023-02-03 DIAGNOSIS — B079 Viral wart, unspecified: Secondary | ICD-10-CM | POA: Diagnosis not present

## 2023-02-03 DIAGNOSIS — L570 Actinic keratosis: Secondary | ICD-10-CM | POA: Diagnosis not present

## 2023-02-09 ENCOUNTER — Ambulatory Visit (INDEPENDENT_AMBULATORY_CARE_PROVIDER_SITE_OTHER): Payer: HMO | Admitting: "Endocrinology

## 2023-02-09 ENCOUNTER — Encounter: Payer: Self-pay | Admitting: "Endocrinology

## 2023-02-09 VITALS — BP 132/76 | HR 64 | Ht 59.0 in | Wt 106.0 lb

## 2023-02-09 DIAGNOSIS — E2740 Unspecified adrenocortical insufficiency: Secondary | ICD-10-CM

## 2023-02-09 MED ORDER — PREDNISONE 5 MG PO TABS: 5.0000 mg | ORAL_TABLET | Freq: Every day | ORAL | 1 refills | Status: DC

## 2023-02-09 NOTE — Progress Notes (Unsigned)
Endocrinology Consult Note                                            02/09/2023, 6:11 PM   Subjective:    Patient ID: Michelle Vasquez, female    DOB: [birth year], PCP Eustaquio Boyden, MD   Past Medical History:  Diagnosis Date   Allergy    seasonal   Anxiety and depression 07/03/2011   B12 deficiency    Blood transfusion without reported diagnosis    Central hearing loss    COVID-19 virus infection 03/03/2021   Crohn disease    s/p colectomy on , ?bacterial overgrowth   Fatty liver 12/15/2011   History of small bowel obstruction    HTN (hypertension)    Internal hemorrhoids without mention of complication    Osteomalacia, unspecified    Perennial allergic rhinitis with seasonal variation    Squamous cell cancer of skin of left hand 11/2022   Dr Juanita Craver   Unspecified deficiency anemia    Unspecified polyarthropathy or polyarthritis, multiple sites    Past Surgical History:  Procedure Laterality Date   APPENDECTOMY     COLONOSCOPY  02/25/05   Internal hemms; crohns; narrow anast   COLONOSCOPY  02/26/10   Prior right hemicolectomy o/w benign (Dr. Juanda Chance)   COLONOSCOPY  02/2015   benign biopsies, crohn's with stenotic stricture at anastomosis Juanda Chance)   COLONOSCOPY  12/2019   small adenomas, crohn's disease small intestine, diverticulosis, non-patent end to side ileocolonic anastomosis with inflammation and ulceration biopsied, rpt 3 yrs (Brahmbhatt)   DEXA  07/06/2011   normal T score -1.0 femur, spine   ESOPHAGOGASTRODUODENOSCOPY  02/25/05   Dilated stricture   ESOPHAGOGASTRODUODENOSCOPY  10/2017   esophageal stenosis dilated, duodenal diverticuluc (Nandigam)   HEMICOLECTOMY  1980   all small intestine, none large   OTHER SURGICAL HISTORY  1960s, 1978, 1980   ileal resection x 3 and subtotal colon resection   TOTAL ABDOMINAL HYSTERECTOMY  1975   ovaries out as well   UPPER GASTROINTESTINAL ENDOSCOPY     Social History   Socioeconomic History   Marital  status: Divorced    Spouse name: Not on file   Number of children: 3   Years of education: Not on file   Highest education level: Not on file  Occupational History   Occupation: Medical Transcription    Employer: OTHER    Comment: [employer]  Tobacco Use   Smoking status: Former    Packs/day: 0.25    Years: 15.00    Additional pack years: 0.00    Total pack years: 3.75    Types: Cigarettes    Quit date: 10/26/1978    Years since quitting: 44.3   Smokeless tobacco: Never  Vaping Use   Vaping Use: Never used  Substance and Sexual Activity   Alcohol use: No   Drug use: No   Sexual activity: Not on file  Other Topics Concern   Not on file  Social History Narrative   Caffeine: 2 cups coffee/day   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, no regular activity   Diet: good water, fruits/vegetables occasionally   Social Determinants of Health   Financial Resource Strain: Low Risk  (11/22/2020)   Overall Financial Resource Strain (CARDIA)    Difficulty  of Paying Living Expenses: Not hard at all  Food Insecurity: Food Insecurity Present (12/11/2022)   Hunger Vital Sign    Worried About Running Out of Food in the Last Year: Sometimes true    Ran Out of Food in the Last Year: Sometimes true  Transportation Needs: Unmet Transportation Needs (12/11/2022)   PRAPARE - Administrator, Civil Service (Medical): Yes    Lack of Transportation (Non-Medical): Yes  Physical Activity: Inactive (11/22/2020)   Exercise Vital Sign    Days of Exercise per Week: 0 days    Minutes of Exercise per Session: 0 min  Stress: Stress Concern Present (11/22/2020)   [employer] of Occupational Health - Occupational Stress Questionnaire    Feeling of Stress : Very much  Social Connections: Not on file   Family History  Problem Relation Age of Onset   Crohn's disease Father        colostomy   Hypertension Mother    Heart disease Mother  20   Alcohol abuse Brother    Cirrhosis Brother    Crohn's disease Sister    Crohn's disease Paternal Aunt        x 3   Crohn's disease Other        neice x 2   Colon cancer Neg Hx    Cancer Neg Hx    Diabetes Neg Hx    Rectal cancer Neg Hx    Stomach cancer Neg Hx    Esophageal cancer Neg Hx    Breast cancer Neg Hx    Outpatient Encounter Medications as of 02/09/2023  Medication Sig   predniSONE (DELTASONE) 5 MG tablet Take 1 tablet (5 mg total) by mouth daily with breakfast.   budesonide (ENTOCORT EC) 3 MG 24 hr capsule Take 3 mg by mouth daily.   Cholecalciferol (VITAMIN D) 50 MCG (2000 UT) CAPS Take 1 capsule (2,000 Units total) by mouth daily.   cyanocobalamin (VITAMIN B12) 1000 MCG/ML injection Inject 1 mL (1,000 mcg total) into the skin every 30 (thirty) days.   ferrous sulfate (FEROSUL) 325 (65 FE) MG tablet Take 1 tablet (325 mg total) by mouth every Monday, Wednesday, and Friday.   folic acid (FOLVITE) 800 MCG tablet Take 1 tablet (800 mcg total) by mouth daily.   magnesium gluconate (MAGONATE) 500 MG tablet Take 1 tablet (500 mg total) by mouth daily.   mercaptopurine (PURINETHOL) 50 MG tablet TAKE 1 TABLET BY MOUTH DAILY 1 HOUR BEFORE OR 2 HOURS AFTER MEALS ON AN EMPTY STOMACH.   pantoprazole (PROTONIX) 40 MG tablet Take 40 mg by mouth daily.   Potassium 99 MG TABS Take 1 tablet (99 mg total) by mouth daily.   SYRINGE/NEEDLE, DISP, 1 ML 23G X 1" 1 ML MISC Use as directed   [DISCONTINUED] amLODipine (NORVASC) 5 MG tablet TAKE 1 TABLET BY MOUTH ONCE A DAY   No facility-administered encounter medications on file as of 02/09/2023.   ALLERGIES: Allergies  Allergen Reactions   Celexa [Citalopram Hydrobromide] Other (See Comments)    Racing thoughts, couldn't sleep    VACCINATION STATUS: Immunization History  Administered Date(s) Administered   Fluad Quad(high Dose 65+) 09/07/2019, 11/25/2020, 12/12/2022   Hepatitis B 04/23/2013, 05/01/2013   Hepatitis B,  PED/ADOLESCENT 09/25/2013   Influenza Whole 07/26/2008   Influenza,inj,Quad PF,6+ Mos 08/13/2014, 09/01/2018   Influenza-Unspecified 08/04/2016, 07/30/2017   PFIZER(Purple Top)SARS-COV-2 Vaccination 06/07/2020, 06/28/2020   Pneumococcal Conjugate-13 11/09/2017   Pneumococcal Polysaccharide-23 03/21/2013, 11/24/2019   Td 08/07/2004  Tdap 06/11/2014   Zoster, Live 08/24/2016    HPI Michelle Vasquez is 72 y.o. female who presents today with a medical history as above. she is being seen in consultation for hypocortisolemia requested by Eustaquio Boyden, MD.  History is obtained directly from the patient as well as chart review.  She reports that she was diagnosed with Crohn's disease at age 72.  She has received various modalities of treatment over the decades for it including high-dose steroids.  At times she has taken prednisone as high as 80 mg daily. More recently she was dealing with symptoms including dizziness, disequilibrium, weight loss.  Her recent trip to emergency room revealed hypocortisolemia at 0.5. She does not recall if she was ever diagnosed with adrenal insufficiency in the past. She is on medications including amlodipine, budesonide, vitamin D, vitamin B12, folic acid, magnesium gluconate, mercaptopurine, Protonix, potassium supplements.   Review of Systems  Constitutional: + mildly fluctuating body weight gain, + fatigue, no subjective hyperthermia, no subjective hypothermia Eyes: no blurry vision, no xerophthalmia ENT: no sore throat, no nodules palpated in throat, no dysphagia/odynophagia, no hoarseness Cardiovascular: no Chest Pain, no Shortness of Breath, no palpitations, no leg swelling Respiratory: no cough, no shortness of breath Gastrointestinal: no Nausea/Vomiting/Diarhhea Musculoskeletal: no muscle/joint aches, + dysequilibrium Skin: no active  rashes Neurological: no tremors, no numbness, no tingling, no dizziness Psychiatric: no depression, no  anxiety  Objective:       02/09/2023    9:35 AM 01/26/2023    9:38 AM 01/18/2023    1:12 PM  Vitals with BMI  Height 4\' 11"   4\' 11"   Weight 106 lbs  106 lbs 3 oz  BMI 21.4  21.44  Systolic 132 157 094  Diastolic 76 60 78  Pulse 64 66 76    BP 132/76   Pulse 64   Ht 4\' 11"  (1.499 m)   Wt 106 lb (48.1 kg)   BMI 21.41 kg/m   Wt Readings from Last 3 Encounters:  02/09/23 106 lb (48.1 kg)  01/18/23 106 lb 3.2 oz (48.2 kg)  01/15/23 108 lb (49 kg)    Physical Exam  Constitutional:  Body mass index is 21.41 kg/m.,  not in acute distress, normal state of mind Eyes: PERRLA, EOMI, no exophthalmos ENT: moist mucous membranes, no gross thyromegaly, no gross cervical lymphadenopathy Cardiovascular: normal precordial activity, Regular Rate and Rhythm, no Murmur/Rubs/Gallops Respiratory:  adequate breathing efforts, no gross chest deformity, Clear to auscultation bilaterally Gastrointestinal: abdomen soft, Non -tender, No distension, Bowel Sounds present, no gross organomegaly Musculoskeletal: no gross deformities, strength intact in all four extremities Skin: moist, warm, no rashes Neurological: no tremor with outstretched hands, Deep tendon reflexes normal in bilateral lower extremities.  CMP ( most recent) CMP     Component Value Date/Time   NA 145 (H) 12/21/2022 1313   K 3.3 (L) 12/21/2022 1313   CL 102 12/21/2022 1313   CO2 23 12/21/2022 1313   GLUCOSE 112 (H) 12/21/2022 1313   GLUCOSE 87 12/12/2022 0639   BUN 9 12/21/2022 1313   CREATININE 0.92 12/21/2022 1313   CREATININE 0.84 10/02/2022 1537   CALCIUM 9.5 12/21/2022 1313   PROT 7.2 12/11/2022 1223   ALBUMIN 3.8 12/11/2022 1223   AST 33 12/11/2022 1223   ALT 24 12/11/2022 1223   ALKPHOS 59 12/11/2022 1223   BILITOT 1.0 12/11/2022 1223   GFRNONAA >60 12/12/2022 0639   GFRAA >60 11/21/2019 1238     Diabetic Labs (most recent): Lab  Results  Component Value Date   MICROALBUR 5.0 (H) 03/20/2022   MICROALBUR 0.8  11/21/2020   MICROALBUR 1.5 11/20/2019     Lipid Panel ( most recent) Lipid Panel     Component Value Date/Time   CHOL 192 03/20/2022 1319   TRIG 68.0 03/20/2022 1319   HDL 96.90 03/20/2022 1319   CHOLHDL 2 03/20/2022 1319   VLDL 13.6 03/20/2022 1319   LDLCALC 82 03/20/2022 1319      Lab Results  Component Value Date   TSH 2.13 03/20/2022   TSH 3.59 01/31/2018   TSH 2.81 06/04/2014   TSH 3.29 04/09/2010   TSH 2.78 04/03/2009   TSH 2.32 08/03/2007      Assessment & Plan:   1. Adrenal insufficiency (HCC) 2. Hypokalemia   - AMERICUS SCHEURICH  is being seen at a kind request of Eustaquio Boyden, MD. - I have reviewed her available records and clinically evaluated the patient. - Based on these reviews, she has hypocortisolemia and hypokalemia which could be directly related to primary adrenal insufficiency which in turn is cause by chronic steroids exposure. She may need replacement dose glucocorticoids and mineralocorticoids.  However this patient will need a confirmatory test before subjecting her to this long-term prescriptions.  I discussed and ordered ACTH stimulation test for her to be completed in the next several days.  She will return in 7-10 days for reevaluation and management initiation if necessary.  1 discordant finding and the fact that she is still running high blood pressure.  She is advised to continue her amlodipine 5 mg p.o. daily.  She is also advised to continue her potassium supplement.  She will need a repeat DXA in light of her last DXA showing Osteopenia in 2019.   - I did not initiate any new prescriptions today. - she is advised to maintain close follow up with Eustaquio Boyden, MD for primary care needs.   - Time spent with the patient: 45 minutes, of which >50% was spent in  counseling her about her  hypocortisolemia and the rest in obtaining information about her symptoms, reviewing her previous labs/studies ( including abstractions from other  facilities),  evaluations, and treatments,  and developing a plan to confirm diagnosis and long term treatment based on the latest standards of care/guidelines; and documenting her care.  Michelle Vasquez participated in the discussions, expressed understanding, and voiced agreement with the above plans.  All questions were answered to her satisfaction. she is encouraged to contact clinic should she have any questions or concerns prior to her return visit.  Follow up plan: No follow-ups on file.   Marquis Lunch, MD [facility] [address] [place], [place] [address] Phone: [phone]  Fax: [phone]     02/09/2023, 6:11 PM  This note was partially dictated with voice recognition software. Similar sounding words can be transcribed inadequately or may not  be corrected upon review.

## 2023-02-24 ENCOUNTER — Encounter: Payer: Self-pay | Admitting: Gastroenterology

## 2023-02-24 ENCOUNTER — Ambulatory Visit (INDEPENDENT_AMBULATORY_CARE_PROVIDER_SITE_OTHER): Payer: HMO | Admitting: Gastroenterology

## 2023-02-24 VITALS — BP 149/81 | HR 84 | Temp 98.5°F | Ht 59.0 in | Wt 104.2 lb

## 2023-02-24 DIAGNOSIS — E2749 Other adrenocortical insufficiency: Secondary | ICD-10-CM | POA: Diagnosis not present

## 2023-02-24 DIAGNOSIS — Z8719 Personal history of other diseases of the digestive system: Secondary | ICD-10-CM | POA: Diagnosis not present

## 2023-02-24 DIAGNOSIS — E2740 Unspecified adrenocortical insufficiency: Secondary | ICD-10-CM | POA: Diagnosis not present

## 2023-02-24 NOTE — Progress Notes (Signed)
Arlyss Repress, MD [address]  [address]  [place], [place] [address]  Main: [phone]  Fax: [phone]    Gastroenterology Consultation  Referring Provider:     Eustaquio Boyden, MD Primary Care Physician:  Eustaquio Boyden, MD Primary Gastroenterologist:  Dr. Arlyss Repress Reason for Consultation:   Small bowel Crohn's        HPI:   Michelle Vasquez is a 72 y.o. female referred by Dr. Eustaquio Boyden, MD  for consultation & management of small bowel Crohn's  Initial visit 10/29/2022 Michelle Vasquez is a pleasant 72 year old female with history of Crohn's disease.  She is currently maintained on mercaptopurine 50 mg daily and budesonide 3 mg daily.  Her main symptom is chronic nonbloody diarrhea, has bowel movements up to 6 times daily for which she takes Imodium as needed which leads to constipation.  Her weight has been stable, reports good appetite.  She takes B12 shots and oral iron supplements.  She wanted to switch her care to [facility] to be within [facility] and stay locally.  Follow-up visit 02/24/2023 Since last visit, patient is diagnosed with secondary adrenal insufficiency, started on prednisone 5 mg daily 2 weeks ago.  Her serum cortisol levels were less than 5.  She reports that her diarrhea has significantly improved and she overall feels significantly better and her appetite has improved since she underwent ACTH stimulation test.  She is currently having 3-4 soft bowel movements compared to 7-10 watery bowel movements prior to the diagnosis of adrenal insufficiency.  She does not take Imodium.  Her weight has been steady.  She continues to take potassium and magnesium supplements.  Taking budesonide 3 mg daily.  She could not undergo EGD and colonoscopy due to significant electrolyte abnormalities   Crohn's disease classification:   Age: <17 Location: Ileal Behavior: stricturing Perianal:no   IBD diagnosis: small bowel Crohn's at age 79    Disease course: Small bowel involving terminal ileum Crohn's at age 15.  Underwent terminal ileum resection in 1981.  She had history of intermittent partial small bowel obstructions.  She has been maintained on low-dose 6-MP 50 mg daily since 2006.  She was found to have postop recurrence of Crohn's disease at the ilio colic anastomosis with narrowing.  Since 2021, patient is also maintained on budesonide 3 mg daily.  Her symptoms are not in remission.  Her symptoms include nonbloody diarrhea, up to 6 times daily associated with some abdominal bloating and pain.  She has chronic iron and B12 deficiency anemia, takes oral iron as well as B12 injections.  Last colonoscopy in 12/2019 revealed narrowing and ulceration of the ileocolonic anastomosis   Extra intestinal manifestations: None   IBD surgical history: Terminal ileal resection in 1981   Imaging:   MRE none CTE 11/25/2020 IMPRESSION: 1. Mild-to-moderate wall thickening and mild mucosal hyperenhancement in the terminal ileum, mildly worsened since 12/30/2018 CT enterography study, compatible with active Crohn disease. No evidence of bowel obstruction, fistula or abscess. No additional sites of active bowel inflammation. 2. Small hiatal hernia. 3. Aortic Atherosclerosis   SBFT none   Procedures:   Colonoscopy 01/15/2020 Perianal skin tags.  Not patent ileocolic anastomosis, ulcerated, biopsies performed 2 subcentimeter polyps in the colon, resected Pathology revealed tubular adenoma Ulcerated small bowel anastomotic mucosa   Upper Endoscopy 11/22/2017 - Benign-appearing esophageal stenosis. Dilated. - Esophagogastric landmarks identified. - Normal stomach. - Duodenal diverticulum. - The examination was otherwise normal. - No  specimens collected.   VCE none   IBD medications:   Steroids: Received prednisone in the past, currently on budesonide 3 mg daily 5-ASA: None Immunomodulators: AZA, methotrexate On 6-MP 50 mg daily  since 2006 TPMT status  Biologics: None Anti TNFs: Anti Integrins: Ustekinumab: Tofactinib: Clinical trial:     NSAIDs: None   Antiplts/Anticoagulants/Anti thrombotics: None   Past Medical History:  Diagnosis Date   Allergy    seasonal   Anxiety and depression 07/03/2011   B12 deficiency    Blood transfusion without reported diagnosis    Central hearing loss    COVID-19 virus infection 03/03/2021   Crohn disease (HCC)    s/p colectomy on , ?bacterial overgrowth   Fatty liver 12/15/2011   History of small bowel obstruction    HTN (hypertension)    Internal hemorrhoids without mention of complication    Osteomalacia, unspecified    Perennial allergic rhinitis with seasonal variation    Squamous cell cancer of skin of left hand 11/2022   Dr Juanita Craver   Unspecified deficiency anemia    Unspecified polyarthropathy or polyarthritis, multiple sites     Past Surgical History:  Procedure Laterality Date   APPENDECTOMY     COLONOSCOPY  02/25/05   Internal hemms; crohns; narrow anast   COLONOSCOPY  02/26/10   Prior right hemicolectomy o/w benign (Dr. Juanda Chance)   COLONOSCOPY  02/2015   benign biopsies, crohn's with stenotic stricture at anastomosis Juanda Chance)   COLONOSCOPY  12/2019   small adenomas, crohn's disease small intestine, diverticulosis, non-patent end to side ileocolonic anastomosis with inflammation and ulceration biopsied, rpt 3 yrs (Brahmbhatt)   DEXA  07/06/2011   normal T score -1.0 femur, spine   ESOPHAGOGASTRODUODENOSCOPY  02/25/05   Dilated stricture   ESOPHAGOGASTRODUODENOSCOPY  10/2017   esophageal stenosis dilated, duodenal diverticuluc (Nandigam)   HEMICOLECTOMY  1980   all small intestine, none large   OTHER SURGICAL HISTORY  1960s, 1978, 1980   ileal resection x 3 and subtotal colon resection   TOTAL ABDOMINAL HYSTERECTOMY  1975   ovaries out as well   UPPER GASTROINTESTINAL ENDOSCOPY       Current Outpatient Medications:    budesonide (ENTOCORT EC) 3  MG 24 hr capsule, Take 3 mg by mouth daily., Disp: , Rfl:    Cholecalciferol (VITAMIN D) 50 MCG (2000 UT) CAPS, Take 1 capsule (2,000 Units total) by mouth daily., Disp: 90 capsule, Rfl: 3   cyanocobalamin (VITAMIN B12) 1000 MCG/ML injection, Inject 1 mL (1,000 mcg total) into the skin every 30 (thirty) days., Disp: 1 mL, Rfl: 11   ferrous sulfate (FEROSUL) 325 (65 FE) MG tablet, Take 1 tablet (325 mg total) by mouth every Monday, Wednesday, and Friday., Disp: , Rfl:    folic acid (FOLVITE) 800 MCG tablet, Take 1 tablet (800 mcg total) by mouth daily., Disp: , Rfl:    magnesium gluconate (MAGONATE) 500 MG tablet, Take 1 tablet (500 mg total) by mouth daily., Disp: 30 tablet, Rfl: 1   mercaptopurine (PURINETHOL) 50 MG tablet, TAKE 1 TABLET BY MOUTH DAILY 1 HOUR BEFORE OR 2 HOURS AFTER MEALS ON AN EMPTY STOMACH., Disp: 90 tablet, Rfl: 3   pantoprazole (PROTONIX) 40 MG tablet, Take 40 mg by mouth daily., Disp: , Rfl:    Potassium 99 MG TABS, Take 1 tablet (99 mg total) by mouth daily., Disp: 30 tablet, Rfl: 0   predniSONE (DELTASONE) 5 MG tablet, Take 1 tablet (5 mg total) by mouth daily with breakfast., Disp: 90  tablet, Rfl: 1   SYRINGE/NEEDLE, DISP, 1 ML 23G X 1" 1 ML MISC, Use as directed, Disp: 8 each, Rfl: 0   Family History  Problem Relation Age of Onset   Crohn's disease Father        colostomy   Hypertension Mother    Heart disease Mother 60   Alcohol abuse Brother    Cirrhosis Brother    Crohn's disease Sister    Crohn's disease Paternal Aunt        x 3   Crohn's disease Other        neice x 2   Colon cancer Neg Hx    Cancer Neg Hx    Diabetes Neg Hx    Rectal cancer Neg Hx    Stomach cancer Neg Hx    Esophageal cancer Neg Hx    Breast cancer Neg Hx      Social History   Tobacco Use   Smoking status: Former    Packs/day: 0.25    Years: 15.00    Additional pack years: 0.00    Total pack years: 3.75    Types: Cigarettes    Quit date: 10/26/1978    Years since quitting:  44.3   Smokeless tobacco: Never  Vaping Use   Vaping Use: Never used  Substance Use Topics   Alcohol use: No   Drug use: No    Allergies as of 02/24/2023 - Review Complete 02/24/2023  Allergen Reaction Noted   Celexa [citalopram hydrobromide] Other (See Comments) 01/10/2014    Review of Systems:    All systems reviewed and negative except where noted in HPI.   Physical Exam:  BP (!) 149/81 (BP Location: Left Arm, Patient Position: Sitting, Cuff Size: Normal)   Pulse 84   Temp 98.5 F (36.9 C) (Oral)   Ht 4\' 11"  (1.499 m)   Wt 104 lb 4 oz (47.3 kg)   BMI 21.06 kg/m  No LMP recorded. Patient has had a hysterectomy.  General:   Alert,  Well-developed, well-nourished, pleasant and cooperative in NAD Head:  Normocephalic and atraumatic. Eyes:  Sclera clear, no icterus.   Conjunctiva pink. Ears:  Normal auditory acuity. Nose:  No deformity, discharge, or lesions. Mouth:  No deformity or lesions,oropharynx pink & moist. Neck:  Supple; no masses or thyromegaly. Lungs:  Respirations even and unlabored.  Clear throughout to auscultation.   No wheezes, crackles, or rhonchi. No acute distress. Heart:  Regular rate and rhythm; no murmurs, clicks, rubs, or gallops. Abdomen:  Normal bowel sounds. Soft, non-tender and non-distended without masses, hepatosplenomegaly or hernias noted.  No guarding or rebound tenderness.   Rectal: Not performed Msk:  Symmetrical without gross deformities. Good, equal movement & strength bilaterally. Pulses:  Normal pulses noted. Extremities:  No clubbing or edema.  No cyanosis. Neurologic:  Alert and oriented x3;  grossly normal neurologically. Skin:  Intact without significant lesions or rashes. No jaundice. Psych:  Alert and cooperative. Normal mood and affect.  Imaging Studies: Reviewed  Assessment and Plan:   Michelle Vasquez is a 72 y.o. pleasant Caucasian female with small bowel Crohn's diagnosed at age 74, s/p ileocecal resection in 1981, with  postop recurrence at the anastomosis, currently maintained on mercaptopurine and budesonide.  Diagnosed with secondary renal insufficiency secondary to chronic steroid use for her Crohn's disease.  Fecal calprotectin levels were normal, celiac disease panel negative Recheck electrolytes today if normal, will proceed with upper endoscopy and colonoscopy to assess activity of Crohn's disease Advised  patient to stop budesonide Continue prednisone 5 mg daily per her endocrinologist for adrenal insufficiency Continue 6-MP 50 mg daily, LFTs have been normal  Follow up in 3 months   Arlyss Repress, MD

## 2023-02-25 ENCOUNTER — Telehealth: Payer: Self-pay

## 2023-02-25 ENCOUNTER — Other Ambulatory Visit: Payer: Self-pay | Admitting: Gastroenterology

## 2023-02-25 ENCOUNTER — Telehealth: Payer: Self-pay | Admitting: Gastroenterology

## 2023-02-25 DIAGNOSIS — E876 Hypokalemia: Secondary | ICD-10-CM

## 2023-02-25 DIAGNOSIS — E2749 Other adrenocortical insufficiency: Secondary | ICD-10-CM

## 2023-02-25 LAB — BASIC METABOLIC PANEL
BUN/Creatinine Ratio: 10 — ABNORMAL LOW (ref 12–28)
BUN: 9 mg/dL (ref 8–27)
CO2: 22 mmol/L (ref 20–29)
Calcium: 8.5 mg/dL — ABNORMAL LOW (ref 8.7–10.3)
Chloride: 103 mmol/L (ref 96–106)
Creatinine, Ser: 0.86 mg/dL (ref 0.57–1.00)
Glucose: 157 mg/dL — ABNORMAL HIGH (ref 70–99)
Potassium: 3.2 mmol/L — ABNORMAL LOW (ref 3.5–5.2)
Sodium: 145 mmol/L — ABNORMAL HIGH (ref 134–144)
eGFR: 72 mL/min/{1.73_m2} (ref >59)

## 2023-02-25 LAB — MAGNESIUM: Magnesium: 0.9 mg/dL — CL (ref 1.6–2.3)

## 2023-02-25 MED ORDER — POTASSIUM CHLORIDE 20 MEQ PO PACK
40.0000 meq | PACK | Freq: Every day | ORAL | 0 refills | Status: DC
Start: 2023-02-25 — End: 2023-04-20

## 2023-02-25 MED ORDER — SLOWMAG MG MUSCLE/HEART 71.5-119 MG PO TBEC
2.0000 | DELAYED_RELEASE_TABLET | Freq: Two times a day (BID) | ORAL | 2 refills | Status: AC
Start: 2023-02-25 — End: 2023-05-26

## 2023-02-25 NOTE — Telephone Encounter (Signed)
Called and left a message for call back  

## 2023-02-25 NOTE — Telephone Encounter (Signed)
Patient verbalized understanding of results. She states the pharmacy has called her and the Magnesium is 35 dollars and insurance does not cover it. She is going to try to get the medication but does not know if she is going to be able to pay for it. I will check with patient tomorrow to see if she got the medication. Patient will start taking the Imodium. Will call patient in 1 week to recheck labs

## 2023-02-25 NOTE — Telephone Encounter (Signed)
Document in other telephone call

## 2023-02-25 NOTE — Telephone Encounter (Signed)
Pt left vm message returning call  

## 2023-02-25 NOTE — Telephone Encounter (Signed)
-----   Message from Toney Reil, MD sent at 02/25/2023  1:51 PM EDT ----- Please inform patient that her magnesium levels and potassium levels are low.  I would like her to switch to sustained-release preparations of magnesium because they are slowly absorbed and better tolerable.  Sent Slow-Mag to the pharmacy, she has to take 2 tablets twice daily instead of current magnesium supplement. Would like to replace her current potassium supplement to tailor potassium powder packet that she has to take 2 packets mix in water daily  Recheck serum magnesium and potassium levels in 1 week  Also, she reported she was still having 3-4 diarrheal episodes throughout the day, I would like her to take Imodium 1 to 2 tablets daily to make sure she has only 1 or 2 semiformed bowel movements daily.  Diarrhea leads to low magnesium and potassium levels  Rohini Vanga

## 2023-02-26 NOTE — Telephone Encounter (Signed)
Patient is going to pick up the Magnesium and potassium this morning

## 2023-03-04 ENCOUNTER — Telehealth: Payer: Self-pay

## 2023-03-04 DIAGNOSIS — E876 Hypokalemia: Secondary | ICD-10-CM

## 2023-03-04 DIAGNOSIS — E2749 Other adrenocortical insufficiency: Secondary | ICD-10-CM

## 2023-03-04 DIAGNOSIS — R790 Abnormal level of blood mineral: Secondary | ICD-10-CM

## 2023-03-04 NOTE — Telephone Encounter (Signed)
-----   Message from Denman George, CMA sent at 02/25/2023  3:35 PM EDT ----- Recheck serum magnesium and potassium levels in 1 week

## 2023-03-04 NOTE — Telephone Encounter (Signed)
Patient will go tomorrow to have her labs done

## 2023-03-05 DIAGNOSIS — E2749 Other adrenocortical insufficiency: Secondary | ICD-10-CM | POA: Diagnosis not present

## 2023-03-05 DIAGNOSIS — E876 Hypokalemia: Secondary | ICD-10-CM | POA: Diagnosis not present

## 2023-03-05 DIAGNOSIS — R790 Abnormal level of blood mineral: Secondary | ICD-10-CM | POA: Diagnosis not present

## 2023-03-06 LAB — MAGNESIUM: Magnesium: 1.2 mg/dL — ABNORMAL LOW (ref 1.6–2.3)

## 2023-03-06 LAB — POTASSIUM: Potassium: 4.1 mmol/L (ref 3.5–5.2)

## 2023-03-08 ENCOUNTER — Telehealth: Payer: Self-pay

## 2023-03-08 DIAGNOSIS — R790 Abnormal level of blood mineral: Secondary | ICD-10-CM

## 2023-03-08 DIAGNOSIS — E876 Hypokalemia: Secondary | ICD-10-CM

## 2023-03-08 NOTE — Telephone Encounter (Signed)
-----   Message from Rohini Reddy Vanga, MD sent at 03/08/2023 10:20 AM EDT ----- Please check with patient if she is still having diarrhea and how many bowel movements and consistency of the stools.    Her potassium levels are normal.  Decrease potassium packet to 1 packet instead of 2 packets daily.  Her serum magnesium levels have improved but still below normal.  Continue taking magnesium at current dose and recheck serum magnesium and potassium in 1 week  Thanks RV 

## 2023-03-08 NOTE — Telephone Encounter (Signed)
-----   Message from Toney Reil, MD sent at 03/08/2023 10:20 AM EDT ----- Please check with patient if she is still having diarrhea and how many bowel movements and consistency of the stools.    Her potassium levels are normal.  Decrease potassium packet to 1 packet instead of 2 packets daily.  Her serum magnesium levels have improved but still below normal.  Continue taking magnesium at current dose and recheck serum magnesium and potassium in 1 week  Thanks RV

## 2023-03-08 NOTE — Telephone Encounter (Signed)
Patient is not having the diarrhea any more. Is taking 2 Imodium every morning and she does not have the diarrhea. Patient verbalized understanding of results and she will have labs done in 1 week

## 2023-03-15 DIAGNOSIS — E876 Hypokalemia: Secondary | ICD-10-CM | POA: Diagnosis not present

## 2023-03-15 DIAGNOSIS — R790 Abnormal level of blood mineral: Secondary | ICD-10-CM | POA: Diagnosis not present

## 2023-03-16 LAB — MAGNESIUM: Magnesium: 1.3 mg/dL — ABNORMAL LOW (ref 1.6–2.3)

## 2023-03-16 LAB — POTASSIUM: Potassium: 4.7 mmol/L (ref 3.5–5.2)

## 2023-03-17 ENCOUNTER — Telehealth: Payer: Self-pay

## 2023-03-17 NOTE — Telephone Encounter (Signed)
Patient verbalized understanding of results  

## 2023-03-17 NOTE — Telephone Encounter (Signed)
-----   Message from Toney Reil, MD sent at 03/17/2023  3:45 PM EDT ----- Continue magnesium at current dose as magnesium levels are still low.  She can stop taking potassium  RV

## 2023-04-20 ENCOUNTER — Encounter: Payer: Self-pay | Admitting: Family Medicine

## 2023-04-20 ENCOUNTER — Ambulatory Visit (INDEPENDENT_AMBULATORY_CARE_PROVIDER_SITE_OTHER): Payer: HMO | Admitting: Family Medicine

## 2023-04-20 VITALS — BP 160/86 | HR 81 | Temp 97.9°F | Ht 59.0 in | Wt 103.5 lb

## 2023-04-20 DIAGNOSIS — R531 Weakness: Secondary | ICD-10-CM

## 2023-04-20 DIAGNOSIS — K50118 Crohn's disease of large intestine with other complication: Secondary | ICD-10-CM

## 2023-04-20 DIAGNOSIS — I160 Hypertensive urgency: Secondary | ICD-10-CM | POA: Insufficient documentation

## 2023-04-20 DIAGNOSIS — E2740 Unspecified adrenocortical insufficiency: Secondary | ICD-10-CM | POA: Diagnosis not present

## 2023-04-20 DIAGNOSIS — D508 Other iron deficiency anemias: Secondary | ICD-10-CM

## 2023-04-20 LAB — CBC WITH DIFFERENTIAL/PLATELET
Basophils Absolute: 0 10*3/uL (ref 0.0–0.1)
Basophils Relative: 0.4 % (ref 0.0–3.0)
Eosinophils Absolute: 0.1 10*3/uL (ref 0.0–0.7)
Eosinophils Relative: 0.5 % (ref 0.0–5.0)
HCT: 41.1 % (ref 36.0–46.0)
Hemoglobin: 13.3 g/dL (ref 12.0–15.0)
Lymphocytes Relative: 18.2 % (ref 12.0–46.0)
Lymphs Abs: 2 10*3/uL (ref 0.7–4.0)
MCHC: 32.3 g/dL (ref 30.0–36.0)
MCV: 98.6 fl (ref 78.0–100.0)
Monocytes Absolute: 0.6 10*3/uL (ref 0.1–1.0)
Monocytes Relative: 5.2 % (ref 3.0–12.0)
Neutro Abs: 8.5 10*3/uL — ABNORMAL HIGH (ref 1.4–7.7)
Neutrophils Relative %: 75.7 % (ref 43.0–77.0)
Platelets: 275 10*3/uL (ref 150.0–400.0)
RBC: 4.17 Mil/uL (ref 3.87–5.11)
RDW: 16.5 % — ABNORMAL HIGH (ref 11.5–15.5)
WBC: 11.2 10*3/uL — ABNORMAL HIGH (ref 4.0–10.5)

## 2023-04-20 LAB — COMPREHENSIVE METABOLIC PANEL
ALT: 30 U/L (ref 0–35)
AST: 29 U/L (ref 0–37)
Albumin: 4.2 g/dL (ref 3.5–5.2)
Alkaline Phosphatase: 77 U/L (ref 39–117)
BUN: 13 mg/dL (ref 6–23)
CO2: 29 mEq/L (ref 19–32)
Calcium: 9.9 mg/dL (ref 8.4–10.5)
Chloride: 101 mEq/L (ref 96–112)
Creatinine, Ser: 0.9 mg/dL (ref 0.40–1.20)
GFR: 63.8 mL/min (ref >60.00)
Glucose, Bld: 92 mg/dL (ref 70–99)
Potassium: 3.9 mEq/L (ref 3.5–5.1)
Sodium: 140 mEq/L (ref 135–145)
Total Bilirubin: 0.5 mg/dL (ref 0.2–1.2)
Total Protein: 7.7 g/dL (ref 6.0–8.3)

## 2023-04-20 LAB — IBC PANEL
Iron: 84 ug/dL (ref 42–145)
Saturation Ratios: 15.4 % — ABNORMAL LOW (ref 20.0–50.0)
TIBC: 546 ug/dL — ABNORMAL HIGH (ref 250.0–450.0)
Transferrin: 390 mg/dL — ABNORMAL HIGH (ref 212.0–360.0)

## 2023-04-20 LAB — MAGNESIUM: Magnesium: 1.4 mg/dL — ABNORMAL LOW (ref 1.5–2.5)

## 2023-04-20 LAB — TSH: TSH: 6.52 u[IU]/mL — ABNORMAL HIGH (ref 0.35–5.50)

## 2023-04-20 LAB — FERRITIN: Ferritin: 24 ng/mL (ref 10.0–291.0)

## 2023-04-20 MED ORDER — AMLODIPINE BESYLATE 5 MG PO TABS
5.0000 mg | ORAL_TABLET | Freq: Every day | ORAL | 3 refills | Status: DC
Start: 2023-04-20 — End: 2023-12-14

## 2023-04-20 MED ORDER — CLONIDINE HCL 0.1 MG PO TABS
0.1000 mg | ORAL_TABLET | Freq: Once | ORAL | Status: AC
Start: 2023-04-20 — End: 2023-04-20
  Administered 2023-04-20: 0.1 mg via ORAL

## 2023-04-20 NOTE — Assessment & Plan Note (Signed)
Update levels on SlowMag/calcium 2 tab BID.

## 2023-04-20 NOTE — Patient Instructions (Signed)
Clonidine 0.1mg  in office today  Labs today  Restart amlodipine 5mg  daily - sent to pharmacy.  Monitor blood pressures at home, let me know how they are running.  Limit salt/sodium in diet, continue drinking plenty of fluid, potassium rich diet (fruits/vegetables).  Return in 1 week for hypertension follow up visit.

## 2023-04-20 NOTE — Assessment & Plan Note (Addendum)
Received Venofer infusion x5 03/2022 Intolerant of oral iron.  Update CBC, iron panel.

## 2023-04-20 NOTE — Progress Notes (Signed)
Ph: [phone] Fax: [phone]   Patient ID: Michelle Vasquez, female    DOB: Sep 17, 1951, 72 y.o.   MRN: [id number]  This visit was conducted in person.  BP (!) 160/86   Pulse 81   Temp 97.9 F (36.6 C) (Temporal)   Ht 4\' 11"  (1.499 m)   Wt 103 lb 8 oz (46.9 kg)   SpO2 97%   BMI 20.90 kg/m   BP Readings from Last 3 Encounters:  04/20/23 (!) 160/86  02/24/23 (!) 149/81  02/09/23 132/76  210/90 on retesting Significant improvement after clonidine.   CC: not feeling well Subjective:   HPI: Michelle Vasquez is a 72 y.o. female presenting on 04/20/2023 for Weakness (C/o general weakness, off balance and tremors in B arms. Also, c/o recent elevated BP reading. Started about 04/15/23.)   5d h/o feeling weak all over - malaise, fatigue, imbalance, bilateral arm tremors. More nervous and anxious. Mild dyspnea.  Bowels have normalized - about 1 soft stool/day. She is taking imodium every day.  No falls, no dizziness/vertigo. No fevers/chills, cough, chest pain, HA, new abd pain. No unilateral weakness, paresthesias or numbness, slurred speech.  No noted hyper or hypothermia.  Notes cold intolerance.   BP markedly elevated today. Has not been checking BP regularly, did check this morning at home and similar to today's reading.   She was recently diagnosed with secondary adrenal insufficiency by endocrinology thought due to chronic steroid exposure for longstanding Crohn's, started on lifelong low dose prednisone 5mg  daily. Amlodipine 5mg  was held 01/2023.   Crohn's followed by Dr Allegra Lai on mercaptopurine 50mg  daily. Plan was rpt EGD/colonoscopy. Has been receiving magnesium and potassium supplementation, currently on SlowMag/calcium 71.5/119mg  2 tab BID. Potassium was stopped last month.   Trouble tolerating oral iron due to GI upset. Last iron infusion was about 1 year ago.      Relevant past medical, surgical, family and social history reviewed and updated as indicated. Interim  medical history since our last visit reviewed. Allergies and medications reviewed and updated. Outpatient Medications Prior to Visit  Medication Sig Dispense Refill   Cholecalciferol (VITAMIN D) 50 MCG (2000 UT) CAPS Take 1 capsule (2,000 Units total) by mouth daily. 90 capsule 3   cyanocobalamin (VITAMIN B12) 1000 MCG/ML injection Inject 1 mL (1,000 mcg total) into the skin every 30 (thirty) days. 1 mL 11   ferrous sulfate (FEROSUL) 325 (65 FE) MG tablet Take 1 tablet (325 mg total) by mouth every Monday, Wednesday, and Friday.     folic acid (FOLVITE) 800 MCG tablet Take 1 tablet (800 mcg total) by mouth daily.     magnesium chloride (SLOWMAG MG MUSCLE/HEART) 64 MG TBEC SR tablet Take 2 tablets (128 mg total) by mouth 2 (two) times daily. 120 tablet 2   pantoprazole (PROTONIX) 40 MG tablet Take 40 mg by mouth daily.     predniSONE (DELTASONE) 5 MG tablet Take 1 tablet (5 mg total) by mouth daily with breakfast. 90 tablet 1   SYRINGE/NEEDLE, DISP, 1 ML 23G X 1" 1 ML MISC Use as directed 8 each 0   mercaptopurine (PURINETHOL) 50 MG tablet TAKE 1 TABLET BY MOUTH DAILY 1 HOUR BEFORE OR 2 HOURS AFTER MEALS ON AN EMPTY STOMACH. 90 tablet 3   potassium chloride (KLOR-CON) 20 MEQ packet Take 40 mEq by mouth daily. 60 packet 0   No facility-administered medications prior to visit.     Per HPI unless specifically indicated in ROS section below Review  of Systems  Objective:  BP (!) 160/86   Pulse 81   Temp 97.9 F (36.6 C) (Temporal)   Ht 4\' 11"  (1.499 m)   Wt 103 lb 8 oz (46.9 kg)   SpO2 97%   BMI 20.90 kg/m   Wt Readings from Last 3 Encounters:  04/20/23 103 lb 8 oz (46.9 kg)  02/24/23 104 lb 4 oz (47.3 kg)  02/09/23 106 lb (48.1 kg)      Physical Exam Vitals and nursing note reviewed.  Constitutional:      Appearance: Normal appearance. She is not ill-appearing.  HENT:     Head: Normocephalic and atraumatic.     Mouth/Throat:     Mouth: Mucous membranes are moist.     Pharynx:  Oropharynx is clear. No oropharyngeal exudate or posterior oropharyngeal erythema.  Eyes:     Extraocular Movements: Extraocular movements intact.     Pupils: Pupils are equal, round, and reactive to light.  Cardiovascular:     Rate and Rhythm: Normal rate and regular rhythm.     Pulses: Normal pulses.     Heart sounds: Normal heart sounds. No murmur heard. Pulmonary:     Effort: Pulmonary effort is normal. No respiratory distress.     Breath sounds: Normal breath sounds. No wheezing, rhonchi or rales.  Musculoskeletal:     Cervical back: Normal range of motion and neck supple.  Skin:    General: Skin is warm and dry.     Findings: No rash.  Neurological:     General: No focal deficit present.     Mental Status: She is alert.     Cranial Nerves: Cranial nerves 2-12 are intact.     Sensory: Sensation is intact.     Motor: Motor function is intact.     Coordination: Coordination is intact.     Gait: Gait is intact.     Comments:  CN 2-12 intact FTN intact EOMI  No pronator drift  Psychiatric:        Mood and Affect: Mood normal.        Behavior: Behavior normal.       Results for orders placed or performed in visit on 04/20/23  CBC with Differential/Platelet  Result Value Ref Range   WBC 11.2 (H) 4.0 - 10.5 K/uL   RBC 4.17 3.87 - 5.11 Mil/uL   Hemoglobin 13.3 12.0 - 15.0 g/dL   HCT 16.1 09.6 - 04.5 %   MCV 98.6 78.0 - 100.0 fl   MCHC 32.3 30.0 - 36.0 g/dL   RDW 40.9 (H) 81.1 - 91.4 %   Platelets 275.0 150.0 - 400.0 K/uL   Neutrophils Relative % 75.7 43.0 - 77.0 %   Lymphocytes Relative 18.2 12.0 - 46.0 %   Monocytes Relative 5.2 3.0 - 12.0 %   Eosinophils Relative 0.5 0.0 - 5.0 %   Basophils Relative 0.4 0.0 - 3.0 %   Neutro Abs 8.5 (H) 1.4 - 7.7 K/uL   Lymphs Abs 2.0 0.7 - 4.0 K/uL   Monocytes Absolute 0.6 0.1 - 1.0 K/uL   Eosinophils Absolute 0.1 0.0 - 0.7 K/uL   Basophils Absolute 0.0 0.0 - 0.1 K/uL  Comprehensive metabolic panel  Result Value Ref Range    Sodium 140 135 - 145 mEq/L   Potassium 3.9 3.5 - 5.1 mEq/L   Chloride 101 96 - 112 mEq/L   CO2 29 19 - 32 mEq/L   Glucose, Bld 92 70 - 99 mg/dL   BUN 13  6 - 23 mg/dL   Creatinine, Ser 7.56 0.40 - 1.20 mg/dL   Total Bilirubin 0.5 0.2 - 1.2 mg/dL   Alkaline Phosphatase 77 39 - 117 U/L   AST 29 0 - 37 U/L   ALT 30 0 - 35 U/L   Total Protein 7.7 6.0 - 8.3 g/dL   Albumin 4.2 3.5 - 5.2 g/dL   GFR 43.32 >95.18 mL/min   Calcium 9.9 8.4 - 10.5 mg/dL  Magnesium  Result Value Ref Range   Magnesium 1.4 (L) 1.5 - 2.5 mg/dL   Lab Results  Component Value Date   TSH 2.13 03/20/2022    Assessment & Plan:   Problem List Items Addressed This Visit     Iron deficiency anemia    Received Venofer infusion x5 03/2022 Intolerant of oral iron.  Update CBC, iron panel.       Relevant Orders   Ferritin   IBC panel   Crohn's disease (HCC)    Appreciate GI care.  Pending rpt EGD/colonoscopy      Weakness generalized   Relevant Orders   TSH   CBC with Differential/Platelet (Completed)   Comprehensive metabolic panel (Completed)   Cortisol-am, blood   Ferritin   IBC panel   Hypomagnesemia    Update levels on SlowMag/calcium 2 tab BID.       Relevant Orders   Magnesium (Completed)   Hypocalcemia   Relevant Orders   Parathyroid hormone, intact (no Ca)   Adrenal insufficiency (HCC)    Update 11am cortisol level on prednisone 5mg  daily given marked malaise as per above.       Relevant Orders   Parathyroid hormone, intact (no Ca)   Cortisol-am, blood   Hypertensive urgency - Primary    Markedly elevated BP treated in office with clonidine 0.1mg  PO x1 with good effect, BP down to 160 systolic.   Nonfocal neurological exam.  Restart amlodipine 5mg  daily, RTC 1 wk HTN f/u visit.  ER precautions reviewed.  Labwork today.       Relevant Medications   amLODipine (NORVASC) 5 MG tablet   Other Relevant Orders   TSH   CBC with Differential/Platelet (Completed)   Comprehensive  metabolic panel (Completed)   Cortisol-am, blood     Meds ordered this encounter  Medications   cloNIDine (CATAPRES) tablet 0.1 mg   amLODipine (NORVASC) 5 MG tablet    Sig: Take 1 tablet (5 mg total) by mouth daily.    Dispense:  90 tablet    Refill:  3    Orders Placed This Encounter  Procedures   TSH   Parathyroid hormone, intact (no Ca)   CBC with Differential/Platelet   Comprehensive metabolic panel   Cortisol-am, blood   Magnesium   Ferritin   IBC panel    Patient Instructions  Clonidine 0.1mg  in office today  Labs today  Restart amlodipine 5mg  daily - sent to pharmacy.  Monitor blood pressures at home, let me know how they are running.  Limit salt/sodium in diet, continue drinking plenty of fluid, potassium rich diet (fruits/vegetables).  Return in 1 week for hypertension follow up visit.   Follow up plan: Return in about 1 week (around 04/27/2023) for follow up visit.  Eustaquio Boyden, MD

## 2023-04-20 NOTE — Assessment & Plan Note (Addendum)
Update 11am cortisol level on prednisone 5mg  daily given marked malaise as per above.

## 2023-04-20 NOTE — Assessment & Plan Note (Addendum)
Markedly elevated BP treated in office with clonidine 0.1mg  PO x1 with good effect, BP down to 160 systolic.   Nonfocal neurological exam.  Restart amlodipine 5mg  daily, RTC 1 wk HTN f/u visit.  ER precautions reviewed.  Labwork today.

## 2023-04-20 NOTE — Assessment & Plan Note (Signed)
Appreciate GI care.  Pending rpt EGD/colonoscopy

## 2023-04-21 LAB — CORTISOL-AM, BLOOD: Cortisol - AM: 12.5 ug/dL

## 2023-04-21 LAB — PARATHYROID HORMONE, INTACT (NO CA): PTH: 42 pg/mL (ref 16–77)

## 2023-04-27 ENCOUNTER — Ambulatory Visit (INDEPENDENT_AMBULATORY_CARE_PROVIDER_SITE_OTHER): Payer: HMO | Admitting: Family Medicine

## 2023-04-27 ENCOUNTER — Encounter: Payer: Self-pay | Admitting: Family Medicine

## 2023-04-27 VITALS — BP 162/86 | HR 80 | Temp 97.1°F | Ht 59.0 in | Wt 104.1 lb

## 2023-04-27 DIAGNOSIS — E538 Deficiency of other specified B group vitamins: Secondary | ICD-10-CM | POA: Diagnosis not present

## 2023-04-27 DIAGNOSIS — R531 Weakness: Secondary | ICD-10-CM | POA: Diagnosis not present

## 2023-04-27 DIAGNOSIS — R251 Tremor, unspecified: Secondary | ICD-10-CM | POA: Diagnosis not present

## 2023-04-27 DIAGNOSIS — E559 Vitamin D deficiency, unspecified: Secondary | ICD-10-CM

## 2023-04-27 DIAGNOSIS — R7989 Other specified abnormal findings of blood chemistry: Secondary | ICD-10-CM

## 2023-04-27 DIAGNOSIS — K50118 Crohn's disease of large intestine with other complication: Secondary | ICD-10-CM | POA: Diagnosis not present

## 2023-04-27 DIAGNOSIS — I10 Essential (primary) hypertension: Secondary | ICD-10-CM

## 2023-04-27 DIAGNOSIS — I160 Hypertensive urgency: Secondary | ICD-10-CM

## 2023-04-27 DIAGNOSIS — E2740 Unspecified adrenocortical insufficiency: Secondary | ICD-10-CM

## 2023-04-27 MED ORDER — PROPRANOLOL HCL 20 MG PO TABS: 20.0000 mg | ORAL_TABLET | Freq: Two times a day (BID) | ORAL | 3 refills | Status: DC

## 2023-04-27 MED ORDER — SLOW RELEASE IRON 160 (50 FE) MG PO TBCR: 1.0000 | EXTENDED_RELEASE_TABLET | ORAL | 3 refills | Status: DC

## 2023-04-27 NOTE — Patient Instructions (Addendum)
We will recheck thyroid levels with physical labs.  Trial once or twice weekly slow release iron sent to pharmacy Continue amlodipine, add propranolol 20mg  twice daily for blood pressure and tremor.  Return for physical/labs.

## 2023-04-27 NOTE — Assessment & Plan Note (Addendum)
Levels slowly improving - continue slowmag/cal replacement.

## 2023-04-27 NOTE — Assessment & Plan Note (Signed)
Chronic, improving but still above goal. Will continue amlodipine 5mg  daily, add propranolol 20mg  BID for BP and tremor as per below.  Consider ACEI/ARB if further med needed, avoid diuretic and aldosterone antagonist in adrenal insufficiency.

## 2023-04-27 NOTE — Assessment & Plan Note (Signed)
Deteriorated over the past month.  Presumed essential tremor present since at least 2019.  Trial propranolol 20mg  BID as per above

## 2023-04-27 NOTE — Progress Notes (Signed)
Ph: [phone] Fax: [phone]   Patient ID: Michelle Vasquez, female    DOB: 1951/09/15, 72 y.o.   MRN: [id number]  This visit was conducted in person.  BP (!) 162/86   Pulse 80   Temp (!) 97.1 F (36.2 C) (Temporal)   Ht 4\' 11"  (1.499 m)   Wt 104 lb 2 oz (47.2 kg)   SpO2 95%   BMI 21.03 kg/m    CC: 1 wk HTN f/u visit  Subjective:   HPI: Michelle Vasquez is a 72 y.o. female presenting on 04/27/2023 for Medical Management of Chronic Issues (Here for 1 wk HTN f/u.)   See prior note for details.  Recent malaise, weakness, imbalance. Found to have markedly elevated blood pressures. Last visit we started amlodipine, tolerating well without ankle edema. Workup showed improving hypomagnesemia, mildly low iron and low thyroid function (?sick euthyroid).   Overall feeling better since BP better controlled.  Still weak some.  Notes ongoing tremor- worse in the mornings or when driving/holding plate. Previously had slight tremor since age 72 ?essential tremor.   HTN - Compliant with current antihypertensive regimen of amlodipine 5mg  daily. Does check blood pressures at home and brings log: 140-177/79-104. No low blood pressure readings or symptoms of dizziness/syncope.  Denies HA, vision changes, CP/tightness, SOB, leg swelling.    Recently diagnosed with secondary adrenal insufficiency by endocrinology thought due to chronic steroid exposure for longstanding Crohn's, started on lifelong low dose prednisone 5mg  daily. She takes prednisone 5mg  daily in am with breakfast. Amlodipine 5mg  was previously held 01/2023. Endocrinology f/u scheduled for 05/2023.   Crohn's followed by Dr Allegra Lai on mercaptopurine 50mg  daily.  Continues SlowMag/calcium 71.5/119 mg 2 tab BID.      Relevant past medical, surgical, family and social history reviewed and updated as indicated. Interim medical history since our last visit reviewed. Allergies and medications reviewed and updated. Outpatient Medications  Prior to Visit  Medication Sig Dispense Refill   amLODipine (NORVASC) 5 MG tablet Take 1 tablet (5 mg total) by mouth daily. 90 tablet 3   Cholecalciferol (VITAMIN D) 50 MCG (2000 UT) CAPS Take 1 capsule (2,000 Units total) by mouth daily. 90 capsule 3   cyanocobalamin (VITAMIN B12) 1000 MCG/ML injection Inject 1 mL (1,000 mcg total) into the skin every 30 (thirty) days. 1 mL 11   folic acid (FOLVITE) 800 MCG tablet Take 1 tablet (800 mcg total) by mouth daily.     magnesium chloride (SLOWMAG MG MUSCLE/HEART) 64 MG TBEC SR tablet Take 2 tablets (128 mg total) by mouth 2 (two) times daily. 120 tablet 2   pantoprazole (PROTONIX) 40 MG tablet Take 40 mg by mouth daily.     predniSONE (DELTASONE) 5 MG tablet Take 1 tablet (5 mg total) by mouth daily with breakfast. 90 tablet 1   SYRINGE/NEEDLE, DISP, 1 ML 23G X 1" 1 ML MISC Use as directed 8 each 0   ferrous sulfate (FEROSUL) 325 (65 FE) MG tablet Take 1 tablet (325 mg total) by mouth every Monday, Wednesday, and Friday.     No facility-administered medications prior to visit.     Per HPI unless specifically indicated in ROS section below Review of Systems  Objective:  BP (!) 162/86   Pulse 80   Temp (!) 97.1 F (36.2 C) (Temporal)   Ht 4\' 11"  (1.499 m)   Wt 104 lb 2 oz (47.2 kg)   SpO2 95%   BMI 21.03 kg/m   Wt Readings  from Last 3 Encounters:  04/27/23 104 lb 2 oz (47.2 kg)  04/20/23 103 lb 8 oz (46.9 kg)  02/24/23 104 lb 4 oz (47.3 kg)      Physical Exam Vitals and nursing note reviewed.  Constitutional:      Appearance: Normal appearance. She is not ill-appearing.  HENT:     Head: Normocephalic and atraumatic.     Mouth/Throat:     Mouth: Mucous membranes are moist.     Pharynx: Oropharynx is clear. No oropharyngeal exudate or posterior oropharyngeal erythema.  Eyes:     Extraocular Movements: Extraocular movements intact.     Pupils: Pupils are equal, round, and reactive to light.  Cardiovascular:     Rate and Rhythm:  Normal rate and regular rhythm.     Pulses: Normal pulses.     Heart sounds: Normal heart sounds. No murmur heard. Pulmonary:     Effort: Pulmonary effort is normal. No respiratory distress.     Breath sounds: Normal breath sounds. No wheezing, rhonchi or rales.  Musculoskeletal:     Right lower leg: No edema.     Left lower leg: No edema.  Skin:    General: Skin is warm and dry.     Findings: No rash.  Neurological:     Mental Status: She is alert.     Comments: Action tremor present to bilateral hands       Results for orders placed or performed in visit on 04/20/23  TSH  Result Value Ref Range   TSH 6.52 (H) 0.35 - 5.50 uIU/mL  Parathyroid hormone, intact (no Ca)  Result Value Ref Range   PTH 42 16 - 77 pg/mL  CBC with Differential/Platelet  Result Value Ref Range   WBC 11.2 (H) 4.0 - 10.5 K/uL   RBC 4.17 3.87 - 5.11 Mil/uL   Hemoglobin 13.3 12.0 - 15.0 g/dL   HCT 40.9 81.1 - 91.4 %   MCV 98.6 78.0 - 100.0 fl   MCHC 32.3 30.0 - 36.0 g/dL   RDW 78.2 (H) 95.6 - 21.3 %   Platelets 275.0 150.0 - 400.0 K/uL   Neutrophils Relative % 75.7 43.0 - 77.0 %   Lymphocytes Relative 18.2 12.0 - 46.0 %   Monocytes Relative 5.2 3.0 - 12.0 %   Eosinophils Relative 0.5 0.0 - 5.0 %   Basophils Relative 0.4 0.0 - 3.0 %   Neutro Abs 8.5 (H) 1.4 - 7.7 K/uL   Lymphs Abs 2.0 0.7 - 4.0 K/uL   Monocytes Absolute 0.6 0.1 - 1.0 K/uL   Eosinophils Absolute 0.1 0.0 - 0.7 K/uL   Basophils Absolute 0.0 0.0 - 0.1 K/uL  Comprehensive metabolic panel  Result Value Ref Range   Sodium 140 135 - 145 mEq/L   Potassium 3.9 3.5 - 5.1 mEq/L   Chloride 101 96 - 112 mEq/L   CO2 29 19 - 32 mEq/L   Glucose, Bld 92 70 - 99 mg/dL   BUN 13 6 - 23 mg/dL   Creatinine, Ser 0.86 0.40 - 1.20 mg/dL   Total Bilirubin 0.5 0.2 - 1.2 mg/dL   Alkaline Phosphatase 77 39 - 117 U/L   AST 29 0 - 37 U/L   ALT 30 0 - 35 U/L   Total Protein 7.7 6.0 - 8.3 g/dL   Albumin 4.2 3.5 - 5.2 g/dL   GFR 57.84 >69.62 mL/min    Calcium 9.9 8.4 - 10.5 mg/dL  Cortisol-am, blood  Result Value Ref Range   Cortisol -  AM 12.5 mcg/dL  Magnesium  Result Value Ref Range   Magnesium 1.4 (L) 1.5 - 2.5 mg/dL  Ferritin  Result Value Ref Range   Ferritin 24.0 10.0 - 291.0 ng/mL  IBC panel  Result Value Ref Range   Iron 84 42 - 145 ug/dL   Transferrin 409.8 (H) 212.0 - 360.0 mg/dL   Saturation Ratios 11.9 (L) 20.0 - 50.0 %   TIBC 546.0 (H) 250.0 - 450.0 mcg/dL   Lab Results  Component Value Date   VITAMINB12 577 08/27/2022    Assessment & Plan:   Problem List Items Addressed This Visit     Vitamin B12 deficiency   Relevant Orders   Vitamin B12   Essential hypertension - Primary    Chronic, improving but still above goal. Will continue amlodipine 5mg  daily, add propranolol 20mg  BID for BP and tremor as per below.  Consider ACEI/ARB if further med needed, avoid diuretic and aldosterone antagonist in adrenal insufficiency.       Relevant Medications   propranolol (INDERAL) 20 MG tablet   Crohn's disease (HCC)   Relevant Orders   Comprehensive metabolic panel   Tremor    Deteriorated over the past month.  Presumed essential tremor present since at least 2019.  Trial propranolol 20mg  BID as per above      Vitamin D deficiency   Relevant Orders   VITAMIN D 25 Hydroxy (Vit-D Deficiency, Fractures)   Weakness generalized    This is improved with better BP control      Hypomagnesemia    Levels slowly improving - continue slowmag/cal replacement.      Relevant Orders   Magnesium   Adrenal insufficiency (HCC)    Appreciate endo care. She is now on prednisone 5mg  daily.       Hypertensive urgency    Improved - see above.       Relevant Medications   propranolol (INDERAL) 20 MG tablet   Other Visit Diagnoses     Abnormal TSH       Relevant Orders   TSH   T4, free   T3        Meds ordered this encounter  Medications   ferrous sulfate (SLOW RELEASE IRON) 160 (50 Fe) MG TBCR SR tablet     Sig: Take 1 tablet (160 mg total) by mouth 2 (two) times a week.    Dispense:  30 tablet    Refill:  3   propranolol (INDERAL) 20 MG tablet    Sig: Take 1 tablet (20 mg total) by mouth 2 (two) times daily.    Dispense:  60 tablet    Refill:  3    Orders Placed This Encounter  Procedures   Comprehensive metabolic panel    Standing Status:   Future    Standing Expiration Date:   04/26/2024   TSH    Standing Status:   Future    Standing Expiration Date:   04/26/2024   T4, free    Standing Status:   Future    Standing Expiration Date:   04/26/2024   T3    Standing Status:   Future    Standing Expiration Date:   04/26/2024   Vitamin B12    Standing Status:   Future    Standing Expiration Date:   04/26/2024   VITAMIN D 25 Hydroxy (Vit-D Deficiency, Fractures)    Standing Status:   Future    Standing Expiration Date:   04/26/2024   Magnesium  Standing Status:   Future    Standing Expiration Date:   04/26/2024    Patient Instructions  We will recheck thyroid levels with physical labs.  Trial once or twice weekly slow release iron sent to pharmacy Continue amlodipine, add propranolol 20mg  twice daily for blood pressure and tremor.  Return for physical/labs.   Follow up plan: Return if symptoms worsen or fail to improve.  Eustaquio Boyden, MD

## 2023-04-27 NOTE — Assessment & Plan Note (Signed)
Appreciate endo care. She is now on prednisone 5mg  daily.

## 2023-04-27 NOTE — Assessment & Plan Note (Addendum)
This is improved with better BP control

## 2023-04-27 NOTE — Assessment & Plan Note (Addendum)
Improved - see above

## 2023-05-10 ENCOUNTER — Telehealth: Payer: Self-pay | Admitting: *Deleted

## 2023-05-10 ENCOUNTER — Ambulatory Visit (INDEPENDENT_AMBULATORY_CARE_PROVIDER_SITE_OTHER): Payer: HMO

## 2023-05-10 VITALS — BP 132/74 | Ht 59.0 in | Wt 106.0 lb

## 2023-05-10 DIAGNOSIS — Z1231 Encounter for screening mammogram for malignant neoplasm of breast: Secondary | ICD-10-CM | POA: Diagnosis not present

## 2023-05-10 DIAGNOSIS — Z5941 Food insecurity: Secondary | ICD-10-CM | POA: Diagnosis not present

## 2023-05-10 DIAGNOSIS — Z5986 Financial insecurity: Secondary | ICD-10-CM

## 2023-05-10 DIAGNOSIS — Z0000 Encounter for general adult medical examination without abnormal findings: Secondary | ICD-10-CM

## 2023-05-10 NOTE — Progress Notes (Signed)
  Care Coordination   Note   05/10/2023 Name: Michelle Vasquez MRN: [id number] DOB: May 10, 1951  [name] is a 72 y.o. year old female who sees Eustaquio Boyden, MD for primary care. I reached out to [name] by phone today to offer care coordination services.  [name] was given information about Care Coordination services today including:   The Care Coordination services include support from the care team which includes your Nurse Coordinator, Clinical Social Worker, or Pharmacist.  The Care Coordination team is here to help remove barriers to the health concerns and goals most important to you. Care Coordination services are voluntary, and the patient may decline or stop services at any time by request to their care team member.   Care Coordination Consent Status: Patient agreed to services and verbal consent obtained.   Follow up plan:  Telephone appointment with care coordination team member scheduled for:  7/17 /2024  Encounter Outcome:  Pt. Scheduled from referral   Burman Nieves, [facility] Care Coordination Care Guide Direct Dial: [phone]

## 2023-05-10 NOTE — Patient Instructions (Signed)
Michelle Vasquez , Thank you for taking time to come for your Medicare Wellness Visit. I appreciate your ongoing commitment to your health goals. Please review the following plan we discussed and let me know if I can assist you in the future.   These are the goals we discussed:  Goals      Patient Stated     Starting 11/11/2018, I will continue to take medications as prescribed.      Patient Stated     11/20/2019, I will maintain and continue medications as prescribed.      Patient Stated     11/22/2020, I will continue walking my dog everyday for about 30 minutes.     Patient Stated     Stay active        This is a list of the screening recommended for you and due dates:  Health Maintenance  Topic Date Due   Zoster (Shingles) Vaccine (1 of 2) 08/21/2000   COVID-19 Vaccine (3 - 2023-24 season) 06/26/2022   Colon Cancer Screening  01/15/2023   Medicare Annual Wellness Visit  03/28/2023   Flu Shot  05/27/2023   Mammogram  06/10/2023   DTaP/Tdap/Td vaccine (3 - Td or Tdap) 06/11/2024   Pneumonia Vaccine  Completed   DEXA scan (bone density measurement)  Completed   Hepatitis C Screening  Completed   HPV Vaccine  Aged Out   You have an order for:  []   2D Mammogram  [x]   3D Mammogram  []   Bone Density     Please call for appointment:  [facility]  [address] [place] [address] [phone]  [facility] [address] [place], [place] [address] [phone]  [facility] [address] Geanie Logan [place], [place] [address] [phone]    Make sure to wear two-piece clothing.  No lotions, powders, or deodorants the day of the appointment. Make sure to bring picture ID and insurance card.  Bring list of medications you are currently taking including any supplements.   Schedule your [place] screening mammogram through MyChart!   Log into  your MyChart account.  Go to 'Visit' (or 'Appointments' if on mobile App) --> Schedule an Appointment  Under 'Select a Reason for Visit' choose the Mammogram Screening option.  Complete the pre-visit questions and select the time and place that best fits your schedule.    Advanced directives: Please bring a copy of your health care power of attorney and living will to the office to be added to your chart at your convenience.   Conditions/risks identified: Aim for 30 minutes of exercise or brisk walking, 6-8 glasses of water, and 5 servings of fruits and vegetables each day.   Next appointment: Follow up in one year for your annual wellness visit 05/10/24 @ 10:45 televisit   Preventive Care 65 Years and Older, Female Preventive care refers to lifestyle choices and visits with your health care provider that can promote health and wellness. What does preventive care include? A yearly physical exam. This is also called an annual well check. Dental exams once or twice a year. Routine eye exams. Ask your health care provider how often you should have your eyes checked. Personal lifestyle choices, including: Daily care of your teeth and gums. Regular physical activity. Eating a healthy diet. Avoiding tobacco and drug use. Limiting alcohol use. Practicing safe sex. Taking low-dose aspirin every day.  Taking vitamin and mineral supplements as recommended by your health care provider. What happens during an annual well check? The services and screenings done by your health care provider during your annual well check will depend on your age, overall health, lifestyle risk factors, and family history of disease. Counseling  Your health care provider may ask you questions about your: Alcohol use. Tobacco use. Drug use. Emotional well-being. Home and relationship well-being. Sexual activity. Eating habits. History of falls. Memory and ability to understand (cognition). Work and work  Astronomer. Reproductive health. Screening  You may have the following tests or measurements: Height, weight, and BMI. Blood pressure. Lipid and cholesterol levels. These may be checked every 5 years, or more frequently if you are over 65 years old. Skin check. Lung cancer screening. You may have this screening every year starting at age 79 if you have a 30-pack-year history of smoking and currently smoke or have quit within the past 15 years. Fecal occult blood test (FOBT) of the stool. You may have this test every year starting at age 64. Flexible sigmoidoscopy or colonoscopy. You may have a sigmoidoscopy every 5 years or a colonoscopy every 10 years starting at age 51. Hepatitis C blood test. Hepatitis B blood test. Sexually transmitted disease (STD) testing. Diabetes screening. This is done by checking your blood sugar (glucose) after you have not eaten for a while (fasting). You may have this done every 1-3 years. Bone density scan. This is done to screen for osteoporosis. You may have this done starting at age 60. Mammogram. This may be done every 1-2 years. Talk to your health care provider about how often you should have regular mammograms. Talk with your health care provider about your test results, treatment options, and if necessary, the need for more tests. Vaccines  Your health care provider may recommend certain vaccines, such as: Influenza vaccine. This is recommended every year. Tetanus, diphtheria, and acellular pertussis (Tdap, Td) vaccine. You may need a Td booster every 10 years. Zoster vaccine. You may need this after age 91. Pneumococcal 13-valent conjugate (PCV13) vaccine. One dose is recommended after age 25. Pneumococcal polysaccharide (PPSV23) vaccine. One dose is recommended after age 34. Talk to your health care provider about which screenings and vaccines you need and how often you need them. This information is not intended to replace advice given to you by  your health care provider. Make sure you discuss any questions you have with your health care provider. Document Released: 11/08/2015 Document Revised: 07/01/2016 Document Reviewed: 08/13/2015 Elsevier Interactive Patient Education  2017 [employer].  Fall Prevention in the Home Falls can cause injuries. They can happen to people of all ages. There are many things you can do to make your home safe and to help prevent falls. What can I do on the outside of my home? Regularly fix the edges of walkways and driveways and fix any cracks. Remove anything that might make you trip as you walk through a door, such as a raised step or threshold. Trim any bushes or trees on the path to your home. Use bright outdoor lighting. Clear any walking paths of anything that might make someone trip, such as rocks or tools. Regularly check to see if handrails are loose or broken. Make sure that both sides of any steps have handrails. Any raised decks and porches should have guardrails on the edges. Have any leaves, snow, or ice cleared regularly. Use sand or salt on walking paths during winter. Clean up  any spills in your garage right away. This includes oil or grease spills. What can I do in the bathroom? Use night lights. Install grab bars by the toilet and in the tub and shower. Do not use towel bars as grab bars. Use non-skid mats or decals in the tub or shower. If you need to sit down in the shower, use a plastic, non-slip stool. Keep the floor dry. Clean up any water that spills on the floor as soon as it happens. Remove soap buildup in the tub or shower regularly. Attach bath mats securely with double-sided non-slip rug tape. Do not have throw rugs and other things on the floor that can make you trip. What can I do in the bedroom? Use night lights. Make sure that you have a light by your bed that is easy to reach. Do not use any sheets or blankets that are too big for your bed. They should not hang  down onto the floor. Have a firm chair that has side arms. You can use this for support while you get dressed. Do not have throw rugs and other things on the floor that can make you trip. What can I do in the kitchen? Clean up any spills right away. Avoid walking on wet floors. Keep items that you use a lot in easy-to-reach places. If you need to reach something above you, use a strong step stool that has a grab bar. Keep electrical cords out of the way. Do not use floor polish or wax that makes floors slippery. If you must use wax, use non-skid floor wax. Do not have throw rugs and other things on the floor that can make you trip. What can I do with my stairs? Do not leave any items on the stairs. Make sure that there are handrails on both sides of the stairs and use them. Fix handrails that are broken or loose. Make sure that handrails are as long as the stairways. Check any carpeting to make sure that it is firmly attached to the stairs. Fix any carpet that is loose or worn. Avoid having throw rugs at the top or bottom of the stairs. If you do have throw rugs, attach them to the floor with carpet tape. Make sure that you have a light switch at the top of the stairs and the bottom of the stairs. If you do not have them, ask someone to add them for you. What else can I do to help prevent falls? Wear shoes that: Do not have high heels. Have rubber bottoms. Are comfortable and fit you well. Are closed at the toe. Do not wear sandals. If you use a stepladder: Make sure that it is fully opened. Do not climb a closed stepladder. Make sure that both sides of the stepladder are locked into place. Ask someone to hold it for you, if possible. Clearly mark and make sure that you can see: Any grab bars or handrails. First and last steps. Where the edge of each step is. Use tools that help you move around (mobility aids) if they are needed. These  include: Canes. Walkers. Scooters. Crutches. Turn on the lights when you go into a dark area. Replace any light bulbs as soon as they burn out. Set up your furniture so you have a clear path. Avoid moving your furniture around. If any of your floors are uneven, fix them. If there are any pets around you, be aware of where they are. Review your medicines with your  doctor. Some medicines can make you feel dizzy. This can increase your chance of falling. Ask your doctor what other things that you can do to help prevent falls. This information is not intended to replace advice given to you by your health care provider. Make sure you discuss any questions you have with your health care provider. Document Released: 08/08/2009 Document Revised: 03/19/2016 Document Reviewed: 11/16/2014 Elsevier Interactive Patient Education  2017 [employer].

## 2023-05-10 NOTE — Progress Notes (Signed)
Subjective:   Michelle Vasquez is a 72 y.o. female who presents for Medicare Annual (Subsequent) preventive examination.  Visit Complete: Virtual  I connected with  [name] on 05/10/23 by a audio enabled telemedicine application and verified that I am speaking with the correct person using two identifiers.  Patient Location: Home  Provider Location: Home Office  I discussed the limitations of evaluation and management by telemedicine. The patient expressed understanding and agreed to proceed.   Review of Systems      Cardiac Risk Factors include: advanced age (>48men, >31 women);hypertension      Objective:    Today's Vitals   05/10/23 1111  BP: 132/74  Weight: 106 lb (48.1 kg)  Height: 4\' 11"  (1.499 m)   Body mass index is 21.41 kg/m.     05/10/2023   11:16 AM 12/11/2022    4:19 PM 01/25/2022   11:49 AM 01/25/2022   11:48 AM 11/22/2020   10:37 AM 11/21/2019   12:37 PM 11/20/2019   11:16 AM  Advanced Directives  Does Patient Have a Medical Advance Directive? Yes Yes No No;Yes Yes No No  Type of Estate agent of [place];Living will Healthcare Power of [place]  Living will Healthcare Power of [place];Living will    Does patient want to make changes to medical advance directive?  No - Patient declined  No - Patient declined     Copy of Healthcare Power of Attorney in Chart? No - copy requested No - copy requested   No - copy requested    Would patient like information on creating a medical advance directive?    No - Patient declined   No - Patient declined    Current Medications (verified) Outpatient Encounter Medications as of 05/10/2023  Medication Sig   amLODipine (NORVASC) 5 MG tablet Take 1 tablet (5 mg total) by mouth daily.   Cholecalciferol (VITAMIN D) 50 MCG (2000 UT) CAPS Take 1 capsule (2,000 Units total) by mouth daily.   cyanocobalamin (VITAMIN B12) 1000 MCG/ML injection Inject 1 mL (1,000 mcg total) into the skin every 30  (thirty) days.   ferrous sulfate (SLOW RELEASE IRON) 160 (50 Fe) MG TBCR SR tablet Take 1 tablet (160 mg total) by mouth 2 (two) times a week.   folic acid (FOLVITE) 800 MCG tablet Take 1 tablet (800 mcg total) by mouth daily.   magnesium chloride (SLOWMAG MG MUSCLE/HEART) 64 MG TBEC SR tablet Take 2 tablets (128 mg total) by mouth 2 (two) times daily.   pantoprazole (PROTONIX) 40 MG tablet Take 40 mg by mouth daily.   predniSONE (DELTASONE) 5 MG tablet Take 1 tablet (5 mg total) by mouth daily with breakfast.   propranolol (INDERAL) 20 MG tablet Take 1 tablet (20 mg total) by mouth 2 (two) times daily.   SYRINGE/NEEDLE, DISP, 1 ML 23G X 1" 1 ML MISC Use as directed   No facility-administered encounter medications on file as of 05/10/2023.    Allergies (verified) Celexa [citalopram hydrobromide]   History: Past Medical History:  Diagnosis Date   Allergy    seasonal   Anxiety and depression 07/03/2011   B12 deficiency    Blood transfusion without reported diagnosis    Central hearing loss    COVID-19 virus infection 03/03/2021   Crohn disease (HCC)    s/p colectomy on , ?bacterial overgrowth   Fatty liver 12/15/2011   History of small bowel obstruction    HTN (hypertension)    Internal hemorrhoids  without mention of complication    Osteomalacia, unspecified    Perennial allergic rhinitis with seasonal variation    Squamous cell cancer of skin of left hand 11/2022   Dr Juanita Craver   Unspecified deficiency anemia    Unspecified polyarthropathy or polyarthritis, multiple sites    Past Surgical History:  Procedure Laterality Date   APPENDECTOMY     COLONOSCOPY  02/25/05   Internal hemms; crohns; narrow anast   COLONOSCOPY  02/26/10   Prior right hemicolectomy o/w benign (Dr. Juanda Chance)   COLONOSCOPY  02/2015   benign biopsies, crohn's with stenotic stricture at anastomosis Juanda Chance)   COLONOSCOPY  12/2019   small adenomas, crohn's disease small intestine, diverticulosis, non-patent end  to side ileocolonic anastomosis with inflammation and ulceration biopsied, rpt 3 yrs (Brahmbhatt)   DEXA  07/06/2011   normal T score -1.0 femur, spine   ESOPHAGOGASTRODUODENOSCOPY  02/25/05   Dilated stricture   ESOPHAGOGASTRODUODENOSCOPY  10/2017   esophageal stenosis dilated, duodenal diverticuluc (Nandigam)   HEMICOLECTOMY  1980   all small intestine, none large   OTHER SURGICAL HISTORY  1960s, 1978, 1980   ileal resection x 3 and subtotal colon resection   TOTAL ABDOMINAL HYSTERECTOMY  1975   ovaries out as well   UPPER GASTROINTESTINAL ENDOSCOPY     Family History  Problem Relation Age of Onset   Crohn's disease Father        colostomy   Hypertension Mother    Heart disease Mother 75   Alcohol abuse Brother    Cirrhosis Brother    Crohn's disease Sister    Crohn's disease Paternal Aunt        x 3   Crohn's disease Other        neice x 2   Colon cancer Neg Hx    Cancer Neg Hx    Diabetes Neg Hx    Rectal cancer Neg Hx    Stomach cancer Neg Hx    Esophageal cancer Neg Hx    Breast cancer Neg Hx    Social History   Socioeconomic History   Marital status: Divorced    Spouse name: Not on file   Number of children: 3   Years of education: Not on file   Highest education level: 12th grade  Occupational History   Occupation: Medical Transcription    Employer: OTHER    Comment: [employer]  Tobacco Use   Smoking status: Former    Current packs/day: 0.00    Average packs/day: 0.3 packs/day for 15.0 years (3.8 ttl pk-yrs)    Types: Cigarettes    Start date: 10/27/1963    Quit date: 10/26/1978    Years since quitting: 44.5   Smokeless tobacco: Never  Vaping Use   Vaping status: Never Used  Substance and Sexual Activity   Alcohol use: No   Drug use: No   Sexual activity: Not on file  Other Topics Concern   Not on file  Social History Narrative   Caffeine: 2 cups coffee/day   Divorced 1989   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   Activity: walks some with  dog, no regular activity   Diet: good water, fruits/vegetables occasionally   Social Determinants of Health   Financial Resource Strain: High Risk (05/10/2023)   Overall Financial Resource Strain (CARDIA)    Difficulty of Paying Living Expenses: Very hard  Food Insecurity: Food Insecurity Present (05/10/2023)   Hunger Vital Sign    Worried About Programme researcher, broadcasting/film/video in  the Last Year: Often true    Ran Out of Food in the Last Year: Often true  Transportation Needs: No Transportation Needs (05/10/2023)   PRAPARE - Administrator, Civil Service (Medical): No    Lack of Transportation (Non-Medical): No  Physical Activity: Sufficiently Active (05/10/2023)   Exercise Vital Sign    Days of Exercise per Week: 7 days    Minutes of Exercise per Session: 80 min  Recent Concern: Physical Activity - Insufficiently Active (04/19/2023)   Exercise Vital Sign    Days of Exercise per Week: 7 days    Minutes of Exercise per Session: 10 min  Stress: No Stress Concern Present (05/10/2023)   [employer] of Occupational Health - Occupational Stress Questionnaire    Feeling of Stress : Not at all  Recent Concern: Stress - Stress Concern Present (04/19/2023)   [employer] of Occupational Health - Occupational Stress Questionnaire    Feeling of Stress : Rather much  Social Connections: Moderately Isolated (05/10/2023)   Social Connection and Isolation Panel [NHANES]    Frequency of Communication with Friends and Family: Twice a week    Frequency of Social Gatherings with Friends and Family: Twice a week    Attends Religious Services: More than 4 times per year    Active Member of [employer] or Organizations: No    Attends Engineer, structural: Never    Marital Status: Divorced    Tobacco Counseling Counseling given: Not Answered   Clinical Intake:  Pre-visit preparation completed: Yes  Pain : No/denies pain     BMI - recorded: 21.41 Nutritional Status: BMI of 19-24   Normal Nutritional Risks: None Diabetes: No  How often do you need to have someone help you when you read instructions, pamphlets, or other written materials from your doctor or pharmacy?: 1 - Never  Interpreter Needed?: No  Information entered by :: C.Jullianna Gabor LPN   Activities of Daily Living    05/10/2023   11:24 AM 12/11/2022    4:21 PM  In your present state of health, do you have any difficulty performing the following activities:  Hearing? 1   Comment Has difficulty hearing   Vision? 0   Difficulty concentrating or making decisions? 1   Comment Sometimes   Walking or climbing stairs? 0   Dressing or bathing? 0   Doing errands, shopping? 0 1  Preparing Food and eating ? N   Using the Toilet? N   In the past six months, have you accidently leaked urine? N   Do you have problems with loss of bowel control? N   Managing your Medications? N   Managing your Finances? N   Housekeeping or managing your Housekeeping? N     Patient Care Team: Eustaquio Boyden, MD as PCP - General  Indicate any recent Medical Services you may have received from other than [facility] providers in the past year (date may be approximate).     Assessment:   This is a routine wellness examination for Laylynn.  Hearing/Vision screen Hearing Screening - Comments:: No aids. Has difficulty hearing Vision Screening - Comments:: Glasses - Looking for new provider- Pt will call to schedule appointment for eye exam.  Dietary issues and exercise activities discussed:     Goals Addressed             This Visit's Progress    Patient Stated       Stay active       Depression Screen  05/10/2023   11:21 AM 04/27/2023    9:02 AM 04/20/2023   11:21 AM 12/16/2022   11:36 AM 03/27/2022    4:23 PM 04/25/2021    3:08 PM 01/22/2021    8:27 AM  PHQ 2/9 Scores  PHQ - 2 Score 0 0 0 2 0 2 3  PHQ- 9 Score 0  4 16 0 8 9    Fall Risk    05/10/2023   11:24 AM 04/20/2023   11:21 AM 12/16/2022   11:36 AM  03/27/2022    3:30 PM 11/22/2020   10:40 AM  Fall Risk   Falls in the past year? 0 0 0 0 0  Number falls in past yr: 0    0  Injury with Fall? 0    0  Risk for fall due to : No Fall Risks    Medication side effect  Follow up Falls prevention discussed;Falls evaluation completed    Falls evaluation completed;Falls prevention discussed    MEDICARE RISK AT HOME:  Medicare Risk at Home - 05/10/23 1125     Any stairs in or around the home? Yes    If so, are there any without handrails? No    Home free of loose throw rugs in walkways, pet beds, electrical cords, etc? Yes    Adequate lighting in your home to reduce risk of falls? Yes    Life alert? No    Use of a cane, walker or w/c? No    Grab bars in the bathroom? Yes    Shower chair or bench in shower? Yes    Elevated toilet seat or a handicapped toilet? Yes             TIMED UP AND GO:  Was the test performed?  No    Cognitive Function:    11/22/2020   10:54 AM 11/20/2019   11:20 AM 11/11/2018   10:45 AM  MMSE - Mini Mental State Exam  Orientation to time 5 5 5   Orientation to Place 5 5 5   Registration 3 3 3   Attention/ Calculation 5 5 0  Recall 3 3 3   Language- name 2 objects   0  Language- repeat 1 1 1   Language- follow 3 step command   3  Language- read & follow direction   0  Write a sentence   0  Copy design   0  Total score   20        05/10/2023   11:26 AM  6CIT Screen  What Year? 0 points  What month? 0 points  What time? 0 points  Count back from 20 0 points  Months in reverse 0 points  Repeat phrase 0 points  Total Score 0 points    Immunizations Immunization History  Administered Date(s) Administered   Fluad Quad(high Dose 65+) 09/07/2019, 11/25/2020, 12/12/2022   Hepatitis B 04/23/2013, 05/01/2013   Hepatitis B, PED/ADOLESCENT 09/25/2013   Influenza Whole 07/26/2008   Influenza,inj,Quad PF,6+ Mos 08/13/2014, 09/01/2018   Influenza-Unspecified 08/04/2016, 07/30/2017   PFIZER(Purple  Top)SARS-COV-2 Vaccination 06/07/2020, 06/28/2020   Pneumococcal Conjugate-13 11/09/2017   Pneumococcal Polysaccharide-23 03/21/2013, 11/24/2019   Td 08/07/2004   Tdap 06/11/2014   Zoster, Live 08/24/2016    TDAP status: Up to date  Flu Vaccine status: Up to date  Pneumococcal vaccine status: Up to date  Covid-19 vaccine status: Information provided on how to obtain vaccines.   Qualifies for Shingles Vaccine? Yes   Zostavax completed  unknown   Shingrix  Completed?: No.    Education has been provided regarding the importance of this vaccine. Patient has been advised to call insurance company to determine out of pocket expense if they have not yet received this vaccine. Advised may also receive vaccine at local pharmacy or Health Dept. Verbalized acceptance and understanding.  Screening Tests Health Maintenance  Topic Date Due   Zoster Vaccines- Shingrix (1 of 2) 08/21/2000   COVID-19 Vaccine (3 - 2023-24 season) 06/26/2022   Colonoscopy  01/15/2023   Medicare Annual Wellness (AWV)  03/28/2023   INFLUENZA VACCINE  05/27/2023   MAMMOGRAM  06/10/2023   DTaP/Tdap/Td (3 - Td or Tdap) 06/11/2024   Pneumonia Vaccine 76+ Years old  Completed   DEXA SCAN  Completed   Hepatitis C Screening  Completed   HPV VACCINES  Aged Out    Health Maintenance  Health Maintenance Due  Topic Date Due   Zoster Vaccines- Shingrix (1 of 2) 08/21/2000   COVID-19 Vaccine (3 - 2023-24 season) 06/26/2022   Colonoscopy  01/15/2023   Medicare Annual Wellness (AWV)  03/28/2023    Colorectal cancer screening: Type of screening: Colonoscopy. Completed 01/15/20. Repeat every 3 years Pt has appointment with GI in August 2024.  Mammogram status: Ordered 05/10/23. Pt provided with contact info and advised to call to schedule appt.   Bone Density - Pt declined  Lung Cancer Screening: (Low Dose CT Chest recommended if Age 64-80 years, 20 pack-year currently smoking OR have quit w/in 15years.) does not  qualify.   Lung Cancer Screening Referral: n/a  Additional Screening:  Hepatitis C Screening: does qualify; Completed 07/26/15  Vision Screening: Recommended annual ophthalmology exams for early detection of glaucoma and other disorders of the eye. Is the patient up to date with their annual eye exam?   Pt will call to schedule appointment Who is the provider or what is the name of the office in which the patient attends annual eye exams? [facility] no longer accepts [employer], pt is currently looking for new provider. If pt is not established with a provider, would they like to be referred to a provider to establish care? Yes .   Dental Screening: Recommended annual dental exams for proper oral hygiene    Community Resource Referral / Chronic Care Management: CRR required this visit?  Yes   CCM required this visit?  No     Plan:     I have personally reviewed and noted the following in the patient's chart:   Medical and social history Use of alcohol, tobacco or illicit drugs  Current medications and supplements including opioid prescriptions. Patient is not currently taking opioid prescriptions. Functional ability and status Nutritional status Physical activity Advanced directives List of other physicians Hospitalizations, surgeries, and ER visits in previous 12 months Vitals Screenings to include cognitive, depression, and falls Referrals and appointments  In addition, I have reviewed and discussed with patient certain preventive protocols, quality metrics, and best practice recommendations. A written personalized care plan for preventive services as well as general preventive health recommendations were provided to patient.     Maryan Puls, LPN   6/96/2952   After Visit Summary: (MyChart) Due to this being a telephonic visit, the after visit summary with patients personalized plan was offered to patient via MyChart   Nurse Notes: CCR placed for  financial insecurities. Order for mammogram placed. Pt is past due for colonoscopy but  states that she is under GI care and has appointment scheduled in August.

## 2023-05-11 ENCOUNTER — Other Ambulatory Visit: Payer: Self-pay

## 2023-05-12 ENCOUNTER — Ambulatory Visit: Payer: Self-pay

## 2023-05-12 NOTE — Patient Instructions (Signed)
Visit Information  Thank you for taking time to visit with me today. Please don't hesitate to contact me if I can be of assistance to you.   Following are the goals we discussed today:   Goals Addressed             This Visit's Progress    Money management       Care Coordination Interventions: Patient is requesting budgeting.  She owes credit card bills.  By paying the credit card, it causes her to not have enough for her daily living expenses.  The gas has been disconnected and patient is worried she won't have enough for August bills.  Her bank account is already overdrawn for $150. Patient receives [phone] SSA and $43 Foodstamps.  Budget is reviewed and if patient does not pay the credit card bills, she will have sufficient funds for daily living expenses.    Patient reports food is also a need due to limited funds.  Patient has food but not a lot.  She has been provided 211 as a resource.  Agreed to contact a local food pantry.        Our next appointment is by telephone on 05/31/23 at 10:00am  Please call the care guide team at [phone] if you need to cancel or reschedule your appointment.   If you are experiencing a Mental Health or Behavioral Health Crisis or need someone to talk to, please call 911  Patient verbalizes understanding of instructions and care plan provided today and agrees to view in MyChart. Active MyChart status and patient understanding of how to access instructions and care plan via MyChart confirmed with patient.     Telephone follow up appointment with care management team member scheduled for: 05/31/23 at 10:00am.  Lysle Morales, BSW Social Worker [facility] Care Management  [phone]

## 2023-05-12 NOTE — Patient Outreach (Signed)
  Care Coordination   Initial Visit Note   05/12/2023 Name: RELLA EGELSTON MRN: [id number] DOB: 10/13/1951  [name] is a 72 y.o. year old female who sees Eustaquio Boyden, MD for primary care. I spoke with  [name] by phone today.  What matters to the patients health and wellness today?  Patient is struggling with finances and needs money management assistance.    Goals Addressed             This Visit's Progress    Money management       Care Coordination Interventions: Patient is requesting budgeting.  She owes credit card bills.  By paying the credit card, it causes her to not have enough for her daily living expenses.  The gas has been disconnected and patient is worried she won't have enough for August bills.  Her bank account is already overdrawn for $150. Patient receives [phone] SSA and $43 Foodstamps.  Budget is reviewed and if patient does not pay the credit card bills, she will have sufficient funds for daily living expenses.    Patient reports food is also a need due to limited funds.  Patient has food but not a lot.  She has been provided 211 as a resource.  Agreed to contact a local food pantry.        SDOH assessments and interventions completed:  Yes  SDOH Interventions Today    Flowsheet Row Most Recent Value  SDOH Interventions   Food Insecurity Interventions Other (Comment)  [Will refer to local food banks.  Provided 211 as a resource.  Receives foodstamps]  Housing Interventions Intervention Not Indicated  Utilities Interventions Other (Comment)  [Patient to start a payment plan on the old bill]        Care Coordination Interventions:  Yes, provided  Interventions Today    Flowsheet Row Most Recent Value  General Interventions   General Interventions Discussed/Reviewed General Interventions Discussed, Community Resources  [Food bank list will be mailed.  Budget counseling provided.  Suggested contacting [employer] for payment  arrangement.  Suggest requesting the City to review high water bill.]        Follow up plan: Follow up call scheduled for 05/31/23 at 10:00am    Encounter Outcome:  Pt. Visit Completed

## 2023-05-24 DIAGNOSIS — D3131 Benign neoplasm of right choroid: Secondary | ICD-10-CM | POA: Diagnosis not present

## 2023-05-24 DIAGNOSIS — Z0100 Encounter for examination of eyes and vision without abnormal findings: Secondary | ICD-10-CM | POA: Diagnosis not present

## 2023-05-24 DIAGNOSIS — H2513 Age-related nuclear cataract, bilateral: Secondary | ICD-10-CM | POA: Diagnosis not present

## 2023-05-24 DIAGNOSIS — H43813 Vitreous degeneration, bilateral: Secondary | ICD-10-CM | POA: Diagnosis not present

## 2023-05-26 ENCOUNTER — Other Ambulatory Visit (INDEPENDENT_AMBULATORY_CARE_PROVIDER_SITE_OTHER): Payer: HMO

## 2023-05-26 DIAGNOSIS — E559 Vitamin D deficiency, unspecified: Secondary | ICD-10-CM

## 2023-05-26 DIAGNOSIS — K50118 Crohn's disease of large intestine with other complication: Secondary | ICD-10-CM | POA: Diagnosis not present

## 2023-05-26 DIAGNOSIS — E538 Deficiency of other specified B group vitamins: Secondary | ICD-10-CM

## 2023-05-26 DIAGNOSIS — R7989 Other specified abnormal findings of blood chemistry: Secondary | ICD-10-CM

## 2023-05-26 LAB — MAGNESIUM: Magnesium: 1.5 mg/dL (ref 1.5–2.5)

## 2023-05-26 LAB — COMPREHENSIVE METABOLIC PANEL
ALT: 38 U/L — ABNORMAL HIGH (ref 0–35)
AST: 31 U/L (ref 0–37)
Albumin: 4.2 g/dL (ref 3.5–5.2)
Alkaline Phosphatase: 80 U/L (ref 39–117)
BUN: 17 mg/dL (ref 6–23)
CO2: 28 mEq/L (ref 19–32)
Calcium: 9.4 mg/dL (ref 8.4–10.5)
Chloride: 103 mEq/L (ref 96–112)
Creatinine, Ser: 0.85 mg/dL (ref 0.40–1.20)
GFR: 68.28 mL/min (ref >60.00)
Glucose, Bld: 73 mg/dL (ref 70–99)
Potassium: 4.2 mEq/L (ref 3.5–5.1)
Sodium: 140 mEq/L (ref 135–145)
Total Bilirubin: 0.4 mg/dL (ref 0.2–1.2)
Total Protein: 7.3 g/dL (ref 6.0–8.3)

## 2023-05-26 LAB — VITAMIN B12: Vitamin B-12: 215 pg/mL (ref 211–911)

## 2023-05-26 LAB — TSH: TSH: 15.33 u[IU]/mL — ABNORMAL HIGH (ref 0.35–5.50)

## 2023-05-26 LAB — T4, FREE: Free T4: 0.74 ng/dL (ref 0.60–1.60)

## 2023-05-26 LAB — VITAMIN D 25 HYDROXY (VIT D DEFICIENCY, FRACTURES): VITD: 23.34 ng/mL — ABNORMAL LOW (ref 30.00–100.00)

## 2023-05-31 ENCOUNTER — Ambulatory Visit: Payer: Self-pay

## 2023-05-31 NOTE — Patient Instructions (Signed)
Visit Information  Thank you for taking time to visit with me today. Please don't hesitate to contact me if I can be of assistance to you.   Following are the goals we discussed today:   Goals Addressed             This Visit's Progress    Money management       Care Coordination Interventions: Patient is requesting budgeting.  She owes credit card bills.  By paying the credit card, it causes her to not have enough for her daily living expenses.  The gas has been disconnected and patient is worried she won't have enough for August bills.  Her bank account is already overdrawn for $150. Patient receives [phone] SSA and $43 Foodstamps.  Budget is reviewed and if patient does not pay the credit card bills, she will have sufficient funds for daily living expenses.    Patient reports food is also a need due to limited funds.  Patient has food but not a lot.  She has been provided 211 as a resource.  Agreed to contact a local food pantry.  Care Coordination Interventions: Water-Patient spoke to the water company and there was a 5% increase along with a $20 late fee.  Once patient pays the bill on time she will reduce the bill by $20.   Gas-Patient will speak to [facility] on Wednesday regarding assistance toward her [employer] and will consider asking family for co-signer to avoid a deposit.  Patient will also contact DSS in November to inquire about the LIEAP program to contribute toward the gas bill. Cell phone-Patient reduced her cell phone from $57 to $36 to be effective in September. Food-Patient has an appointment on Wednesday for food assistance.  Patient did receive the food bank list but the locations are far away. Housing-Patient is hopeful that her name will be pulled soon for [employer].  Once she has reduced her rental payment, she can resume making credit card payments.  This will not cause her to be in crisis because she will have additional funds available. Patient  feels that by September, her budget should be stable.            Our next appointment is by telephone on 07/06/23 at 1pm  Please call the care guide team at [phone] if you need to cancel or reschedule your appointment.   If you are experiencing a Mental Health or Behavioral Health Crisis or need someone to talk to, please call 911  Patient verbalizes understanding of instructions and care plan provided today and agrees to view in MyChart. Active MyChart status and patient understanding of how to access instructions and care plan via MyChart confirmed with patient.     Telephone follow up appointment with care management team member scheduled for: 07/06/23 at 1pm.  Lysle Morales, BSW Social Worker [facility] Care Management  [phone]

## 2023-05-31 NOTE — Patient Outreach (Signed)
  Care Coordination   Follow Up Visit Note   05/31/2023 Name: Michelle Vasquez MRN: [id number] DOB: 10-23-1951  Michelle Vasquez is a 72 y.o. year old female who sees Eustaquio Boyden, MD for primary care. I spoke with  Michelle Vasquez by phone today.  What matters to the patients health and wellness today?  Budget Counseling.    Goals Addressed             This Visit's Progress    Money management       Care Coordination Interventions: Patient is requesting budgeting.  She owes credit card bills.  By paying the credit card, it causes her to not have enough for her daily living expenses.  The gas has been disconnected and patient is worried she won't have enough for August bills.  Her bank account is already overdrawn for $150. Patient receives [phone] SSA and $43 Foodstamps.  Budget is reviewed and if patient does not pay the credit card bills, she will have sufficient funds for daily living expenses.    Patient reports food is also a need due to limited funds.  Patient has food but not a lot.  She has been provided 211 as a resource.  Agreed to contact a local food pantry.  Care Coordination Interventions: Water-Patient spoke to the water company and there was a 5% increase along with a $20 late fee.  Once patient pays the bill on time she will reduce the bill by $20.   Gas-Patient will speak to [facility] on Wednesday regarding assistance toward her [employer] and will consider asking family for co-signer to avoid a deposit.  Patient will also contact DSS in November to inquire about the LIEAP program to contribute toward the gas bill. Cell phone-Patient reduced her cell phone from $57 to $36 to be effective in September. Food-Patient has an appointment on Wednesday for food assistance.  Patient did receive the food bank list but the locations are far away. Housing-Patient is hopeful that her name will be pulled soon for [employer].  Once she has reduced her rental  payment, she can resume making credit card payments.  This will not cause her to be in crisis because she will have additional funds available. Patient feels that by September, her budget should be stable.            SDOH assessments and interventions completed:  No     Care Coordination Interventions:  Yes, provided  Interventions Today    Flowsheet Row Most Recent Value  General Interventions   General Interventions Discussed/Reviewed General Interventions Reviewed, Community Resources  [employer] for food and gas bill, DSS LIEAP program gas bill,]        Follow up plan: Follow up call scheduled for 07/06/23 at 1pm.    Encounter Outcome:  Pt. Visit Completed

## 2023-06-02 ENCOUNTER — Encounter: Payer: Self-pay | Admitting: Family Medicine

## 2023-06-02 ENCOUNTER — Ambulatory Visit (INDEPENDENT_AMBULATORY_CARE_PROVIDER_SITE_OTHER): Payer: HMO | Admitting: Family Medicine

## 2023-06-02 VITALS — BP 150/86 | HR 65 | Temp 97.5°F | Ht 59.5 in | Wt 108.1 lb

## 2023-06-02 DIAGNOSIS — R251 Tremor, unspecified: Secondary | ICD-10-CM | POA: Diagnosis not present

## 2023-06-02 DIAGNOSIS — K50118 Crohn's disease of large intestine with other complication: Secondary | ICD-10-CM

## 2023-06-02 DIAGNOSIS — E559 Vitamin D deficiency, unspecified: Secondary | ICD-10-CM | POA: Diagnosis not present

## 2023-06-02 DIAGNOSIS — F3342 Major depressive disorder, recurrent, in full remission: Secondary | ICD-10-CM

## 2023-06-02 DIAGNOSIS — Z7952 Long term (current) use of systemic steroids: Secondary | ICD-10-CM | POA: Diagnosis not present

## 2023-06-02 DIAGNOSIS — Z7189 Other specified counseling: Secondary | ICD-10-CM | POA: Diagnosis not present

## 2023-06-02 DIAGNOSIS — E538 Deficiency of other specified B group vitamins: Secondary | ICD-10-CM | POA: Diagnosis not present

## 2023-06-02 DIAGNOSIS — I10 Essential (primary) hypertension: Secondary | ICD-10-CM | POA: Diagnosis not present

## 2023-06-02 DIAGNOSIS — M8588 Other specified disorders of bone density and structure, other site: Secondary | ICD-10-CM

## 2023-06-02 DIAGNOSIS — Z9049 Acquired absence of other specified parts of digestive tract: Secondary | ICD-10-CM | POA: Diagnosis not present

## 2023-06-02 DIAGNOSIS — Z0000 Encounter for general adult medical examination without abnormal findings: Secondary | ICD-10-CM

## 2023-06-02 DIAGNOSIS — E2740 Unspecified adrenocortical insufficiency: Secondary | ICD-10-CM

## 2023-06-02 DIAGNOSIS — R7989 Other specified abnormal findings of blood chemistry: Secondary | ICD-10-CM

## 2023-06-02 MED ORDER — CYANOCOBALAMIN 1000 MCG/ML IJ SOLN: 1000.0000 ug | INTRAMUSCULAR | 1 refills | Status: AC

## 2023-06-02 NOTE — Assessment & Plan Note (Signed)
Reviewed recent thyroid functions-TSH was elevated however T3 and free T4 were normal range. She denies significant hypothyroid symptoms-at this time we will not start thyroid replacement. She has upcoming endocrinology appointment next week and will be interested in Dr. Isidoro Donning input regarding thyroid function.

## 2023-06-02 NOTE — Assessment & Plan Note (Signed)
Chronic, above goal. Worse BP control since starting prednisone. She is planning to discuss possible change in oral steroid at upcoming endo appt. BP log provided to start monitoring at home. If persistently elevated, will let me know to consider adding ACEI. Consider ACEI/ARB if further antihypertensive needed, avoid diuretic and aldosterone antagonist in adrenal insufficiency.

## 2023-06-02 NOTE — Assessment & Plan Note (Signed)
Advanced directive - completed at home. Wants daughter to be HCPOA asked to bring [place] a copy.

## 2023-06-02 NOTE — Progress Notes (Signed)
Ph: [phone] Fax: [phone]   Patient ID: Michelle Vasquez, female    DOB: 06-19-1951, 72 y.o.   MRN: [id number]  This visit was conducted in person.  BP (!) 150/86   Pulse 65   Temp (!) 97.5 F (36.4 C) (Temporal)   Ht 4' 11.5" (1.511 m)   Wt 108 lb 2 oz (49 kg)   SpO2 98%   BMI 21.47 kg/m   BP Readings from Last 3 Encounters:  06/02/23 (!) 150/86  05/10/23 132/74  04/27/23 (!) 162/86  160/78 on repeat   Pulse Readings from Last 3 Encounters:  06/02/23 65  04/27/23 80  04/20/23 81   CC: CPE Subjective:   HPI: Michelle Vasquez is a 72 y.o. female presenting on 06/02/2023 for Annual Exam (MCR prt 2 [AWV- 05/10/23].)   Saw health advisor last month for medicare wellness visit. Note reviewed.    No results found.  Flowsheet Row Clinical Support from 05/10/2023 in [facility] [place]  PHQ-2 Total Score 0          05/12/2023   10:46 AM 05/10/2023   11:24 AM 04/20/2023   11:21 AM 12/16/2022   11:36 AM 03/27/2022    3:30 PM  Fall Risk   Falls in the past year? 0 0 0 0 0  Number falls in past yr:  0     Injury with Fall?  0     Risk for fall due to :  No Fall Risks     Follow up  Falls prevention discussed;Falls evaluation completed      Crohn's - sees [facility] Dr Allegra Lai, off mercaptopurine for several months.  Continues SlowMag/calcium 71.5/119mg  2 tab BID.   Recent diagnosis of secondary adrenal insufficiency, now on prednisone 5mg  daily followed by endocrinology. Notes trouble sleeping. BP also elevated since starting this. Hypertensive urgency earlier in the summer - now on amlodipine 5mg  daily and propranolol 20mg  BID.  No HA, vision changes, CP/tightness, SOB, leg swelling.   TSH elevated -  + fatigue No constipation No skin or hair changes No cold intolerance No unexpected weight changes   Preventative: COLONOSCOPY Date: 02/2015 benign biopsies, crohn's with stenotic stricture at anastomosis Juanda Chance). COLONOSCOPY  12/2019 - small adenomas, crohn's disease small intestine, diverticulosis, non-patent end to side ileocolonic anastomosis with inflammation and ulceration biopsied (no dysplasia) ([place]), pending repeat 3 yrs.  Well woman - s/p total hysterectomy for fibroids, ovaries removed at age 46yo. Took hormone replacement only for a few months, stopped early but unclear reason.  Mammogram 05/2022 - Birads1 @ [place]. She does breast exams at home. No fmhx breast cancer.  DEXA - spine -1.7, hip -1.6 02/2018. Discussed calcium in diet (limited due to crohn's), calcium supplement, and vitamin D supplement, and regular weight bearing exercise.  Lung cancer screening - not eligible  Flu shot yearly COVID vaccine Pfizer 05/2020, 06/2020, no booster  Tdap 2005, 2015 Pneumovax 2014. Prevnar-13 2019. Pneumovax 10/2019 zostavax - 2017 Shingrix - discussed.  RSV - discussed Completed Hep B series.  Advanced directive - completed at home. Wants daughter to be HCPOA, asked to bring [place] a copy.  Seat belt use discussed.  Sunscreen use discussed. No suspicious moles. Sees derm regularly.  Ex smoker quit remotely - but daughter smokes outdoors Alcohol - none  Dentist q6 mo  Eye exam yearly  Bowel - h/o crohn's with loose stools, no constipation  Bladder - no incontinence    Caffeine: 2  cups coffee/day   Divorced 1989   3 children   Occ: Museum/gallery exhibitions officer, working at [employer] in [place]  Activity: walks dog 1 mi 4-5 times a week.  Diet: good water, fruits/vegetables ok      Relevant past medical, surgical, family and social history reviewed and updated as indicated. Interim medical history since our last visit reviewed. Allergies and medications reviewed and updated. Outpatient Medications Prior to Visit  Medication Sig Dispense Refill   amLODipine (NORVASC) 5 MG tablet Take 1 tablet (5 mg total) by mouth daily. 90 tablet 3   Cholecalciferol (VITAMIN D) 50 MCG (2000 UT) CAPS Take 1 capsule  (2,000 Units total) by mouth daily. 90 capsule 3   ferrous sulfate (SLOW RELEASE IRON) 160 (50 Fe) MG TBCR SR tablet Take 1 tablet (160 mg total) by mouth 2 (two) times a week. 30 tablet 3   folic acid (FOLVITE) 800 MCG tablet Take 1 tablet (800 mcg total) by mouth daily.     pantoprazole (PROTONIX) 40 MG tablet Take 40 mg by mouth daily.     predniSONE (DELTASONE) 5 MG tablet Take 1 tablet (5 mg total) by mouth daily with breakfast. 90 tablet 1   propranolol (INDERAL) 20 MG tablet Take 1 tablet (20 mg total) by mouth 2 (two) times daily. 60 tablet 3   SYRINGE/NEEDLE, DISP, 1 ML 23G X 1" 1 ML MISC Use as directed 8 each 0   cyanocobalamin (VITAMIN B12) 1000 MCG/ML injection Inject 1 mL (1,000 mcg total) into the skin every 30 (thirty) days. 1 mL 11   No facility-administered medications prior to visit.     Per HPI unless specifically indicated in ROS section below Review of Systems  Constitutional:  Positive for chills (occ). Negative for activity change, appetite change, fatigue, fever and unexpected weight change.  HENT:  Negative for hearing loss.   Eyes:  Negative for visual disturbance.  Respiratory:  Negative for cough, chest tightness, shortness of breath and wheezing.   Cardiovascular:  Negative for chest pain, palpitations and leg swelling.  Gastrointestinal:  Positive for constipation and diarrhea. Negative for abdominal distention, abdominal pain, blood in stool, nausea and vomiting.  Endocrine: Positive for heat intolerance (on prednisone). Negative for cold intolerance.  Genitourinary:  Negative for difficulty urinating and hematuria.  Musculoskeletal:  Negative for arthralgias, myalgias and neck pain.  Skin:  Negative for rash.  Neurological:  Negative for dizziness, seizures, syncope and headaches.  Hematological:  Negative for adenopathy. Does not bruise/bleed easily.  Psychiatric/Behavioral:  Negative for dysphoric mood. The patient is not nervous/anxious.     Objective:   BP (!) 150/86   Pulse 65   Temp (!) 97.5 F (36.4 C) (Temporal)   Ht 4' 11.5" (1.511 m)   Wt 108 lb 2 oz (49 kg)   SpO2 98%   BMI 21.47 kg/m   Wt Readings from Last 3 Encounters:  06/02/23 108 lb 2 oz (49 kg)  05/10/23 106 lb (48.1 kg)  04/27/23 104 lb 2 oz (47.2 kg)      Physical Exam Vitals and nursing note reviewed.  Constitutional:      Appearance: Normal appearance. She is not ill-appearing.  HENT:     Head: Normocephalic and atraumatic.     Right Ear: Tympanic membrane, ear canal and external ear normal. There is no impacted cerumen.     Left Ear: Tympanic membrane, ear canal and external ear normal. There is no impacted cerumen.     Mouth/Throat:  Mouth: Mucous membranes are moist.     Pharynx: Oropharynx is clear. No oropharyngeal exudate or posterior oropharyngeal erythema.  Eyes:     General:        Right eye: No discharge.        Left eye: No discharge.     Extraocular Movements: Extraocular movements intact.     Conjunctiva/sclera: Conjunctivae normal.     Pupils: Pupils are equal, round, and reactive to light.  Neck:     Thyroid: No thyroid mass or thyromegaly.     Vascular: No carotid bruit.  Cardiovascular:     Rate and Rhythm: Normal rate and regular rhythm.     Pulses: Normal pulses.     Heart sounds: Normal heart sounds. No murmur heard. Pulmonary:     Effort: Pulmonary effort is normal. No respiratory distress.     Breath sounds: Normal breath sounds. No wheezing, rhonchi or rales.  Abdominal:     General: Bowel sounds are normal. There is no distension.     Palpations: Abdomen is soft. There is no mass.     Tenderness: There is no abdominal tenderness. There is no guarding or rebound.     Hernia: No hernia is present.  Musculoskeletal:     Cervical back: Normal range of motion and neck supple. No rigidity.     Right lower leg: No edema.     Left lower leg: No edema.  Lymphadenopathy:     Cervical: No cervical adenopathy.  Skin:     General: Skin is warm and dry.     Findings: No rash.  Neurological:     General: No focal deficit present.     Mental Status: She is alert. Mental status is at baseline.  Psychiatric:        Mood and Affect: Mood normal.        Behavior: Behavior normal.       Results for orders placed or performed in visit on 05/26/23  Magnesium  Result Value Ref Range   Magnesium 1.5 1.5 - 2.5 mg/dL  VITAMIN D 25 Hydroxy (Vit-D Deficiency, Fractures)  Result Value Ref Range   VITD 23.34 (L) 30.00 - 100.00 ng/mL  Vitamin B12  Result Value Ref Range   Vitamin B-12 215 211 - 911 pg/mL  T3  Result Value Ref Range   T3, Total 88 76 - 181 ng/dL  T4, free  Result Value Ref Range   Free T4 0.74 0.60 - 1.60 ng/dL  TSH  Result Value Ref Range   TSH 15.33 (H) 0.35 - 5.50 uIU/mL  Comprehensive metabolic panel  Result Value Ref Range   Sodium 140 135 - 145 mEq/L   Potassium 4.2 3.5 - 5.1 mEq/L   Chloride 103 96 - 112 mEq/L   CO2 28 19 - 32 mEq/L   Glucose, Bld 73 70 - 99 mg/dL   BUN 17 6 - 23 mg/dL   Creatinine, Ser 3.24 0.40 - 1.20 mg/dL   Total Bilirubin 0.4 0.2 - 1.2 mg/dL   Alkaline Phosphatase 80 39 - 117 U/L   AST 31 0 - 37 U/L   ALT 38 (H) 0 - 35 U/L   Total Protein 7.3 6.0 - 8.3 g/dL   Albumin 4.2 3.5 - 5.2 g/dL   GFR 40.10 >27.25 mL/min   Calcium 9.4 8.4 - 10.5 mg/dL   Lab Results  Component Value Date   WBC 11.2 (H) 04/20/2023   HGB 13.3 04/20/2023   HCT 41.1 04/20/2023  MCV 98.6 04/20/2023   PLT 275.0 04/20/2023     Assessment & Plan:   Problem List Items Addressed This Visit     Health maintenance examination - Primary (Chronic)    Preventative protocols reviewed and updated unless pt declined. Discussed healthy diet and lifestyle.       Advanced directives, counseling/discussion (Chronic)    Advanced directive - completed at home. Wants daughter to be HCPOA asked to bring [place] a copy.       Vitamin B12 deficiency    She's been off B12 replacement for several  months now - due to cost of medication. Will send in 10mL vial in case any cheaper.   If unaffordable, she will start oral b12 replacement daily.       Essential hypertension    Chronic, above goal. Worse BP control since starting prednisone. She is planning to discuss possible change in oral steroid at upcoming endo appt. BP log provided to start monitoring at home. If persistently elevated, will let me know to consider adding ACEI. Consider ACEI/ARB if further antihypertensive needed, avoid diuretic and aldosterone antagonist in adrenal insufficiency.       Crohn's disease (HCC)    Chronic, stable period, some better since on prednisone. Appreciate GI care.       S/P partial colectomy   MDD (major depressive disorder), recurrent, in full remission (HCC)    Stable off of medication.      Tremor    Improvement on propranolol 20 mg twice daily-continue this. Presumed essential tremor present since at least 2019      Osteopenia    Will update DEXA for osteopenia f/u, now on chronic prednisone use.      Relevant Orders   DG Bone Density   Vitamin D deficiency    She has been off of vitamin D replacement for several weeks-will restart this.      Hypomagnesemia    Levels normalized on daily replacement-continue this.      Adrenal insufficiency (HCC)    Appreciate Endo care, she continues prednisone 5 mg daily with benefit. Planning to discuss ongoing oral steroid use at upcoming endocrinology appointment.      Abnormal TSH    Reviewed recent thyroid functions-TSH was elevated however T3 and free T4 were normal range. She denies significant hypothyroid symptoms-at this time we will not start thyroid replacement. She has upcoming endocrinology appointment next week and will be interested in Dr. Isidoro Donning input regarding thyroid function.      Other Visit Diagnoses     Current use of steroid medication       Relevant Orders   DG Bone Density        Meds  ordered this encounter  Medications   cyanocobalamin (VITAMIN B12) 1000 MCG/ML injection    Sig: Inject 1 mL (1,000 mcg total) into the skin every 30 (thirty) days.    Dispense:  10 mL    Refill:  1    Orders Placed This Encounter  Procedures   DG Bone Density    Standing Status:   Future    Standing Expiration Date:   06/01/2024    Order Specific Question:   Reason for Exam (SYMPTOM  OR DIAGNOSIS REQUIRED)    Answer:   osteopenia    Order Specific Question:   Preferred imaging location?    Answer:   [facility]    Patient Instructions  Schedule bone density scan when you go get your mammogram.  If interested,  check with pharmacy about new 2 shot shingles series (shingrix), and RSV vaccine.  Continue vitamin D3 2000 units daily 10cc B12 vial sent to pharmacy to price out. If unable to get, start b12 over the counter daily.  Thyroid stimulating hormone was elevated but thyroid hormones were normal - I'd like to get endocrinology input on this prior to starting thyroid replacement.  Return in 6 months for blood pressure follow up visit.   Follow up plan: Return in about 6 months (around 12/03/2023) for follow up visit.  Eustaquio Boyden, MD

## 2023-06-02 NOTE — Assessment & Plan Note (Signed)
Improvement on propranolol 20 mg twice daily-continue this. Presumed essential tremor present since at least 2019

## 2023-06-02 NOTE — Assessment & Plan Note (Signed)
Levels normalized on daily replacement-continue this.

## 2023-06-02 NOTE — Assessment & Plan Note (Signed)
She has been off of vitamin D replacement for several weeks-will restart this.

## 2023-06-02 NOTE — Assessment & Plan Note (Signed)
Chronic, stable period, some better since on prednisone. Appreciate GI care.

## 2023-06-02 NOTE — Assessment & Plan Note (Signed)
Appreciate Endo care, she continues prednisone 5 mg daily with benefit. Planning to discuss ongoing oral steroid use at upcoming endocrinology appointment.

## 2023-06-02 NOTE — Assessment & Plan Note (Signed)
She's been off B12 replacement for several months now - due to cost of medication. Will send in 10mL vial in case any cheaper.   If unaffordable, she will start oral b12 replacement daily.

## 2023-06-02 NOTE — Assessment & Plan Note (Signed)
Will update DEXA for osteopenia f/u, now on chronic prednisone use.

## 2023-06-02 NOTE — Assessment & Plan Note (Signed)
Stable off of medication

## 2023-06-02 NOTE — Patient Instructions (Addendum)
Schedule bone density scan when you go get your mammogram.  If interested, check with pharmacy about new 2 shot shingles series (shingrix), and RSV vaccine.  Continue vitamin D3 2000 units daily 10cc B12 vial sent to pharmacy to price out. If unable to get, start b12 over the counter daily.  Thyroid stimulating hormone was elevated but thyroid hormones were normal - I'd like to get endocrinology input on this prior to starting thyroid replacement.  Return in 6 months for blood pressure follow up visit.

## 2023-06-02 NOTE — Assessment & Plan Note (Signed)
Preventative protocols reviewed and updated unless pt declined. Discussed healthy diet and lifestyle.  

## 2023-06-07 DIAGNOSIS — E2740 Unspecified adrenocortical insufficiency: Secondary | ICD-10-CM | POA: Diagnosis not present

## 2023-06-11 ENCOUNTER — Encounter: Payer: Self-pay | Admitting: "Endocrinology

## 2023-06-11 ENCOUNTER — Ambulatory Visit (INDEPENDENT_AMBULATORY_CARE_PROVIDER_SITE_OTHER): Payer: HMO | Admitting: "Endocrinology

## 2023-06-11 VITALS — BP 138/78 | HR 60 | Ht 59.5 in | Wt 108.4 lb

## 2023-06-11 DIAGNOSIS — E2740 Unspecified adrenocortical insufficiency: Secondary | ICD-10-CM

## 2023-06-11 NOTE — Progress Notes (Signed)
06/11/2023, 10:25 AM   Endocrinology follow-up note  Subjective:    Patient ID: Michelle Vasquez, female    DOB: 04-Aug-1951, PCP Eustaquio Boyden, MD   Past Medical History:  Diagnosis Date   Allergy    seasonal   Anxiety and depression 07/03/2011   B12 deficiency    Blood transfusion without reported diagnosis    Central hearing loss    COVID-19 virus infection 03/03/2021   Crohn disease (HCC)    s/p colectomy on , ?bacterial overgrowth   Facial cellulitis 01/15/2023   Fatty liver 12/15/2011   History of small bowel obstruction    HTN (hypertension)    Internal hemorrhoids without mention of complication    Osteomalacia, unspecified    Perennial allergic rhinitis with seasonal variation    Squamous cell cancer of skin of left hand 11/2022   Dr Juanita Craver   Unspecified deficiency anemia    Unspecified polyarthropathy or polyarthritis, multiple sites    Past Surgical History:  Procedure Laterality Date   APPENDECTOMY     COLONOSCOPY  02/25/05   Internal hemms; crohns; narrow anast   COLONOSCOPY  02/26/10   Prior right hemicolectomy o/w benign (Dr. Juanda Chance)   COLONOSCOPY  02/2015   benign biopsies, crohn's with stenotic stricture at anastomosis Juanda Chance)   COLONOSCOPY  12/2019   small adenomas, crohn's disease small intestine, diverticulosis, non-patent end to side ileocolonic anastomosis with inflammation and ulceration biopsied, rpt 3 yrs (Brahmbhatt)   DEXA  07/06/2011   normal T score -1.0 femur, spine   ESOPHAGOGASTRODUODENOSCOPY  02/25/05   Dilated stricture   ESOPHAGOGASTRODUODENOSCOPY  10/2017   esophageal stenosis dilated, duodenal diverticuluc (Nandigam)   HEMICOLECTOMY  1980   all small intestine, none large   OTHER SURGICAL HISTORY  1960s, 1978, 1980   ileal resection x 3 and subtotal colon resection   TOTAL ABDOMINAL HYSTERECTOMY  1975   ovaries out as well   UPPER GASTROINTESTINAL ENDOSCOPY     Social History    Socioeconomic History   Marital status: Divorced    Spouse name: Not on file   Number of children: 3   Years of education: Not on file   Highest education level: 12th grade  Occupational History   Occupation: Medical Transcription    Employer: OTHER    Comment: [employer]  Tobacco Use   Smoking status: Former    Current packs/day: 0.00    Average packs/day: 0.3 packs/day for 15.0 years (3.8 ttl pk-yrs)    Types: Cigarettes    Start date: 10/27/1963    Quit date: 10/26/1978    Years since quitting: 44.6   Smokeless tobacco: Never  Vaping Use   Vaping status: Never Used  Substance and Sexual Activity   Alcohol use: No   Drug use: No   Sexual activity: Not on file  Other Topics Concern   Not on file  Social History Narrative   Caffeine: 2 cups coffee/day   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, no regular activity   Diet: good water, fruits/vegetables occasionally   Social Determinants of Health   Financial Resource Strain: High Risk (05/10/2023)   Overall Financial Resource Strain (CARDIA)  Difficulty of Paying Living Expenses: Very hard  Food Insecurity: Food Insecurity Present (05/12/2023)   Hunger Vital Sign    Worried About Running Out of Food in the Last Year: Sometimes true    Ran Out of Food in the Last Year: Sometimes true  Transportation Needs: No Transportation Needs (05/12/2023)   PRAPARE - Administrator, Civil Service (Medical): No    Lack of Transportation (Non-Medical): No  Physical Activity: Sufficiently Active (05/10/2023)   Exercise Vital Sign    Days of Exercise per Week: 7 days    Minutes of Exercise per Session: 80 min  Recent Concern: Physical Activity - Insufficiently Active (04/19/2023)   Exercise Vital Sign    Days of Exercise per Week: 7 days    Minutes of Exercise per Session: 10 min  Stress: No Stress Concern Present (05/10/2023)   [employer] of Occupational Health -  Occupational Stress Questionnaire    Feeling of Stress : Not at all  Recent Concern: Stress - Stress Concern Present (04/19/2023)   [employer] of Occupational Health - Occupational Stress Questionnaire    Feeling of Stress : Rather much  Social Connections: Moderately Isolated (05/10/2023)   Social Connection and Isolation Panel [NHANES]    Frequency of Communication with Friends and Family: Twice a week    Frequency of Social Gatherings with Friends and Family: Twice a week    Attends Religious Services: More than 4 times per year    Active Member of [employer] or Organizations: No    Attends Engineer, structural: Never    Marital Status: Divorced   Family History  Problem Relation Age of Onset   Crohn's disease Father        colostomy   Hypertension Mother    Heart disease Mother 74   Alcohol abuse Brother    Cirrhosis Brother    Crohn's disease Sister    Crohn's disease Paternal Aunt        x 3   Crohn's disease Other        neice x 2   Colon cancer Neg Hx    Cancer Neg Hx    Diabetes Neg Hx    Rectal cancer Neg Hx    Stomach cancer Neg Hx    Esophageal cancer Neg Hx    Breast cancer Neg Hx    Outpatient Encounter Medications as of 06/11/2023  Medication Sig   amLODipine (NORVASC) 5 MG tablet Take 1 tablet (5 mg total) by mouth daily.   Cholecalciferol (VITAMIN D) 50 MCG (2000 UT) CAPS Take 1 capsule (2,000 Units total) by mouth daily.   cyanocobalamin (VITAMIN B12) 1000 MCG/ML injection Inject 1 mL (1,000 mcg total) into the skin every 30 (thirty) days.   ferrous sulfate (SLOW RELEASE IRON) 160 (50 Fe) MG TBCR SR tablet Take 1 tablet (160 mg total) by mouth 2 (two) times a week.   folic acid (FOLVITE) 800 MCG tablet Take 1 tablet (800 mcg total) by mouth daily.   pantoprazole (PROTONIX) 40 MG tablet Take 40 mg by mouth daily.   predniSONE (DELTASONE) 5 MG tablet Take 1 tablet (5 mg total) by mouth daily with breakfast.   propranolol (INDERAL) 20 MG tablet Take  1 tablet (20 mg total) by mouth 2 (two) times daily.   SYRINGE/NEEDLE, DISP, 1 ML 23G X 1" 1 ML MISC Use as directed   No facility-administered encounter medications on file as of 06/11/2023.   ALLERGIES: Allergies  Allergen Reactions  Celexa [Citalopram Hydrobromide] Other (See Comments)    Racing thoughts, couldn't sleep    VACCINATION STATUS: Immunization History  Administered Date(s) Administered   Fluad Quad(high Dose 65+) 09/07/2019, 11/25/2020, 12/12/2022   Hepatitis B 04/23/2013, 05/01/2013   Hepatitis B, PED/ADOLESCENT 09/25/2013   Influenza Whole 07/26/2008   Influenza,inj,Quad PF,6+ Mos 08/13/2014, 09/01/2018   Influenza-Unspecified 08/04/2016, 07/30/2017   PFIZER(Purple Top)SARS-COV-2 Vaccination 06/07/2020, 06/28/2020   Pneumococcal Conjugate-13 11/09/2017   Pneumococcal Polysaccharide-23 03/21/2013, 11/24/2019   Td 08/07/2004   Tdap 06/11/2014   Zoster, Live 08/24/2016    HPI Michelle Vasquez is 72 y.o. female who presents today with a medical history as above. she is being seen in follow-up after she was seen in consultation for hypocortisolemia requested by Eustaquio Boyden, MD.  - She reports that she was diagnosed with Crohn's disease at age 68.  She has received various modalities of treatment over the decades for it including high-dose steroids.  At times she has taken prednisone as high as 80 mg daily. More recently she was dealing with symptoms including dizziness, disequilibrium, weight loss.  -After appropriate workup including ACTH stimulation test confirmed primary adrenal insufficiency, she was started on prednisone 5 mg regular dose daily during her last visit.  She continues to generate slow clinical response.  She has no new complaints today.  She is on medications including amlodipine, budesonide, vitamin D, vitamin B12, folic acid, magnesium gluconate, mercaptopurine, Protonix, potassium supplements.   Review of Systems  Constitutional: + mildly  fluctuating body weight gain, + fatigue, no subjective hyperthermia, no subjective hypothermia   Objective:       06/11/2023   10:07 AM 06/02/2023    8:30 AM 06/02/2023    8:27 AM  Vitals with BMI  Height 4' 11.5"  4' 11.5"  Weight 108 lbs 6 oz  108 lbs 2 oz  BMI 21.54  21.48  Systolic 138 150 409  Diastolic 78 86 88  Pulse 60  65    BP 138/78   Pulse 60   Ht 4' 11.5" (1.511 m)   Wt 108 lb 6.4 oz (49.2 kg)   BMI 21.53 kg/m   Wt Readings from Last 3 Encounters:  06/11/23 108 lb 6.4 oz (49.2 kg)  06/02/23 108 lb 2 oz (49 kg)  05/10/23 106 lb (48.1 kg)    Physical Exam  Constitutional:  Body mass index is 21.53 kg/m.,  not in acute distress, normal state of mind Eyes: PERRLA, EOMI, no exophthalmos ENT: moist mucous membranes, no gross thyromegaly, no gross cervical lymphadenopathy   CMP ( most recent) CMP     Component Value Date/Time   NA 144 06/07/2023 1423   K 4.1 06/07/2023 1423   CL 108 (H) 06/07/2023 1423   CO2 20 06/07/2023 1423   GLUCOSE 110 (H) 06/07/2023 1423   GLUCOSE 73 05/26/2023 0825   BUN 18 06/07/2023 1423   CREATININE 1.42 (H) 06/07/2023 1423   CREATININE 0.84 10/02/2022 1537   CALCIUM 9.3 06/07/2023 1423   PROT 6.8 06/07/2023 1423   ALBUMIN 4.1 06/07/2023 1423   AST 28 06/07/2023 1423   ALT 31 06/07/2023 1423   ALKPHOS 73 06/07/2023 1423   BILITOT 0.5 06/07/2023 1423   GFRNONAA >60 12/12/2022 0639   GFRAA >60 11/21/2019 1238     Diabetic Labs (most recent): Lab Results  Component Value Date   MICROALBUR 5.0 (H) 03/20/2022   MICROALBUR 0.8 11/21/2020   MICROALBUR 1.5 11/20/2019     Lipid Panel ( most  recent) Lipid Panel     Component Value Date/Time   CHOL 192 03/20/2022 1319   TRIG 68.0 03/20/2022 1319   HDL 96.90 03/20/2022 1319   CHOLHDL 2 03/20/2022 1319   VLDL 13.6 03/20/2022 1319   LDLCALC 82 03/20/2022 1319      Lab Results  Component Value Date   TSH 4.070 06/07/2023   TSH 15.33 (H) 05/26/2023   TSH 6.52 (H)  04/20/2023   TSH 2.13 03/20/2022   TSH 3.59 01/31/2018   TSH 2.81 06/04/2014   TSH 3.29 04/09/2010   TSH 2.78 04/03/2009   TSH 2.32 08/03/2007   FREET4 1.11 06/07/2023   FREET4 0.74 05/26/2023    ACTH stimulation test on February 05, 2023  Latest Reference Range & Units 01/26/23 09:49  Cortisol, Base ug/dL <8.6  Cortisol, 30 Min ug/dL 3.8  Cortisol, 60 Min ug/dL 5.0    Assessment & Plan:   1. Adrenal insufficiency (HCC) 2. Hypokalemia Her recent workup confirmed primary adrenal insufficiency  likely related to her chronic steroids exposure. She was initiated on prednisone 5 mg p.o. daily at breakfast.  She presents with clinical and biochemical signs of treatment response.  She has no new complaint today.   She is advised to continue. She will not need specific mineralocorticoid replacement at this time. She will be reassessed with CMP next visit.  She is advised to wear medical alert indicating her diagnosis of adrenal insufficiency.  Sick day steroids rule was discussed with her.   Since she is running tight blood pressure and ongoing dizziness, she is advised to hold amlodipine for now.  She will need a repeat DXA in light of her last DXA showing Osteopenia in 2019, scheduled to get it done in November 2024.   - she is advised to maintain close follow up with Eustaquio Boyden, MD for primary care needs.   I spent  21  minutes in the care of the patient today including review of labs from Thyroid Function, CMP, and other relevant labs ; imaging/biopsy records (current and previous including abstractions from other facilities); face-to-face time discussing  her lab results and symptoms, medications doses, her options of short and long term treatment based on the latest standards of care / guidelines;   and documenting the encounter.  Michelle Vasquez  participated in the discussions, expressed understanding, and voiced agreement with the above plans.  All questions were answered to  her satisfaction. she is encouraged to contact clinic should she have any questions or concerns prior to her return visit.   Follow up plan: Return in about 6 months (around 12/12/2023) for Fasting Labs  in AM B4 8.   Marquis Lunch, MD [facility] [address] [place], [place] [address] Phone: [phone]  Fax: [phone]     06/11/2023, 10:25 AM  This note was partially dictated with voice recognition software. Similar sounding words can be transcribed inadequately or may not  be corrected upon review.

## 2023-06-15 ENCOUNTER — Encounter: Payer: Self-pay | Admitting: Gastroenterology

## 2023-06-15 ENCOUNTER — Other Ambulatory Visit: Payer: Self-pay

## 2023-06-15 ENCOUNTER — Ambulatory Visit (INDEPENDENT_AMBULATORY_CARE_PROVIDER_SITE_OTHER): Payer: HMO | Admitting: Gastroenterology

## 2023-06-15 VITALS — BP 167/62 | HR 74 | Temp 97.4°F | Ht 59.5 in | Wt 107.4 lb

## 2023-06-15 DIAGNOSIS — Z7952 Long term (current) use of systemic steroids: Secondary | ICD-10-CM

## 2023-06-15 DIAGNOSIS — Z8719 Personal history of other diseases of the digestive system: Secondary | ICD-10-CM

## 2023-06-15 DIAGNOSIS — D509 Iron deficiency anemia, unspecified: Secondary | ICD-10-CM

## 2023-06-15 DIAGNOSIS — K5000 Crohn's disease of small intestine without complications: Secondary | ICD-10-CM | POA: Diagnosis not present

## 2023-06-15 DIAGNOSIS — E2740 Unspecified adrenocortical insufficiency: Secondary | ICD-10-CM | POA: Diagnosis not present

## 2023-06-15 MED ORDER — NA SULFATE-K SULFATE-MG SULF 17.5-3.13-1.6 GM/177ML PO SOLN: 1.0000 | Freq: Once | ORAL | 0 refills | Status: AC

## 2023-06-15 NOTE — Progress Notes (Signed)
Arlyss Repress, MD [address]  [address]  [place], [place] [address]  Main: [phone]  Fax: [phone]    Gastroenterology Consultation  Referring Provider:     Eustaquio Boyden, MD Primary Care Physician:  Eustaquio Boyden, MD Primary Gastroenterologist:  Dr. Arlyss Repress Reason for Consultation:   Small bowel Crohn's        HPI:   Michelle Vasquez is a 72 y.o. female referred by Dr. Eustaquio Boyden, MD  for consultation & management of small bowel Crohn's  Initial visit 10/29/2022 Michelle Vasquez is a pleasant 72 year old female with history of Crohn's disease.  She is currently maintained on mercaptopurine 50 mg daily and budesonide 3 mg daily.  Her main symptom is chronic nonbloody diarrhea, has bowel movements up to 6 times daily for which she takes Imodium as needed which leads to constipation.  Her weight has been stable, reports good appetite.  She takes B12 shots and oral iron supplements.  She wanted to switch her care to [facility] to be within [facility] system and stay locally.  Follow-up visit 02/24/2023 Since last visit, patient is diagnosed with secondary adrenal insufficiency, started on prednisone 5 mg daily 2 weeks ago.  Her serum cortisol levels were less than 5.  She reports that her diarrhea has significantly improved and she overall feels significantly better and her appetite has improved since she underwent ACTH stimulation test.  She is currently having 3-4 soft bowel movements compared to 7-10 watery bowel movements prior to the diagnosis of adrenal insufficiency.  She does not take Imodium.  Her weight has been steady.  She continues to take potassium and magnesium supplements.  Taking budesonide 3 mg daily.  She could not undergo EGD and colonoscopy due to significant electrolyte abnormalities  Follow-up visit 06/15/2023 Michelle Vasquez is here for follow-up of Crohn's disease.  She has been doing well with regards to GI symptoms.  Taking Imodium 1  pill a day which keeps her diarrhea under control.  She is not taking budesonide or 6-MP.  Her electrolyte abnormalities have been under control.  She does have mild iron deficiency, on oral iron replacement therapy.   Crohn's disease classification:   Age: <17 Location: Ileal Behavior: stricturing Perianal:no   IBD diagnosis: small bowel Crohn's at age 34   Disease course: Small bowel involving terminal ileum Crohn's at age 52.  Underwent terminal ileum resection in 1981.  She had history of intermittent partial small bowel obstructions.  She has been maintained on low-dose 6-MP 50 mg daily since 2006.  She was found to have postop recurrence of Crohn's disease at the ilio colic anastomosis with narrowing.  Since 2021, patient is also maintained on budesonide 3 mg daily.  Her symptoms are not in remission.  Her symptoms include nonbloody diarrhea, up to 6 times daily associated with some abdominal bloating and pain.  She has chronic iron and B12 deficiency anemia, takes oral iron as well as B12 injections.  Last colonoscopy in 12/2019 revealed narrowing and ulceration of the ileocolonic anastomosis   Extra intestinal manifestations: None   IBD surgical history: Terminal ileal resection in 1981   Imaging:   MRE none CTE 11/25/2020 IMPRESSION: 1. Mild-to-moderate wall thickening and mild mucosal hyperenhancement in the terminal ileum, mildly worsened since 12/30/2018 CT enterography study, compatible with active Crohn disease. No evidence of bowel obstruction, fistula or abscess. No additional sites of active bowel inflammation. 2. Small hiatal hernia. 3. Aortic Atherosclerosis  SBFT none   Procedures:   Colonoscopy 01/15/2020 Perianal skin tags.  Not patent ileocolic anastomosis, ulcerated, biopsies performed 2 subcentimeter polyps in the colon, resected Pathology revealed tubular adenoma Ulcerated small bowel anastomotic mucosa   Upper Endoscopy 11/22/2017 - Benign-appearing  esophageal stenosis. Dilated. - Esophagogastric landmarks identified. - Normal stomach. - Duodenal diverticulum. - The examination was otherwise normal. - No specimens collected.   VCE none   IBD medications:   Steroids: Received prednisone in the past, currently on budesonide 3 mg daily 5-ASA: None Immunomodulators: AZA, methotrexate On 6-MP 50 mg daily since 2006 TPMT status  Biologics: None Anti TNFs: Anti Integrins: Ustekinumab: Tofactinib: Clinical trial:     NSAIDs: None   Antiplts/Anticoagulants/Anti thrombotics: None   Past Medical History:  Diagnosis Date   Allergy    seasonal   Anxiety and depression 07/03/2011   B12 deficiency    Blood transfusion without reported diagnosis    Central hearing loss    COVID-19 virus infection 03/03/2021   Crohn disease (HCC)    s/p colectomy on , ?bacterial overgrowth   Facial cellulitis 01/15/2023   Fatty liver 12/15/2011   History of small bowel obstruction    HTN (hypertension)    Internal hemorrhoids without mention of complication    Osteomalacia, unspecified    Perennial allergic rhinitis with seasonal variation    Squamous cell cancer of skin of left hand 11/2022   Dr Juanita Craver   Unspecified deficiency anemia    Unspecified polyarthropathy or polyarthritis, multiple sites     Past Surgical History:  Procedure Laterality Date   APPENDECTOMY     COLONOSCOPY  02/25/05   Internal hemms; crohns; narrow anast   COLONOSCOPY  02/26/10   Prior right hemicolectomy o/w benign (Dr. Juanda Chance)   COLONOSCOPY  02/2015   benign biopsies, crohn's with stenotic stricture at anastomosis Juanda Chance)   COLONOSCOPY  12/2019   small adenomas, crohn's disease small intestine, diverticulosis, non-patent end to side ileocolonic anastomosis with inflammation and ulceration biopsied, rpt 3 yrs (Brahmbhatt)   DEXA  07/06/2011   normal T score -1.0 femur, spine   ESOPHAGOGASTRODUODENOSCOPY  02/25/05   Dilated stricture    ESOPHAGOGASTRODUODENOSCOPY  10/2017   esophageal stenosis dilated, duodenal diverticuluc (Nandigam)   HEMICOLECTOMY  1980   all small intestine, none large   OTHER SURGICAL HISTORY  1960s, 1978, 1980   ileal resection x 3 and subtotal colon resection   TOTAL ABDOMINAL HYSTERECTOMY  1975   ovaries out as well   UPPER GASTROINTESTINAL ENDOSCOPY       Current Outpatient Medications:    amLODipine (NORVASC) 5 MG tablet, Take 1 tablet (5 mg total) by mouth daily., Disp: 90 tablet, Rfl: 3   Cholecalciferol (VITAMIN D) 50 MCG (2000 UT) CAPS, Take 1 capsule (2,000 Units total) by mouth daily., Disp: 90 capsule, Rfl: 3   cyanocobalamin (VITAMIN B12) 1000 MCG/ML injection, Inject 1 mL (1,000 mcg total) into the skin every 30 (thirty) days., Disp: 10 mL, Rfl: 1   ferrous sulfate (SLOW RELEASE IRON) 160 (50 Fe) MG TBCR SR tablet, Take 1 tablet (160 mg total) by mouth 2 (two) times a week., Disp: 30 tablet, Rfl: 3   folic acid (FOLVITE) 800 MCG tablet, Take 1 tablet (800 mcg total) by mouth daily., Disp: , Rfl:    magnesium chloride (SLOW-MAG) 64 MG TBEC SR tablet, Take 2 tablets by mouth 2 (two) times daily., Disp: , Rfl:    pantoprazole (PROTONIX) 40 MG tablet, Take 40 mg by  mouth daily., Disp: , Rfl:    predniSONE (DELTASONE) 5 MG tablet, Take 1 tablet (5 mg total) by mouth daily with breakfast., Disp: 90 tablet, Rfl: 1   propranolol (INDERAL) 20 MG tablet, Take 1 tablet (20 mg total) by mouth 2 (two) times daily., Disp: 60 tablet, Rfl: 3   SYRINGE/NEEDLE, DISP, 1 ML 23G X 1" 1 ML MISC, Use as directed, Disp: 8 each, Rfl: 0   Na Sulfate-K Sulfate-Mg Sulf 17.5-3.13-1.6 GM/177ML SOLN, Take 1 kit by mouth once for 1 dose., Disp: 354 mL, Rfl: 0   potassium chloride SA (KLOR-CON M) 20 MEQ tablet, Take 20 mEq by mouth 2 (two) times daily. (Patient not taking: Reported on 06/15/2023), Disp: , Rfl:    Family History  Problem Relation Age of Onset   Crohn's disease Father        colostomy   Hypertension  Mother    Heart disease Mother 12   Alcohol abuse Brother    Cirrhosis Brother    Crohn's disease Sister    Crohn's disease Paternal Aunt        x 3   Crohn's disease Other        neice x 2   Colon cancer Neg Hx    Cancer Neg Hx    Diabetes Neg Hx    Rectal cancer Neg Hx    Stomach cancer Neg Hx    Esophageal cancer Neg Hx    Breast cancer Neg Hx      Social History   Tobacco Use   Smoking status: Former    Current packs/day: 0.00    Average packs/day: 0.3 packs/day for 15.0 years (3.8 ttl pk-yrs)    Types: Cigarettes    Start date: 10/27/1963    Quit date: 10/26/1978    Years since quitting: 44.6   Smokeless tobacco: Never  Vaping Use   Vaping status: Never Used  Substance Use Topics   Alcohol use: No   Drug use: No    Allergies as of 06/15/2023 - Review Complete 06/15/2023  Allergen Reaction Noted   Celexa [citalopram hydrobromide] Other (See Comments) 01/10/2014    Review of Systems:    All systems reviewed and negative except where noted in HPI.   Physical Exam:  BP (!) 167/62 (BP Location: Left Arm, Patient Position: Sitting, Cuff Size: Normal)   Pulse 74   Temp (!) 97.4 F (36.3 C) (Oral)   Ht 4' 11.5" (1.511 m)   Wt 107 lb 6.4 oz (48.7 kg)   BMI 21.33 kg/m  No LMP recorded. Patient has had a hysterectomy.  General:   Alert,  Well-developed, well-nourished, pleasant and cooperative in NAD Head:  Normocephalic and atraumatic. Eyes:  Sclera clear, no icterus.   Conjunctiva pink. Ears:  Normal auditory acuity. Nose:  No deformity, discharge, or lesions. Mouth:  No deformity or lesions,oropharynx pink & moist. Neck:  Supple; no masses or thyromegaly. Lungs:  Respirations even and unlabored.  Clear throughout to auscultation.   No wheezes, crackles, or rhonchi. No acute distress. Heart:  Regular rate and rhythm; no murmurs, clicks, rubs, or gallops. Abdomen:  Normal bowel sounds. Soft, non-tender and non-distended without masses, hepatosplenomegaly or  hernias noted.  No guarding or rebound tenderness.   Rectal: Not performed Msk:  Symmetrical without gross deformities. Good, equal movement & strength bilaterally. Pulses:  Normal pulses noted. Extremities:  No clubbing or edema.  No cyanosis. Neurologic:  Alert and oriented x3;  grossly normal neurologically. Skin:  Intact without  significant lesions or rashes. No jaundice. Psych:  Alert and cooperative. Normal mood and affect.  Imaging Studies: Reviewed  Assessment and Plan:   Michelle Vasquez is a 72 y.o. pleasant Caucasian female with small bowel Crohn's diagnosed at age 60, s/p ileocecal resection in 1981, with postop recurrence at the anastomosis, previously maintained on mercaptopurine and budesonide, both have been discontinued.  Diagnosed with secondary adrenal insufficiency secondary to chronic steroid use for her Crohn's disease, maintained on low-dose prednisone.   Fecal calprotectin levels were normal, celiac disease panel negative Hypomagnesemia and hypokalemia have resolved.  Will proceed with upper endoscopy and colonoscopy to assess activity of Crohn's disease Has mild iron deficiency Continue prednisone 5 mg daily per her endocrinologist for adrenal insufficiency   Follow up in 4 months   Arlyss Repress, MD

## 2023-07-06 ENCOUNTER — Ambulatory Visit: Payer: Self-pay

## 2023-07-06 NOTE — Patient Outreach (Signed)
  Care Coordination   Follow Up Visit Note   07/06/2023 Name: Michelle Vasquez MRN: [id number] DOB: 1950/11/26  Michelle Vasquez is a 72 y.o. year old female who sees Eustaquio Boyden, MD for primary care. I spoke with  Michelle Vasquez by phone today.  What matters to the patients health and wellness today?  Patient feels her financial situation is better.  Does not request an additional follow up.  Patient agreed to continue working with her credit card company on the payments.    Goals Addressed             This Visit's Progress    Money management       Interventions Today    Flowsheet Row Most Recent Value  General Interventions   General Interventions Discussed/Reviewed General Interventions Discussed, General Interventions Reviewed  [Pt reports she called [employer] and made payment arrangements.  By Nov 24 gas should be restored.  Pt reports she is stable now financially.  Pt reports no update on [employer] waiting list.  Pt feels Sept bills are better. Pt goal is completed.]              SDOH assessments and interventions completed:  No     Care Coordination Interventions:  Yes, provided   Follow up plan: No further intervention required.   Encounter Outcome:  Patient Visit Completed

## 2023-07-06 NOTE — Patient Instructions (Signed)
Visit Information  Thank you for taking time to visit with me today. Please don't hesitate to contact me if I can be of assistance to you.   Following are the goals we discussed today:   Patient complete goals.  [employer] bill will be resolved and restored by Nov 24.  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   If you are experiencing a Mental Health or Behavioral Health Crisis or need someone to talk to, please call 911  Patient verbalizes understanding of instructions and care plan provided today and agrees to view in MyChart. Active MyChart status and patient understanding of how to access instructions and care plan via MyChart confirmed with patient.     No further follow up required: Goal completed per patient decline additional visit.  Lysle Morales, BSW Social Worker [phone]

## 2023-07-27 ENCOUNTER — Encounter: Payer: Self-pay | Admitting: Gastroenterology

## 2023-07-27 HISTORY — PX: COLONOSCOPY: SHX174

## 2023-07-27 HISTORY — PX: ESOPHAGOGASTRODUODENOSCOPY: SHX1529

## 2023-07-28 ENCOUNTER — Ambulatory Visit: Payer: HMO | Admitting: Certified Registered"

## 2023-07-28 ENCOUNTER — Encounter
Admission: RE | Disposition: A | Discharge status: Home / Self Care | Payer: Self-pay | Source: Home / Self Care | Attending: Gastroenterology

## 2023-07-28 ENCOUNTER — Encounter: Payer: Self-pay | Admitting: Gastroenterology

## 2023-07-28 ENCOUNTER — Ambulatory Visit
Admission: RE | Admit: 2023-07-28 | Discharge: 2023-07-28 | Disposition: A | Discharge status: Home / Self Care | Payer: HMO | Attending: Gastroenterology | Admitting: Gastroenterology

## 2023-07-28 DIAGNOSIS — K5090 Crohn's disease, unspecified, without complications: Secondary | ICD-10-CM | POA: Diagnosis not present

## 2023-07-28 DIAGNOSIS — Z87891 Personal history of nicotine dependence: Secondary | ICD-10-CM | POA: Insufficient documentation

## 2023-07-28 DIAGNOSIS — K449 Diaphragmatic hernia without obstruction or gangrene: Secondary | ICD-10-CM | POA: Diagnosis not present

## 2023-07-28 DIAGNOSIS — K317 Polyp of stomach and duodenum: Secondary | ICD-10-CM | POA: Diagnosis not present

## 2023-07-28 DIAGNOSIS — K319 Disease of stomach and duodenum, unspecified: Secondary | ICD-10-CM

## 2023-07-28 DIAGNOSIS — Z8719 Personal history of other diseases of the digestive system: Secondary | ICD-10-CM

## 2023-07-28 DIAGNOSIS — D125 Benign neoplasm of sigmoid colon: Secondary | ICD-10-CM | POA: Insufficient documentation

## 2023-07-28 DIAGNOSIS — Z9049 Acquired absence of other specified parts of digestive tract: Secondary | ICD-10-CM | POA: Diagnosis not present

## 2023-07-28 DIAGNOSIS — Z980 Intestinal bypass and anastomosis status: Secondary | ICD-10-CM | POA: Diagnosis not present

## 2023-07-28 DIAGNOSIS — K5710 Diverticulosis of small intestine without perforation or abscess without bleeding: Secondary | ICD-10-CM | POA: Diagnosis not present

## 2023-07-28 DIAGNOSIS — T478X5A Adverse effect of other agents primarily affecting gastrointestinal system, initial encounter: Secondary | ICD-10-CM

## 2023-07-28 DIAGNOSIS — D509 Iron deficiency anemia, unspecified: Secondary | ICD-10-CM | POA: Diagnosis not present

## 2023-07-28 DIAGNOSIS — F418 Other specified anxiety disorders: Secondary | ICD-10-CM | POA: Insufficient documentation

## 2023-07-28 DIAGNOSIS — D126 Benign neoplasm of colon, unspecified: Secondary | ICD-10-CM

## 2023-07-28 DIAGNOSIS — D124 Benign neoplasm of descending colon: Secondary | ICD-10-CM | POA: Diagnosis not present

## 2023-07-28 DIAGNOSIS — K3189 Other diseases of stomach and duodenum: Secondary | ICD-10-CM | POA: Insufficient documentation

## 2023-07-28 DIAGNOSIS — K5000 Crohn's disease of small intestine without complications: Secondary | ICD-10-CM | POA: Diagnosis not present

## 2023-07-28 DIAGNOSIS — I10 Essential (primary) hypertension: Secondary | ICD-10-CM | POA: Diagnosis not present

## 2023-07-28 DIAGNOSIS — K219 Gastro-esophageal reflux disease without esophagitis: Secondary | ICD-10-CM | POA: Diagnosis not present

## 2023-07-28 DIAGNOSIS — K2950 Unspecified chronic gastritis without bleeding: Secondary | ICD-10-CM | POA: Diagnosis not present

## 2023-07-28 DIAGNOSIS — K635 Polyp of colon: Secondary | ICD-10-CM | POA: Diagnosis not present

## 2023-07-28 DIAGNOSIS — F32A Depression, unspecified: Secondary | ICD-10-CM | POA: Insufficient documentation

## 2023-07-28 DIAGNOSIS — K644 Residual hemorrhoidal skin tags: Secondary | ICD-10-CM | POA: Diagnosis not present

## 2023-07-28 DIAGNOSIS — D123 Benign neoplasm of transverse colon: Secondary | ICD-10-CM | POA: Insufficient documentation

## 2023-07-28 HISTORY — PX: ESOPHAGOGASTRODUODENOSCOPY (EGD) WITH PROPOFOL: SHX5813

## 2023-07-28 HISTORY — PX: BIOPSY: SHX5522

## 2023-07-28 HISTORY — PX: POLYPECTOMY: SHX149

## 2023-07-28 HISTORY — PX: COLONOSCOPY WITH PROPOFOL: SHX5780

## 2023-07-28 SURGERY — COLONOSCOPY WITH PROPOFOL
Anesthesia: General

## 2023-07-28 MED ORDER — SODIUM CHLORIDE 0.9 % IV SOLN
INTRAVENOUS | Status: DC
  Administered 2023-07-28: 20 mL/h via INTRAVENOUS

## 2023-07-28 MED ORDER — PROPOFOL 10 MG/ML IV BOLUS
INTRAVENOUS | Status: DC | PRN
Start: 2023-07-28 — End: 2023-07-28
  Administered 2023-07-28: 30 mg via INTRAVENOUS
  Administered 2023-07-28 (×12): 20 mg via INTRAVENOUS
  Administered 2023-07-28: 40 mg via INTRAVENOUS
  Administered 2023-07-28 (×9): 20 mg via INTRAVENOUS

## 2023-07-28 MED ORDER — LIDOCAINE HCL (PF) 1 % IJ SOLN
INTRAMUSCULAR | Status: DC | PRN
Start: 2023-07-28 — End: 2023-07-28
  Administered 2023-07-28: 40 mg

## 2023-07-28 NOTE — Transfer of Care (Signed)
Immediate Anesthesia Transfer of Care Note  Patient: Michelle Vasquez  Procedure(s) Performed: COLONOSCOPY WITH PROPOFOL ESOPHAGOGASTRODUODENOSCOPY (EGD) WITH PROPOFOL BIOPSY POLYPECTOMY INTESTINAL  Patient Location: PACU and Endoscopy Unit  Anesthesia Type:MAC  Level of Consciousness: drowsy  Airway & Oxygen Therapy: Patient Spontanous Breathing and Patient connected to nasal cannula oxygen  Post-op Assessment: Report given to RN and Post -op Vital signs reviewed and stable  Post vital signs: Reviewed and stable  Last Vitals:  Vitals Value Taken Time  BP 117/57 07/28/23 1152  Temp    Pulse 65 07/28/23 1152  Resp 14 07/28/23 1152  SpO2 98 % 07/28/23 1152  Vitals shown include unfiled device data.  Last Pain:  Vitals:   07/28/23 1027  TempSrc: Temporal  PainSc: 0-No pain         Complications: No notable events documented.

## 2023-07-28 NOTE — Op Note (Signed)
[facility] Gastroenterology Patient Name: Michelle Vasquez Procedure Date: 07/28/2023 10:31 AM MRN: [id number] Account #: [id number] Date of Birth: 01-Sep-1951 Admit Type: Outpatient Age: 72 Room: [facility] ENDO ROOM 1 Gender: Female Note Status: Finalized Instrument Name: Peds Colonoscope [id number] Procedure:             Colonoscopy Indications:           Follow-up of Crohn's disease of the small bowel,                         Disease activity assessment of Crohn's disease of the                         small bowel Providers:             Toney Reil MD, MD Referring MD:          Eustaquio Boyden (Referring MD) Medicines:             General Anesthesia Complications:         No immediate complications. Estimated blood loss:                         Minimal. Procedure:             Pre-Anesthesia Assessment:                        - Prior to the procedure, a History and Physical was                         performed, and patient medications and allergies were                         reviewed. The patient is competent. The risks and                         benefits of the procedure and the sedation options and                         risks were discussed with the patient. All questions                         were answered and informed consent was obtained.                         Patient identification and proposed procedure were                         verified by the physician, the nurse, the                         anesthesiologist, the anesthetist and the technician                         in the pre-procedure area in the procedure room in the                         endoscopy suite. Mental Status Examination: alert and  oriented. Airway Examination: normal oropharyngeal                         airway and neck mobility. Respiratory Examination:                         clear to auscultation. CV Examination: normal.                          Prophylactic Antibiotics: The patient does not require                         prophylactic antibiotics. Prior Anticoagulants: The                         patient has taken no anticoagulant or antiplatelet                         agents. ASA Grade Assessment: II - A patient with mild                         systemic disease. After reviewing the risks and                         benefits, the patient was deemed in satisfactory                         condition to undergo the procedure. The anesthesia                         plan was to use general anesthesia. Immediately prior                         to administration of medications, the patient was                         re-assessed for adequacy to receive sedatives. The                         heart rate, respiratory rate, oxygen saturations,                         blood pressure, adequacy of pulmonary ventilation, and                         response to care were monitored throughout the                         procedure. The physical status of the patient was                         re-assessed after the procedure.                        After obtaining informed consent, the colonoscope was                         passed under direct vision. Throughout the procedure,  the patient's blood pressure, pulse, and oxygen                         saturations were monitored continuously. The                         Colonoscope was introduced through the anus and                         advanced to the the ileocolonic anastomosis. The                         colonoscopy was performed without difficulty. The                         patient tolerated the procedure well. The quality of                         the bowel preparation was evaluated using the BBPS                         [facility] Bowel Preparation Scale) with scores of: Right                         Colon = 3, Transverse Colon = 3 and Left Colon = 3                          (entire mucosa seen well with no residual staining,                         small fragments of stool or opaque liquid). The total                         BBPS score equals 9. The ileocecal valve, appendiceal                         orifice, and rectum were photographed. Findings:      The perianal and digital rectal examinations were normal. Pertinent       negatives include normal sphincter tone and no palpable rectal lesions.      There was evidence of a prior end-to-side ileo-colonic anastomosis in       the ascending colon. This was patent and was characterized by healthy       appearing mucosa. The anastomosis could not be traversed. Biopsies were       taken with a cold forceps for histology.      Normal mucosa was found in the left colon and in the right colon.       Biopsies were taken with a cold forceps for histology.      Two sessile polyps were found in the transverse colon. The polyps were 4       to 5 mm in size. These polyps were removed with a cold snare. Resection       and retrieval were complete.      Three sessile polyps were found in the descending colon. The polyps were       5 to 9 mm in size. These polyps were removed with a cold snare.  Resection and retrieval were complete.      A small polyp was found in the descending colon. The polyp was sessile.       The polyp was removed with a cold biopsy forceps. Resection and       retrieval were complete.      Six sessile polyps were found in the sigmoid colon. The polyps were 3 to       9 mm in size. These polyps were removed with a cold snare. Resection and       retrieval were complete. Estimated blood loss was minimal. To stop       bleeding after the polypectomy, hemostatic spray was deployed. Multiple       sprays were applied. There was no bleeding at the end of the procedure.      Non-bleeding external hemorrhoids were found during retroflexion. The       hemorrhoids were large. Impression:             - Patent end-to-side ileo-colonic anastomosis,                         characterized by healthy appearing mucosa. Biopsied.                        - Normal mucosa in the left colon and in the right                         colon. Biopsied.                        - Two 4 to 5 mm polyps in the transverse colon,                         removed with a cold snare. Resected and retrieved.                        - Three 5 to 9 mm polyps in the descending colon,                         removed with a cold snare. Resected and retrieved.                        - One small polyp in the descending colon, removed                         with a cold biopsy forceps. Resected and retrieved.                        - Six 3 to 9 mm polyps in the sigmoid colon, removed                         with a cold snare. Resected and retrieved. Hemostatic                         spray applied.                        - Non-bleeding external hemorrhoids. Recommendation:        - Discharge patient to home (with escort).                        -  Resume previous diet today.                        - Continue present medications.                        - Await pathology results.                        - Repeat colonoscopy in 1 year for surveillance of                         multiple polyps.                        - Return to my office as previously scheduled. Procedure Code(s):     --- Professional ---                        [phone], Colonoscopy, flexible; with removal of                         tumor(s), polyp(s), or other lesion(s) by snare                         technique                        [id number], 59, Colonoscopy, flexible; with biopsy, single                         or multiple Diagnosis Code(s):     --- Professional ---                        Z98.0, Intestinal bypass and anastomosis status                        K64.4, Residual hemorrhoidal skin tags                        D12.3, Benign neoplasm of transverse  colon (hepatic                         flexure or splenic flexure)                        D12.4, Benign neoplasm of descending colon                        D12.5, Benign neoplasm of sigmoid colon                        K50.00, Crohn's disease of small intestine without                         complications CPT copyright 2022 American Medical Association. All rights reserved. The codes documented in this report are preliminary and upon coder review may  be revised to meet current compliance requirements. Dr. Libby Maw Toney Reil MD, MD 07/28/2023 11:52:53 AM This report has been signed electronically. Number of Addenda: 0 Note Initiated On: 07/28/2023 10:31 AM Scope Withdrawal Time: 0 hours 39 minutes 44  seconds  Total Procedure Duration: 0 hours 45 minutes 13 seconds  Estimated Blood Loss:  Estimated blood loss was minimal.      [facility]

## 2023-07-28 NOTE — Anesthesia Preprocedure Evaluation (Signed)
Anesthesia Evaluation  Patient identified by MRN, date of birth, ID band Patient awake    Reviewed: Allergy & Precautions, H&P , NPO status , Patient's Chart, lab work & pertinent test results, reviewed documented beta blocker date and time   History of Anesthesia Complications Negative for: history of anesthetic complications  Airway Mallampati: II  TM Distance: >3 FB Neck ROM: full    Dental  (+) Dental Advidsory Given, Missing, Teeth Intact   Pulmonary neg pulmonary ROS, Continuous Positive Airway Pressure Ventilation , former smoker   Pulmonary exam normal breath sounds clear to auscultation       Cardiovascular Exercise Tolerance: Good hypertension, (-) angina (-) Past MI and (-) Cardiac Stents Normal cardiovascular exam(-) dysrhythmias + Valvular Problems/Murmurs  Rhythm:regular Rate:Normal     Neuro/Psych  PSYCHIATRIC DISORDERS Anxiety Depression    negative neurological ROS     GI/Hepatic Neg liver ROS,GERD  Medicated and Controlled,,  Endo/Other  negative endocrine ROS    Renal/GU negative Renal ROS  negative genitourinary   Musculoskeletal   Abdominal   Peds  Hematology  (+) Blood dyscrasia, anemia   Anesthesia Other Findings Past Medical History: No date: Allergy     Comment:  seasonal 07/03/2011: Anxiety and depression No date: B12 deficiency No date: Blood transfusion without reported diagnosis No date: Central hearing loss 03/03/2021: COVID-19 virus infection No date: Crohn disease (HCC)     Comment:  s/p colectomy on , ?bacterial overgrowth 01/15/2023: Facial cellulitis 12/15/2011: Fatty liver No date: History of small bowel obstruction No date: HTN (hypertension) No date: Internal hemorrhoids without mention of complication No date: Osteomalacia, unspecified No date: Perennial allergic rhinitis with seasonal variation 11/2022: Squamous cell cancer of skin of left hand     Comment:  Dr  Juanita Craver No date: Unspecified deficiency anemia No date: Unspecified polyarthropathy or polyarthritis, multiple sites   Reproductive/Obstetrics negative OB ROS                             Anesthesia Physical Anesthesia Plan  ASA: 2  Anesthesia Plan: General   Post-op Pain Management:    Induction: Intravenous  PONV Risk Score and Plan: 3 and Propofol infusion and TIVA  Airway Management Planned: Natural Airway and Nasal Cannula  Additional Equipment:   Intra-op Plan:   Post-operative Plan:   Informed Consent: I have reviewed the patients History and Physical, chart, labs and discussed the procedure including the risks, benefits and alternatives for the proposed anesthesia with the patient or authorized representative who has indicated his/her understanding and acceptance.     Dental Advisory Given  Plan Discussed with: Anesthesiologist, CRNA and Surgeon  Anesthesia Plan Comments:        Anesthesia Quick Evaluation

## 2023-07-28 NOTE — Op Note (Signed)
[facility] Gastroenterology Patient Name: Michelle Vasquez Procedure Date: 07/28/2023 10:33 AM MRN: [id number] Account #: [id number] Date of Birth: November 06, 1950 Admit Type: Outpatient Age: 72 Room: [facility] ENDO ROOM 1 Gender: Female Note Status: Finalized Instrument Name: Patton Salles Endoscope [id number] Procedure:             Upper GI endoscopy Indications:           Crohn's disease Providers:             Toney Reil MD, MD Referring MD:          Eustaquio Boyden (Referring MD) Medicines:             General Anesthesia Complications:         No immediate complications. Estimated blood loss: None. Procedure:             Pre-Anesthesia Assessment:                        - Prior to the procedure, a History and Physical was                         performed, and patient medications and allergies were                         reviewed. The patient is competent. The risks and                         benefits of the procedure and the sedation options and                         risks were discussed with the patient. All questions                         were answered and informed consent was obtained.                         Patient identification and proposed procedure were                         verified by the physician, the nurse, the                         anesthesiologist, the anesthetist and the technician                         in the pre-procedure area in the procedure room in the                         endoscopy suite. Mental Status Examination: alert and                         oriented. Airway Examination: normal oropharyngeal                         airway and neck mobility. Respiratory Examination:                         clear to auscultation. CV Examination: normal.  Prophylactic Antibiotics: The patient does not require                         prophylactic antibiotics. Prior Anticoagulants: The                         patient has taken  no anticoagulant or antiplatelet                         agents. ASA Grade Assessment: II - A patient with mild                         systemic disease. After reviewing the risks and                         benefits, the patient was deemed in satisfactory                         condition to undergo the procedure. The anesthesia                         plan was to use general anesthesia. Immediately prior                         to administration of medications, the patient was                         re-assessed for adequacy to receive sedatives. The                         heart rate, respiratory rate, oxygen saturations,                         blood pressure, adequacy of pulmonary ventilation, and                         response to care were monitored throughout the                         procedure. The physical status of the patient was                         re-assessed after the procedure.                        After obtaining informed consent, the endoscope was                         passed under direct vision. Throughout the procedure,                         the patient's blood pressure, pulse, and oxygen                         saturations were monitored continuously. The Endoscope                         was introduced through the mouth, and advanced to the  second part of duodenum. The upper GI endoscopy was                         accomplished without difficulty. The patient tolerated                         the procedure well. Findings:      The duodenal bulb and second portion of the duodenum were normal.       Biopsies were taken with a cold forceps for histology.      A large non-bleeding diverticulum was found in the second portion of the       duodenum.      The entire examined stomach was normal. Biopsies were taken with a cold       forceps for histology.      The cardia and gastric fundus were normal on retroflexion.       Esophagogastric landmarks were identified: the gastroesophageal junction       was found at 35 cm from the incisors.      The gastroesophageal junction and examined esophagus were normal.      A small hiatal hernia was present. Impression:            - Normal duodenal bulb and second portion of the                         duodenum. Biopsied.                        - Non-bleeding duodenal diverticulum.                        - Normal stomach. Biopsied.                        - Esophagogastric landmarks identified.                        - Normal gastroesophageal junction and esophagus.                        - Small hiatal hernia. Recommendation:        - Await pathology results.                        - Proceed with colonoscopy as scheduled                        See colonoscopy report Procedure Code(s):     --- Professional ---                        [phone], Esophagogastroduodenoscopy, flexible,                         transoral; with biopsy, single or multiple Diagnosis Code(s):     --- Professional ---                        K50.90, Crohn's disease, unspecified, without                         complications  K57.10, Diverticulosis of small intestine without                         perforation or abscess without bleeding CPT copyright 2022 American Medical Association. All rights reserved. The codes documented in this report are preliminary and upon coder review may  be revised to meet current compliance requirements. Dr. Libby Maw Toney Reil MD, MD 07/28/2023 11:02:21 AM This report has been signed electronically. Number of Addenda: 0 Note Initiated On: 07/28/2023 10:33 AM Estimated Blood Loss:  Estimated blood loss: none. Estimated blood loss: none.      [facility]

## 2023-07-28 NOTE — H&P (Signed)
Arlyss Repress, MD [address]  [address]  [place], [place] [address]  Main: [phone]  Fax: [phone] Pager: [phone]  Primary Care Physician:  Eustaquio Boyden, MD Primary Gastroenterologist:  Dr. Arlyss Repress  Pre-Procedure History & Physical: HPI:  Michelle Vasquez is a 72 y.o. female is here for an endoscopy and colonoscopy.   Past Medical History:  Diagnosis Date   Allergy    seasonal   Anxiety and depression 07/03/2011   B12 deficiency    Blood transfusion without reported diagnosis    Central hearing loss    COVID-19 virus infection 03/03/2021   Crohn disease (HCC)    s/p colectomy on , ?bacterial overgrowth   Facial cellulitis 01/15/2023   Fatty liver 12/15/2011   History of small bowel obstruction    HTN (hypertension)    Internal hemorrhoids without mention of complication    Osteomalacia, unspecified    Perennial allergic rhinitis with seasonal variation    Squamous cell cancer of skin of left hand 11/2022   Dr Juanita Craver   Unspecified deficiency anemia    Unspecified polyarthropathy or polyarthritis, multiple sites     Past Surgical History:  Procedure Laterality Date   APPENDECTOMY     COLONOSCOPY  02/25/05   Internal hemms; crohns; narrow anast   COLONOSCOPY  02/26/10   Prior right hemicolectomy o/w benign (Dr. Juanda Chance)   COLONOSCOPY  02/2015   benign biopsies, crohn's with stenotic stricture at anastomosis Juanda Chance)   COLONOSCOPY  12/2019   small adenomas, crohn's disease small intestine, diverticulosis, non-patent end to side ileocolonic anastomosis with inflammation and ulceration biopsied, rpt 3 yrs (Brahmbhatt)   DEXA  07/06/2011   normal T score -1.0 femur, spine   ESOPHAGOGASTRODUODENOSCOPY  02/25/05   Dilated stricture   ESOPHAGOGASTRODUODENOSCOPY  10/2017   esophageal stenosis dilated, duodenal diverticuluc (Nandigam)   HEMICOLECTOMY  1980   all small intestine, none large   OTHER SURGICAL HISTORY  1960s, 1978, 1980   ileal  resection x 3 and subtotal colon resection   TOTAL ABDOMINAL HYSTERECTOMY  1975   ovaries out as well   UPPER GASTROINTESTINAL ENDOSCOPY      Prior to Admission medications   Medication Sig Start Date End Date Taking? Authorizing Provider  amLODipine (NORVASC) 5 MG tablet Take 1 tablet (5 mg total) by mouth daily. 04/20/23   Eustaquio Boyden, MD  Cholecalciferol (VITAMIN D) 50 MCG (2000 UT) CAPS Take 1 capsule (2,000 Units total) by mouth daily. 11/15/18   Eustaquio Boyden, MD  cyanocobalamin (VITAMIN B12) 1000 MCG/ML injection Inject 1 mL (1,000 mcg total) into the skin every 30 (thirty) days. 06/02/23   Eustaquio Boyden, MD  ferrous sulfate (SLOW RELEASE IRON) 160 (50 Fe) MG TBCR SR tablet Take 1 tablet (160 mg total) by mouth 2 (two) times a week. 04/29/23   Eustaquio Boyden, MD  folic acid (FOLVITE) 800 MCG tablet Take 1 tablet (800 mcg total) by mouth daily. 04/25/21   Eustaquio Boyden, MD  magnesium chloride (SLOW-MAG) 64 MG TBEC SR tablet Take 2 tablets by mouth 2 (two) times daily.    [provider]  pantoprazole (PROTONIX) 40 MG tablet Take 40 mg by mouth daily.    [provider]  potassium chloride SA (KLOR-CON M) 20 MEQ tablet Take 20 mEq by mouth 2 (two) times daily. Patient not taking: Reported on 06/15/2023 02/25/23   [provider]  predniSONE (DELTASONE) 5 MG tablet Take 1 tablet (5 mg total) by mouth daily with  breakfast. 02/09/23   Roma Kayser, MD  propranolol (INDERAL) 20 MG tablet Take 1 tablet (20 mg total) by mouth 2 (two) times daily. 04/27/23   Eustaquio Boyden, MD  SYRINGE/NEEDLE, DISP, 1 ML 23G X 1" 1 ML MISC Use as directed 07/31/22   Eustaquio Boyden, MD    Allergies as of 06/15/2023 - Review Complete 06/15/2023  Allergen Reaction Noted   Celexa [citalopram hydrobromide] Other (See Comments) 01/10/2014    Family History  Problem Relation Age of Onset   Crohn's disease Father        colostomy   Hypertension Mother    Heart  disease Mother 81   Alcohol abuse Brother    Cirrhosis Brother    Crohn's disease Sister    Crohn's disease Paternal Aunt        x 3   Crohn's disease Other        neice x 2   Colon cancer Neg Hx    Cancer Neg Hx    Diabetes Neg Hx    Rectal cancer Neg Hx    Stomach cancer Neg Hx    Esophageal cancer Neg Hx    Breast cancer Neg Hx     Social History   Socioeconomic History   Marital status: Divorced    Spouse name: Not on file   Number of children: 3   Years of education: Not on file   Highest education level: 12th grade  Occupational History   Occupation: Medical Transcription    Employer: OTHER    Comment: [employer]  Tobacco Use   Smoking status: Former    Current packs/day: 0.00    Average packs/day: 0.3 packs/day for 15.0 years (3.8 ttl pk-yrs)    Types: Cigarettes    Start date: 10/27/1963    Quit date: 10/26/1978    Years since quitting: 44.7   Smokeless tobacco: Never  Vaping Use   Vaping status: Never Used  Substance and Sexual Activity   Alcohol use: No   Drug use: No   Sexual activity: Not on file  Other Topics Concern   Not on file  Social History Narrative   Caffeine: 2 cups coffee/day   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, no regular activity   Diet: good water, fruits/vegetables occasionally   Social Determinants of Health   Financial Resource Strain: High Risk (05/10/2023)   Overall Financial Resource Strain (CARDIA)    Difficulty of Paying Living Expenses: Very hard  Food Insecurity: Food Insecurity Present (05/12/2023)   Hunger Vital Sign    Worried About Running Out of Food in the Last Year: Sometimes true    Ran Out of Food in the Last Year: Sometimes true  Transportation Needs: No Transportation Needs (05/12/2023)   PRAPARE - Administrator, Civil Service (Medical): No    Lack of Transportation (Non-Medical): No  Physical Activity: Sufficiently Active (05/10/2023)    Exercise Vital Sign    Days of Exercise per Week: 7 days    Minutes of Exercise per Session: 80 min  Recent Concern: Physical Activity - Insufficiently Active (04/19/2023)   Exercise Vital Sign    Days of Exercise per Week: 7 days    Minutes of Exercise per Session: 10 min  Stress: No Stress Concern Present (05/10/2023)   [employer] of Occupational Health - Occupational Stress Questionnaire    Feeling of Stress : Not at all  Recent Concern:  Stress - Stress Concern Present (04/19/2023)   [employer] of Occupational Health - Occupational Stress Questionnaire    Feeling of Stress : Rather much  Social Connections: Moderately Isolated (05/10/2023)   Social Connection and Isolation Panel [NHANES]    Frequency of Communication with Friends and Family: Twice a week    Frequency of Social Gatherings with Friends and Family: Twice a week    Attends Religious Services: More than 4 times per year    Active Member of [employer] or Organizations: No    Attends Banker Meetings: Never    Marital Status: Divorced  Catering manager Violence: Not At Risk (05/10/2023)   Humiliation, Afraid, Rape, and Kick questionnaire    Fear of Current or Ex-Partner: No    Emotionally Abused: No    Physically Abused: No    Sexually Abused: No    Review of Systems: See HPI, otherwise negative ROS  Physical Exam: BP (!) 158/70   Pulse 73   Temp (!) 97.2 F (36.2 C) (Temporal)   Resp 20   Ht 4\' 11"  (1.499 m)   Wt 49.3 kg   SpO2 100%   BMI 21.93 kg/m  General:   Alert,  pleasant and cooperative in NAD Head:  Normocephalic and atraumatic. Neck:  Supple; no masses or thyromegaly. Lungs:  Clear throughout to auscultation.    Heart:  Regular rate and rhythm. Abdomen:  Soft, nontender and nondistended. Normal bowel sounds, without guarding, and without rebound.   Neurologic:  Alert and  oriented x4;  grossly normal neurologically.  Impression/Plan: TASHI ANDUJO is here for an endoscopy  and colonoscopy to be performed for activity of crohn's disease  Risks, benefits, limitations, and alternatives regarding  endoscopy and colonoscopy have been reviewed with the patient.  Questions have been answered.  All parties agreeable.   Lannette Donath, MD  07/28/2023, 10:45 AM

## 2023-07-29 ENCOUNTER — Encounter: Payer: Self-pay | Admitting: Family Medicine

## 2023-07-30 ENCOUNTER — Encounter: Payer: Self-pay | Admitting: Family Medicine

## 2023-07-30 ENCOUNTER — Ambulatory Visit (INDEPENDENT_AMBULATORY_CARE_PROVIDER_SITE_OTHER): Payer: HMO | Admitting: Family Medicine

## 2023-07-30 VITALS — BP 160/74 | HR 63 | Temp 97.9°F | Ht 59.0 in | Wt 108.2 lb

## 2023-07-30 DIAGNOSIS — E2740 Unspecified adrenocortical insufficiency: Secondary | ICD-10-CM | POA: Diagnosis not present

## 2023-07-30 DIAGNOSIS — K50118 Crohn's disease of large intestine with other complication: Secondary | ICD-10-CM

## 2023-07-30 DIAGNOSIS — Z23 Encounter for immunization: Secondary | ICD-10-CM | POA: Diagnosis not present

## 2023-07-30 DIAGNOSIS — M542 Cervicalgia: Secondary | ICD-10-CM | POA: Diagnosis not present

## 2023-07-30 DIAGNOSIS — I10 Essential (primary) hypertension: Secondary | ICD-10-CM

## 2023-07-30 LAB — SURGICAL PATHOLOGY

## 2023-07-30 LAB — POCT RAPID STREP A (OFFICE): Rapid Strep A Screen: NEGATIVE

## 2023-07-30 MED ORDER — AMOXICILLIN 875 MG PO TABS: 875.0000 mg | ORAL_TABLET | Freq: Two times a day (BID) | ORAL | 0 refills | Status: AC

## 2023-07-30 NOTE — Assessment & Plan Note (Signed)
RTC 6 wks HTN f/u visit with BP cuff to compare. ?prednisone effect as worse control noted since starting prednisone.  BP log provided to monitor at home.  Consider ACEI/ARB if further antihypertensive needed, avoid diuretic and aldosterone antagonist in adrenal insufficiency.

## 2023-07-30 NOTE — Patient Instructions (Addendum)
Flu shot today  Strep swab today - negative. I would like to put you on an antibiotic course-take amoxicillin twice daily for 10 days.  Return in 1 to 2 months for hypertension follow-up visit, bring in your blood pressure cuff to that visit to compare to our readings.  Continue monitoring blood pressures at home with blood pressure log provided, let us know consistently over 140/90.

## 2023-07-30 NOTE — Progress Notes (Addendum)
Ph: [phone] Fax: [phone]   Patient ID: Michelle Vasquez, female    DOB: [birth year], 72 y.o.   MRN: [id number]  This visit was conducted in person.  BP (!) 160/74   Pulse 63   Temp 97.9 F (36.6 C) (Oral)   Ht 4\' 11"  (1.499 m)   Wt 108 lb 4 oz (49.1 kg)   SpO2 97%   BMI 21.86 kg/m   BP 160/80s on repeat testing.  Home BP 130s/70s Endorses component of white coat hypertension  BP Readings from Last 3 Encounters:  07/30/23 (!) 160/74  07/28/23 112/86  06/15/23 (!) 167/62    CC: R ear pain, neck pain  Subjective:   Discussed the use of AI scribe software for clinical note transcription with the patient, who gave verbal consent to proceed.  History of Present Illness   [name], a 72 year old with a history of hypertension, Crohn's disease, and adrenal insufficiency managed with prednisone 5mg  daily, presents with a 4-day history of right-sided sore throat and ear pain. The discomfort began under the jaw and radiated into the ear and sinuses, and was associated with difficulty swallowing. She also experienced a low-grade fever and chills. She denies any tooth pain, vomiting, or headache, but reports sinus pressure pain, dizziness, and a runny nose. She has not noticed any changes in hearing, ear drainage, or colored mucus. She has been managing the pain with Tylenol, which has provided some relief. She recently underwent a colonoscopy, which revealed multiple polyps, but was otherwise unremarkable. She has a history of seasonal allergies, for which she takes Zyrtec. Home temperature 99.8.         Relevant past medical, surgical, family and social history reviewed and updated as indicated. Interim medical history since our last visit reviewed. Allergies and medications reviewed and updated. Outpatient Medications Prior to Visit  Medication Sig Dispense Refill   amLODipine (NORVASC) 5 MG tablet Take 1 tablet (5 mg total) by mouth daily. 90 tablet 3   Cholecalciferol  (VITAMIN D) 50 MCG (2000 UT) CAPS Take 1 capsule (2,000 Units total) by mouth daily. 90 capsule 3   cyanocobalamin (VITAMIN B12) 1000 MCG/ML injection Inject 1 mL (1,000 mcg total) into the skin every 30 (thirty) days. 10 mL 1   ferrous sulfate (SLOW RELEASE IRON) 160 (50 Fe) MG TBCR SR tablet Take 1 tablet (160 mg total) by mouth 2 (two) times a week. 30 tablet 3   folic acid (FOLVITE) 800 MCG tablet Take 1 tablet (800 mcg total) by mouth daily.     magnesium chloride (SLOW-MAG) 64 MG TBEC SR tablet Take 2 tablets by mouth 2 (two) times daily.     pantoprazole (PROTONIX) 40 MG tablet Take 40 mg by mouth daily.     potassium chloride SA (KLOR-CON M) 20 MEQ tablet Take 20 mEq by mouth 2 (two) times daily.     predniSONE (DELTASONE) 5 MG tablet Take 1 tablet (5 mg total) by mouth daily with breakfast. 90 tablet 1   propranolol (INDERAL) 20 MG tablet Take 1 tablet (20 mg total) by mouth 2 (two) times daily. 60 tablet 3   SYRINGE/NEEDLE, DISP, 1 ML 23G X 1" 1 ML MISC Use as directed 8 each 0   No facility-administered medications prior to visit.     Per HPI unless specifically indicated in ROS section below Review of Systems  Objective:  BP (!) 160/74   Pulse 63   Temp 97.9 F (36.6 C) (Oral)  Ht 4\' 11"  (1.499 m)   Wt 108 lb 4 oz (49.1 kg)   SpO2 97%   BMI 21.86 kg/m   Wt Readings from Last 3 Encounters:  07/30/23 108 lb 4 oz (49.1 kg)  07/28/23 108 lb 9.6 oz (49.3 kg)  06/15/23 107 lb 6.4 oz (48.7 kg)      Physical Exam   on repeat BP check - 160s/80  HEENT: normocephalic atraumatic. Throat clear, no redness or white spots. Left ear canal clear, tympanic membrane pearly gray. Right ear canal clear, tympanic membrane pearly gray. Mild discharge in right nasal passage with nasal mucosal edema. Pupils equally round and reactive to light and accommodation, extraocular movements intact. No pain on percussion of sinuses. No swelling or mass at angle of jaw or parotids bilaterally. Mild  right anterior cervical lymphadenopathy.  Pulm: CTAB, no crackles or wheezing Card: nl S1, S2, no murmurs rubs or gallops SKIN: irritated seborrheic keratosis present to right cheek, covered with bandaid.         Results   Procedure: Strep throat swab Description: Strep swab was negative  DIAGNOSTIC COLONOSCOPY  07/2023 12 polyps removed, patent end-to-side ileo-colonic anastomosis, rpt 1 yr (Vanga)        Results for orders placed or performed in visit on 07/30/23  POCT rapid strep A  Result Value Ref Range   Rapid Strep A Screen Negative Negative    Assessment & Plan:      Upper Respiratory Infection Presents with right-sided ear pain, sore throat, and sinus pressure for 4 days. No signs of strep throat on examination or strep swab. Mild right anterior cervical lymphadenopathy noted. -Start Amoxicillin 875mg  BID for 10 days for possible sinus infection. treat aggressively given prednisone use.  -Continue Tylenol as needed for pain. -Return if symptoms do not improve.  Hypertension Elevated blood pressure in office, but patient reports normal readings at home. Possible white coat hypertension. -Continue current antihypertensive regimen of amlodipine 5mg  daily. -Monitor blood pressure at home and record in a log. -Return in 6 weeks for hypertension follow-up.  Crohn's Disease Recent colonoscopy with multiple polyps, but overall satisfactory results. -Return for repeat colonoscopy in 1 year as recommended by gastroenterologist.  Adrenal Insufficiency Managed by endocrinologist, Dr. Fransico Him. On low dose Prednisone 5mg  daily. -Continue current management with endocrinologist.      Problem List Items Addressed This Visit     Essential hypertension    RTC 6 wks HTN f/u visit with BP cuff to compare. ?prednisone effect as worse control noted since starting prednisone.  BP log provided to monitor at home.  Consider ACEI/ARB if further antihypertensive needed, avoid diuretic  and aldosterone antagonist in adrenal insufficiency.       Crohn's disease (HCC)   Neck pain on right side - Primary    Anticipate R cervical lymphadenopathy causing pain with component of sinusitis.  Ear exam reassuring.  Treat aggressively with amoxicillin 875mg  bid x 10 days.  Update if not improving with treatment.       Relevant Orders   POCT rapid strep A (Completed)   Adrenal insufficiency (HCC)   Other Visit Diagnoses     Encounter for immunization       Relevant Orders   Flu Vaccine Trivalent High Dose (Fluad) (Completed)        Meds ordered this encounter  Medications   amoxicillin (AMOXIL) 875 MG tablet    Sig: Take 1 tablet (875 mg total) by mouth 2 (two) times daily for 10  days.    Dispense:  20 tablet    Refill:  0    Orders Placed This Encounter  Procedures   Flu Vaccine Trivalent High Dose (Fluad)   POCT rapid strep A    Patient Instructions  Flu shot today  Strep swab today - negative. I would like to put you on an antibiotic course-take amoxicillin twice daily for 10 days.  Return in 1 to 2 months for hypertension follow-up visit, bring in your blood pressure cuff to that visit to compare to our readings.  Continue monitoring blood pressures at home with blood pressure log provided, let us know consistently over 140/90.  Follow up plan: Return if symptoms worsen or fail to improve.  Eustaquio Boyden, MD

## 2023-07-30 NOTE — Progress Notes (Deleted)
Ph: [phone] Fax: [phone]   Patient ID: Michelle Vasquez, female    DOB: Nov 27, 1950, 72 y.o.   MRN: [id number]  This visit was conducted in person.  There were no vitals taken for this visit.   CC: *** Subjective:   Discussed the use of AI scribe software for clinical note transcription with the patient, who gave verbal consent to proceed.  History of Present Illness             ***      Relevant past medical, surgical, family and social history reviewed and updated as indicated. Interim medical history since our last visit reviewed. Allergies and medications reviewed and updated. Outpatient Medications Prior to Visit  Medication Sig Dispense Refill   amLODipine (NORVASC) 5 MG tablet Take 1 tablet (5 mg total) by mouth daily. 90 tablet 3   Cholecalciferol (VITAMIN D) 50 MCG (2000 UT) CAPS Take 1 capsule (2,000 Units total) by mouth daily. 90 capsule 3   cyanocobalamin (VITAMIN B12) 1000 MCG/ML injection Inject 1 mL (1,000 mcg total) into the skin every 30 (thirty) days. 10 mL 1   ferrous sulfate (SLOW RELEASE IRON) 160 (50 Fe) MG TBCR SR tablet Take 1 tablet (160 mg total) by mouth 2 (two) times a week. 30 tablet 3   folic acid (FOLVITE) 800 MCG tablet Take 1 tablet (800 mcg total) by mouth daily.     magnesium chloride (SLOW-MAG) 64 MG TBEC SR tablet Take 2 tablets by mouth 2 (two) times daily.     pantoprazole (PROTONIX) 40 MG tablet Take 40 mg by mouth daily.     potassium chloride SA (KLOR-CON M) 20 MEQ tablet Take 20 mEq by mouth 2 (two) times daily. (Patient not taking: Reported on 06/15/2023)     predniSONE (DELTASONE) 5 MG tablet Take 1 tablet (5 mg total) by mouth daily with breakfast. 90 tablet 1   propranolol (INDERAL) 20 MG tablet Take 1 tablet (20 mg total) by mouth 2 (two) times daily. 60 tablet 3   SYRINGE/NEEDLE, DISP, 1 ML 23G X 1" 1 ML MISC Use as directed 8 each 0   No facility-administered medications prior to visit.     Per HPI unless  specifically indicated in ROS section below Review of Systems  Objective:  There were no vitals taken for this visit.  Wt Readings from Last 3 Encounters:  07/28/23 108 lb 9.6 oz (49.3 kg)  06/15/23 107 lb 6.4 oz (48.7 kg)  06/11/23 108 lb 6.4 oz (49.2 kg)      Physical Exam           Physical Exam    Results           Results for orders placed or performed in visit on 05/26/23  Magnesium  Result Value Ref Range   Magnesium 1.5 1.5 - 2.5 mg/dL  VITAMIN D 25 Hydroxy (Vit-D Deficiency, Fractures)  Result Value Ref Range   VITD 23.34 (L) 30.00 - 100.00 ng/mL  Vitamin B12  Result Value Ref Range   Vitamin B-12 215 211 - 911 pg/mL  T3  Result Value Ref Range   T3, Total 88 76 - 181 ng/dL  T4, free  Result Value Ref Range   Free T4 0.74 0.60 - 1.60 ng/dL  TSH  Result Value Ref Range   TSH 15.33 (H) 0.35 - 5.50 uIU/mL  Comprehensive metabolic panel  Result Value Ref Range   Sodium 140 135 - 145 mEq/L   Potassium 4.2  3.5 - 5.1 mEq/L   Chloride 103 96 - 112 mEq/L   CO2 28 19 - 32 mEq/L   Glucose, Bld 73 70 - 99 mg/dL   BUN 17 6 - 23 mg/dL   Creatinine, Ser 2.95 0.40 - 1.20 mg/dL   Total Bilirubin 0.4 0.2 - 1.2 mg/dL   Alkaline Phosphatase 80 39 - 117 U/L   AST 31 0 - 37 U/L   ALT 38 (H) 0 - 35 U/L   Total Protein 7.3 6.0 - 8.3 g/dL   Albumin 4.2 3.5 - 5.2 g/dL   GFR 18.84 >16.60 mL/min   Calcium 9.4 8.4 - 10.5 mg/dL    Assessment & Plan:   Assessment and Plan               Problem List Items Addressed This Visit   None    No orders of the defined types were placed in this encounter.   No orders of the defined types were placed in this encounter.   There are no Patient Instructions on file for this visit.  Follow up plan: No follow-ups on file.  Eustaquio Boyden, MD

## 2023-07-30 NOTE — Assessment & Plan Note (Signed)
Anticipate R cervical lymphadenopathy causing pain with component of sinusitis.  Ear exam reassuring.  Treat aggressively with amoxicillin 875mg  bid x 10 days.  Update if not improving with treatment.

## 2023-08-01 ENCOUNTER — Encounter: Payer: Self-pay | Admitting: Family Medicine

## 2023-08-03 ENCOUNTER — Encounter: Payer: Self-pay | Admitting: Gastroenterology

## 2023-08-03 ENCOUNTER — Telehealth: Payer: Self-pay

## 2023-08-03 DIAGNOSIS — K50918 Crohn's disease, unspecified, with other complication: Secondary | ICD-10-CM

## 2023-08-03 NOTE — Telephone Encounter (Signed)
-----   Message from [facility] sent at 08/03/2023 11:48 AM EDT ----- The pathology results from upper endoscopy came back normal.  The pathology results from colonoscopy shows that her colitis is inactive.  I could not evaluate small intestine because of the stricture at the anastomosis where she had previous surgery.  Therefore, recommend MR enterography and check fecal calprotectin levels to assess if there is any inflammation in her small intestine secondary to Crohn's disease.  This will help to determine the treatment of Crohn's disease  She did have several precancerous polyps that were removed.  She needs surveillance colonoscopy in 1 year  Rohini Vanga

## 2023-08-03 NOTE — Telephone Encounter (Signed)
Called patient and patient verbalized understanding of results. She is okay with getting the MRI done and stool test. Called and got patient scheduled for 08/09/2023 [facility] medical mall at 8:30am for a 9:00am scan. Nothing to eat or drink 4 hours before. Gave patient this information and gave her the hours of our lab

## 2023-08-05 ENCOUNTER — Encounter: Payer: Self-pay | Admitting: Family Medicine

## 2023-08-05 ENCOUNTER — Other Ambulatory Visit: Payer: Self-pay | Admitting: Family Medicine

## 2023-08-07 NOTE — Anesthesia Postprocedure Evaluation (Signed)
Anesthesia Post Note  Patient: Michelle Vasquez  Procedure(s) Performed: COLONOSCOPY WITH PROPOFOL ESOPHAGOGASTRODUODENOSCOPY (EGD) WITH PROPOFOL BIOPSY POLYPECTOMY INTESTINAL  Patient location during evaluation: Endoscopy Anesthesia Type: General Level of consciousness: awake and alert Pain management: pain level controlled Vital Signs Assessment: post-procedure vital signs reviewed and stable Respiratory status: spontaneous breathing, nonlabored ventilation, respiratory function stable and patient connected to nasal cannula oxygen Cardiovascular status: blood pressure returned to baseline and stable Postop Assessment: no apparent nausea or vomiting Anesthetic complications: no   No notable events documented.   Last Vitals:  Vitals:   07/28/23 1027 07/28/23 1212  BP: (!) 158/70 112/86  Pulse: 73   Resp: 20   Temp: (!) 36.2 C   SpO2: 100%     Last Pain:  Vitals:   07/28/23 1212  TempSrc:   PainSc: 0-No pain                 Lenard Simmer

## 2023-08-09 ENCOUNTER — Ambulatory Visit
Admission: RE | Admit: 2023-08-09 | Discharge: 2023-08-09 | Disposition: A | Discharge status: Home / Self Care | Payer: HMO | Source: Ambulatory Visit | Attending: Gastroenterology | Admitting: Gastroenterology

## 2023-08-09 ENCOUNTER — Ambulatory Visit
Admission: RE | Admit: 2023-08-09 | Discharge: 2023-08-09 | Disposition: A | Discharge status: Home / Self Care | Payer: HMO | Source: Ambulatory Visit | Attending: Gastroenterology

## 2023-08-09 ENCOUNTER — Telehealth: Payer: Self-pay

## 2023-08-09 DIAGNOSIS — K5710 Diverticulosis of small intestine without perforation or abscess without bleeding: Secondary | ICD-10-CM | POA: Diagnosis not present

## 2023-08-09 DIAGNOSIS — K5090 Crohn's disease, unspecified, without complications: Secondary | ICD-10-CM | POA: Diagnosis not present

## 2023-08-09 DIAGNOSIS — K50918 Crohn's disease, unspecified, with other complication: Secondary | ICD-10-CM

## 2023-08-09 DIAGNOSIS — Z90710 Acquired absence of both cervix and uterus: Secondary | ICD-10-CM | POA: Diagnosis not present

## 2023-08-09 MED ORDER — GADOBUTROL 1 MMOL/ML IV SOLN
4.0000 mL | Freq: Once | INTRAVENOUS | Status: AC | PRN
  Administered 2023-08-09: 4 mL via INTRAVENOUS

## 2023-08-09 NOTE — Telephone Encounter (Signed)
-----   Message from [facility] sent at 08/09/2023  3:39 PM EDT ----- Michelle Vasquez  Please inform patient that MRI of the small bowel revealed only a mild inflammation in the end of the small intestine, there was no evidence of any stricture or blockage or any other complication secondary to Crohn's disease.  Overall, these findings suggest that she may have mild active Crohn's disease in her small intestine.  Please remind her about stool specimen for fecal calprotectin levels  Rohini Vanga

## 2023-08-09 NOTE — Telephone Encounter (Signed)
Called and patient and patient verbalized understanding of results

## 2023-08-10 NOTE — Telephone Encounter (Signed)
Please call to schedule B12 shot nurse visit x1.

## 2023-08-12 ENCOUNTER — Ambulatory Visit: Payer: HMO

## 2023-08-12 DIAGNOSIS — K50918 Crohn's disease, unspecified, with other complication: Secondary | ICD-10-CM | POA: Diagnosis not present

## 2023-08-12 DIAGNOSIS — E538 Deficiency of other specified B group vitamins: Secondary | ICD-10-CM | POA: Diagnosis not present

## 2023-08-12 MED ORDER — CYANOCOBALAMIN 1000 MCG/ML IJ SOLN
1000.0000 ug | Freq: Once | INTRAMUSCULAR | Status: AC
Start: 2023-08-12 — End: 2023-08-12
  Administered 2023-08-12: 1000 ug via INTRAMUSCULAR

## 2023-08-12 NOTE — Progress Notes (Signed)
Per orders of Dr. Eustaquio Boyden, injection of B-12 given by Wilburn Cornelia in right deltoid. Patient tolerated injection well. Patient will continue future injections at home.

## 2023-08-14 LAB — CALPROTECTIN, FECAL: Calprotectin, Fecal: 96 ug/g (ref 0–120)

## 2023-08-16 ENCOUNTER — Encounter: Payer: Self-pay | Admitting: Gastroenterology

## 2023-08-16 ENCOUNTER — Other Ambulatory Visit: Payer: Self-pay | Admitting: "Endocrinology

## 2023-08-30 ENCOUNTER — Ambulatory Visit
Admission: RE | Admit: 2023-08-30 | Discharge: 2023-08-30 | Disposition: A | Discharge status: Home / Self Care | Payer: HMO | Source: Ambulatory Visit | Attending: Family Medicine | Admitting: Family Medicine

## 2023-08-30 DIAGNOSIS — Z7952 Long term (current) use of systemic steroids: Secondary | ICD-10-CM | POA: Diagnosis not present

## 2023-08-30 DIAGNOSIS — Z780 Asymptomatic menopausal state: Secondary | ICD-10-CM | POA: Diagnosis not present

## 2023-08-30 DIAGNOSIS — M8588 Other specified disorders of bone density and structure, other site: Secondary | ICD-10-CM | POA: Insufficient documentation

## 2023-08-30 DIAGNOSIS — M810 Age-related osteoporosis without current pathological fracture: Secondary | ICD-10-CM | POA: Diagnosis not present

## 2023-08-30 DIAGNOSIS — Z1231 Encounter for screening mammogram for malignant neoplasm of breast: Secondary | ICD-10-CM | POA: Insufficient documentation

## 2023-09-04 ENCOUNTER — Encounter: Payer: Self-pay | Admitting: Family Medicine

## 2023-09-10 ENCOUNTER — Ambulatory Visit (INDEPENDENT_AMBULATORY_CARE_PROVIDER_SITE_OTHER): Payer: HMO | Admitting: Family Medicine

## 2023-09-10 ENCOUNTER — Encounter: Payer: Self-pay | Admitting: Family Medicine

## 2023-09-10 ENCOUNTER — Telehealth: Payer: Self-pay | Admitting: Family Medicine

## 2023-09-10 VITALS — BP 136/76 | HR 70 | Temp 97.9°F | Ht 59.0 in | Wt 113.5 lb

## 2023-09-10 DIAGNOSIS — M818 Other osteoporosis without current pathological fracture: Secondary | ICD-10-CM

## 2023-09-10 DIAGNOSIS — I10 Essential (primary) hypertension: Secondary | ICD-10-CM | POA: Diagnosis not present

## 2023-09-10 DIAGNOSIS — E2740 Unspecified adrenocortical insufficiency: Secondary | ICD-10-CM

## 2023-09-10 MED ORDER — DENOSUMAB 60 MG/ML ~~LOC~~ SOSY: 60.0000 mg | PREFILLED_SYRINGE | Freq: Once | SUBCUTANEOUS | Status: AC

## 2023-09-10 MED ORDER — CALCIUM CARBONATE ANTACID 500 MG PO CHEW: 1.0000 | CHEWABLE_TABLET | Freq: Every day | ORAL | Status: DC

## 2023-09-10 NOTE — Patient Instructions (Addendum)
Take amlodipine 5mg  in the mornings with first propranolol dose, take 2nd propranolol in evenings. Continue monitoring blood pressures at home, let me know if consistently >140/90.   Continue regular weight bearing exercise.   Start chewable calcium supplement daily.   Try to get most or all of your calcium from your food--aim for 1200 mg/day for women over 50 and men over 70.  To figure out dietary calcium: 300 mg/day from all non dairy foods plus 300 mg per cup of milk, other dairy, or fortified juice. Non dairy foods that contain calcium:  Kale, oranges, sardines, oatmeal, soy milk/soybeans, salmon, white beans, dried figs, turnip greens, almonds, broccoli, tofu.   We will price out Prolia shots vs Reclast infusions. Handouts provided today.

## 2023-09-10 NOTE — Telephone Encounter (Signed)
Order started  for auth team.

## 2023-09-10 NOTE — Telephone Encounter (Signed)
Can we price out Prolia for patient? Thank you.

## 2023-09-10 NOTE — Assessment & Plan Note (Addendum)
OP on latest DEXA earlier this month at spine and forearm.  ?prednisone related.  Discussed calcium, vit D intake, as well as importance of regular weight bearing exercise.  Discussed treatment options - avoid oral bisphosphonate in Crohn's disease. Discussed Prolia vs Reclast, reviewed risk of atypical hip fractures, bone disease at jaw. We will price out Prolia for her to decide.  She will need calcium, kidney function and vitamin D levels rechecked prior to treatment.

## 2023-09-10 NOTE — Progress Notes (Signed)
Ph: [phone] Fax: [phone]   Patient ID: Michelle Vasquez, female    DOB: [birth year], 72 y.o.   MRN: [id number]  This visit was conducted in person.  BP 136/76   Pulse 70   Temp 97.9 F (36.6 C) (Oral)   Ht 4\' 11"  (1.499 m)   Wt 113 lb 8 oz (51.5 kg)   SpO2 98%   BMI 22.92 kg/m   150/68 on recheck   CC: 6 wk HTN f/u visit  Subjective:   HPI: Michelle Vasquez is a 72 y.o. female presenting on 09/10/2023 for Medical Management of Chronic Issues (Here for 6 wk HTN f/u. )   HTN - Compliant with current antihypertensive regimen of amlodipine 5mg  nightly, propranolol 20mg  BID. Does check blood pressures at home and brings BP log which was reviewed: 130-140s/60-70s I am, 110-120/60s in pm. No low blood pressure readings or symptoms of dizziness/syncope. Denies HA, vision changes, CP/tightness, SOB, leg swelling.   Adrenal insufficiency followed by endocrinology Dr Fransico Him on low dose prednisone 5mg  daily   DEXA 08/2023 - T -2.9 both at spine and L forearm Discussed OP. Continues vit D3 2000 units daily. Not on calcium supplements. Dietary calcium - limits dairy products but eats good vegetables. She walks dog 4-5 daily 1-2 blocks.  No planned dental work.      Relevant past medical, surgical, family and social history reviewed and updated as indicated. Interim medical history since our last visit reviewed. Allergies and medications reviewed and updated. Outpatient Medications Prior to Visit  Medication Sig Dispense Refill   amLODipine (NORVASC) 5 MG tablet Take 1 tablet (5 mg total) by mouth daily. 90 tablet 3   Cholecalciferol (VITAMIN D) 50 MCG (2000 UT) CAPS Take 1 capsule (2,000 Units total) by mouth daily. 90 capsule 3   cyanocobalamin (VITAMIN B12) 1000 MCG/ML injection Inject 1 mL (1,000 mcg total) into the skin every 30 (thirty) days. 10 mL 1   folic acid (FOLVITE) 800 MCG tablet Take 1 tablet (800 mcg total) by mouth daily.     magnesium chloride (SLOW-MAG) 64 MG  TBEC SR tablet Take 2 tablets by mouth 2 (two) times daily.     pantoprazole (PROTONIX) 40 MG tablet TAKE ONE TABLET BY MOUTH ONCE DAILY 90 tablet 3   predniSONE (DELTASONE) 5 MG tablet TAKE ONE TABLET (5 MG TOTAL) BY MOUTH DAILY WITH BREAKFAST. 90 tablet 1   propranolol (INDERAL) 20 MG tablet Take 1 tablet (20 mg total) by mouth 2 (two) times daily. 60 tablet 3   SYRINGE/NEEDLE, DISP, 1 ML 23G X 1" 1 ML MISC Use as directed 8 each 0   ferrous sulfate (SLOW RELEASE IRON) 160 (50 Fe) MG TBCR SR tablet Take 1 tablet (160 mg total) by mouth 2 (two) times a week. (Patient not taking: Reported on 09/10/2023) 30 tablet 3   potassium chloride SA (KLOR-CON M) 20 MEQ tablet Take 20 mEq by mouth 2 (two) times daily.     No facility-administered medications prior to visit.     Per HPI unless specifically indicated in ROS section below Review of Systems  Objective:  BP 136/76   Pulse 70   Temp 97.9 F (36.6 C) (Oral)   Ht 4\' 11"  (1.499 m)   Wt 113 lb 8 oz (51.5 kg)   SpO2 98%   BMI 22.92 kg/m   Wt Readings from Last 3 Encounters:  09/10/23 113 lb 8 oz (51.5 kg)  07/30/23 108 lb 4 oz (49.1  kg)  07/28/23 108 lb 9.6 oz (49.3 kg)      Physical Exam Vitals and nursing note reviewed.  Constitutional:      Appearance: Normal appearance. She is not ill-appearing.  Eyes:     Extraocular Movements: Extraocular movements intact.     Conjunctiva/sclera: Conjunctivae normal.     Pupils: Pupils are equal, round, and reactive to light.  Cardiovascular:     Rate and Rhythm: Normal rate and regular rhythm.     Pulses: Normal pulses.     Heart sounds: Normal heart sounds. No murmur heard. Pulmonary:     Effort: Pulmonary effort is normal. No respiratory distress.     Breath sounds: Normal breath sounds. No wheezing or rhonchi.  Musculoskeletal:     Cervical back: Normal range of motion and neck supple. No rigidity.     Right lower leg: No edema.     Left lower leg: No edema.  Lymphadenopathy:      Cervical: No cervical adenopathy.  Skin:    General: Skin is warm and dry.     Findings: No rash.  Neurological:     Mental Status: She is alert.  Psychiatric:        Mood and Affect: Mood normal.        Behavior: Behavior normal.        Assessment & Plan:   Problem List Items Addressed This Visit     Essential hypertension - Primary    Chronic, still above goal.  Overall adequate readings at home.  Discussed changing timing of antihypertensives to amlodipine 5mg  in am and propranolol 20mg  BID.   Reassess at f/u visit.       Osteoporosis    OP on latest DEXA earlier this month at spine and forearm.  ?prednisone related.  Discussed calcium, vit D intake, as well as importance of regular weight bearing exercise.  Discussed treatment options - avoid oral bisphosphonate in Crohn's disease. Discussed Prolia vs Reclast, reviewed risk of atypical hip fractures, bone disease at jaw. We will price out Prolia for her to decide.  She will need calcium, kidney function and vitamin D levels rechecked prior to treatment.       Adrenal insufficiency (HCC)     Meds ordered this encounter  Medications   calcium carbonate (ANTACID CALCIUM) 500 MG chewable tablet    Sig: Chew 1 tablet (200 mg of elemental calcium total) by mouth daily.    No orders of the defined types were placed in this encounter.   Patient Instructions  Take amlodipine 5mg  in the mornings with first propranolol dose, take 2nd propranolol in evenings. Continue monitoring blood pressures at home, let me know if consistently >140/90.   Continue regular weight bearing exercise.   Start chewable calcium supplement daily.   Try to get most or all of your calcium from your food--aim for 1200 mg/day for women over 50 and men over 70.  To figure out dietary calcium: 300 mg/day from all non dairy foods plus 300 mg per cup of milk, other dairy, or fortified juice. Non dairy foods that contain calcium:  Kale, oranges,  sardines, oatmeal, soy milk/soybeans, salmon, white beans, dried figs, turnip greens, almonds, broccoli, tofu.   We will price out Prolia shots vs Reclast infusions. Handouts provided today.   Follow up plan: Return if symptoms worsen or fail to improve.  Eustaquio Boyden, MD

## 2023-09-10 NOTE — Addendum Note (Signed)
Addended by: Donnamarie Poag on: 09/10/2023 03:06 PM   Modules accepted: Orders

## 2023-09-10 NOTE — Assessment & Plan Note (Addendum)
Chronic, still above goal.  Overall adequate readings at home.  Discussed changing timing of antihypertensives to amlodipine 5mg  in am and propranolol 20mg  BID.   Reassess at f/u visit.

## 2023-09-13 ENCOUNTER — Telehealth: Payer: Self-pay

## 2023-09-13 NOTE — Telephone Encounter (Signed)
Prolia VOB initiated via [email or web address] 

## 2023-09-20 ENCOUNTER — Other Ambulatory Visit: Payer: Self-pay | Admitting: Family Medicine

## 2023-09-20 ENCOUNTER — Other Ambulatory Visit (HOSPITAL_COMMUNITY): Payer: Self-pay

## 2023-09-20 NOTE — Telephone Encounter (Signed)
20% coinsurance, no deductible, no prior auth. needed

## 2023-09-20 NOTE — Telephone Encounter (Signed)
Pt ready for scheduling for PROLIA on or after : 09/20/23  Out-of-pocket cost due at time of visit: $320  Primary: [employer] Prolia co-insurance: 20% Admin fee co-insurance: 0%  Secondary: --- Prolia co-insurance:  Admin fee co-insurance:   Medical Benefit Details: Date Benefits were checked: 09/20/23 Deductible: NO/ Coinsurance: 20%/ Admin Fee: 0%  Prior Auth: N/A PA# Expiration Date:   # of doses approved:  Pharmacy benefit: Copay $11.20 If patient wants fill through the pharmacy benefit please send prescription to: [employer], and include estimated need by date in rx notes. Pharmacy will ship medication directly to the office.  Patient NOT eligible for Prolia Copay Card. Copay Card can make patient's cost as little as $25. Link to apply: [email or web address]://[email or web address]  ** This summary of benefits is an estimation of the patient's out-of-pocket cost. Exact cost may very based on individual plan coverage.

## 2023-09-26 HISTORY — PX: COLONOSCOPY WITH ESOPHAGOGASTRODUODENOSCOPY (EGD): SHX5779

## 2023-09-30 ENCOUNTER — Other Ambulatory Visit: Payer: Self-pay

## 2023-09-30 DIAGNOSIS — M818 Other osteoporosis without current pathological fracture: Secondary | ICD-10-CM

## 2023-09-30 MED ORDER — DENOSUMAB 60 MG/ML ~~LOC~~ SOSY: 60.0000 mg | PREFILLED_SYRINGE | Freq: Once | SUBCUTANEOUS | 0 refills | Status: AC

## 2023-10-07 ENCOUNTER — Other Ambulatory Visit (INDEPENDENT_AMBULATORY_CARE_PROVIDER_SITE_OTHER): Payer: HMO

## 2023-10-07 DIAGNOSIS — M818 Other osteoporosis without current pathological fracture: Secondary | ICD-10-CM | POA: Diagnosis not present

## 2023-10-07 LAB — BASIC METABOLIC PANEL
BUN: 13 mg/dL (ref 6–23)
CO2: 28 meq/L (ref 19–32)
Calcium: 8.8 mg/dL (ref 8.4–10.5)
Chloride: 105 meq/L (ref 96–112)
Creatinine, Ser: 0.92 mg/dL (ref 0.40–1.20)
GFR: 61.94 mL/min (ref >60.00)
Glucose, Bld: 75 mg/dL (ref 70–99)
Potassium: 3.4 meq/L — ABNORMAL LOW (ref 3.5–5.1)
Sodium: 142 meq/L (ref 135–145)

## 2023-10-11 ENCOUNTER — Telehealth: Payer: Self-pay

## 2023-10-11 ENCOUNTER — Telehealth: Payer: Self-pay | Admitting: Family Medicine

## 2023-10-11 ENCOUNTER — Ambulatory Visit (INDEPENDENT_AMBULATORY_CARE_PROVIDER_SITE_OTHER): Payer: HMO | Admitting: Gastroenterology

## 2023-10-11 ENCOUNTER — Encounter: Payer: Self-pay | Admitting: Gastroenterology

## 2023-10-11 VITALS — BP 165/77 | HR 68 | Temp 98.3°F | Ht 59.0 in | Wt 111.5 lb

## 2023-10-11 DIAGNOSIS — K50918 Crohn's disease, unspecified, with other complication: Secondary | ICD-10-CM | POA: Diagnosis not present

## 2023-10-11 DIAGNOSIS — K50012 Crohn's disease of small intestine with intestinal obstruction: Secondary | ICD-10-CM | POA: Diagnosis not present

## 2023-10-11 DIAGNOSIS — E2749 Other adrenocortical insufficiency: Secondary | ICD-10-CM | POA: Diagnosis not present

## 2023-10-11 NOTE — Progress Notes (Signed)
Arlyss Repress, MD [address]  [address]  [place], [place] [address]  Main: [phone]  Fax: [phone]    Gastroenterology Consultation  Referring Provider:     Eustaquio Boyden, MD Primary Care Physician:  Eustaquio Boyden, MD Primary Gastroenterologist:  Dr. Arlyss Repress Reason for Consultation:   Small bowel Crohn's        HPI:   Michelle Vasquez is a 72 y.o. female referred by Dr. Eustaquio Boyden, MD  for consultation & management of small bowel Crohn's  Initial visit 10/29/2022 Michelle Vasquez is a pleasant 72 year old female with history of Crohn's disease.  She is currently maintained on mercaptopurine 50 mg daily and budesonide 3 mg daily.  Her main symptom is chronic nonbloody diarrhea, has bowel movements up to 6 times daily for which she takes Imodium as needed which leads to constipation.  Her weight has been stable, reports good appetite.  She takes B12 shots and oral iron supplements.  She wanted to switch her care to [facility] to be within [facility] health system and stay locally.  Follow-up visit 02/24/2023 Since last visit, patient is diagnosed with secondary adrenal insufficiency, started on prednisone 5 mg daily 2 weeks ago.  Her serum cortisol levels were less than 5.  She reports that her diarrhea has significantly improved and she overall feels significantly better and her appetite has improved since she underwent ACTH stimulation test.  She is currently having 3-4 soft bowel movements compared to 7-10 watery bowel movements prior to the diagnosis of adrenal insufficiency.  She does not take Imodium.  Her weight has been steady.  She continues to take potassium and magnesium supplements.  Taking budesonide 3 mg daily.  She could not undergo EGD and colonoscopy due to significant electrolyte abnormalities  Follow-up visit 06/15/2023 Michelle Vasquez is here for follow-up of Crohn's disease.  She has been doing well with regards to GI symptoms.  Taking Imodium 1  pill a day which keeps her diarrhea under control.  She is not taking budesonide or 6-MP.  Her electrolyte abnormalities have been under control.  She does have mild iron deficiency, on oral iron replacement therapy.  Follow-up visit 10/11/2023 Michelle Vasquez is here for follow-up of Crohn's disease.  She reports that for last 2 weeks, her diarrhea is under control.  She does report frequent belching.  Her weight has been stable.   Crohn's disease classification:   Age: <17 Location: Ileal Behavior: stricturing Perianal:no   IBD diagnosis: small bowel Crohn's at age 89   Disease course: Small bowel involving terminal ileum Crohn's at age 20.  Underwent terminal ileum resection in 1981.  She had history of intermittent partial small bowel obstructions.  She has been maintained on low-dose 6-MP 50 mg daily since 2006.  She was found to have postop recurrence of Crohn's disease at the ilio colic anastomosis with narrowing.  Since 2021, patient is also maintained on budesonide 3 mg daily.  Her symptoms are not in remission.  Her symptoms include nonbloody diarrhea, up to 6 times daily associated with some abdominal bloating and pain.  She has chronic iron and B12 deficiency anemia, takes oral iron as well as B12 injections.  Last colonoscopy in 12/2019 revealed narrowing and ulceration of the ileocolonic anastomosis.  MR enterography on 09/09/2023 revealed mild mucosal thickening and hyperenhancement of the remnant terminal ileum consistent with mild active inflammation.  There was no evidence of overt stricture or obstruction.  Fecal calprotectin levels  were normal   Extra intestinal manifestations: None   IBD surgical history: Terminal ileal resection in 1981   Imaging:   MRE none CTE 11/25/2020 IMPRESSION: 1. Mild-to-moderate wall thickening and mild mucosal hyperenhancement in the terminal ileum, mildly worsened since 12/30/2018 CT enterography study, compatible with active Crohn disease. No  evidence of bowel obstruction, fistula or abscess. No additional sites of active bowel inflammation. 2. Small hiatal hernia. 3. Aortic Atherosclerosis   SBFT none   Procedures: EGD and colonoscopy 10-24 - Normal duodenal bulb and second portion of the duodenum. Biopsied. - Non- bleeding duodenal diverticulum. - Normal stomach. Biopsied. - Esophagogastric landmarks identified. - Normal gastroesophageal junction and esophagus. - Small hiatal hernia.  - Patent end- to- side ileo- colonic anastomosis, characterized by healthy appearing mucosa. Biopsied. - Normal mucosa in the left colon and in the right colon. Biopsied. - Two 4 to 5 mm polyps in the transverse colon, removed with a cold snare. Resected and retrieved. - Three 5 to 9 mm polyps in the descending colon, removed with a cold snare. Resected and retrieved. - One small polyp in the descending colon, removed with a cold biopsy forceps. Resected and retrieved. - Six 3 to 9 mm polyps in the sigmoid colon, removed with a cold snare. Resected and retrieved. Hemostatic spray applied. - Non- bleeding external hemorrhoids.   1. Duodenum, NOS biopsy, CBX :      - DUODENAL MUCOSA WITHOUT DIAGNOSTIC ABNORMALITY      - NEGATIVE FOR SIGNIFICANT ACTIVE INFLAMMATION, CHRONIC CHANGES, GRANULOMATA,      DYSPLASIA OR MALIGNANCY       2. Stomach, biopsy, CBX random :      - ANTRAL AND OXYNTIC MUCOSA WITH MILD REACTIVE (CHEMICAL) GASTROPATHY AND MILD      CHRONIC, INACTIVE GASTRITIS      - OXYNTIC MUCOSA WITH FOCAL PROTON PUMP INHIBITOR TYPE EFFECT/EARLY FUNDIC GLAND      POLYP LIKE CHANGE      - NEGATIVE FOR INTESTINAL METAPLASIA, DYSPLASIA OR MALIGNANCY      - SEE NOTE       3. Colon, biopsy, CBX Ileo-colic anastomosis :      - ENTERO-ENTERIC TYPE MUCOSA WITH LYMPHOID AGGREGATES AND REACTIVE CHANGES      - NEGATIVE FOR SIGNIFICANT ACTIVE INFLAMMATION, DEFINITIVE CHRONIC CHANGES,      GRANULOMATA, DYSPLASIA OR MALIGNANCY       4. Colon, biopsy, CBX  random right :      - COLONIC MUCOSA WITH FOCAL ARCHITECTURAL DISTORTION AND FEATURES SUGGESTIVE OF      MILD CHRONIC, QUIESCENT COLITIS      - NEGATIVE FOR SIGNIFICANT ACTIVE INFLAMMATION, GRANULOMATA, DYSPLASIA OR      MALIGNANCY      - SEE NOTE       5. Transverse Colon Polyp, X2 Cold snare :      - TUBULAR ADENOMA(S) (MULTIPLE FRAGMENTS)      - NEGATIVE FOR HIGH-GRADE DYSPLASIA OR MALIGNANCY       6. Descending Colon Polyp, X3 Cold snare X1 CBX :      - TUBULAR ADENOMA(S) (MULTIPLE FRAGMENTS)      - NEGATIVE FOR HIGH-GRADE DYSPLASIA OR MALIGNANCY       7. Colon, biopsy, CBX random left :      - COLONIC MUCOSA WITH RARE ARCHITECTURAL DISTORTION AND FEATURES SUGGESTIVE OF      MILD CHRONIC, QUIESCENT COLITIS      - NEGATIVE FOR SIGNIFICANT ACTIVE INFLAMMATION, GRANULOMATA, DYSPLASIA OR  MALIGNANCY      - SEE NOTE       8. Sigmoid  Colon Polyp, X6 Cold snare :      - TUBULAR ADENOMA(S) (MULTIPLE FRAGMENTS)      - NEGATIVE FOR HIGH-GRADE DYSPLASIA OR MALIGNANCY   Colonoscopy 01/15/2020 Perianal skin tags.  Not patent ileocolic anastomosis, ulcerated, biopsies performed 2 subcentimeter polyps in the colon, resected Pathology revealed tubular adenoma Ulcerated small bowel anastomotic mucosa   Upper Endoscopy 11/22/2017 - Benign-appearing esophageal stenosis. Dilated. - Esophagogastric landmarks identified. - Normal stomach. - Duodenal diverticulum. - The examination was otherwise normal. - No specimens collected.   VCE none   IBD medications:   Steroids:  5-ASA: None Immunomodulators: AZA, methotrexate, none On 6-MP 50 mg daily since 2006 TPMT status unknown Biologics: None Anti TNFs: Anti Integrins: Ustekinumab: Tofactinib: Clinical trial:     NSAIDs: None   Antiplts/Anticoagulants/Anti thrombotics: None   Past Medical History:  Diagnosis Date   Allergy    seasonal   Anxiety and depression 07/03/2011   B12 deficiency    Blood transfusion without  reported diagnosis    Central hearing loss    COVID-19 virus infection 03/03/2021   Crohn disease (HCC)    s/p colectomy on , ?bacterial overgrowth   Facial cellulitis 01/15/2023   Fatty liver 12/15/2011   History of small bowel obstruction    HTN (hypertension)    Internal hemorrhoids without mention of complication    Osteomalacia, unspecified    Perennial allergic rhinitis with seasonal variation    Squamous cell cancer of skin of left hand 11/2022   Dr Juanita Craver   Unspecified deficiency anemia    Unspecified polyarthropathy or polyarthritis, multiple sites     Past Surgical History:  Procedure Laterality Date   APPENDECTOMY     BIOPSY  07/28/2023   Procedure: BIOPSY;  Surgeon: Toney Reil, MD;  Location: [facility] ENDOSCOPY;  Service: Gastroenterology;;   COLONOSCOPY  02/25/2005   Internal hemms; crohns; narrow anast   COLONOSCOPY  02/26/2010   Prior right hemicolectomy o/w benign (Dr. Juanda Chance)   COLONOSCOPY  02/2015   benign biopsies, crohn's with stenotic stricture at anastomosis Juanda Chance)   COLONOSCOPY  12/2019   small adenomas, crohn's disease small intestine, diverticulosis, non-patent end to side ileocolonic anastomosis with inflammation and ulceration biopsied, rpt 3 yrs (Brahmbhatt)   COLONOSCOPY WITH PROPOFOL N/A 07/28/2023   12 polyps removed, patent end-to-side ileo-colonic anastomosis, rpt 1 yr (Michelle Vasquez)   DEXA  07/06/2011   normal T score -1.0 femur, spine   ESOPHAGOGASTRODUODENOSCOPY  02/25/2005   Dilated stricture   ESOPHAGOGASTRODUODENOSCOPY  10/2017   esophageal stenosis dilated, duodenal diverticuluc (Nandigam)   ESOPHAGOGASTRODUODENOSCOPY (EGD) WITH PROPOFOL N/A 07/28/2023   duodenal diverticulum, small HH (Michelle Vasquez)   HEMICOLECTOMY  1980   all small intestine, none large   OTHER SURGICAL HISTORY  1960s, 1978, 1980   ileal resection x 3 and subtotal colon resection   POLYPECTOMY  07/28/2023   Procedure: POLYPECTOMY INTESTINAL;  Surgeon: Toney Reil, MD;  Location: [facility] ENDOSCOPY;  Service: Gastroenterology;;   TOTAL ABDOMINAL HYSTERECTOMY  1975   ovaries out as well   UPPER GASTROINTESTINAL ENDOSCOPY       Current Outpatient Medications:    amLODipine (NORVASC) 5 MG tablet, Take 1 tablet (5 mg total) by mouth daily., Disp: 90 tablet, Rfl: 3   calcium carbonate (ANTACID CALCIUM) 500 MG chewable tablet, Chew 1 tablet (200 mg of elemental calcium total) by mouth daily., Disp: , Rfl:  Cholecalciferol (VITAMIN D) 50 MCG (2000 UT) CAPS, Take 1 capsule (2,000 Units total) by mouth daily., Disp: 90 capsule, Rfl: 3   cyanocobalamin (VITAMIN B12) 1000 MCG/ML injection, Inject 1 mL (1,000 mcg total) into the skin every 30 (thirty) days., Disp: 10 mL, Rfl: 1   folic acid (FOLVITE) 800 MCG tablet, Take 1 tablet (800 mcg total) by mouth daily., Disp: , Rfl:    magnesium chloride (SLOW-MAG) 64 MG TBEC SR tablet, Take 2 tablets by mouth 2 (two) times daily., Disp: , Rfl:    pantoprazole (PROTONIX) 40 MG tablet, TAKE ONE TABLET BY MOUTH ONCE DAILY, Disp: 90 tablet, Rfl: 3   predniSONE (DELTASONE) 5 MG tablet, TAKE ONE TABLET (5 MG TOTAL) BY MOUTH DAILY WITH BREAKFAST., Disp: 90 tablet, Rfl: 1   propranolol (INDERAL) 20 MG tablet, TAKE 1 TABLET BY MOUTH TWICE A DAY, Disp: 60 tablet, Rfl: 3   SYRINGE/NEEDLE, DISP, 1 ML 23G X 1" 1 ML MISC, Use as directed, Disp: 8 each, Rfl: 0  Current Facility-Administered Medications:    denosumab (PROLIA) injection 60 mg, 60 mg, Subcutaneous, Once, Eustaquio Boyden, MD   Family History  Problem Relation Age of Onset   Crohn's disease Father        colostomy   Hypertension Mother    Heart disease Mother 13   Alcohol abuse Brother    Cirrhosis Brother    Crohn's disease Sister    Crohn's disease Paternal Aunt        x 3   Crohn's disease Other        neice x 2   Colon cancer Neg Hx    Cancer Neg Hx    Diabetes Neg Hx    Rectal cancer Neg Hx    Stomach cancer Neg Hx    Esophageal cancer Neg Hx     Breast cancer Neg Hx      Social History   Tobacco Use   Smoking status: Former    Current packs/day: 0.00    Average packs/day: 0.3 packs/day for 15.0 years (3.8 ttl pk-yrs)    Types: Cigarettes    Start date: 10/27/1963    Quit date: 10/26/1978    Years since quitting: 44.9   Smokeless tobacco: Never  Vaping Use   Vaping status: Never Used  Substance Use Topics   Alcohol use: No   Drug use: No    Allergies as of 10/11/2023 - Review Complete 10/11/2023  Allergen Reaction Noted   Celexa [citalopram hydrobromide] Other (See Comments) 01/10/2014    Review of Systems:    All systems reviewed and negative except where noted in HPI.   Physical Exam:  BP (!) 165/77 (BP Location: Left Arm, Patient Position: Sitting, Cuff Size: Normal)   Pulse 68   Temp 98.3 F (36.8 C) (Oral)   Ht 4\' 11"  (1.499 m)   Wt 111 lb 8 oz (50.6 kg)   BMI 22.52 kg/m  No LMP recorded. Patient has had a hysterectomy.  General:   Alert,  Well-developed, well-nourished, pleasant and cooperative in NAD Head:  Normocephalic and atraumatic. Eyes:  Sclera clear, no icterus.   Conjunctiva pink. Ears:  Normal auditory acuity. Nose:  No deformity, discharge, or lesions. Mouth:  No deformity or lesions,oropharynx pink & moist. Neck:  Supple; no masses or thyromegaly. Lungs:  Respirations even and unlabored.  Clear throughout to auscultation.   No wheezes, crackles, or rhonchi. No acute distress. Heart:  Regular rate and rhythm; no murmurs, clicks, rubs, or gallops.  Abdomen:  Normal bowel sounds. Soft, non-tender and non-distended without masses, hepatosplenomegaly or hernias noted.  No guarding or rebound tenderness.   Rectal: Not performed Msk:  Symmetrical without gross deformities. Good, equal movement & strength bilaterally. Pulses:  Normal pulses noted. Extremities:  No clubbing or edema.  No cyanosis. Neurologic:  Alert and oriented x3;  grossly normal neurologically. Skin:  Intact without significant  lesions or rashes. No jaundice. Psych:  Alert and cooperative. Normal mood and affect.  Imaging Studies: Reviewed  Assessment and Plan:   Michelle Vasquez is a 72 y.o. pleasant Caucasian female with small bowel Crohn's diagnosed at age 7, s/p ileocecal resection in 1981, with postop recurrence at the anastomosis, previously maintained on mercaptopurine and budesonide, both have been discontinued.  Diagnosed with secondary adrenal insufficiency secondary to chronic steroid use for her Crohn's disease, maintained on low-dose prednisone.  Postop recurrence of Crohn's disease in the neoterminal ileum Today, I have discussed with patient regarding biologic therapy such as vedolizumab, ustekinumab, adalimumab and she would like to proceed with vedolizumab subcutaneous formulary.  Discussed about risks and benefits of various biologic agents including but not limited to injection site reactions, allergic reactions, infections, small risk of skin cancer and lymphoma etc Check QuantiFERON gold Check hepatitis A and B serologies   Follow up in 6 months   Arlyss Repress, MD

## 2023-10-11 NOTE — Telephone Encounter (Signed)
Lab Results  Component Value Date   NA 142 10/07/2023   CL 105 10/07/2023   K 3.4 (L) 10/07/2023   CO2 28 10/07/2023   BUN 13 10/07/2023   CREATININE 0.92 10/07/2023   GFR 61.94 10/07/2023   CALCIUM 8.8 10/07/2023   ALBUMIN 4.1 06/07/2023   GLUCOSE 75 10/07/2023  Using latest kidney function, CrCl = 43.  Ok to proceed with prolia injection. Thanks.

## 2023-10-11 NOTE — Telephone Encounter (Signed)
Submitted the starter form to [employer] for the [facility] induction followed by Entyvio injections every 2 weeks. I have also faxed the Entyvio  connect form. Emailed Sherrilyn Rist  with [facility] infusion to let her know this was coming her way

## 2023-10-11 NOTE — Telephone Encounter (Signed)
Patient has Prolia scheduled for 12/18. Her estimated creatinine clearance is 28.55. wanted to follow up and see what our next steps need to be.

## 2023-10-12 NOTE — Telephone Encounter (Signed)
Prolia has not been received. Called insurance to check on delivery. States they have not received call from patient to verify. I have called patient and let her know. Provided with number below. She will reach out to our office once she has called so we can reschedule her nurse visit.

## 2023-10-12 NOTE — Telephone Encounter (Signed)
Patient called in ready to schedule her nurse visit for prolia.

## 2023-10-13 ENCOUNTER — Ambulatory Visit: Payer: HMO

## 2023-10-13 ENCOUNTER — Encounter: Payer: Self-pay | Admitting: Gastroenterology

## 2023-10-14 ENCOUNTER — Telehealth: Payer: Self-pay

## 2023-10-14 LAB — QUANTIFERON-TB GOLD PLUS
QuantiFERON Mitogen Value: >10 [IU]/mL
QuantiFERON Nil Value: 0.03 [IU]/mL
QuantiFERON TB1 Ag Value: 0.04 [IU]/mL
QuantiFERON TB2 Ag Value: 0.04 [IU]/mL
QuantiFERON-TB Gold Plus: NEGATIVE

## 2023-10-14 LAB — HEPATITIS B SURFACE ANTIGEN: Hepatitis B Surface Ag: NEGATIVE

## 2023-10-14 LAB — HEPATITIS B SURFACE ANTIBODY,QUALITATIVE: Hep B Surface Ab, Qual: NONREACTIVE

## 2023-10-14 LAB — HEPATITIS B CORE ANTIBODY, TOTAL: Hep B Core Total Ab: NEGATIVE

## 2023-10-14 LAB — HEPATITIS A ANTIBODY, TOTAL: hep A Total Ab: POSITIVE — AB

## 2023-10-14 NOTE — Telephone Encounter (Signed)
Noted  

## 2023-10-14 NOTE — Telephone Encounter (Signed)
Copied from CRM [id number]. Topic: General - Other >> Oct 13, 2023  5:40 PM Suzette B wrote: Prolia 60mg  per ml. Quantity of one  Call from [facility]) [phone]  Reason for CRM: Notification of medication being delivered

## 2023-10-15 ENCOUNTER — Telehealth: Payer: Self-pay

## 2023-10-15 NOTE — Telephone Encounter (Signed)
-----   Message from [facility] sent at 10/15/2023 12:13 AM EST ----- Please inform patient that her test for TB and hepatitis B came back normal which is good news.  She can proceed with Entyvio once approved.  She is immune to hepatitis A.  However, recommend hepatitis B vaccine  RV

## 2023-10-15 NOTE — Telephone Encounter (Signed)
Patient verbalized understanding of results. She schedule Hep B vaccine nurse visit

## 2023-10-18 ENCOUNTER — Telehealth: Payer: Self-pay

## 2023-10-18 NOTE — Telephone Encounter (Signed)
-

## 2023-10-18 NOTE — Telephone Encounter (Signed)
Patient left a message on voicemail stating she needed to talk to Dr. Allegra Lai or her Nurse and she does not want to start the treatment for her Crohns. Patient states she has so much going on right now and have a lot of unanswered questions. She states she is doing the Prolia injections, shingles injections and hepatitis B vaccine. She states she does not want to start it till she does all this and states maybe in the spring when she comes back she can start the treatment. Emailed kari to let her know the patient does not want to start treatment at this time.

## 2023-10-18 NOTE — Telephone Encounter (Signed)
Sent a email to Sherrilyn Rist to find out Status.

## 2023-10-19 ENCOUNTER — Ambulatory Visit: Payer: HMO

## 2023-10-19 DIAGNOSIS — M818 Other osteoporosis without current pathological fracture: Secondary | ICD-10-CM

## 2023-10-19 MED ORDER — DENOSUMAB 60 MG/ML ~~LOC~~ SOSY
60.0000 mg | PREFILLED_SYRINGE | Freq: Once | SUBCUTANEOUS | Status: AC
  Administered 2023-10-19: 60 mg via SUBCUTANEOUS

## 2023-10-19 NOTE — Progress Notes (Signed)
Per orders of Dr. Olen Pel is out of office and Dr Crawford Givens who is in office, injection of prolia 60 mg given by Lewanda Rife in right arm. Patient tolerated injection well. Patient will make appointment for 6 month. This was pts first prolia injection and pt waited 15 mins after injection with no problems or concerns.

## 2023-10-19 NOTE — Telephone Encounter (Signed)
Sounds reasonable and please keep a reminder to check back on her in 3 months  Lannette Donath, MD

## 2023-10-21 ENCOUNTER — Telehealth: Payer: Self-pay | Admitting: Gastroenterology

## 2023-10-21 NOTE — Telephone Encounter (Signed)
The patient called in to wanting to know what nurse visit was she having. I inform her that she is getting her shot.

## 2023-10-28 DIAGNOSIS — D2272 Melanocytic nevi of left lower limb, including hip: Secondary | ICD-10-CM | POA: Diagnosis not present

## 2023-10-28 DIAGNOSIS — D485 Neoplasm of uncertain behavior of skin: Secondary | ICD-10-CM | POA: Diagnosis not present

## 2023-10-28 DIAGNOSIS — D2262 Melanocytic nevi of left upper limb, including shoulder: Secondary | ICD-10-CM | POA: Diagnosis not present

## 2023-10-28 DIAGNOSIS — B079 Viral wart, unspecified: Secondary | ICD-10-CM | POA: Diagnosis not present

## 2023-10-28 DIAGNOSIS — D2261 Melanocytic nevi of right upper limb, including shoulder: Secondary | ICD-10-CM | POA: Diagnosis not present

## 2023-10-28 DIAGNOSIS — L821 Other seborrheic keratosis: Secondary | ICD-10-CM | POA: Diagnosis not present

## 2023-10-28 DIAGNOSIS — D2271 Melanocytic nevi of right lower limb, including hip: Secondary | ICD-10-CM | POA: Diagnosis not present

## 2023-10-28 DIAGNOSIS — L570 Actinic keratosis: Secondary | ICD-10-CM | POA: Diagnosis not present

## 2023-10-28 DIAGNOSIS — Z85828 Personal history of other malignant neoplasm of skin: Secondary | ICD-10-CM | POA: Diagnosis not present

## 2023-10-28 DIAGNOSIS — L820 Inflamed seborrheic keratosis: Secondary | ICD-10-CM | POA: Diagnosis not present

## 2023-10-28 DIAGNOSIS — D225 Melanocytic nevi of trunk: Secondary | ICD-10-CM | POA: Diagnosis not present

## 2023-10-29 ENCOUNTER — Telehealth: Payer: Self-pay

## 2023-10-29 NOTE — Telephone Encounter (Signed)
 Put a reminder and will make a follow up appointment at nurse visit on 11/01/2023

## 2023-10-29 NOTE — Telephone Encounter (Signed)
 Submitted PA through cover my meds for the [facility]. Waiting on response from insurance company

## 2023-11-01 ENCOUNTER — Ambulatory Visit: Payer: HMO

## 2023-11-01 NOTE — Telephone Encounter (Signed)
 Insurance has approved from 10/29/2023 to 10/25/2024

## 2023-11-01 NOTE — Telephone Encounter (Signed)
 Made follow up appointment for 12/29/2023 at 2:30pm

## 2023-11-01 NOTE — Telephone Encounter (Signed)
 Called and left a message for call back

## 2023-11-02 NOTE — Telephone Encounter (Signed)
 Hep b did come in today. Got patient schedule for 11/03/2023

## 2023-11-03 ENCOUNTER — Ambulatory Visit: Payer: HMO | Admitting: Gastroenterology

## 2023-11-03 DIAGNOSIS — Z23 Encounter for immunization: Secondary | ICD-10-CM | POA: Diagnosis not present

## 2023-11-03 NOTE — Progress Notes (Signed)
 Gave Hepatitis B in left deltoid and patient tolerated the procedure okay with no side effects

## 2023-11-03 NOTE — Telephone Encounter (Signed)
 Per [employer] email  Subject: Misc Patient: Michelle Vasquez Patient DOB: 07-02-1951 Author: Glorianne Barr Primary RN: Care Team: 01 Team D Physician: Corinn Brooklyn, MD Primary Payer: Rx Advance [phone]  Per pt, please cancel referral until pt speaks with MD in March and decides if she wants to proceed with therapy.  please update MDO on pt's wishes and referral status.  Per [employer] We were going to obtain PA so If decided in March we could proceed but she would prefer we put on hold until after visit so will do that ??

## 2023-11-04 ENCOUNTER — Ambulatory Visit: Payer: HMO

## 2023-12-01 ENCOUNTER — Ambulatory Visit (INDEPENDENT_AMBULATORY_CARE_PROVIDER_SITE_OTHER): Payer: HMO | Admitting: Gastroenterology

## 2023-12-01 DIAGNOSIS — Z23 Encounter for immunization: Secondary | ICD-10-CM | POA: Diagnosis not present

## 2023-12-01 NOTE — Progress Notes (Signed)
 Patient has Hep B Vaccine in Left Deltoid and patient tolerated the procedure okay.

## 2023-12-08 ENCOUNTER — Ambulatory Visit: Payer: HMO | Admitting: Family Medicine

## 2023-12-10 ENCOUNTER — Other Ambulatory Visit: Payer: Self-pay | Admitting: "Endocrinology

## 2023-12-10 DIAGNOSIS — E2740 Unspecified adrenocortical insufficiency: Secondary | ICD-10-CM | POA: Diagnosis not present

## 2023-12-11 LAB — LIPID PANEL
Chol/HDL Ratio: 1.9 {ratio} (ref 0.0–4.4)
Cholesterol, Total: 180 mg/dL (ref 100–199)
HDL: 94 mg/dL (ref >39)
LDL Chol Calc (NIH): 73 mg/dL (ref 0–99)
Triglycerides: 67 mg/dL (ref 0–149)
VLDL Cholesterol Cal: 13 mg/dL (ref 5–40)

## 2023-12-11 LAB — COMPREHENSIVE METABOLIC PANEL
ALT: 23 [IU]/L (ref 0–32)
AST: 21 [IU]/L (ref 0–40)
Albumin: 4.2 g/dL (ref 3.8–4.8)
Alkaline Phosphatase: 70 [IU]/L (ref 44–121)
BUN/Creatinine Ratio: 12 (ref 12–28)
BUN: 10 mg/dL (ref 8–27)
Bilirubin Total: 0.5 mg/dL (ref 0.0–1.2)
CO2: 21 mmol/L (ref 20–29)
Calcium: 8.8 mg/dL (ref 8.7–10.3)
Chloride: 108 mmol/L — ABNORMAL HIGH (ref 96–106)
Creatinine, Ser: 0.84 mg/dL (ref 0.57–1.00)
Globulin, Total: 2.7 g/dL (ref 1.5–4.5)
Glucose: 71 mg/dL (ref 70–99)
Potassium: 3.6 mmol/L (ref 3.5–5.2)
Sodium: 146 mmol/L — ABNORMAL HIGH (ref 134–144)
Total Protein: 6.9 g/dL (ref 6.0–8.5)
eGFR: 73 mL/min/{1.73_m2} (ref >59)

## 2023-12-11 LAB — T4, FREE: Free T4: 1.11 ng/dL (ref 0.82–1.77)

## 2023-12-11 LAB — TSH: TSH: 6.51 u[IU]/mL — ABNORMAL HIGH (ref 0.450–4.500)

## 2023-12-13 ENCOUNTER — Encounter: Payer: Self-pay | Admitting: "Endocrinology

## 2023-12-13 ENCOUNTER — Ambulatory Visit: Payer: HMO | Admitting: "Endocrinology

## 2023-12-13 VITALS — BP 144/82 | HR 80 | Ht 59.0 in | Wt 114.8 lb

## 2023-12-13 DIAGNOSIS — E039 Hypothyroidism, unspecified: Secondary | ICD-10-CM | POA: Insufficient documentation

## 2023-12-13 DIAGNOSIS — E2740 Unspecified adrenocortical insufficiency: Secondary | ICD-10-CM | POA: Diagnosis not present

## 2023-12-13 MED ORDER — LEVOTHYROXINE SODIUM 25 MCG PO TABS: 25.0000 ug | ORAL_TABLET | Freq: Every day | ORAL | 1 refills | Status: DC

## 2023-12-13 NOTE — Progress Notes (Signed)
12/13/2023, 12:44 PM   Endocrinology follow-up note  Subjective:    Patient ID: Michelle Vasquez, female    DOB: 07/07/1951, PCP Eustaquio Boyden, MD   Past Medical History:  Diagnosis Date   Allergy    seasonal   Anxiety and depression 07/03/2011   B12 deficiency    Blood transfusion without reported diagnosis    Central hearing loss    COVID-19 virus infection 03/03/2021   Crohn disease (HCC)    s/p colectomy on , ?bacterial overgrowth   Facial cellulitis 01/15/2023   Fatty liver 12/15/2011   History of small bowel obstruction    HTN (hypertension)    Internal hemorrhoids without mention of complication    Osteomalacia, unspecified    Perennial allergic rhinitis with seasonal variation    Squamous cell cancer of skin of left hand 11/2022   Dr Juanita Craver   Unspecified deficiency anemia    Unspecified polyarthropathy or polyarthritis, multiple sites    Past Surgical History:  Procedure Laterality Date   APPENDECTOMY     BIOPSY  07/28/2023   Procedure: BIOPSY;  Surgeon: Toney Reil, MD;  Location: [facility] ENDOSCOPY;  Service: Gastroenterology;;   COLONOSCOPY  02/25/2005   Internal hemms; crohns; narrow anast   COLONOSCOPY  02/26/2010   Prior right hemicolectomy o/w benign (Dr. Juanda Chance)   COLONOSCOPY  02/2015   benign biopsies, crohn's with stenotic stricture at anastomosis Juanda Chance)   COLONOSCOPY  12/2019   small adenomas, crohn's disease small intestine, diverticulosis, non-patent end to side ileocolonic anastomosis with inflammation and ulceration biopsied, rpt 3 yrs (Brahmbhatt)   COLONOSCOPY WITH ESOPHAGOGASTRODUODENOSCOPY (EGD)  09/2023   COLONOSCOPY WITH PROPOFOL N/A 07/28/2023   Procedure: COLONOSCOPY WITH PROPOFOL;  Surgeon: Toney Reil, MD;  Location: [facility] END[facility]COPY;  Service: Gastroenterology;  Laterality: N/A;   DEXA  07/06/2011   normal T score -1.0 femur, spine   ESOPHAGOGASTRODUODENOSCOPY   02/25/2005   Dilated stricture   ESOPHAGOGASTRODUODENOSCOPY  10/2017   esophageal stenosis dilated, duodenal diverticuluc (Nandigam)   ESOPHAGOGASTRODUODENOSCOPY (EGD) WITH PROPOFOL N/A 07/28/2023   Procedure: ESOPHAGOGASTRODUODENOSCOPY (EGD) WITH PROPOFOL;  Surgeon: Toney Reil, MD;  Location: [facility] ENDOSCOPY;  Service: Gastroenterology;  Laterality: N/A;   HEMICOLECTOMY  1980   all small intestine, none large   OTHER SURGICAL HISTORY  1960s, 1978, 1980   ileal resection x 3 and subtotal colon resection   POLYPECTOMY  07/28/2023   Procedure: POLYPECTOMY INTESTINAL;  Surgeon: Toney Reil, MD;  Location: [facility] ENDOSCOPY;  Service: Gastroenterology;;   TOTAL ABDOMINAL HYSTERECTOMY  1975   ovaries out as well   UPPER GASTROINTESTINAL ENDOSCOPY     Social History   Socioeconomic History   Marital status: Divorced    Spouse name: Not on file   Number of children: 3   Years of education: Not on file   Highest education level: GED or equivalent  Occupational History   Occupation: Medical Transcription    Employer: OTHER    Comment: [employer]  Tobacco Use   Smoking status: Former    Current packs/day: 0.00    Average packs/day: 0.3 packs/day for 15.0 years (3.8 ttl pk-yrs)    Types: Cigarettes    Start date: 10/27/1963    Quit date: 10/26/1978  Years since quitting: 45.1   Smokeless tobacco: Never  Vaping Use   Vaping status: Never Used  Substance and Sexual Activity   Alcohol use: No   Drug use: No   Sexual activity: Not on file  Other Topics Concern   Not on file  Social History Narrative   Caffeine: 2 cups coffee/day   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, no regular activity   Diet: good water, fruits/vegetables occasionally   Social Drivers of Corporate investment banker Strain: Low Risk  (12/12/2023)   Overall Financial Resource Strain (CARDIA)    Difficulty of Paying Living Expenses: Not very  hard  Food Insecurity: No Food Insecurity (12/12/2023)   Hunger Vital Sign    Worried About Running Out of Food in the Last Year: Never true    Ran Out of Food in the Last Year: Never true  Transportation Needs: No Transportation Needs (12/12/2023)   PRAPARE - Administrator, Civil Service (Medical): No    Lack of Transportation (Non-Medical): No  Physical Activity: Inactive (12/12/2023)   Exercise Vital Sign    Days of Exercise per Week: 0 days    Minutes of Exercise per Session: 20 min  Stress: Stress Concern Present (12/12/2023)   [employer] of Occupational Health - Occupational Stress Questionnaire    Feeling of Stress : Very much  Social Connections: Moderately Isolated (12/12/2023)   Social Connection and Isolation Panel [NHANES]    Frequency of Communication with Friends and Family: Once a week    Frequency of Social Gatherings with Friends and Family: Never    Attends Religious Services: More than 4 times per year    Active Member of [employer] or Organizations: Yes    Attends Banker Meetings: More than 4 times per year    Marital Status: Widowed   Family History  Problem Relation Age of Onset   Crohn's disease Father        colostomy   Hypertension Mother    Heart disease Mother 36   Alcohol abuse Brother    Cirrhosis Brother    Crohn's disease Sister    Crohn's disease Paternal Aunt        x 3   Crohn's disease Other        neice x 2   Colon cancer Neg Hx    Cancer Neg Hx    Diabetes Neg Hx    Rectal cancer Neg Hx    Stomach cancer Neg Hx    Esophageal cancer Neg Hx    Breast cancer Neg Hx    Outpatient Encounter Medications as of 12/13/2023  Medication Sig   levothyroxine (SYNTHROID) 25 MCG tablet Take 1 tablet (25 mcg total) by mouth daily before breakfast.   amLODipine (NORVASC) 5 MG tablet Take 1 tablet (5 mg total) by mouth daily.   calcium carbonate (ANTACID CALCIUM) 500 MG chewable tablet Chew 1 tablet (200 mg of elemental  calcium total) by mouth daily.   Cholecalciferol (VITAMIN D) 50 MCG (2000 UT) CAPS Take 1 capsule (2,000 Units total) by mouth daily.   cyanocobalamin (VITAMIN B12) 1000 MCG/ML injection Inject 1 mL (1,000 mcg total) into the skin every 30 (thirty) days.   folic acid (FOLVITE) 800 MCG tablet Take 1 tablet (800 mcg total) by mouth daily.   magnesium chloride (SLOW-MAG) 64 MG TBEC SR tablet Take 2 tablets by mouth 2 (two) times daily.  pantoprazole (PROTONIX) 40 MG tablet TAKE ONE TABLET BY MOUTH ONCE DAILY   predniSONE (DELTASONE) 5 MG tablet TAKE ONE TABLET (5 MG TOTAL) BY MOUTH DAILY WITH BREAKFAST.   propranolol (INDERAL) 20 MG tablet TAKE 1 TABLET BY MOUTH TWICE A DAY   SYRINGE/NEEDLE, DISP, 1 ML 23G X 1" 1 ML MISC Use as directed   Facility-Administered Encounter Medications as of 12/13/2023  Medication   denosumab (PROLIA) injection 60 mg   ALLERGIES: Allergies  Allergen Reactions   Celexa [Citalopram Hydrobromide] Other (See Comments)    Racing thoughts, couldn't sleep    VACCINATION STATUS: Immunization History  Administered Date(s) Administered   Fluad Quad(high Dose 65+) 09/07/2019, 11/25/2020, 12/12/2022   Fluad Trivalent(High Dose 65+) 07/30/2023   Hepatitis B 04/23/2013, 05/01/2013   Hepatitis B, PED/ADOLESCENT 09/25/2013   Hepb-cpg 11/03/2023, 12/01/2023   Influenza Whole 07/26/2008   Influenza,inj,Quad PF,6+ Mos 08/13/2014, 09/01/2018   Influenza-Unspecified 08/04/2016, 07/30/2017   PFIZER(Purple Top)SARS-COV-2 Vaccination 06/07/2020, 06/28/2020   Pneumococcal Conjugate-13 11/09/2017   Pneumococcal Polysaccharide-23 03/21/2013, 11/24/2019   Td 08/07/2004   Tdap 06/11/2014   Zoster Recombinant(Shingrix) 08/10/2023   Zoster, Live 08/24/2016    HPI Michelle Vasquez is 73 y.o. female who presents today with a medical history as above. she is being seen in follow-up after she was seen in consultation for hypocortisolemia requested by Eustaquio Boyden, MD.  - She  reports that she was diagnosed with Crohn's disease at age 34.  She has received various modalities of treatment over the decades for it including high-dose steroids.  At times she has taken prednisone as high as 80 mg daily. More recently she was dealing with symptoms including dizziness, disequilibrium, weight loss.   -After appropriate workup including ACTH stimulation test confirmed primary adrenal insufficiency, she was started on prednisone 5 mg regular dose daily.  She presents with clinical and biochemical improvement.  She has no new complaints today.   Her previsit labs also show evidence of hypothyroidism.  She is on medications including amlodipine, budesonide, vitamin D, vitamin B12, folic acid, magnesium gluconate, mercaptopurine, Protonix, potassium supplements.   Review of Systems  Constitutional: + mildly fluctuating body weight gain, + fatigue, no subjective hyperthermia, no subjective hypothermia   Objective:       12/13/2023   10:20 AM 10/11/2023    1:40 PM 09/10/2023   10:55 AM  Vitals with BMI  Height 4\' 11"  4\' 11"  4\' 11"   Weight 114 lbs 13 oz 111 lbs 8 oz 113 lbs 8 oz  BMI 23.17 22.51 22.91  Systolic 144 165 865  Diastolic 82 77 76  Pulse 80 68 70    BP (!) 144/82   Pulse 80   Ht 4\' 11"  (1.499 m)   Wt 114 lb 12.8 oz (52.1 kg)   BMI 23.19 kg/m   Wt Readings from Last 3 Encounters:  12/13/23 114 lb 12.8 oz (52.1 kg)  10/11/23 111 lb 8 oz (50.6 kg)  09/10/23 113 lb 8 oz (51.5 kg)    Physical Exam  Constitutional:  Body mass index is 23.19 kg/m.,  not in acute distress, normal state of mind Eyes: PERRLA, EOMI, no exophthalmos ENT: moist mucous membranes, no gross thyromegaly, no gross cervical lymphadenopathy   CMP ( most recent) CMP     Component Value Date/Time   NA 146 (H) 12/10/2023 0829   K 3.6 12/10/2023 0829   CL 108 (H) 12/10/2023 0829   CO2 21 12/10/2023 0829   GLUCOSE 71 12/10/2023 0829  GLUCOSE 75 10/07/2023 0805   BUN 10  12/10/2023 0829   CREATININE 0.84 12/10/2023 0829   CREATININE 0.84 10/02/2022 1537   CALCIUM 8.8 12/10/2023 0829   PROT 6.9 12/10/2023 0829   ALBUMIN 4.2 12/10/2023 0829   AST 21 12/10/2023 0829   ALT 23 12/10/2023 0829   ALKPHOS 70 12/10/2023 0829   BILITOT 0.5 12/10/2023 0829   GFRNONAA >60 12/12/2022 0639   GFRAA >60 11/21/2019 1238     Diabetic Labs (most recent): Lab Results  Component Value Date   MICROALBUR 5.0 (H) 03/20/2022   MICROALBUR 0.8 11/21/2020   MICROALBUR 1.5 11/20/2019     Lipid Panel ( most recent) Lipid Panel     Component Value Date/Time   CHOL 180 12/10/2023 0829   TRIG 67 12/10/2023 0829   HDL 94 12/10/2023 0829   CHOLHDL 1.9 12/10/2023 0829   CHOLHDL 2 03/20/2022 1319   VLDL 13.6 03/20/2022 1319   LDLCALC 73 12/10/2023 0829   LABVLDL 13 12/10/2023 0829      Lab Results  Component Value Date   TSH 6.510 (H) 12/10/2023   TSH 4.070 06/07/2023   TSH 15.33 (H) 05/26/2023   TSH 6.52 (H) 04/20/2023   TSH 2.13 03/20/2022   TSH 3.59 01/31/2018   TSH 2.81 06/04/2014   TSH 3.29 04/09/2010   TSH 2.78 04/03/2009   TSH 2.32 08/03/2007   FREET4 1.11 12/10/2023   FREET4 1.11 06/07/2023   FREET4 0.74 05/26/2023    ACTH stimulation test on February 05, 2023  Latest Reference Range & Units 01/26/23 09:49  Cortisol, Base ug/dL <1.1  Cortisol, 30 Min ug/dL 3.8  Cortisol, 60 Min ug/dL 5.0    Assessment & Plan:   1. Adrenal insufficiency (HCC) 2.  Hypothyroidism Her recent workup confirmed primary adrenal insufficiency  likely related to her chronic steroids exposure.  I reviewed her new and existing labs and clinically evaluated the patient. She is responding to prednisone 5 mg p.o. daily.  She is advised to continue with this intervention for at least glucocorticoid replacement.  She would not need specific mineralocorticoid replacement at this time.   She will be reassessed with CMP next visit.  She is advised to wear medical alert indicating her  diagnosis of adrenal insufficiency.    Sick day steroids rule was discussed with her.   -Her blood pressure is above target today at 144/82.  She is advised to stay on amlodipine, as well as beta-blocker. She has a new diagnosis of mild hypothyroidism. She would benefit from early intervention with low-dose thyroid hormone.  I discussed and initiated levothyroxine 25 mcg p.o. daily before breakfast   - We discussed about the correct intake of her thyroid hormone, on empty stomach at fasting, with water, separated by at least 30 minutes from breakfast and other medications,  and separated by more than 4 hours from calcium, iron, multivitamins, acid reflux medications (PPIs). -Patient is made aware of the fact that thyroid hormone replacement is needed for life, dose to be adjusted by periodic monitoring of thyroid function tests.   She is on Prolia treatment arranged at her PCP office.  She is due for her next bone density in November 2026.  She is advised to continue Prolia intervention without interruption.     - she is advised to maintain close follow up with Eustaquio Boyden, MD for primary care needs.   I spent  25  minutes in the care of the patient today including review of labs  from Thyroid Function, CMP, and other relevant labs ; imaging/biopsy records (current and previous including abstractions from other facilities); face-to-face time discussing  her lab results and symptoms, medications doses, her options of short and long term treatment based on the latest standards of care / guidelines;   and documenting the encounter.  Michelle Vasquez  participated in the discussions, expressed understanding, and voiced agreement with the above plans.  All questions were answered to her satisfaction. she is encouraged to contact clinic should she have any questions or concerns prior to her return visit.   Follow up plan: Return in about 6 months (around 06/11/2024) for F/U with Pre-visit  Labs.   Marquis Lunch, MD [facility] [address] [place], [place] [address] Phone: [phone]  Fax: [phone]     12/13/2023, 12:44 PM  This note was partially dictated with voice recognition software. Similar sounding words can be transcribed inadequately or may not  be corrected upon review.

## 2023-12-14 ENCOUNTER — Encounter: Payer: Self-pay | Admitting: Family Medicine

## 2023-12-14 ENCOUNTER — Ambulatory Visit (INDEPENDENT_AMBULATORY_CARE_PROVIDER_SITE_OTHER): Payer: HMO | Admitting: Family Medicine

## 2023-12-14 VITALS — BP 156/72 | HR 68 | Temp 98.2°F | Ht 59.0 in | Wt 115.0 lb

## 2023-12-14 DIAGNOSIS — E2740 Unspecified adrenocortical insufficiency: Secondary | ICD-10-CM

## 2023-12-14 DIAGNOSIS — E039 Hypothyroidism, unspecified: Secondary | ICD-10-CM | POA: Diagnosis not present

## 2023-12-14 DIAGNOSIS — I10 Essential (primary) hypertension: Secondary | ICD-10-CM | POA: Diagnosis not present

## 2023-12-14 DIAGNOSIS — Z7189 Other specified counseling: Secondary | ICD-10-CM

## 2023-12-14 DIAGNOSIS — K50118 Crohn's disease of large intestine with other complication: Secondary | ICD-10-CM

## 2023-12-14 MED ORDER — AMLODIPINE BESYLATE 10 MG PO TABS: 10.0000 mg | ORAL_TABLET | Freq: Every day | ORAL | 3 refills | Status: DC

## 2023-12-14 NOTE — Progress Notes (Unsigned)
Ph: [phone] Fax: [phone]   Patient ID: Michelle Vasquez, female    DOB: [birth year], 73 y.o.   MRN: [id number]  This visit was conducted in person.  BP (!) 156/72   Pulse 68   Temp 98.2 F (36.8 C) (Oral)   Ht 4\' 11"  (1.499 m)   Wt 115 lb (52.2 kg)   SpO2 98%   BMI 23.23 kg/m   BP Readings from Last 3 Encounters:  12/14/23 (!) 156/72  12/13/23 (!) 144/82  10/11/23 (!) 165/77   160/80 on retesting  CC: 6 mo HTN f/u visit  Subjective:   HPI: Michelle Vasquez is a 73 y.o. female presenting on 12/14/2023 for Medical Management of Chronic Issues (Here for 6 mo HTN f/u. Pt brought in Health Care POA [made copy to keep] and BP log. Also, pt brought in home BP monitor to compare. Reading in office today- 167/81; 2nd reading- 164/81.)   HTN - Compliant with current antihypertensive regimen of amlodipine 5mg  daily and propranolol 20mg  bid. Has not been checking blood pressures at home. No low blood pressure readings or symptoms of dizziness/syncope. Denies HA, vision changes, CP/tightness, SOB, leg swelling.   Adrenal insufficiency followed by endocrinology Dr Fransico Him on low dose prednisone 5mg  daily. Newly diagnosed levothyroxine, now on levothyroxine  Advanced directive: brings copy but not notarized. Daughter Michelle Vasquez as well as Michelle Vasquez in [place] are HCPOA. Ok for organ donation. Does not want prolonged life support if terminal condition.      Relevant past medical, surgical, family and social history reviewed and updated as indicated. Interim medical history since our last visit reviewed. Allergies and medications reviewed and updated. Outpatient Medications Prior to Visit  Medication Sig Dispense Refill   calcium carbonate (ANTACID CALCIUM) 500 MG chewable tablet Chew 1 tablet (200 mg of elemental calcium total) by mouth daily.     Cholecalciferol (VITAMIN D) 50 MCG (2000 UT) CAPS Take 1 capsule (2,000 Units total) by mouth daily. 90 capsule 3    cyanocobalamin (VITAMIN B12) 1000 MCG/ML injection Inject 1 mL (1,000 mcg total) into the skin every 30 (thirty) days. 10 mL 1   folic acid (FOLVITE) 800 MCG tablet Take 1 tablet (800 mcg total) by mouth daily.     magnesium chloride (SLOW-MAG) 64 MG TBEC SR tablet Take 2 tablets by mouth 2 (two) times daily.     pantoprazole (PROTONIX) 40 MG tablet TAKE ONE TABLET BY MOUTH ONCE DAILY 90 tablet 3   predniSONE (DELTASONE) 5 MG tablet TAKE ONE TABLET (5 MG TOTAL) BY MOUTH DAILY WITH BREAKFAST. 90 tablet 1   propranolol (INDERAL) 20 MG tablet TAKE 1 TABLET BY MOUTH TWICE A DAY 60 tablet 3   SYRINGE/NEEDLE, DISP, 1 ML 23G X 1" 1 ML MISC Use as directed 8 each 0   amLODipine (NORVASC) 5 MG tablet Take 1 tablet (5 mg total) by mouth daily. 90 tablet 3   levothyroxine (SYNTHROID) 25 MCG tablet Take 1 tablet (25 mcg total) by mouth daily before breakfast. (Patient not taking: Reported on 12/14/2023) 90 tablet 1   Facility-Administered Medications Prior to Visit  Medication Dose Route Frequency Provider Last Rate Last Admin   denosumab (PROLIA) injection 60 mg  60 mg Subcutaneous Once Eustaquio Boyden, MD         Per HPI unless specifically indicated in ROS section below Review of Systems  Objective:  BP (!) 156/72   Pulse 68   Temp 98.2 F (36.8  C) (Oral)   Ht 4\' 11"  (1.499 m)   Wt 115 lb (52.2 kg)   SpO2 98%   BMI 23.23 kg/m   Wt Readings from Last 3 Encounters:  12/14/23 115 lb (52.2 kg)  12/13/23 114 lb 12.8 oz (52.1 kg)  10/11/23 111 lb 8 oz (50.6 kg)      Physical Exam Vitals and nursing note reviewed.  Constitutional:      Appearance: Normal appearance. She is not ill-appearing.  HENT:     Mouth/Throat:     Mouth: Mucous membranes are moist.     Pharynx: Oropharynx is clear. No oropharyngeal exudate.  Eyes:     Extraocular Movements: Extraocular movements intact.     Pupils: Pupils are equal, round, and reactive to light.  Cardiovascular:     Rate and Rhythm: Normal rate  and regular rhythm.     Pulses: Normal pulses.     Heart sounds: Normal heart sounds. No murmur heard. Pulmonary:     Effort: Pulmonary effort is normal. No respiratory distress.     Breath sounds: Normal breath sounds. No wheezing, rhonchi or rales.  Musculoskeletal:     Right lower leg: No edema.     Left lower leg: No edema.  Skin:    General: Skin is warm and dry.     Findings: No rash.  Neurological:     Mental Status: She is alert.  Psychiatric:        Mood and Affect: Mood normal.        Behavior: Behavior normal.       Results for orders placed or performed in visit on 12/10/23  Comprehensive metabolic panel   Collection Time: 12/10/23  8:29 AM  Result Value Ref Range   Glucose 71 70 - 99 mg/dL   BUN 10 8 - 27 mg/dL   Creatinine, Ser 1.61 0.57 - 1.00 mg/dL   eGFR 73 >09 UE/AVW/0.98   BUN/Creatinine Ratio 12 12 - 28   Sodium 146 (H) 134 - 144 mmol/L   Potassium 3.6 3.5 - 5.2 mmol/L   Chloride 108 (H) 96 - 106 mmol/L   CO2 21 20 - 29 mmol/L   Calcium 8.8 8.7 - 10.3 mg/dL   Total Protein 6.9 6.0 - 8.5 g/dL   Albumin 4.2 3.8 - 4.8 g/dL   Globulin, Total 2.7 1.5 - 4.5 g/dL   Bilirubin Total 0.5 0.0 - 1.2 mg/dL   Alkaline Phosphatase 70 44 - 121 IU/L   AST 21 0 - 40 IU/L   ALT 23 0 - 32 IU/L  Lipid panel   Collection Time: 12/10/23  8:29 AM  Result Value Ref Range   Cholesterol, Total 180 100 - 199 mg/dL   Triglycerides 67 0 - 149 mg/dL   HDL 94 >11 mg/dL   VLDL Cholesterol Cal 13 5 - 40 mg/dL   LDL Chol Calc (NIH) 73 0 - 99 mg/dL   Chol/HDL Ratio 1.9 0.0 - 4.4 ratio  T4, free   Collection Time: 12/10/23  8:29 AM  Result Value Ref Range   Free T4 1.11 0.82 - 1.77 ng/dL  TSH   Collection Time: 12/10/23  8:29 AM  Result Value Ref Range   TSH 6.510 (H) 0.450 - 4.500 uIU/mL    Assessment & Plan:   Problem List Items Addressed This Visit     Advanced directives, counseling/discussion (Chronic)   Advanced directive: brings copy but not notarized. Daughter  Michelle Vasquez as well as Michelle Vasquez in  [place] are HCPOA. Ok for organ donation. Does not want prolonged life support if terminal condition.       Essential hypertension - Primary   Chronic, BP remaining elevated. Will increase amlodipine to 10mg  daily - take in am, monitor for pedal edema side effect. Continue propranolol 20mg  BID.  Reviewed best strategy to check BP at home, log sheet provided.      Relevant Medications   amLODipine (NORVASC) 10 MG tablet   Crohn's disease (HCC)   Closely followed by [facility], discussing biologic agent. Appreciate their care.       Adrenal insufficiency (HCC)   Appreciate endo care, continues low dose prednisone 5mg  daily.       Hypothyroidism   New diagnosis, has been started on levothyroxine daily by endo.         Meds ordered this encounter  Medications   amLODipine (NORVASC) 10 MG tablet    Sig: Take 1 tablet (10 mg total) by mouth daily.    Dispense:  90 tablet    Refill:  3    No orders of the defined types were placed in this encounter.   Patient Instructions  BP is staying elevated - increase amlodipine to 10mg  daily in the mornings.  Continue propranolol 20mg  twice daily.  Monitor BP at home with log sheet provided, drop off readings in a few weeks  Limit salt/sodium in diet, drink plenty of water.  Check BP in a resting state  Return in 3 months for BP follow up visit   Follow up plan: Return in about 3 months (around 03/12/2024) for follow up visit.  Eustaquio Boyden, MD

## 2023-12-14 NOTE — Patient Instructions (Addendum)
BP is staying elevated - increase amlodipine to 10mg  daily in the mornings.  Continue propranolol 20mg  twice daily.  Monitor BP at home with log sheet provided, drop off readings in a few weeks  Limit salt/sodium in diet, drink plenty of water.  Check BP in a resting state  Return in 3 months for BP follow up visit

## 2023-12-15 NOTE — Assessment & Plan Note (Addendum)
New diagnosis, has been started on levothyroxine daily by endo.

## 2023-12-15 NOTE — Assessment & Plan Note (Signed)
Appreciate endo care, continues low dose prednisone 5mg  daily.

## 2023-12-15 NOTE — Assessment & Plan Note (Signed)
Closely followed by [facility], discussing biologic agent. Appreciate their care.

## 2023-12-15 NOTE — Assessment & Plan Note (Addendum)
Chronic, BP remaining elevated. Will increase amlodipine to 10mg  daily - take in am, monitor for pedal edema side effect. Continue propranolol 20mg  BID.  Reviewed best strategy to check BP at home, log sheet provided.

## 2023-12-15 NOTE — Assessment & Plan Note (Addendum)
Advanced directive: brings copy but not notarized. Daughter Christeena Krogh as well as [name] in [place] are HCPOA. Ok for organ donation. Does not want prolonged life support if terminal condition.

## 2023-12-29 ENCOUNTER — Ambulatory Visit: Payer: HMO | Admitting: Gastroenterology

## 2023-12-29 ENCOUNTER — Encounter: Payer: Self-pay | Admitting: Gastroenterology

## 2023-12-29 VITALS — BP 119/70 | HR 75 | Temp 97.6°F | Ht 59.0 in | Wt 116.1 lb

## 2023-12-29 DIAGNOSIS — K50012 Crohn's disease of small intestine with intestinal obstruction: Secondary | ICD-10-CM

## 2023-12-29 NOTE — Progress Notes (Signed)
 Arlyss Repress, MD [address]  [address]  [place], [place] [address]  Main: [phone]  Fax: [phone]    Gastroenterology Consultation  Referring Provider:     Eustaquio Boyden, MD Primary Care Physician:  Eustaquio Boyden, MD Primary Gastroenterologist:  Dr. Arlyss Repress Reason for Consultation:   Small bowel Crohn's        HPI:   Michelle Vasquez is a 73 y.o. female referred by Dr. Eustaquio Boyden, MD  for consultation & management of small bowel Crohn's  Initial visit 10/29/2022 Ms. Michelle Vasquez is a pleasant 73 year old female with history of Crohn's disease.  She is currently maintained on mercaptopurine 50 mg daily and budesonide 3 mg daily.  Her main symptom is chronic nonbloody diarrhea, has bowel movements up to 6 times daily for which she takes Imodium as needed which leads to constipation.  Her weight has been stable, reports good appetite.  She takes B12 shots and oral iron supplements.  She wanted to switch her care to [facility] to be within [facility] health system and stay locally.  Follow-up visit 02/24/2023 Since last visit, patient is diagnosed with secondary adrenal insufficiency, started on prednisone 5 mg daily 2 weeks ago.  Her serum cortisol levels were less than 5.  She reports that her diarrhea has significantly improved and she overall feels significantly better and her appetite has improved since she underwent ACTH stimulation test.  She is currently having 3-4 soft bowel movements compared to 7-10 watery bowel movements prior to the diagnosis of adrenal insufficiency.  She does not take Imodium.  Her weight has been steady.  She continues to take potassium and magnesium supplements.  Taking budesonide 3 mg daily.  She could not undergo EGD and colonoscopy due to significant electrolyte abnormalities  Follow-up visit 06/15/2023 Michelle Vasquez is here for follow-up of Crohn's disease.  She has been doing well with regards to GI symptoms.  Taking Imodium 1  pill a day which keeps her diarrhea under control.  She is not taking budesonide or 6-MP.  Her electrolyte abnormalities have been under control.  She does have mild iron deficiency, on oral iron replacement therapy.  Follow-up visit 10/11/2023 Michelle Vasquez is here for follow-up of Crohn's disease.  She reports that for last 2 weeks, her diarrhea is under control.  She does report frequent belching.  Her weight has been stable.  Follow-up visit 12/29/2023 Michelle Vasquez is here to discuss about vedolizumab treatment for her Crohn's disease.  Medication has been approved but she is hesitant regarding initiation of medication due to side effects.  She denies having any active symptoms from underlying Crohn's disease   Crohn's disease classification:   Age: <17 Location: Ileal Behavior: stricturing Perianal:no   IBD diagnosis: small bowel Crohn's at age 28   Disease course: Small bowel involving terminal ileum Crohn's at age 39.  Underwent terminal ileum resection in 1981.  She had history of intermittent partial small bowel obstructions.  She has been maintained on low-dose 6-MP 50 mg daily since 2006.  She was found to have postop recurrence of Crohn's disease at the ilio colic anastomosis with narrowing.  Since 2021, patient is also maintained on budesonide 3 mg daily.  Her symptoms are not in remission.  Her symptoms include nonbloody diarrhea, up to 6 times daily associated with some abdominal bloating and pain.  She has chronic iron and B12 deficiency anemia, takes oral iron as well as B12 injections.  Last colonoscopy  in 12/2019 revealed narrowing and ulceration of the ileocolonic anastomosis.  MR enterography on 09/09/2023 revealed mild mucosal thickening and hyperenhancement of the remnant terminal ileum consistent with mild active inflammation.  There was no evidence of overt stricture or obstruction.  Fecal calprotectin levels were normal   Extra intestinal manifestations: None   IBD surgical  history: Terminal ileal resection in 1981   Imaging:   MRE none CTE 11/25/2020 IMPRESSION: 1. Mild-to-moderate wall thickening and mild mucosal hyperenhancement in the terminal ileum, mildly worsened since 12/30/2018 CT enterography study, compatible with active Crohn disease. No evidence of bowel obstruction, fistula or abscess. No additional sites of active bowel inflammation. 2. Small hiatal hernia. 3. Aortic Atherosclerosis   SBFT none   Procedures: EGD and colonoscopy 10-24 - Normal duodenal bulb and second portion of the duodenum. Biopsied. - Non- bleeding duodenal diverticulum. - Normal stomach. Biopsied. - Esophagogastric landmarks identified. - Normal gastroesophageal junction and esophagus. - Small hiatal hernia.  - Patent end- to- side ileo- colonic anastomosis, characterized by healthy appearing mucosa. Biopsied. - Normal mucosa in the left colon and in the right colon. Biopsied. - Two 4 to 5 mm polyps in the transverse colon, removed with a cold snare. Resected and retrieved. - Three 5 to 9 mm polyps in the descending colon, removed with a cold snare. Resected and retrieved. - One small polyp in the descending colon, removed with a cold biopsy forceps. Resected and retrieved. - Six 3 to 9 mm polyps in the sigmoid colon, removed with a cold snare. Resected and retrieved. Hemostatic spray applied. - Non- bleeding external hemorrhoids.   1. Duodenum, NOS biopsy, CBX :      - DUODENAL MUCOSA WITHOUT DIAGNOSTIC ABNORMALITY      - NEGATIVE FOR SIGNIFICANT ACTIVE INFLAMMATION, CHRONIC CHANGES, GRANULOMATA,      DYSPLASIA OR MALIGNANCY       2. Stomach, biopsy, CBX random :      - ANTRAL AND OXYNTIC MUCOSA WITH MILD REACTIVE (CHEMICAL) GASTROPATHY AND MILD      CHRONIC, INACTIVE GASTRITIS      - OXYNTIC MUCOSA WITH FOCAL PROTON PUMP INHIBITOR TYPE EFFECT/EARLY FUNDIC GLAND      POLYP LIKE CHANGE      - NEGATIVE FOR INTESTINAL METAPLASIA, DYSPLASIA OR MALIGNANCY      - SEE  NOTE       3. Colon, biopsy, CBX Ileo-colic anastomosis :      - ENTERO-ENTERIC TYPE MUCOSA WITH LYMPHOID AGGREGATES AND REACTIVE CHANGES      - NEGATIVE FOR SIGNIFICANT ACTIVE INFLAMMATION, DEFINITIVE CHRONIC CHANGES,      GRANULOMATA, DYSPLASIA OR MALIGNANCY       4. Colon, biopsy, CBX random right :      - COLONIC MUCOSA WITH FOCAL ARCHITECTURAL DISTORTION AND FEATURES SUGGESTIVE OF      MILD CHRONIC, QUIESCENT COLITIS      - NEGATIVE FOR SIGNIFICANT ACTIVE INFLAMMATION, GRANULOMATA, DYSPLASIA OR      MALIGNANCY      - SEE NOTE       5. Transverse Colon Polyp, X2 Cold snare :      - TUBULAR ADENOMA(S) (MULTIPLE FRAGMENTS)      - NEGATIVE FOR HIGH-GRADE DYSPLASIA OR MALIGNANCY       6. Descending Colon Polyp, X3 Cold snare X1 CBX :      - TUBULAR ADENOMA(S) (MULTIPLE FRAGMENTS)      - NEGATIVE FOR HIGH-GRADE DYSPLASIA OR MALIGNANCY       7. Colon, biopsy,  CBX random left :      - COLONIC MUCOSA WITH RARE ARCHITECTURAL DISTORTION AND FEATURES SUGGESTIVE OF      MILD CHRONIC, QUIESCENT COLITIS      - NEGATIVE FOR SIGNIFICANT ACTIVE INFLAMMATION, GRANULOMATA, DYSPLASIA OR      MALIGNANCY      - SEE NOTE       8. Sigmoid  Colon Polyp, X6 Cold snare :      - TUBULAR ADENOMA(S) (MULTIPLE FRAGMENTS)      - NEGATIVE FOR HIGH-GRADE DYSPLASIA OR MALIGNANCY   Colonoscopy 01/15/2020 Perianal skin tags.  Not patent ileocolic anastomosis, ulcerated, biopsies performed 2 subcentimeter polyps in the colon, resected Pathology revealed tubular adenoma Ulcerated small bowel anastomotic mucosa   Upper Endoscopy 11/22/2017 - Benign-appearing esophageal stenosis. Dilated. - Esophagogastric landmarks identified. - Normal stomach. - Duodenal diverticulum. - The examination was otherwise normal. - No specimens collected.   VCE none   IBD medications:   Steroids:  5-ASA: None Immunomodulators: AZA, methotrexate, none On 6-MP 50 mg daily since 2006 TPMT status unknown Biologics:  None Anti TNFs: Anti Integrins: Ustekinumab: Tofactinib: Clinical trial:     NSAIDs: None   Antiplts/Anticoagulants/Anti thrombotics: None   Past Medical History:  Diagnosis Date   Allergy    seasonal   Anxiety and depression 07/03/2011   B12 deficiency    Blood transfusion without reported diagnosis    Central hearing loss    COVID-19 virus infection 03/03/2021   Crohn disease (HCC)    s/p colectomy on , ?bacterial overgrowth   Facial cellulitis 01/15/2023   Fatty liver 12/15/2011   History of small bowel obstruction    HTN (hypertension)    Internal hemorrhoids without mention of complication    Osteomalacia, unspecified    Perennial allergic rhinitis with seasonal variation    Squamous cell cancer of skin of left hand 11/2022   Dr Juanita Craver   Unspecified deficiency anemia    Unspecified polyarthropathy or polyarthritis, multiple sites     Past Surgical History:  Procedure Laterality Date   APPENDECTOMY     BIOPSY  07/28/2023   Procedure: BIOPSY;  Surgeon: Toney Reil, MD;  Location: [facility] ENDOSCOPY;  Service: Gastroenterology;;   COLONOSCOPY  02/25/2005   Internal hemms; crohns; narrow anast   COLONOSCOPY  02/26/2010   Prior right hemicolectomy o/w benign (Dr. Juanda Chance)   COLONOSCOPY  02/2015   benign biopsies, crohn's with stenotic stricture at anastomosis Juanda Chance)   COLONOSCOPY  12/2019   small adenomas, crohn's disease small intestine, diverticulosis, non-patent end to side ileocolonic anastomosis with inflammation and ulceration biopsied, rpt 3 yrs (Brahmbhatt)   COLONOSCOPY WITH ESOPHAGOGASTRODUODENOSCOPY (EGD)  09/2023   COLONOSCOPY WITH PROPOFOL N/A 07/28/2023   Procedure: COLONOSCOPY WITH PROPOFOL;  Surgeon: Toney Reil, MD;  Location: [facility] ENDOSCOPY;  Service: Gastroenterology;  Laterality: N/A;   DEXA  07/06/2011   normal T score -1.0 femur, spine   ESOPHAGOGASTRODUODENOSCOPY  02/25/2005   Dilated stricture   ESOPHAGOGASTRODUODENOSCOPY   10/2017   esophageal stenosis dilated, duodenal diverticuluc (Nandigam)   ESOPHAGOGASTRODUODENOSCOPY (EGD) WITH PROPOFOL N/A 07/28/2023   Procedure: ESOPHAGOGASTRODUODENOSCOPY (EGD) WITH PROPOFOL;  Surgeon: Toney Reil, MD;  Location: [facility] ENDOSCOPY;  Service: Gastroenterology;  Laterality: N/A;   HEMICOLECTOMY  1980   all small intestine, none large   OTHER SURGICAL HISTORY  1960s, 1978, 1980   ileal resection x 3 and subtotal colon resection   POLYPECTOMY  07/28/2023   Procedure: POLYPECTOMY INTESTINAL;  Surgeon: Toney Reil, MD;  Location: [facility] ENDOSCOPY;  Service: Gastroenterology;;   TOTAL ABDOMINAL HYSTERECTOMY  1975   ovaries out as well   UPPER GASTROINTESTINAL ENDOSCOPY       Current Outpatient Medications:    amLODipine (NORVASC) 10 MG tablet, Take 1 tablet (10 mg total) by mouth daily., Disp: 90 tablet, Rfl: 3   calcium carbonate (ANTACID CALCIUM) 500 MG chewable tablet, Chew 1 tablet (200 mg of elemental calcium total) by mouth daily., Disp: , Rfl:    Cholecalciferol (VITAMIN D) 50 MCG (2000 UT) CAPS, Take 1 capsule (2,000 Units total) by mouth daily., Disp: 90 capsule, Rfl: 3   cyanocobalamin (VITAMIN B12) 1000 MCG/ML injection, Inject 1 mL (1,000 mcg total) into the skin every 30 (thirty) days., Disp: 10 mL, Rfl: 1   folic acid (FOLVITE) 800 MCG tablet, Take 1 tablet (800 mcg total) by mouth daily., Disp: , Rfl:    levothyroxine (SYNTHROID) 25 MCG tablet, Take 1 tablet (25 mcg total) by mouth daily before breakfast., Disp: 90 tablet, Rfl: 1   magnesium chloride (SLOW-MAG) 64 MG TBEC SR tablet, Take 2 tablets by mouth 2 (two) times daily., Disp: , Rfl:    pantoprazole (PROTONIX) 40 MG tablet, TAKE ONE TABLET BY MOUTH ONCE DAILY, Disp: 90 tablet, Rfl: 3   predniSONE (DELTASONE) 5 MG tablet, TAKE ONE TABLET (5 MG TOTAL) BY MOUTH DAILY WITH BREAKFAST., Disp: 90 tablet, Rfl: 1   propranolol (INDERAL) 20 MG tablet, TAKE 1 TABLET BY MOUTH TWICE A DAY, Disp: 60 tablet,  Rfl: 3   SYRINGE/NEEDLE, DISP, 1 ML 23G X 1" 1 ML MISC, Use as directed, Disp: 8 each, Rfl: 0  Current Facility-Administered Medications:    denosumab (PROLIA) injection 60 mg, 60 mg, Subcutaneous, Once, Eustaquio Boyden, MD   Family History  Problem Relation Age of Onset   Crohn's disease Father        colostomy   Hypertension Mother    Heart disease Mother 17   Alcohol abuse Brother    Cirrhosis Brother    Crohn's disease Sister    Crohn's disease Paternal Aunt        x 3   Crohn's disease Other        neice x 2   Colon cancer Neg Hx    Cancer Neg Hx    Diabetes Neg Hx    Rectal cancer Neg Hx    Stomach cancer Neg Hx    Esophageal cancer Neg Hx    Breast cancer Neg Hx      Social History   Tobacco Use   Smoking status: Former    Current packs/day: 0.00    Average packs/day: 0.3 packs/day for 15.0 years (3.8 ttl pk-yrs)    Types: Cigarettes    Start date: 10/27/1963    Quit date: 10/26/1978    Years since quitting: 45.2   Smokeless tobacco: Never  Vaping Use   Vaping status: Never Used  Substance Use Topics   Alcohol use: No   Drug use: No    Allergies as of 12/29/2023 - Review Complete 12/29/2023  Allergen Reaction Noted   Celexa [citalopram hydrobromide] Other (See Comments) 01/10/2014    Review of Systems:    All systems reviewed and negative except where noted in HPI.   Physical Exam:  BP 119/70 (BP Location: Left Arm, Patient Position: Sitting, Cuff Size: Normal)   Pulse 75   Temp 97.6 F (36.4 C) (Oral)   Ht 4\' 11"  (1.499 m)   Wt 116 lb 2 oz (  52.7 kg)   BMI 23.45 kg/m  No LMP recorded. Patient has had a hysterectomy.  General:   Alert,  Well-developed, well-nourished, pleasant and cooperative in NAD Head:  Normocephalic and atraumatic. Eyes:  Sclera clear, no icterus.   Conjunctiva pink. Ears:  Normal auditory acuity. Nose:  No deformity, discharge, or lesions. Mouth:  No deformity or lesions,oropharynx pink & moist. Neck:  Supple; no masses  or thyromegaly. Lungs:  Respirations even and unlabored.  Clear throughout to auscultation.   No wheezes, crackles, or rhonchi. No acute distress. Heart:  Regular rate and rhythm; no murmurs, clicks, rubs, or gallops. Abdomen:  Normal bowel sounds. Soft, non-tender and non-distended without masses, hepatosplenomegaly or hernias noted.  No guarding or rebound tenderness.   Rectal: Not performed Msk:  Symmetrical without gross deformities. Good, equal movement & strength bilaterally. Pulses:  Normal pulses noted. Extremities:  No clubbing or edema.  No cyanosis. Neurologic:  Alert and oriented x3;  grossly normal neurologically. Skin:  Intact without significant lesions or rashes. No jaundice. Psych:  Alert and cooperative. Normal mood and affect.  Imaging Studies: Reviewed  Assessment and Plan:   Michelle Vasquez is a 73 y.o. pleasant Caucasian female with small bowel Crohn's diagnosed at age 57, s/p ileocecal resection in 1981, with postop recurrence at the anastomosis, previously maintained on mercaptopurine and budesonide, both have been discontinued.  Diagnosed with secondary adrenal insufficiency secondary to chronic steroid use for her Crohn's disease, maintained on low-dose prednisone.  Postop recurrence of Crohn's disease in the neoterminal ileum From a previous discussion, patient agreed to try vedolizumab.  I have discussed the risks and benefits of vedolizumab during last visit, she still had questions regarding the safety of medication.  I reassured her that there is negligible risk for lymphoma or solid organ skin cancer.  She was specifically worried about increased abdominal girth and I reassured her that she should not worry about weight gain or obesity while on medication.  She is willing to try.  She is hesitant to self administer injectable form.  Informed her that she will have a nurse who can train her to self inject vedolizumab.  Up-to-date with zoster vaccine, hepatitis B  vaccine, pneumonia vaccines, influenza vaccine   Follow up in 4-6 months   Arlyss Repress, MD

## 2024-01-01 ENCOUNTER — Other Ambulatory Visit: Payer: Self-pay | Admitting: Family Medicine

## 2024-01-01 DIAGNOSIS — I10 Essential (primary) hypertension: Secondary | ICD-10-CM

## 2024-01-01 NOTE — Progress Notes (Signed)
 Recheck Umicroalb/cr ratio - no charge

## 2024-01-05 NOTE — Telephone Encounter (Signed)
 Called patient to review provider recommendations on results. Lab appointment has been scheduled. , Lab order placed for future. There will be no charge to patient for this visit. Note of this made in appointment desk.

## 2024-01-06 ENCOUNTER — Other Ambulatory Visit (INDEPENDENT_AMBULATORY_CARE_PROVIDER_SITE_OTHER)

## 2024-01-06 ENCOUNTER — Encounter: Payer: Self-pay | Admitting: Family Medicine

## 2024-01-06 DIAGNOSIS — I10 Essential (primary) hypertension: Secondary | ICD-10-CM

## 2024-01-06 LAB — MICROALBUMIN / CREATININE URINE RATIO
Creatinine,U: 47.2 mg/dL
Microalb Creat Ratio: 15.8 mg/g (ref 0.0–30.0)
Microalb, Ur: 0.7 mg/dL (ref 0.0–1.9)

## 2024-01-10 NOTE — Telephone Encounter (Signed)
 Per Sherrilyn Rist with [facility]- we are waiting to hear back from patient to get her scheduled.. we left a few messages for her.  I sent Raiford Noble a message to be sure he worked out the injection part   Per [employer]! Happy Friday!  Checking [employer], they do have nurse educators who can provide one-on-one guidance for patients (looks like remotely).  They also have an Injection Training Kit they will send you to assist getting used to injections.  Here is a link: Welcome to [facility]  ENTYVIO (vedolizumab) Thank you so much and have a great weekend!  I called patient and she states every time she calls the representative that calls right back. She states but they never answer. Gave her Sherrilyn Rist number to call and she states she will call them   I filled out and faxed in the [facility] connect form on 10/14/2024

## 2024-01-11 NOTE — Telephone Encounter (Signed)
 Per Sherrilyn Rist with [facility] Ms cafaro is set up for her 1st infusion for 3/24

## 2024-01-17 DIAGNOSIS — K50918 Crohn's disease, unspecified, with other complication: Secondary | ICD-10-CM | POA: Diagnosis not present

## 2024-01-26 MED ORDER — DENOSUMAB 60 MG/ML ~~LOC~~ SOSY
60.0000 mg | PREFILLED_SYRINGE | SUBCUTANEOUS | Status: AC
Start: 2024-04-18 — End: 2024-10-14

## 2024-01-26 NOTE — Addendum Note (Signed)
 Addended by: Jaynee Eagles C on: 01/26/2024 12:39 PM   Modules accepted: Orders

## 2024-01-31 DIAGNOSIS — K50918 Crohn's disease, unspecified, with other complication: Secondary | ICD-10-CM | POA: Diagnosis not present

## 2024-02-07 ENCOUNTER — Other Ambulatory Visit: Payer: Self-pay | Admitting: Family Medicine

## 2024-02-07 DIAGNOSIS — I10 Essential (primary) hypertension: Secondary | ICD-10-CM

## 2024-02-24 ENCOUNTER — Encounter: Payer: Self-pay | Admitting: Internal Medicine

## 2024-02-24 ENCOUNTER — Ambulatory Visit: Payer: Self-pay | Admitting: *Deleted

## 2024-02-24 ENCOUNTER — Ambulatory Visit (INDEPENDENT_AMBULATORY_CARE_PROVIDER_SITE_OTHER): Admitting: Internal Medicine

## 2024-02-24 VITALS — BP 128/74 | HR 72 | Temp 98.8°F | Ht 59.0 in | Wt 118.0 lb

## 2024-02-24 DIAGNOSIS — G629 Polyneuropathy, unspecified: Secondary | ICD-10-CM | POA: Diagnosis not present

## 2024-02-24 DIAGNOSIS — E538 Deficiency of other specified B group vitamins: Secondary | ICD-10-CM

## 2024-02-24 DIAGNOSIS — M7989 Other specified soft tissue disorders: Secondary | ICD-10-CM | POA: Diagnosis not present

## 2024-02-24 NOTE — Assessment & Plan Note (Signed)
 Feet and hand symptoms sound like neuropathy May be why she is having trouble sleeping ---suggested trying lidocaine  gel/patch Might want to consider gabapentin

## 2024-02-24 NOTE — Telephone Encounter (Signed)
 Noted---I will check her this afternoon

## 2024-02-24 NOTE — Telephone Encounter (Signed)
  Chief Complaint: dizziness, SOB laying down, N/T hands and feet, sleep issues Symptoms: BP 117/59. Not sleeping well and difficulty falling asleep and staying asleep, dizziness at times. Blanket on feet causes pain. OTC medications not helping for sleep, melatonin  Frequency: since 02/21/24 Pertinent Negatives: Patient denies chest pain no difficulty  breathing  Disposition: [] ED /[] Urgent Care (no appt availability in office) / [x] Appointment(In office/virtual)/ []  [facility]/ [] Home Care/ [] Refused Recommended Disposition /[] [facility]/ []  Follow-up with PCP Additional Notes:   No available appt with PCP today . Scheduled with other provider in office today .       Copied from CRM [id number]. Topic: Clinical - Medical Advice >> Feb 24, 2024 11:57 AM Rosaria Common wrote: Reason for CRM: Pt began to feel numbness/tingling/swelling/pain in both feet on 4/28. Bp is running low, and having issues falling and staying asleep. Taken melatonin pills and not helping. Reason for Disposition  Numbness in a leg or foot (i.e., loss of sensation)  Answer Assessment - Initial Assessment Questions 1. ONSET: "When did the pain start?"      4/28  2. LOCATION: "Where is the pain located?"      Bilateral feet  3. PAIN: "How bad is the pain?"    (Scale 1-10; or mild, moderate, severe)   -  MILD (1-3): doesn't interfere with normal activities    -  MODERATE (4-7): interferes with normal activities (e.g., work or school) or awakens from sleep, limping    -  SEVERE (8-10): excruciating pain, unable to do any normal activities, unable to walk     8/10  4. WORK OR EXERCISE: "Has there been any recent work or exercise that involved this part of the body?"      Na  5. CAUSE: "What do you think is causing the leg pain?"     Not sure  6. OTHER SYMPTOMS: "Do you have any other symptoms?" (e.g., chest pain, back pain, breathing difficulty, swelling, rash, fever, numbness, weakness)     When  laying down feels SOB . Not able to sleep or sleep all night  greater than 10 days, dizziness at times. N/T hands and feet ,mild swelling feet.  7. PREGNANCY: "Is there any chance you are pregnant?" "When was your last menstrual period?"     Na  Protocols used: Leg Pain-A-AH

## 2024-02-24 NOTE — Assessment & Plan Note (Signed)
 Due for injection now--if low, may need to get more frequently

## 2024-02-24 NOTE — Progress Notes (Signed)
 Subjective:    Patient ID: Michelle Vasquez, female    DOB: Aug 05, 1951, 73 y.o.   MRN: [id number]  HPI Here with multiple physical concerns  Having feet swelling  Noticed it about 10 days ago--improved and then worsened No recent medication change--but amlodipine  increased in February  Tingling in hands and feet This is new ?some balance issues Gets monthly B12 injections--- is due now  Not sleeping Goes back 4-5 days Uses melatonin--can initiate with this, but can't get back to sleep easily upon awakening No new stressors No depression but is fairly anxious  Current Outpatient Medications on File Prior to Visit  Medication Sig Dispense Refill   amLODipine  (NORVASC ) 10 MG tablet Take 1 tablet (10 mg total) by mouth daily. 90 tablet 3   calcium  carbonate (ANTACID CALCIUM ) 500 MG chewable tablet Chew 1 tablet (200 mg of elemental calcium  total) by mouth daily.     Cholecalciferol  (VITAMIN D ) 50 MCG (2000 UT) CAPS Take 1 capsule (2,000 Units total) by mouth daily. 90 capsule 3   cyanocobalamin  (VITAMIN B12) 1000 MCG/ML injection Inject 1 mL (1,000 mcg total) into the skin every 30 (thirty) days. 10 mL 1   folic acid  (FOLVITE ) 800 MCG tablet Take 1 tablet (800 mcg total) by mouth daily.     levothyroxine  (SYNTHROID ) 25 MCG tablet Take 1 tablet (25 mcg total) by mouth daily before breakfast. 90 tablet 1   magnesium  chloride (SLOW-MAG) 64 MG TBEC SR tablet Take 2 tablets by mouth 2 (two) times daily.     pantoprazole  (PROTONIX ) 40 MG tablet TAKE ONE TABLET BY MOUTH ONCE DAILY 90 tablet 3   predniSONE  (DELTASONE ) 5 MG tablet TAKE ONE TABLET (5 MG TOTAL) BY MOUTH DAILY WITH BREAKFAST. 90 tablet 1   propranolol  (INDERAL ) 20 MG tablet TAKE ONE TABLET BY MOUTH TWICE A DAY 60 tablet 4   SYRINGE/NEEDLE, DISP, 1 ML 23G X 1" 1 ML MISC Use as directed 8 each 0   Current Facility-Administered Medications on File Prior to Visit  Medication Dose Route Frequency Provider Last Rate Last Admin    denosumab  (PROLIA ) injection 60 mg  60 mg Subcutaneous Once Claire Crick, MD       [START ON 04/18/2024] denosumab  (PROLIA ) injection 60 mg  60 mg Subcutaneous Q6 months Claire Crick, MD        Allergies  Allergen Reactions   Celexa  [Citalopram  Hydrobromide] Other (See Comments)    Racing thoughts, couldn't sleep    Past Medical History:  Diagnosis Date   Allergy    seasonal   Anxiety and depression 07/03/2011   B12 deficiency    Blood transfusion without reported diagnosis    Central hearing loss    COVID-19 virus infection 03/03/2021   Crohn disease (HCC)    s/p colectomy on , ?bacterial overgrowth   Facial cellulitis 01/15/2023   Fatty liver 12/15/2011   History of small bowel obstruction    HTN (hypertension)    Internal hemorrhoids without mention of complication    Osteomalacia, unspecified    Perennial allergic rhinitis with seasonal variation    Squamous cell cancer of skin of left hand 11/2022   Dr Deann Exon   Unspecified deficiency anemia    Unspecified polyarthropathy or polyarthritis, multiple sites     Past Surgical History:  Procedure Laterality Date   APPENDECTOMY     BIOPSY  07/28/2023   Procedure: BIOPSY;  Surgeon: Selena Daily, MD;  Location: [facility];  Service: Gastroenterology;;   COLONOSCOPY  02/25/2005   Internal hemms; crohns; narrow anast   COLONOSCOPY  02/26/2010   Prior right hemicolectomy o/w benign (Dr. Grandville Lax)   COLONOSCOPY  02/2015   benign biopsies, crohn's with stenotic stricture at anastomosis Grandville Lax)   COLONOSCOPY  12/2019   small adenomas, crohn's disease small intestine, diverticulosis, non-patent end to side ileocolonic anastomosis with inflammation and ulceration biopsied, rpt 3 yrs (Brahmbhatt)   COLONOSCOPY WITH ESOPHAGOGASTRODUODENOSCOPY (EGD)  09/2023   COLONOSCOPY WITH PROPOFOL  N/A 07/28/2023   Procedure: COLONOSCOPY WITH PROPOFOL ;  Surgeon: Selena Daily, MD;  Location: [facility];  Service:  Gastroenterology;  Laterality: N/A;   DEXA  07/06/2011   normal T score -1.0 femur, spine   ESOPHAGOGASTRODUODENOSCOPY  02/25/2005   Dilated stricture   ESOPHAGOGASTRODUODENOSCOPY  10/2017   esophageal stenosis dilated, duodenal diverticuluc (Nandigam)   ESOPHAGOGASTRODUODENOSCOPY (EGD) WITH PROPOFOL  N/A 07/28/2023   Procedure: ESOPHAGOGASTRODUODENOSCOPY (EGD) WITH PROPOFOL ;  Surgeon: Selena Daily, MD;  Location: [facility];  Service: Gastroenterology;  Laterality: N/A;   HEMICOLECTOMY  1980   all small intestine, none large   OTHER SURGICAL HISTORY  1960s, 1978, 1980   ileal resection x 3 and subtotal colon resection   POLYPECTOMY  07/28/2023   Procedure: POLYPECTOMY INTESTINAL;  Surgeon: Selena Daily, MD;  Location: [facility];  Service: Gastroenterology;;   TOTAL ABDOMINAL HYSTERECTOMY  1975   ovaries out as well   UPPER GASTROINTESTINAL ENDOSCOPY      Family History  Problem Relation Age of Onset   Crohn's disease Father        colostomy   Hypertension Mother    Heart disease Mother 72   Alcohol abuse Brother    Cirrhosis Brother    Crohn's disease Sister    Crohn's disease Paternal Aunt        x 3   Crohn's disease Other        neice x 2   Colon cancer Neg Hx    Cancer Neg Hx    Diabetes Neg Hx    Rectal cancer Neg Hx    Stomach cancer Neg Hx    Esophageal cancer Neg Hx    Breast cancer Neg Hx     Social History   Socioeconomic History   Marital status: Divorced    Spouse name: Not on file   Number of children: 3   Years of education: Not on file   Highest education level: GED or equivalent  Occupational History   Occupation: Chiropractor: OTHER    Comment: [employer]  Tobacco Use   Smoking status: Former    Current packs/day: 0.00    Average packs/day: 0.3 packs/day for 15.0 years (3.8 ttl pk-yrs)    Types: Cigarettes    Start date: 10/27/1963    Quit date: 10/26/1978    Years since quitting: 45.3    Smokeless tobacco: Never  Vaping Use   Vaping status: Never Used  Substance and Sexual Activity   Alcohol use: No   Drug use: No   Sexual activity: Not on file  Other Topics Concern   Not on file  Social History Narrative   Caffeine: 2 cups coffee/day   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, no regular activity   Diet: good water, fruits/vegetables occasionally   Social Drivers of Health   Financial Resource Strain: Low Risk  (12/12/2023)   Overall Financial Resource Strain (CARDIA)    Difficulty  of Paying Living Expenses: Not very hard  Food Insecurity: No Food Insecurity (12/12/2023)   Hunger Vital Sign    Worried About Running Out of Food in the Last Year: Never true    Ran Out of Food in the Last Year: Never true  Transportation Needs: No Transportation Needs (12/12/2023)   PRAPARE - Administrator, Civil Service (Medical): No    Lack of Transportation (Non-Medical): No  Physical Activity: Inactive (12/12/2023)   Exercise Vital Sign    Days of Exercise per Week: 0 days    Minutes of Exercise per Session: 20 min  Stress: Stress Concern Present (12/12/2023)   [employer] of Occupational Health - Occupational Stress Questionnaire    Feeling of Stress : Very much  Social Connections: Moderately Isolated (12/12/2023)   Social Connection and Isolation Panel [NHANES]    Frequency of Communication with Friends and Family: Once a week    Frequency of Social Gatherings with Friends and Family: Never    Attends Religious Services: More than 4 times per year    Active Member of [employer] or Organizations: Yes    Attends Banker Meetings: More than 4 times per year    Marital Status: Widowed  Intimate Partner Violence: Not At Risk (05/10/2023)   Humiliation, Afraid, Rape, and Kick questionnaire    Fear of Current or Ex-Partner: No    Emotionally Abused: No    Physically Abused: No    Sexually Abused: No    Review of Systems Appetite is fine Weight is stable Coffee in the morning--no caffeine after that    Objective:   Physical Exam Constitutional:      Appearance: Normal appearance.  Cardiovascular:     Rate and Rhythm: Normal rate and regular rhythm.     Pulses: Normal pulses.     Heart sounds: No murmur heard.    No gallop.  Pulmonary:     Effort: Pulmonary effort is normal.     Breath sounds: Normal breath sounds. No wheezing or rales.  Abdominal:     Palpations: Abdomen is soft.     Tenderness: There is no abdominal tenderness.  Musculoskeletal:     Cervical back: Neck supple.     Comments: At most trace edema in ankles  Lymphadenopathy:     Cervical: No cervical adenopathy.  Neurological:     Mental Status: She is alert.  Psychiatric:        Mood and Affect: Mood normal.        Behavior: Behavior normal.            Assessment & Plan:

## 2024-02-24 NOTE — Assessment & Plan Note (Signed)
 Fairly minimal Likely influenced by the amlodipine ---but she mentions trouble putting covers on---and this sounds more like neuropathy

## 2024-02-24 NOTE — Telephone Encounter (Signed)
 Thank you for seeing this very nice patient.

## 2024-02-25 ENCOUNTER — Telehealth: Payer: Self-pay | Admitting: Gastroenterology

## 2024-02-25 LAB — HEPATIC FUNCTION PANEL
ALT: 20 U/L (ref 0–35)
AST: 20 U/L (ref 0–37)
Albumin: 4.3 g/dL (ref 3.5–5.2)
Alkaline Phosphatase: 54 U/L (ref 39–117)
Bilirubin, Direct: 0.1 mg/dL (ref 0.0–0.3)
Total Bilirubin: 0.5 mg/dL (ref 0.2–1.2)
Total Protein: 7.1 g/dL (ref 6.0–8.3)

## 2024-02-25 LAB — RENAL FUNCTION PANEL
Albumin: 4.3 g/dL (ref 3.5–5.2)
BUN: 13 mg/dL (ref 6–23)
CO2: 24 meq/L (ref 19–32)
Calcium: 8.8 mg/dL (ref 8.4–10.5)
Chloride: 107 meq/L (ref 96–112)
Creatinine, Ser: 0.94 mg/dL (ref 0.40–1.20)
GFR: 60.2 mL/min (ref >60.00)
Glucose, Bld: 155 mg/dL — ABNORMAL HIGH (ref 70–99)
Phosphorus: 3.1 mg/dL (ref 2.3–4.6)
Potassium: 3.9 meq/L (ref 3.5–5.1)
Sodium: 142 meq/L (ref 135–145)

## 2024-02-25 LAB — TSH: TSH: 1.91 u[IU]/mL (ref 0.35–5.50)

## 2024-02-25 LAB — SEDIMENTATION RATE: Sed Rate: 5 mm/h (ref 0–30)

## 2024-02-25 LAB — VITAMIN B12: Vitamin B-12: 237 pg/mL (ref 211–911)

## 2024-02-25 NOTE — Telephone Encounter (Signed)
 The patient called and left a voicemail stating that she received a message indicating it is time to schedule her appointment with Dr. Baldomero Bone. I contacted the patient and offered an appointment at the [facility] location, but she declined, stating that [place] is closer for her.  I informed her that there are currently no available appointments in [place]. She requested that I send a message to [place] to call her. I also offered assistance with MyChart, but the patient stated that she prefers to be seen in person.

## 2024-02-26 LAB — CBC
HCT: 39.8 % (ref 36.0–46.0)
Hemoglobin: 12.9 g/dL (ref 12.0–15.0)
MCHC: 32.3 g/dL (ref 30.0–36.0)
MCV: 97.8 fl (ref 78.0–100.0)
Platelets: 298 10*3/uL (ref 150.0–400.0)
RBC: 4.07 Mil/uL (ref 3.87–5.11)
RDW: 15.3 % (ref 11.5–15.5)
WBC: 7.5 10*3/uL (ref 4.0–10.5)

## 2024-02-26 LAB — PROTEIN ELECTROPHORESIS, SERUM, WITH REFLEX
Albumin ELP: 4.2 g/dL (ref 3.8–4.8)
Alpha 1: 0.3 g/dL (ref 0.2–0.3)
Alpha 2: 0.9 g/dL (ref 0.5–0.9)
Beta 2: 0.4 g/dL (ref 0.2–0.5)
Beta Globulin: 0.5 g/dL (ref 0.4–0.6)
Gamma Globulin: 1 g/dL (ref 0.8–1.7)
Total Protein: 7.2 g/dL (ref 6.1–8.1)

## 2024-02-28 ENCOUNTER — Encounter: Payer: Self-pay | Admitting: Internal Medicine

## 2024-02-28 NOTE — Telephone Encounter (Signed)
 Patient states that she really does not want to drive to [place]. Informed her she can schedule a appointment with Dr. Baldomero Bone at [facility] when she goes over there in June. Patient states she will just wait till June to see Dr. Baldomero Bone then. She will call over there and schedule appointment.

## 2024-03-01 ENCOUNTER — Telehealth: Payer: Self-pay

## 2024-03-01 MED ORDER — ENTYVIO PEN 108 MG/0.68ML ~~LOC~~ SOAJ: 1.0000 | SUBCUTANEOUS | 3 refills | Status: AC

## 2024-03-01 NOTE — Telephone Encounter (Signed)
 Per Terri Fester with [facility] Natalie Bailey Ms.Tihani Polaski completed her last infusion on 3/31 and I think our team was thinking she was staying on infusion but realized it was supposed to triage to Entyvio injections.  Can you escribe the prescription for the pen so we can work on it asap- I have provided the clinicals to the specialty side but just need order  Sent medication by E-subscribed to [employer]

## 2024-03-01 NOTE — Telephone Encounter (Signed)
 Open 2 notes by mistake error

## 2024-03-02 NOTE — Telephone Encounter (Signed)
 Patient called back and states that she does not know what is going on with the medication. She states she was on the phone with [facility] for 40 minutes today and they are saying insurance will not cover the medication. I asked if it needed a PA and she said she did not know they told her to call the doctor office. Called Terri Fester she is going to call Sharin David to find out what is going on with the medication and insurance

## 2024-03-02 NOTE — Telephone Encounter (Signed)
 Submitted Entyvio PA injection through cover my meds. Waiting on response from insurance company

## 2024-03-02 NOTE — Telephone Encounter (Signed)
 Called patient informed patient that we submitted PA to her insurance we are waiting to find out if they are going to approved the medication. Inform her we would call her and let her know as soon as we find out.

## 2024-03-02 NOTE — Telephone Encounter (Signed)
 Called and left a message for call back

## 2024-03-02 NOTE — Telephone Encounter (Signed)
 The patient called in to speak to [facility] about her Entyvio injections. The patient said that her insurance will not pay for her injection.

## 2024-03-06 ENCOUNTER — Other Ambulatory Visit: Payer: Self-pay | Admitting: "Endocrinology

## 2024-03-15 ENCOUNTER — Encounter: Payer: Self-pay | Admitting: Family Medicine

## 2024-03-15 ENCOUNTER — Ambulatory Visit (INDEPENDENT_AMBULATORY_CARE_PROVIDER_SITE_OTHER): Payer: HMO | Admitting: Family Medicine

## 2024-03-15 VITALS — BP 138/70 | HR 68 | Temp 98.4°F | Ht 59.0 in | Wt 117.0 lb

## 2024-03-15 DIAGNOSIS — E538 Deficiency of other specified B group vitamins: Secondary | ICD-10-CM

## 2024-03-15 DIAGNOSIS — K50118 Crohn's disease of large intestine with other complication: Secondary | ICD-10-CM | POA: Diagnosis not present

## 2024-03-15 DIAGNOSIS — M818 Other osteoporosis without current pathological fracture: Secondary | ICD-10-CM

## 2024-03-15 DIAGNOSIS — E2740 Unspecified adrenocortical insufficiency: Secondary | ICD-10-CM | POA: Diagnosis not present

## 2024-03-15 DIAGNOSIS — G629 Polyneuropathy, unspecified: Secondary | ICD-10-CM | POA: Diagnosis not present

## 2024-03-15 DIAGNOSIS — I10 Essential (primary) hypertension: Secondary | ICD-10-CM

## 2024-03-15 MED ORDER — CYANOCOBALAMIN 1000 MCG/ML IJ SOLN
1000.0000 ug | Freq: Once | INTRAMUSCULAR | Status: AC
  Administered 2024-03-15: 1000 ug via INTRAMUSCULAR

## 2024-03-15 NOTE — Progress Notes (Signed)
 Ph: [phone] Fax: [phone]   Patient ID: Michelle Vasquez, female    DOB: 12-15-1950, 73 y.o.   MRN: [id number]  This visit was conducted in person.  BP 138/70   Pulse 68   Temp 98.4 F (36.9 C) (Oral)   Ht 4\' 11"  (1.499 m)   Wt 117 lb (53.1 kg)   SpO2 97%   BMI 23.63 kg/m    CC: 3 mo f/u visit  Subjective:   HPI: Michelle Vasquez is a 73 y.o. female presenting on 03/15/2024 for Medical Management of Chronic Issues (Here for 3 mo HTN f/u.)   Did not have a good experience with call center last time she requested acute appt. Discussed this.   Started Entyvio through GI for crohn's disease - completed the first 2 infusions, now starting pen. Has felt very well with this.   Recently saw Dr Joelle Musca for leg pain and swelling ?neuropathy - labs overall reassuring (SPEP, TSH, ESR, CBC, CMP), but b12 low at 237. This is despite monthly B12 shots, latest 02/24/2024.   HTN - Compliant with current antihypertensive regimen of amlodipine  10mg  daily, propranolol  20mg  bid.  Does check blood pressures at home: 120s/70 at home. No low blood pressure readings or symptoms of dizziness/syncope. Denies HA, vision changes, CP/tightness, SOB, leg swelling.   She switched amlodipine  10mg  to night time dosing with improvement in pedal edema as well as notes she's sleeping better.   Adrenal insufficiency followed by endocrinology Dr [place] Antonio on low dose prednisone  5mg  daily.   Newly diagnosed levothyroxine , now on levothyroxine .   B12 deficiency - she's been having trouble getting monthly B12 shots - her sister was giving this to her but was not regular with this.      Relevant past medical, surgical, family and social history reviewed and updated as indicated. Interim medical history since our last visit reviewed. Allergies and medications reviewed and updated. Outpatient Medications Prior to Visit  Medication Sig Dispense Refill   amLODipine  (NORVASC ) 10 MG tablet Take 1 tablet (10 mg  total) by mouth daily. 90 tablet 3   calcium  carbonate (ANTACID CALCIUM ) 500 MG chewable tablet Chew 1 tablet (200 mg of elemental calcium  total) by mouth daily.     Cholecalciferol  (VITAMIN D ) 50 MCG (2000 UT) CAPS Take 1 capsule (2,000 Units total) by mouth daily. 90 capsule 3   cyanocobalamin  (VITAMIN B12) 1000 MCG/ML injection Inject 1 mL (1,000 mcg total) into the skin every 30 (thirty) days. 10 mL 1   folic acid  (FOLVITE ) 800 MCG tablet Take 1 tablet (800 mcg total) by mouth daily.     levothyroxine  (SYNTHROID ) 25 MCG tablet Take 1 tablet (25 mcg total) by mouth daily before breakfast. 90 tablet 1   magnesium  chloride (SLOW-MAG) 64 MG TBEC SR tablet Take 2 tablets by mouth 2 (two) times daily.     pantoprazole  (PROTONIX ) 40 MG tablet TAKE ONE TABLET BY MOUTH ONCE DAILY 90 tablet 3   predniSONE  (DELTASONE ) 5 MG tablet TAKE ONE TABLET (5 MG TOTAL) BY MOUTH DAILY WITH BREAKFAST. 90 tablet 1   propranolol  (INDERAL ) 20 MG tablet TAKE ONE TABLET BY MOUTH TWICE A DAY 60 tablet 4   SYRINGE/NEEDLE, DISP, 1 ML 23G X 1" 1 ML MISC Use as directed 8 each 0   Vedolizumab (ENTYVIO PEN) 108 MG/0.68ML SOAJ Inject 1 Pen into the skin every 14 (fourteen) days. 4.08 mL 3   Facility-Administered Medications Prior to Visit  Medication Dose Route Frequency Provider  Last Rate Last Admin   denosumab  (PROLIA ) injection 60 mg  60 mg Subcutaneous Once Bruna Dills, MD       Cecily Cohen ON 04/18/2024] denosumab  (PROLIA ) injection 60 mg  60 mg Subcutaneous Q6 months Claire Crick, MD         Per HPI unless specifically indicated in ROS section below Review of Systems  Objective:  BP 138/70   Pulse 68   Temp 98.4 F (36.9 C) (Oral)   Ht 4\' 11"  (1.499 m)   Wt 117 lb (53.1 kg)   SpO2 97%   BMI 23.63 kg/m   Wt Readings from Last 3 Encounters:  03/15/24 117 lb (53.1 kg)  02/24/24 118 lb (53.5 kg)  12/29/23 116 lb 2 oz (52.7 kg)      Physical Exam Vitals and nursing note reviewed.  Constitutional:       Appearance: Normal appearance. She is not ill-appearing.  HENT:     Head: Normocephalic and atraumatic.     Mouth/Throat:     Mouth: Mucous membranes are moist.     Pharynx: Oropharynx is clear. No oropharyngeal exudate or posterior oropharyngeal erythema.  Eyes:     Extraocular Movements: Extraocular movements intact.     Pupils: Pupils are equal, round, and reactive to light.  Cardiovascular:     Rate and Rhythm: Normal rate and regular rhythm.     Pulses: Normal pulses.     Heart sounds: Normal heart sounds. No murmur heard. Pulmonary:     Effort: Pulmonary effort is normal. No respiratory distress.     Breath sounds: Normal breath sounds. No wheezing, rhonchi or rales.  Musculoskeletal:     Right lower leg: No edema.     Left lower leg: No edema.  Skin:    General: Skin is warm and dry.     Findings: No rash.  Neurological:     Mental Status: She is alert.  Psychiatric:        Mood and Affect: Mood normal.        Behavior: Behavior normal.       Results for orders placed or performed in visit on 02/24/24  Serum protein electrophoresis with reflex   Collection Time: 02/24/24  3:41 PM  Result Value Ref Range   Total Protein 7.2 6.1 - 8.1 g/dL   Albumin ELP 4.2 3.8 - 4.8 g/dL   Alpha 1 0.3 0.2 - 0.3 g/dL   Alpha 2 0.9 0.5 - 0.9 g/dL   Beta Globulin 0.5 0.4 - 0.6 g/dL   Beta 2 0.4 0.2 - 0.5 g/dL   Gamma Globulin 1.0 0.8 - 1.7 g/dL   SPE Interp.    Hepatic function panel   Collection Time: 02/24/24  3:41 PM  Result Value Ref Range   Total Bilirubin 0.5 0.2 - 1.2 mg/dL   Bilirubin, Direct 0.1 0.0 - 0.3 mg/dL   Alkaline Phosphatase 54 39 - 117 U/L   AST 20 0 - 37 U/L   ALT 20 0 - 35 U/L   Total Protein 7.1 6.0 - 8.3 g/dL   Albumin 4.3 3.5 - 5.2 g/dL  Renal function panel   Collection Time: 02/24/24  3:41 PM  Result Value Ref Range   Sodium 142 135 - 145 mEq/L   Potassium 3.9 3.5 - 5.1 mEq/L   Chloride 107 96 - 112 mEq/L   CO2 24 19 - 32 mEq/L   Albumin 4.3 3.5  - 5.2 g/dL   BUN 13 6 - 23 mg/dL  Creatinine, Ser 0.94 0.40 - 1.20 mg/dL   Glucose, Bld 951 (H) 70 - 99 mg/dL   Phosphorus 3.1 2.3 - 4.6 mg/dL   GFR 88.41 >66.06 mL/min   Calcium  8.8 8.4 - 10.5 mg/dL  TSH   Collection Time: 02/24/24  3:41 PM  Result Value Ref Range   TSH 1.91 0.35 - 5.50 uIU/mL  CBC   Collection Time: 02/24/24  3:41 PM  Result Value Ref Range   WBC 7.5 4.0 - 10.5 K/uL   RBC 4.07 3.87 - 5.11 Mil/uL   Platelets 298.0 150.0 - 400.0 K/uL   Hemoglobin 12.9 12.0 - 15.0 g/dL   HCT 30.1 60.1 - 09.3 %   MCV 97.8 78.0 - 100.0 fl   MCHC 32.3 30.0 - 36.0 g/dL   RDW 23.5 57.3 - 22.0 %  Sedimentation rate   Collection Time: 02/24/24  3:41 PM  Result Value Ref Range   Sed Rate 5 0 - 30 mm/hr  Vitamin B12   Collection Time: 02/24/24  3:41 PM  Result Value Ref Range   Vitamin B-12 237 211 - 911 pg/mL    Assessment & Plan:   Problem List Items Addressed This Visit     Crohn's disease (HCC) (Chronic)   Appreciate GI care - now on Entyvio and seems to be effective/well tolerated. Planning to follow Dr Baldomero [facility]       Adrenal insufficiency [facility]) (Chronic)   Vitamin B12 deficiency - Primary   Due for injection - last done end of March.  She desires to establish with our clinic for B12 shots - will start one today then continue monthly with nurse visits      Essential hypertension   Chronic, stable. Great control as well when checked at home. Continue current regimen of amlodipine  10mg  daily and propranolol  20mg  bid.       Osteoporosis   Discussed she will be due for Prolia  after 04/18/2024      Neuropathy   Mild leg swelling with painful feet attributed to neuropathy - she notes resolution of these symptoms with switching amlodipine  from am to pm dosing.         Meds ordered this encounter  Medications   cyanocobalamin  (VITAMIN B12) injection 1,000 mcg    No orders of the defined types were placed in this encounter.   Patient Instructions   B12 shot today then schedule monthly b12 shots through our office - nurse visits.  You will be due for prolia  shot after 04/18/2024. Schedule physical in 3 months, try to get labs prior to a b12 shot.   Follow up plan: Return in about 3 months (around 06/15/2024) for annual exam, prior fasting for blood work.  Claire Crick, MD

## 2024-03-15 NOTE — Patient Instructions (Addendum)
 B12 shot today then schedule monthly b12 shots through our office - nurse visits.  You will be due for prolia  shot after 04/18/2024. Schedule physical in 3 months, try to get labs prior to a b12 shot.

## 2024-03-17 ENCOUNTER — Encounter: Payer: Self-pay | Admitting: Family Medicine

## 2024-03-17 ENCOUNTER — Telehealth: Payer: Self-pay

## 2024-03-17 ENCOUNTER — Other Ambulatory Visit (HOSPITAL_COMMUNITY): Payer: Self-pay

## 2024-03-17 NOTE — Assessment & Plan Note (Signed)
 Appreciate GI care - now on Entyvio and seems to be effective/well tolerated. Planning to follow Dr Baldomero Bone to [facility]

## 2024-03-17 NOTE — Telephone Encounter (Signed)
 Pt ready for scheduling for PROLIA  on or after : 04/18/24  Option# 1: Buy/Bill (Office supplied medication)  Out-of-pocket cost due at time of clinic visit: $332  Number of injection/visits approved: ---  Primary: [employer] Prolia  co-insurance: 20% Admin fee co-insurance: 0%  Secondary: --- Prolia  co-insurance:  Admin fee co-insurance:   Medical Benefit Details: Date Benefits were checked: 03/17/24 Deductible: NO/ Coinsurance: 20%/ Admin Fee: 0%  Prior Auth: N/A PA# Expiration Date:   # of doses approved: ----------------------------------------------------------------------- Option# 2- Med Obtained from pharmacy:  Pharmacy benefit: Copay $0 (Paid to pharmacy) Admin Fee: 0% (Pay at clinic)  Prior Auth: N/A PA# Expiration Date:   # of doses approved:   If patient wants fill through the pharmacy benefit please send prescription to: [employer]/RX ADVANCE, and include estimated need by date in rx notes. Pharmacy will ship medication directly to the office.  Patient NOT eligible for Prolia  Copay Card. Copay Card can make patient's cost as little as $25. Link to apply: [email or web address]://[email or web address]  ** This summary of benefits is an estimation of the patient's out-of-pocket cost. Exact cost may very based on individual plan coverage.

## 2024-03-17 NOTE — Assessment & Plan Note (Addendum)
 Chronic, stable. Great control as well when checked at home. Continue current regimen of amlodipine  10mg  daily and propranolol  20mg  bid.

## 2024-03-17 NOTE — Assessment & Plan Note (Signed)
 Mild leg swelling with painful feet attributed to neuropathy - she notes resolution of these symptoms with switching amlodipine  from am to pm dosing.

## 2024-03-17 NOTE — Assessment & Plan Note (Signed)
 Due for injection - last done end of March.  She desires to establish with our clinic for B12 shots - will start one today then continue monthly with nurse visits

## 2024-03-17 NOTE — Telephone Encounter (Signed)
 Prolia  VOB initiated via [email or web address]  Next Prolia  inj DUE: 04/18/24   PHARMACY BENEFIT: $0

## 2024-03-17 NOTE — Assessment & Plan Note (Signed)
 Discussed she will be due for Prolia  after 04/18/2024

## 2024-03-21 NOTE — Telephone Encounter (Signed)
 Pt is aware of the approval and she will call [facility] to schedule a follow up with Dr Baldomero Bone to continue her care

## 2024-03-21 NOTE — Telephone Encounter (Signed)
 Received notification of approval for Entyvio good from 03/02/2024-10/25/2024  Left message on voicemail for pt to return my call

## 2024-03-31 ENCOUNTER — Other Ambulatory Visit: Payer: Self-pay | Admitting: *Deleted

## 2024-03-31 ENCOUNTER — Other Ambulatory Visit: Payer: Self-pay | Admitting: Pharmacy Technician

## 2024-03-31 ENCOUNTER — Other Ambulatory Visit: Payer: Self-pay

## 2024-03-31 DIAGNOSIS — M818 Other osteoporosis without current pathological fracture: Secondary | ICD-10-CM

## 2024-03-31 MED ORDER — DENOSUMAB 60 MG/ML ~~LOC~~ SOSY
60.0000 mg | PREFILLED_SYRINGE | Freq: Once | SUBCUTANEOUS | 0 refills | Status: AC
  Filled 2024-03-31: qty 1, 1d supply, fill #0
  Filled 2024-04-04: qty 1, 180d supply, fill #0

## 2024-04-03 ENCOUNTER — Other Ambulatory Visit: Payer: Self-pay

## 2024-04-03 NOTE — Progress Notes (Signed)
 Pharmacy Patient Advocate Encounter  Insurance verification completed.   The patient is insured through [facility] ADVANTAGE/RX ADVANCE   Ran test claim for Prolia . Co-pay is $0.  This test claim was processed through [facility]- copay amounts may vary at other pharmacies due to pharmacy/plan contracts, or as the patient moves through the different stages of their insurance plan.

## 2024-04-04 ENCOUNTER — Other Ambulatory Visit: Payer: Self-pay

## 2024-04-04 NOTE — Progress Notes (Signed)
 Specialty Pharmacy Initial Fill Coordination Note  Michelle Vasquez is a 73 y.o. female contacted today regarding initial fill of specialty medication(s) Denosumab  (PROLIA )   Patient requested Courier to Provider Office   Delivery date: 04/13/24   Verified address: [facility]   Medication will be filled on 6/18.   Patient is aware of $0 copayment.

## 2024-04-06 ENCOUNTER — Other Ambulatory Visit: Payer: Self-pay

## 2024-04-14 ENCOUNTER — Other Ambulatory Visit

## 2024-04-17 ENCOUNTER — Other Ambulatory Visit

## 2024-04-18 ENCOUNTER — Ambulatory Visit

## 2024-04-19 ENCOUNTER — Ambulatory Visit

## 2024-04-25 ENCOUNTER — Ambulatory Visit

## 2024-04-25 ENCOUNTER — Other Ambulatory Visit (INDEPENDENT_AMBULATORY_CARE_PROVIDER_SITE_OTHER)

## 2024-04-25 ENCOUNTER — Other Ambulatory Visit: Payer: Self-pay | Admitting: *Deleted

## 2024-04-25 DIAGNOSIS — E538 Deficiency of other specified B group vitamins: Secondary | ICD-10-CM

## 2024-04-25 DIAGNOSIS — M818 Other osteoporosis without current pathological fracture: Secondary | ICD-10-CM

## 2024-04-25 MED ORDER — CYANOCOBALAMIN 1000 MCG/ML IJ SOLN
1000.0000 ug | Freq: Once | INTRAMUSCULAR | Status: AC
  Administered 2024-04-25: 1000 ug via INTRAMUSCULAR

## 2024-04-25 NOTE — Progress Notes (Signed)
 Per orders of Dr. Rilla, injection of B12 1000 mcg/ml  given by Clotilda SHAUNNA Pander, CMA in Right Deltoid Patient tolerated injection well.

## 2024-04-26 DIAGNOSIS — L821 Other seborrheic keratosis: Secondary | ICD-10-CM | POA: Diagnosis not present

## 2024-04-26 DIAGNOSIS — D2262 Melanocytic nevi of left upper limb, including shoulder: Secondary | ICD-10-CM | POA: Diagnosis not present

## 2024-04-26 DIAGNOSIS — L570 Actinic keratosis: Secondary | ICD-10-CM | POA: Diagnosis not present

## 2024-04-26 DIAGNOSIS — Z85828 Personal history of other malignant neoplasm of skin: Secondary | ICD-10-CM | POA: Diagnosis not present

## 2024-04-26 DIAGNOSIS — D225 Melanocytic nevi of trunk: Secondary | ICD-10-CM | POA: Diagnosis not present

## 2024-04-26 DIAGNOSIS — D2261 Melanocytic nevi of right upper limb, including shoulder: Secondary | ICD-10-CM | POA: Diagnosis not present

## 2024-04-26 DIAGNOSIS — D2272 Melanocytic nevi of left lower limb, including hip: Secondary | ICD-10-CM | POA: Diagnosis not present

## 2024-04-26 DIAGNOSIS — B078 Other viral warts: Secondary | ICD-10-CM | POA: Diagnosis not present

## 2024-04-26 DIAGNOSIS — R238 Other skin changes: Secondary | ICD-10-CM | POA: Diagnosis not present

## 2024-04-26 LAB — BASIC METABOLIC PANEL WITH GFR
BUN: 11 mg/dL (ref 6–23)
CO2: 23 meq/L (ref 19–32)
Calcium: 8.9 mg/dL (ref 8.4–10.5)
Chloride: 108 meq/L (ref 96–112)
Creatinine, Ser: 0.92 mg/dL (ref 0.40–1.20)
GFR: 61.7 mL/min (ref >60.00)
Glucose, Bld: 141 mg/dL — ABNORMAL HIGH (ref 70–99)
Potassium: 3.9 meq/L (ref 3.5–5.1)
Sodium: 141 meq/L (ref 135–145)

## 2024-04-27 ENCOUNTER — Ambulatory Visit: Payer: Self-pay | Admitting: Family Medicine

## 2024-04-27 ENCOUNTER — Ambulatory Visit

## 2024-04-27 DIAGNOSIS — H353131 Nonexudative age-related macular degeneration, bilateral, early dry stage: Secondary | ICD-10-CM | POA: Diagnosis not present

## 2024-04-27 DIAGNOSIS — M818 Other osteoporosis without current pathological fracture: Secondary | ICD-10-CM

## 2024-04-27 DIAGNOSIS — D3132 Benign neoplasm of left choroid: Secondary | ICD-10-CM | POA: Diagnosis not present

## 2024-04-27 DIAGNOSIS — H35033 Hypertensive retinopathy, bilateral: Secondary | ICD-10-CM | POA: Diagnosis not present

## 2024-04-27 DIAGNOSIS — H43813 Vitreous degeneration, bilateral: Secondary | ICD-10-CM | POA: Diagnosis not present

## 2024-04-27 DIAGNOSIS — H43393 Other vitreous opacities, bilateral: Secondary | ICD-10-CM | POA: Diagnosis not present

## 2024-04-27 DIAGNOSIS — H2513 Age-related nuclear cataract, bilateral: Secondary | ICD-10-CM | POA: Diagnosis not present

## 2024-04-27 MED ORDER — DENOSUMAB 60 MG/ML ~~LOC~~ SOSY
60.0000 mg | PREFILLED_SYRINGE | Freq: Once | SUBCUTANEOUS | Status: AC
  Administered 2024-04-27: 60 mg via SUBCUTANEOUS

## 2024-04-27 MED ORDER — DENOSUMAB 60 MG/ML ~~LOC~~ SOSY: 60.0000 mg | PREFILLED_SYRINGE | Freq: Once | SUBCUTANEOUS | Status: AC

## 2024-04-27 NOTE — Progress Notes (Signed)
 Per orders of Dr Anton Blas who is out of office and Dr. Arlyss Solian, who is in office injection of prolia  60 mg [place] given by Laray Arenas in left arm.. Patient tolerated injection well. Patient will make appointment for 6 month.

## 2024-05-10 ENCOUNTER — Ambulatory Visit (INDEPENDENT_AMBULATORY_CARE_PROVIDER_SITE_OTHER): Payer: HMO

## 2024-05-10 ENCOUNTER — Telehealth: Payer: HMO

## 2024-05-10 VITALS — Ht 59.0 in | Wt 117.0 lb

## 2024-05-10 DIAGNOSIS — H9190 Unspecified hearing loss, unspecified ear: Secondary | ICD-10-CM | POA: Diagnosis not present

## 2024-05-10 DIAGNOSIS — Z0000 Encounter for general adult medical examination without abnormal findings: Secondary | ICD-10-CM

## 2024-05-10 NOTE — Progress Notes (Signed)
 Subjective:   Michelle Vasquez is a 73 y.o. who presents for a Medicare Wellness preventive visit.  As a reminder, Annual Wellness Visits don't include a physical exam, and some assessments may be limited, especially if this visit is performed virtually. We may recommend an in-person follow-up visit with your provider if needed.  Visit Complete: Virtual I connected with  Michelle Vasquez on 05/10/24 by a audio enabled telemedicine application and verified that I am speaking with the correct person using two identifiers.  Patient Location: Home  Provider Location: Home Office  I discussed the limitations of evaluation and management by telemedicine. The patient expressed understanding and agreed to proceed.  Vital Signs: Because this visit was a virtual/telehealth visit, some criteria may be missing or patient reported. Any vitals not documented were not able to be obtained and vitals that have been documented are patient reported.  VideoDeclined- This patient declined Librarian, academic. Therefore the visit was completed with audio only.  Persons Participating in Visit: Patient.  AWV Questionnaire: No: Patient Medicare AWV questionnaire was not completed prior to this visit.  Cardiac Risk Factors include: advanced age (>56men, >34 women);hypertension     Objective:    Today's Vitals   05/10/24 1053  Weight: 117 lb (53.1 kg)  Height: 4' 11 (1.499 m)  PainSc: 5    Body mass index is 23.63 kg/m.     05/10/2024   11:11 AM 07/28/2023   10:25 AM 05/10/2023   11:16 AM 12/11/2022    4:19 PM 01/25/2022   11:49 AM 01/25/2022   11:48 AM 11/22/2020   10:37 AM  Advanced Directives  Does Patient Have a Medical Advance Directive? No No;Yes Yes Yes No No;Yes Yes  Type of Special educational needs teacher of [place];Living will Healthcare Power of [place];Living will Healthcare Power of [place]  Living will Healthcare Power of [place];Living will  Does  patient want to make changes to medical advance directive?    No - Patient declined  No - Patient declined   Copy of Healthcare Power of Attorney in Chart?   No - copy requested No - copy requested   No - copy requested  Would patient like information on creating a medical advance directive?      No - Patient declined     Current Medications (verified) Outpatient Encounter Medications as of 05/10/2024  Medication Sig   amLODipine  (NORVASC ) 10 MG tablet Take 1 tablet (10 mg total) by mouth daily.   calcium  carbonate (ANTACID CALCIUM ) 500 MG chewable tablet Chew 1 tablet (200 mg of elemental calcium  total) by mouth daily.   Cholecalciferol  (VITAMIN D ) 50 MCG (2000 UT) CAPS Take 1 capsule (2,000 Units total) by mouth daily.   cyanocobalamin  (VITAMIN B12) 1000 MCG/ML injection Inject 1 mL (1,000 mcg total) into the skin every 30 (thirty) days.   folic acid  (FOLVITE ) 800 MCG tablet Take 1 tablet (800 mcg total) by mouth daily.   levothyroxine  (SYNTHROID ) 25 MCG tablet Take 1 tablet (25 mcg total) by mouth daily before breakfast.   magnesium  chloride (SLOW-MAG) 64 MG TBEC SR tablet Take 2 tablets by mouth 2 (two) times daily.   pantoprazole  (PROTONIX ) 40 MG tablet TAKE ONE TABLET BY MOUTH ONCE DAILY   predniSONE  (DELTASONE ) 5 MG tablet TAKE ONE TABLET (5 MG TOTAL) BY MOUTH DAILY WITH BREAKFAST.   propranolol  (INDERAL ) 20 MG tablet TAKE ONE TABLET BY MOUTH TWICE A DAY   SYRINGE/NEEDLE, DISP, 1 ML 23G X 1  1 ML MISC Use as directed   Vedolizumab  (ENTYVIO  PEN) 108 MG/0.68ML SOAJ Inject 1 Pen into the skin every 14 (fourteen) days.   Facility-Administered Encounter Medications as of 05/10/2024  Medication   denosumab  (PROLIA ) injection 60 mg   denosumab  (PROLIA ) injection 60 mg   [START ON 10/28/2024] denosumab  (PROLIA ) injection 60 mg    Allergies (verified) Celexa  [citalopram  hydrobromide]   History: Past Medical History:  Diagnosis Date   Allergy    seasonal   Anxiety and depression  07/03/2011   B12 deficiency    Blood transfusion without reported diagnosis    Cataract    Central hearing loss    COVID-19 virus infection 03/03/2021   Crohn disease (HCC)    s/p colectomy on , ?bacterial overgrowth   Facial cellulitis 01/15/2023   Fatty liver 12/15/2011   GERD (gastroesophageal reflux disease)    Heart murmur    History of small bowel obstruction    HTN (hypertension)    Internal hemorrhoids without mention of complication    Osteomalacia, unspecified    Perennial allergic rhinitis with seasonal variation    Squamous cell cancer of skin of left hand 11/2022   Dr Ninetta   Unspecified deficiency anemia    Unspecified polyarthropathy or polyarthritis, multiple sites    Past Surgical History:  Procedure Laterality Date   APPENDECTOMY     BIOPSY  07/28/2023   Procedure: BIOPSY;  Surgeon: Unk Corinn Skiff, MD;  Location: [facility] ENDOSCOPY;  Service: Gastroenterology;;   COLONOSCOPY  02/25/2005   Internal hemms; crohns; narrow anast   COLONOSCOPY  02/26/2010   Prior right hemicolectomy o/w benign (Dr. Obie)   COLONOSCOPY  02/2015   benign biopsies, crohn's with stenotic stricture at anastomosis Priscilla)   COLONOSCOPY  12/2019   small adenomas, crohn's disease small intestine, diverticulosis, non-patent end to side ileocolonic anastomosis with inflammation and ulceration biopsied, rpt 3 yrs (Brahmbhatt)   COLONOSCOPY WITH ESOPHAGOGASTRODUODENOSCOPY (EGD)  09/2023   COLONOSCOPY WITH PROPOFOL  N/A 07/28/2023   Procedure: COLONOSCOPY WITH PROPOFOL ;  Surgeon: Unk Corinn Skiff, MD;  Location: [facility] ENDOSCOPY;  Service: Gastroenterology;  Laterality: N/A;   DEXA  07/06/2011   normal T score -1.0 femur, spine   ESOPHAGOGASTRODUODENOSCOPY  02/25/2005   Dilated stricture   ESOPHAGOGASTRODUODENOSCOPY  10/2017   esophageal stenosis dilated, duodenal diverticuluc (Nandigam)   ESOPHAGOGASTRODUODENOSCOPY (EGD) WITH PROPOFOL  N/A 07/28/2023   Procedure:  ESOPHAGOGASTRODUODENOSCOPY (EGD) WITH PROPOFOL ;  Surgeon: Unk Corinn Skiff, MD;  Location: [facility] ENDOSCOPY;  Service: Gastroenterology;  Laterality: N/A;   HEMICOLECTOMY  1980   all small intestine, none large   OTHER SURGICAL HISTORY  1960s, 1978, 1980   ileal resection x 3 and subtotal colon resection   POLYPECTOMY  07/28/2023   Procedure: POLYPECTOMY INTESTINAL;  Surgeon: Unk Corinn Skiff, MD;  Location: [facility] ENDOSCOPY;  Service: Gastroenterology;;   SMALL INTESTINE SURGERY     TOTAL ABDOMINAL HYSTERECTOMY  1975   ovaries out as well   UPPER GASTROINTESTINAL ENDOSCOPY     Family History  Problem Relation Age of Onset   Crohn's disease Father        colostomy   Hypertension Mother    Heart disease Mother 60   Alcohol abuse Brother    Cirrhosis Brother    Crohn's disease Sister    Crohn's disease Paternal Aunt        x 3   Crohn's disease Other        neice x 2   Colon cancer Neg Hx  Cancer Neg Hx    Diabetes Neg Hx    Rectal cancer Neg Hx    Stomach cancer Neg Hx    Esophageal cancer Neg Hx    Breast cancer Neg Hx    Social History   Socioeconomic History   Marital status: Divorced    Spouse name: Not on file   Number of children: 3   Years of education: Not on file   Highest education level: GED or equivalent  Occupational History   Occupation: Medical Transcription    Employer: OTHER    Comment: [employer]  Tobacco Use   Smoking status: Former    Current packs/day: 0.00    Average packs/day: 0.3 packs/day for 15.0 years (3.8 ttl pk-yrs)    Types: Cigarettes    Start date: 10/27/1963    Quit date: 10/26/1978    Years since quitting: 45.5   Smokeless tobacco: Never  Vaping Use   Vaping status: Never Used  Substance and Sexual Activity   Alcohol use: No   Drug use: No   Sexual activity: Not on file  Other Topics Concern   Not on file  Social History Narrative   Caffeine: 2 cups coffee/day   Divorced 1989   3 children   Medical  transcriptionist   Activity: walks some with dog, no regular activity   Diet: good water, fruits/vegetables occasionally   Social Drivers of Corporate investment banker Strain: Low Risk  (05/10/2024)   Overall Financial Resource Strain (CARDIA)    Difficulty of Paying Living Expenses: Not hard at all  Food Insecurity: No Food Insecurity (05/10/2024)   Hunger Vital Sign    Worried About Running Out of Food in the Last Year: Never true    Ran Out of Food in the Last Year: Never true  Transportation Needs: No Transportation Needs (05/10/2024)   PRAPARE - Administrator, Civil Service (Medical): No    Lack of Transportation (Non-Medical): No  Physical Activity: Sufficiently Active (05/10/2024)   Exercise Vital Sign    Days of Exercise per Week: 5 days    Minutes of Exercise per Session: 30 min  Stress: No Stress Concern Present (05/10/2024)   [employer] of Occupational Health - Occupational Stress Questionnaire    Feeling of Stress: Only a little  Social Connections: Socially Isolated (05/10/2024)   Social Connection and Isolation Panel    Frequency of Communication with Friends and Family: Once a week    Frequency of Social Gatherings with Friends and Family: Never    Attends Religious Services: More than 4 times per year    Active Member of [employer] or Organizations: No    Attends Banker Meetings: Never    Marital Status: Widowed    Tobacco Counseling Counseling given: Not Answered    Clinical Intake:  Pre-visit preparation completed: Yes  Pain : 0-10 Pain Score: 5  Pain Type: Chronic pain Pain Location: Generalized Pain Descriptors / Indicators: Aching Pain Onset: More than a month ago Pain Frequency: Intermittent Pain Relieving Factors: tylenol ,rest  Pain Relieving Factors: tylenol ,rest  BMI - recorded: 23.63 Nutritional Status: BMI of 19-24  Normal Nutritional Risks: None Diabetes: No  No results found for: HGBA1C   How often do  you need to have someone help you when you read instructions, pamphlets, or other written materials from your doctor or pharmacy?: 1 - Never  Interpreter Needed?: No  Comments: daughter is living with pt temporarily Information entered by :: B.Marlise Fahr,LPN  Activities of Daily Living     05/10/2024   11:11 AM  In your present state of health, do you have any difficulty performing the following activities:  Hearing? 1  Vision? 0  Difficulty concentrating or making decisions? 0  Walking or climbing stairs? 0  Dressing or bathing? 0  Doing errands, shopping? 0  Preparing Food and eating ? N  Using the Toilet? N  In the past six months, have you accidently leaked urine? N  Do you have problems with loss of bowel control? N  Managing your Medications? N  Managing your Finances? N  Housekeeping or managing your Housekeeping? N    Patient Care Team: [name], MD as PCP - General Christobal, Selinda HERO, OD (Optometry)  I have updated your Care Teams any recent Medical Services you may have received from other providers in the past year.     Assessment:   This is a routine wellness examination for Michelle Vasquez.  Hearing/Vision screen Hearing Screening - Comments:: Pt says her hearing is not good and with ringing in your ear Referral to audiology (pt requests) Vision Screening - Comments:: Pt says her vision is good   Goals Addressed             This Visit's Progress    COMPLETED: Patient Stated       Starting 11/11/2018, I will continue to take medications as prescribed.      COMPLETED: Patient Stated       11/20/2019, I will maintain and continue medications as prescribed.      COMPLETED: Patient Stated       11/22/2020, I will continue walking my dog everyday for about 30 minutes.     Patient Stated   On track    05/10/24-Stay active       Depression Screen     05/10/2024   11:05 AM 03/15/2024   11:01 AM 02/24/2024    3:02 PM 12/14/2023    2:09 PM 09/10/2023    11:18 AM 07/30/2023    7:35 AM 05/10/2023   11:21 AM  PHQ 2/9 Scores  PHQ - 2 Score 0 0 1 1 0 0 0  PHQ- 9 Score  2  4  5  0    Fall Risk     05/10/2024   10:58 AM 03/15/2024   11:01 AM 02/24/2024    3:02 PM 12/14/2023    2:08 PM 09/10/2023   11:18 AM  Fall Risk   Falls in the past year? 0 0 0 0 0  Number falls in past yr: 0  0    Injury with Fall? 0  0    Risk for fall due to : No Fall Risks  No Fall Risks    Follow up Education provided;Falls prevention discussed  Falls evaluation completed      MEDICARE RISK AT HOME:  Medicare Risk at Home Any stairs in or around the home?: Yes If so, are there any without handrails?: Yes Home free of loose throw rugs in walkways, pet beds, electrical cords, etc?: Yes Adequate lighting in your home to reduce risk of falls?: Yes Life alert?: No Use of a cane, walker or w/c?: No Grab bars in the bathroom?: No Shower chair or bench in shower?: No Elevated toilet seat or a handicapped toilet?: No  TIMED UP AND GO:  Was the test performed?  No  Cognitive Function: 6CIT completed    11/22/2020   10:54 AM 11/20/2019   11:20 AM 11/11/2018  10:45 AM  MMSE - Mini Mental State Exam  Orientation to time 5 5 5   Orientation to Place 5 5 5   Registration 3 3 3   Attention/ Calculation 5 5 0  Recall 3 3 3   Language- name 2 objects   0  Language- repeat 1 1 1   Language- follow 3 step command   3  Language- read & follow direction   0  Write a sentence   0  Copy design   0  Total score   20        05/10/2024   11:26 AM 05/10/2023   11:26 AM  6CIT Screen  What Year? 0 points 0 points  What month? 0 points 0 points  What time? 0 points 0 points  Count back from 20 0 points 0 points  Months in reverse 0 points 0 points  Repeat phrase 0 points 0 points  Total Score 0 points 0 points    Immunizations Immunization History  Administered Date(s) Administered   Fluad Quad(high Dose 65+) 09/07/2019, 11/25/2020, 12/12/2022   Fluad Trivalent(High  Dose 65+) 07/30/2023   Hepatitis B 04/23/2013, 05/01/2013   Hepatitis B, PED/ADOLESCENT 09/25/2013   Hepb-cpg 11/03/2023, 12/01/2023   Influenza Whole 07/26/2008   Influenza,inj,Quad PF,6+ Mos 08/13/2014, 09/01/2018   Influenza-Unspecified 08/04/2016, 07/30/2017   PFIZER(Purple Top)SARS-COV-2 Vaccination 06/07/2020, 06/28/2020   Pneumococcal Conjugate-13 11/09/2017   Pneumococcal Polysaccharide-23 03/21/2013, 11/24/2019   Td 08/07/2004   Tdap 06/11/2014   Zoster Recombinant(Shingrix) 08/10/2023, 12/29/2023   Zoster, Live 08/24/2016    Screening Tests Health Maintenance  Topic Date Due   COVID-19 Vaccine (3 - Pfizer risk series) 07/26/2020   Colonoscopy  07/27/2024   INFLUENZA VACCINE  05/26/2024   DTaP/Tdap/Td (3 - Td or Tdap) 06/11/2024   MAMMOGRAM  08/29/2024   Medicare Annual Wellness (AWV)  05/10/2025   Pneumococcal Vaccine: 50+ Years  Completed   Hepatitis B Vaccines  Completed   DEXA SCAN  Completed   Hepatitis C Screening  Completed   Zoster Vaccines- Shingrix  Completed   HPV VACCINES  Aged Out   Meningococcal B Vaccine  Aged Out    Health Maintenance  Health Maintenance Due  Topic Date Due   COVID-19 Vaccine (3 - Pfizer risk series) 07/26/2020   Colonoscopy  07/27/2024   Health Maintenance Items Addressed: Audiology referral placed  Additional Screening:  Vision Screening: Recommended annual ophthalmology exams for early detection of glaucoma and other disorders of the eye. Would you like a referral to an eye doctor? No    Dental Screening: Recommended annual dental exams for proper oral hygiene  Community Resource Referral / Chronic Care Management: CRR required this visit?  No   CCM required this visit?  No   Plan:    I have personally reviewed and noted the following in the patient's chart:   Medical and social history Use of alcohol, tobacco or illicit drugs  Current medications and supplements including opioid prescriptions. Patient is not  currently taking opioid prescriptions. Functional ability and status Nutritional status Physical activity Advanced directives List of other physicians Hospitalizations, surgeries, and ER visits in previous 12 months Vitals Screenings to include cognitive, depression, and falls Referrals and appointments  In addition, I have reviewed and discussed with patient certain preventive protocols, quality metrics, and best practice recommendations. A written personalized care plan for preventive services as well as general preventive health recommendations were provided to patient.   Michelle LITTIE Saris, LPN   2/83/7974   After Visit Summary: (  MyChart) Due to this being a telephonic visit, the after visit summary with patients personalized plan was offered to patient via MyChart   Notes: Nothing significant to report at this time.

## 2024-05-10 NOTE — Patient Instructions (Signed)
 Michelle Vasquez , Thank you for taking time out of your busy schedule to complete your Annual Wellness Visit with me. I enjoyed our conversation and look forward to speaking with you again next year. I, as well as your care team,  appreciate your ongoing commitment to your health goals. Please review the following plan we discussed and let me know if I can assist you in the future. Your Game plan/ To Do List    Referrals: If you haven't heard from the office you've been referred to, please reach out to them at the phone provided.  Patient complains of difficulty with hearing. ENT referral placed. Patient is in agreement with treatment plan. Aware that the office will call with an appointment.   Follow up Visits: Next Medicare AWV with our clinical staff: 05/11/25 @ 10:50am telelvisit   Have you seen your provider in the last 6 months (3 months if uncontrolled diabetes)? Yes Next Office Visit with your provider: 06/19/24 physical  Clinician Recommendations:  Aim for 30 minutes of exercise or brisk walking, 6-8 glasses of water, and 5 servings of fruits and vegetables each day.       This is a list of the screening recommended for you and due dates:  Health Maintenance  Topic Date Due   COVID-19 Vaccine (3 - Pfizer risk series) 07/26/2020   Colon Cancer Screening  07/27/2024   Flu Shot  05/26/2024   DTaP/Tdap/Td vaccine (3 - Td or Tdap) 06/11/2024   Mammogram  08/29/2024   Medicare Annual Wellness Visit  05/10/2025   Pneumococcal Vaccine for age over 51  Completed   Hepatitis B Vaccine  Completed   DEXA scan (bone density measurement)  Completed   Hepatitis C Screening  Completed   Zoster (Shingles) Vaccine  Completed   HPV Vaccine  Aged Out   Meningitis B Vaccine  Aged Out    Advanced directives: (Copy Requested) Please bring a copy of your health care power of attorney and living will to the office to be added to your chart at your convenience. You can mail to [facility] [address] [place], [place] [address] or email to [email or web address] Advance Care Planning is important because it:  [x]  Makes sure you receive the medical care that is consistent with your values, goals, and preferences  [x]  It provides guidance to your family and loved ones and reduces their decisional burden about whether or not they are making the right decisions based on your wishes.  Follow the link provided in your after visit summary or read over the paperwork we have mailed to you to help you started getting your Advance Directives in place. If you need assistance in completing these, please reach out to us  so that we can help you!

## 2024-05-16 DIAGNOSIS — L720 Epidermal cyst: Secondary | ICD-10-CM | POA: Diagnosis not present

## 2024-05-16 DIAGNOSIS — L728 Other follicular cysts of the skin and subcutaneous tissue: Secondary | ICD-10-CM | POA: Diagnosis not present

## 2024-05-16 DIAGNOSIS — D485 Neoplasm of uncertain behavior of skin: Secondary | ICD-10-CM | POA: Diagnosis not present

## 2024-05-16 DIAGNOSIS — R208 Other disturbances of skin sensation: Secondary | ICD-10-CM | POA: Diagnosis not present

## 2024-05-18 ENCOUNTER — Ambulatory Visit

## 2024-05-25 ENCOUNTER — Ambulatory Visit (INDEPENDENT_AMBULATORY_CARE_PROVIDER_SITE_OTHER): Admitting: *Deleted

## 2024-05-25 DIAGNOSIS — E538 Deficiency of other specified B group vitamins: Secondary | ICD-10-CM | POA: Diagnosis not present

## 2024-05-25 MED ORDER — CYANOCOBALAMIN 1000 MCG/ML IJ SOLN
1000.0000 ug | Freq: Once | INTRAMUSCULAR | Status: AC
  Administered 2024-05-25: 1000 ug via INTRAMUSCULAR

## 2024-05-25 NOTE — Progress Notes (Signed)
 Per orders of Mallie Gaskins, DPN AGNP-C, injection of B-12 given by Rayleen Forts M in left deltoid. Patient tolerated injection well. Patient will make appointment for 1 month. PCP is out of the office routing to provider in office

## 2024-05-30 ENCOUNTER — Ambulatory Visit: Admitting: Audiologist

## 2024-06-04 ENCOUNTER — Other Ambulatory Visit: Payer: Self-pay | Admitting: Family Medicine

## 2024-06-04 DIAGNOSIS — D508 Other iron deficiency anemias: Secondary | ICD-10-CM

## 2024-06-04 DIAGNOSIS — M818 Other osteoporosis without current pathological fracture: Secondary | ICD-10-CM

## 2024-06-04 DIAGNOSIS — K50118 Crohn's disease of large intestine with other complication: Secondary | ICD-10-CM

## 2024-06-04 DIAGNOSIS — E538 Deficiency of other specified B group vitamins: Secondary | ICD-10-CM

## 2024-06-04 DIAGNOSIS — I10 Essential (primary) hypertension: Secondary | ICD-10-CM

## 2024-06-04 DIAGNOSIS — E2740 Unspecified adrenocortical insufficiency: Secondary | ICD-10-CM

## 2024-06-04 DIAGNOSIS — E559 Vitamin D deficiency, unspecified: Secondary | ICD-10-CM

## 2024-06-06 DIAGNOSIS — E039 Hypothyroidism, unspecified: Secondary | ICD-10-CM | POA: Diagnosis not present

## 2024-06-06 DIAGNOSIS — E2740 Unspecified adrenocortical insufficiency: Secondary | ICD-10-CM | POA: Diagnosis not present

## 2024-06-07 LAB — TSH: TSH: 10.2 u[IU]/mL — ABNORMAL HIGH (ref 0.450–4.500)

## 2024-06-07 LAB — COMPREHENSIVE METABOLIC PANEL WITH GFR
ALT: 33 IU/L — ABNORMAL HIGH (ref 0–32)
AST: 31 IU/L (ref 0–40)
Albumin: 4.4 g/dL (ref 3.8–4.8)
Alkaline Phosphatase: 58 IU/L (ref 44–121)
BUN/Creatinine Ratio: 14 (ref 12–28)
BUN: 12 mg/dL (ref 8–27)
Bilirubin Total: 0.5 mg/dL (ref 0.0–1.2)
CO2: 20 mmol/L (ref 20–29)
Calcium: 9.2 mg/dL (ref 8.7–10.3)
Chloride: 105 mmol/L (ref 96–106)
Creatinine, Ser: 0.85 mg/dL (ref 0.57–1.00)
Globulin, Total: 2.3 g/dL (ref 1.5–4.5)
Glucose: 73 mg/dL (ref 70–99)
Potassium: 3.4 mmol/L — ABNORMAL LOW (ref 3.5–5.2)
Sodium: 143 mmol/L (ref 134–144)
Total Protein: 6.7 g/dL (ref 6.0–8.5)
eGFR: 72 mL/min/1.73 (ref >59)

## 2024-06-07 LAB — T4, FREE: Free T4: 1.26 ng/dL (ref 0.82–1.77)

## 2024-06-07 LAB — THYROID PEROXIDASE ANTIBODY: Thyroperoxidase Ab SerPl-aCnc: <9 [IU]/mL (ref 0–34)

## 2024-06-07 LAB — THYROID STIMULATING IMMUNOGLOBULIN: Thyroid Stim Immunoglobulin: <0.1 IU/L (ref 0.00–0.55)

## 2024-06-09 ENCOUNTER — Other Ambulatory Visit (INDEPENDENT_AMBULATORY_CARE_PROVIDER_SITE_OTHER)

## 2024-06-09 DIAGNOSIS — D508 Other iron deficiency anemias: Secondary | ICD-10-CM | POA: Diagnosis not present

## 2024-06-09 DIAGNOSIS — I10 Essential (primary) hypertension: Secondary | ICD-10-CM | POA: Diagnosis not present

## 2024-06-09 DIAGNOSIS — K50118 Crohn's disease of large intestine with other complication: Secondary | ICD-10-CM | POA: Diagnosis not present

## 2024-06-09 DIAGNOSIS — E559 Vitamin D deficiency, unspecified: Secondary | ICD-10-CM

## 2024-06-09 DIAGNOSIS — M818 Other osteoporosis without current pathological fracture: Secondary | ICD-10-CM

## 2024-06-09 DIAGNOSIS — E538 Deficiency of other specified B group vitamins: Secondary | ICD-10-CM

## 2024-06-09 DIAGNOSIS — E2740 Unspecified adrenocortical insufficiency: Secondary | ICD-10-CM

## 2024-06-09 LAB — IBC PANEL
Iron: 124 ug/dL (ref 42–145)
Saturation Ratios: 27 % (ref 20.0–50.0)
TIBC: 459.2 ug/dL — ABNORMAL HIGH (ref 250.0–450.0)
Transferrin: 328 mg/dL (ref 212.0–360.0)

## 2024-06-09 LAB — LIPID PANEL
Cholesterol: 162 mg/dL (ref 0–200)
HDL: 75.9 mg/dL (ref >39.00)
LDL Cholesterol: 66 mg/dL (ref 0–99)
NonHDL: 86.47
Total CHOL/HDL Ratio: 2
Triglycerides: 101 mg/dL (ref 0.0–149.0)
VLDL: 20.2 mg/dL (ref 0.0–40.0)

## 2024-06-09 LAB — COMPREHENSIVE METABOLIC PANEL WITH GFR
ALT: 36 U/L — ABNORMAL HIGH (ref 0–35)
AST: 27 U/L (ref 0–37)
Albumin: 4.2 g/dL (ref 3.5–5.2)
Alkaline Phosphatase: 51 U/L (ref 39–117)
BUN: 12 mg/dL (ref 6–23)
CO2: 29 meq/L (ref 19–32)
Calcium: 8.4 mg/dL (ref 8.4–10.5)
Chloride: 105 meq/L (ref 96–112)
Creatinine, Ser: 0.76 mg/dL (ref 0.40–1.20)
GFR: 77.53 mL/min (ref >60.00)
Glucose, Bld: 67 mg/dL — ABNORMAL LOW (ref 70–99)
Potassium: 3.5 meq/L (ref 3.5–5.1)
Sodium: 144 meq/L (ref 135–145)
Total Bilirubin: 0.7 mg/dL (ref 0.2–1.2)
Total Protein: 7 g/dL (ref 6.0–8.3)

## 2024-06-09 LAB — CBC WITH DIFFERENTIAL/PLATELET
Basophils Absolute: 0.1 K/uL (ref 0.0–0.1)
Basophils Relative: 0.6 % (ref 0.0–3.0)
Eosinophils Absolute: 0.3 K/uL (ref 0.0–0.7)
Eosinophils Relative: 2.9 % (ref 0.0–5.0)
HCT: 39.3 % (ref 36.0–46.0)
Hemoglobin: 13 g/dL (ref 12.0–15.0)
Lymphocytes Relative: 37.7 % (ref 12.0–46.0)
Lymphs Abs: 3.4 K/uL (ref 0.7–4.0)
MCHC: 33.1 g/dL (ref 30.0–36.0)
MCV: 95.3 fl (ref 78.0–100.0)
Monocytes Absolute: 0.6 K/uL (ref 0.1–1.0)
Monocytes Relative: 6.4 % (ref 3.0–12.0)
Neutro Abs: 4.7 K/uL (ref 1.4–7.7)
Neutrophils Relative %: 52.4 % (ref 43.0–77.0)
Platelets: 290 K/uL (ref 150.0–400.0)
RBC: 4.13 Mil/uL (ref 3.87–5.11)
RDW: 14.4 % (ref 11.5–15.5)
WBC: 8.9 K/uL (ref 4.0–10.5)

## 2024-06-09 LAB — VITAMIN D 25 HYDROXY (VIT D DEFICIENCY, FRACTURES): VITD: 17.78 ng/mL — ABNORMAL LOW (ref 30.00–100.00)

## 2024-06-09 LAB — VITAMIN B12: Vitamin B-12: 392 pg/mL (ref 211–911)

## 2024-06-09 LAB — FERRITIN: Ferritin: 52.3 ng/mL (ref 10.0–291.0)

## 2024-06-09 LAB — MAGNESIUM: Magnesium: 1.1 mg/dL — ABNORMAL LOW (ref 1.5–2.5)

## 2024-06-12 ENCOUNTER — Encounter: Payer: Self-pay | Admitting: "Endocrinology

## 2024-06-12 ENCOUNTER — Ambulatory Visit: Payer: Self-pay | Admitting: Family Medicine

## 2024-06-12 ENCOUNTER — Ambulatory Visit: Payer: HMO | Admitting: "Endocrinology

## 2024-06-12 VITALS — BP 128/74 | HR 64 | Ht 59.0 in | Wt 116.4 lb

## 2024-06-12 DIAGNOSIS — E039 Hypothyroidism, unspecified: Secondary | ICD-10-CM

## 2024-06-12 DIAGNOSIS — E559 Vitamin D deficiency, unspecified: Secondary | ICD-10-CM | POA: Diagnosis not present

## 2024-06-12 DIAGNOSIS — E2740 Unspecified adrenocortical insufficiency: Secondary | ICD-10-CM | POA: Diagnosis not present

## 2024-06-12 MED ORDER — LEVOTHYROXINE SODIUM 50 MCG PO TABS: 50.0000 ug | ORAL_TABLET | Freq: Every day | ORAL | 1 refills | Status: DC

## 2024-06-12 NOTE — Progress Notes (Signed)
 06/12/2024, 12:14 PM   Endocrinology follow-up note  Subjective:    Patient ID: Michelle Vasquez, female    DOB: [birth year], PCP [name], MD   Past Medical History:  Diagnosis Date   Allergy    seasonal   Anxiety and depression 07/03/2011   B12 deficiency    Blood transfusion without reported diagnosis    Cataract    Central hearing loss    COVID-19 virus infection 03/03/2021   Crohn disease (HCC)    s/p colectomy on , ?bacterial overgrowth   Facial cellulitis 01/15/2023   Fatty liver 12/15/2011   GERD (gastroesophageal reflux disease)    Heart murmur    History of small bowel obstruction    HTN (hypertension)    Internal hemorrhoids without mention of complication    Osteomalacia, unspecified    Perennial allergic rhinitis with seasonal variation    Squamous cell cancer of skin of left hand 11/2022   Dr Ninetta   Unspecified deficiency anemia    Unspecified polyarthropathy or polyarthritis, multiple sites    Past Surgical History:  Procedure Laterality Date   APPENDECTOMY     BIOPSY  07/28/2023   Procedure: BIOPSY;  Surgeon: Unk Corinn Skiff, MD;  Location: [facility];  Service: Gastroenterology;;   COLONOSCOPY  02/25/2005   Internal hemms; crohns; narrow anast   COLONOSCOPY  02/26/2010   Prior right hemicolectomy o/w benign (Dr. Obie)   COLONOSCOPY  02/2015   benign biopsies, crohn's with stenotic stricture at anastomosis Priscilla)   COLONOSCOPY  12/2019   small adenomas, crohn's disease small intestine, diverticulosis, non-patent end to side ileocolonic anastomosis with inflammation and ulceration biopsied, rpt 3 yrs (Brahmbhatt)   COLONOSCOPY WITH ESOPHAGOGASTRODUODENOSCOPY (EGD)  09/2023   COLONOSCOPY WITH PROPOFOL  N/A 07/28/2023   Procedure: COLONOSCOPY WITH PROPOFOL ;  Surgeon: Unk Corinn Skiff, MD;  Location: [facility];  Service: Gastroenterology;  Laterality: N/A;   DEXA  07/06/2011    normal T score -1.0 femur, spine   ESOPHAGOGASTRODUODENOSCOPY  02/25/2005   Dilated stricture   ESOPHAGOGASTRODUODENOSCOPY  10/2017   esophageal stenosis dilated, duodenal diverticuluc (Nandigam)   ESOPHAGOGASTRODUODENOSCOPY (EGD) WITH PROPOFOL  N/A 07/28/2023   Procedure: ESOPHAGOGASTRODUODENOSCOPY (EGD) WITH PROPOFOL ;  Surgeon: Unk Corinn Skiff, MD;  Location: [facility];  Service: Gastroenterology;  Laterality: N/A;   HEMICOLECTOMY  1980   all small intestine, none large   OTHER SURGICAL HISTORY  1960s, 1978, 1980   ileal resection x 3 and subtotal colon resection   POLYPECTOMY  07/28/2023   Procedure: POLYPECTOMY INTESTINAL;  Surgeon: Unk Corinn Skiff, MD;  Location: [facility];  Service: Gastroenterology;;   SMALL INTESTINE SURGERY     TOTAL ABDOMINAL HYSTERECTOMY  1975   ovaries out as well   UPPER GASTROINTESTINAL ENDOSCOPY     Social History   Socioeconomic History   Marital status: Divorced    Spouse name: Not on file   Number of children: 3   Years of education: Not on file   Highest education level: GED or equivalent  Occupational History   Occupation: Medical Transcription    Employer: OTHER    Comment: [employer]  Tobacco Use   Smoking status: Former    Current packs/day: 0.00    Average packs/day: 0.3 packs/day  for 15.0 years (3.8 ttl pk-yrs)    Types: Cigarettes    Start date: 10/27/1963    Quit date: 10/26/1978    Years since quitting: 45.6   Smokeless tobacco: Never  Vaping Use   Vaping status: Never Used  Substance and Sexual Activity   Alcohol use: No   Drug use: No   Sexual activity: Not on file  Other Topics Concern   Not on file  Social History Narrative   Caffeine: 2 cups coffee/day   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, no regular activity   Diet: good water, fruits/vegetables occasionally   Social Drivers of Corporate investment banker Strain: Low Risk  (05/10/2024)    Overall Financial Resource Strain (CARDIA)    Difficulty of Paying Living Expenses: Not hard at all  Food Insecurity: No Food Insecurity (05/10/2024)   Hunger Vital Sign    Worried About Running Out of Food in the Last Year: Never true    Ran Out of Food in the Last Year: Never true  Transportation Needs: No Transportation Needs (05/10/2024)   PRAPARE - Administrator, Civil Service (Medical): No    Lack of Transportation (Non-Medical): No  Physical Activity: Sufficiently Active (05/10/2024)   Exercise Vital Sign    Days of Exercise per Week: 5 days    Minutes of Exercise per Session: 30 min  Stress: No Stress Concern Present (05/10/2024)   [employer] of Occupational Health - Occupational Stress Questionnaire    Feeling of Stress: Only a little  Social Connections: Socially Isolated (05/10/2024)   Social Connection and Isolation Panel    Frequency of Communication with Friends and Family: Once a week    Frequency of Social Gatherings with Friends and Family: Never    Attends Religious Services: More than 4 times per year    Active Member of [employer] or Organizations: No    Attends Banker Meetings: Never    Marital Status: Widowed   Family History  Problem Relation Age of Onset   Crohn's disease Father        colostomy   Hypertension Mother    Heart disease Mother 72   Alcohol abuse Brother    Cirrhosis Brother    Crohn's disease Sister    Crohn's disease Paternal Aunt        x 3   Crohn's disease Other        neice x 2   Colon cancer Neg Hx    Cancer Neg Hx    Diabetes Neg Hx    Rectal cancer Neg Hx    Stomach cancer Neg Hx    Esophageal cancer Neg Hx    Breast cancer Neg Hx    Outpatient Encounter Medications as of 06/12/2024  Medication Sig   amLODipine  (NORVASC ) 10 MG tablet Take 1 tablet (10 mg total) by mouth daily.   calcium  carbonate (ANTACID CALCIUM ) 500 MG chewable tablet Chew 1 tablet (200 mg of elemental calcium  total) by mouth  daily.   Cholecalciferol  (VITAMIN D ) 50 MCG (2000 UT) CAPS Take 1 capsule (2,000 Units total) by mouth daily.   cyanocobalamin  (VITAMIN B12) 1000 MCG/ML injection Inject 1 mL (1,000 mcg total) into the skin every 30 (thirty) days.   folic acid  (FOLVITE ) 800 MCG tablet Take 1 tablet (800 mcg total) by mouth daily.   levothyroxine  (SYNTHROID ) 50 MCG tablet Take 1 tablet (50 mcg total) by mouth  daily before breakfast.   magnesium  chloride (SLOW-MAG) 64 MG TBEC SR tablet Take 2 tablets by mouth 2 (two) times daily.   pantoprazole  (PROTONIX ) 40 MG tablet TAKE ONE TABLET BY MOUTH ONCE DAILY   predniSONE  (DELTASONE ) 5 MG tablet TAKE ONE TABLET (5 MG TOTAL) BY MOUTH DAILY WITH BREAKFAST.   propranolol  (INDERAL ) 20 MG tablet TAKE ONE TABLET BY MOUTH TWICE A DAY   SYRINGE/NEEDLE, DISP, 1 ML 23G X 1 1 ML MISC Use as directed   Vedolizumab  (ENTYVIO  PEN) 108 MG/0.68ML SOAJ Inject 1 Pen into the skin every 14 (fourteen) days.   [DISCONTINUED] levothyroxine  (SYNTHROID ) 25 MCG tablet Take 1 tablet (25 mcg total) by mouth daily before breakfast.   Facility-Administered Encounter Medications as of 06/12/2024  Medication   denosumab  (PROLIA ) injection 60 mg   denosumab  (PROLIA ) injection 60 mg   [START ON 10/28/2024] denosumab  (PROLIA ) injection 60 mg   ALLERGIES: Allergies  Allergen Reactions   Celexa  [Citalopram  Hydrobromide] Other (See Comments)    Racing thoughts, couldn't sleep    VACCINATION STATUS: Immunization History  Administered Date(s) Administered   Fluad Quad(high Dose 65+) 09/07/2019, 11/25/2020, 12/12/2022   Fluad Trivalent(High Dose 65+) 07/30/2023   Hepatitis B 04/23/2013, 05/01/2013   Hepatitis B, PED/ADOLESCENT 09/25/2013   Hepb-cpg 11/03/2023, 12/01/2023   Influenza Whole 07/26/2008   Influenza,inj,Quad PF,6+ Mos 08/13/2014, 09/01/2018   Influenza-Unspecified 08/04/2016, 07/30/2017   PFIZER(Purple Top)SARS-COV-2 Vaccination 06/07/2020, 06/28/2020   Pneumococcal Conjugate-13  11/09/2017   Pneumococcal Polysaccharide-23 03/21/2013, 11/24/2019   Td 08/07/2004   Tdap 06/11/2014   Zoster Recombinant(Shingrix) 08/10/2023, 12/29/2023   Zoster, Live 08/24/2016    HPI Michelle Vasquez is 73 y.o. female who presents today with a medical history as above. she is being seen in follow-up after she was seen in consultation for hypocortisolemia requested by [name], MD.  - She reports that she was diagnosed with Crohn's disease at age 23.  She has received various modalities of treatment over the decades for it including high-dose steroids.  At times she has taken prednisone  as high as 80 mg daily. More recently she was dealing with symptoms including dizziness, disequilibrium, weight loss.   -After appropriate workup including ACTH  stimulation test confirmed primary adrenal insufficiency, she was started on prednisone  5 mg p.o. daily which has helped her achieve symptomatic improvement.  She has no new complaints today.    - She was also diagnosed with mild hypothyroidism for which she was initiated on low-dose levothyroxine  25 mcg p.o. daily before breakfast.   Her previsit labs show vitamin D  deficiency, hypomagnesemia, suboptimal thyroid  hormone replacement.  She is on medications including amlodipine , budesonide , vitamin D , vitamin B12, folic acid , magnesium  gluconate, mercaptopurine , Protonix , potassium supplements.   Review of Systems  Constitutional: + mildly fluctuating body weight gain, + fatigue, no subjective hyperthermia, no subjective hypothermia   Objective:       06/12/2024   10:13 AM 05/10/2024   10:53 AM 03/15/2024   10:53 AM  Vitals with BMI  Height 4' 11 4' 11 4' 11  Weight 116 lbs 6 oz 117 lbs 117 lbs  BMI 23.5 23.62 23.62  Systolic 128 -- 138  Diastolic 74 -- 70  Pulse 64  68    BP 128/74   Pulse 64   Ht 4' 11 (1.499 m)   Wt 116 lb 6.4 oz (52.8 kg)   BMI 23.51 kg/m   Wt Readings from Last 3 Encounters:  06/12/24 116 lb 6.4  oz (52.8 kg)  05/10/24 117 lb (53.1 kg)  03/15/24 117 lb (53.1 kg)    Physical Exam  Constitutional:  Body mass index is 23.51 kg/m.,  not in acute distress, normal state of mind Eyes: PERRLA, EOMI, no exophthalmos ENT: moist mucous membranes, no gross thyromegaly, no gross cervical lymphadenopathy   CMP ( most recent) CMP     Component Value Date/Time   NA 144 06/09/2024 0801   NA 143 06/06/2024 0847   K 3.5 06/09/2024 0801   CL 105 06/09/2024 0801   CO2 29 06/09/2024 0801   GLUCOSE 67 (L) 06/09/2024 0801   BUN 12 06/09/2024 0801   BUN 12 06/06/2024 0847   CREATININE 0.76 06/09/2024 0801   CREATININE 0.84 10/02/2022 1537   CALCIUM  8.4 06/09/2024 0801   PROT 7.0 06/09/2024 0801   PROT 6.7 06/06/2024 0847   ALBUMIN 4.2 06/09/2024 0801   ALBUMIN 4.4 06/06/2024 0847   AST 27 06/09/2024 0801   ALT 36 (H) 06/09/2024 0801   ALKPHOS 51 06/09/2024 0801   BILITOT 0.7 06/09/2024 0801   BILITOT 0.5 06/06/2024 0847   GFRNONAA >60 12/12/2022 0639   GFRAA >60 11/21/2019 1238     Diabetic Labs (most recent): Lab Results  Component Value Date   MICROALBUR 0.7 01/06/2024     Lipid Panel ( most recent) Lipid Panel     Component Value Date/Time   CHOL 162 06/09/2024 0801   CHOL 180 12/10/2023 0829   TRIG 101.0 06/09/2024 0801   HDL 75.90 06/09/2024 0801   HDL 94 12/10/2023 0829   CHOLHDL 2 06/09/2024 0801   VLDL 20.2 06/09/2024 0801   LDLCALC 66 06/09/2024 0801   LDLCALC 73 12/10/2023 0829   LABVLDL 13 12/10/2023 0829      Lab Results  Component Value Date   TSH 10.200 (H) 06/06/2024   TSH 1.91 02/24/2024   TSH 6.510 (H) 12/10/2023   TSH 4.070 06/07/2023   TSH 15.33 (H) 05/26/2023   TSH 6.52 (H) 04/20/2023   TSH 2.13 03/20/2022   TSH 3.59 01/31/2018   TSH 2.81 06/04/2014   TSH 3.29 04/09/2010   FREET4 1.26 06/06/2024   FREET4 1.11 12/10/2023   FREET4 1.11 06/07/2023   FREET4 0.74 05/26/2023    ACTH  stimulation test on February 05, 2023  Latest Reference  Range & Units 01/26/23 09:49  Cortisol, Base ug/dL <9.5  Cortisol, 30 Min ug/dL 3.8  Cortisol, 60 Min ug/dL 5.0    Assessment & Plan:   1. Adrenal insufficiency (HCC) 2.  Hypothyroidism 3.  Hypomagnesemia 4.  Vitamin D  deficiency Her recent workup confirmed primary adrenal insufficiency  likely related to her chronic steroids exposure.   She is responding to prednisone  5 mg p.o. daily.  She is advised to continue only with prednisone , will not need mineralocorticoid replacement at this time  She will be reassessed with CMP next visit.  She is advised to wear medical alert indicating her diagnosis of adrenal insufficiency.    Sick day steroids rule was discussed with her.    Her previsit thyroid  function tests are consistent with slight under replacement.  I discussed and increased her levothyroxine  to 50 mcg p.o. daily before breakfast.   - We discussed about the correct intake of her thyroid  hormone, on empty stomach at fasting, with water, separated by at least 30 minutes from breakfast and other medications,  and separated by more than 4 hours from calcium , iron , multivitamins, acid reflux medications (PPIs). -Patient is made aware of the fact that thyroid   hormone replacement is needed for life, dose to be adjusted by periodic monitoring of thyroid  function tests.  I advised her to continue on her vitamin D  2000 units daily, and her recent prescription for magnesium  until next measurement. She is on Prolia  treatment arranged at her PCP office.  She is due for her next bone density in November 2026.  She is advised to continue Prolia  intervention without interruption.     - she is advised to maintain close follow up with [name], MD for primary care needs.  I spent  26  minutes in the care of the patient today including review of labs from Thyroid  Function, CMP, and other relevant labs ; imaging/biopsy records (current and previous including abstractions from other  facilities); face-to-face time discussing  her lab results and symptoms, medications doses, her options of short and long term treatment based on the latest standards of care / guidelines;   and documenting the encounter.  Michelle Vasquez  participated in the discussions, expressed understanding, and voiced agreement with the above plans.  All questions were answered to her satisfaction. she is encouraged to contact clinic should she have any questions or concerns prior to her return visit.   Follow up plan: Return in about 6 months (around 12/13/2024) for F/U with Pre-visit Labs.   Ranny Earl, MD [facility] [address] [place], [place] [address] Phone: [phone]  Fax: [phone]     06/12/2024, 12:14 PM  This note was partially dictated with voice recognition software. Similar sounding words can be transcribed inadequately or may not  be corrected upon review.

## 2024-06-19 ENCOUNTER — Encounter: Payer: Self-pay | Admitting: Family Medicine

## 2024-06-19 ENCOUNTER — Ambulatory Visit (INDEPENDENT_AMBULATORY_CARE_PROVIDER_SITE_OTHER): Admitting: Family Medicine

## 2024-06-19 ENCOUNTER — Ambulatory Visit
Admission: RE | Admit: 2024-06-19 | Discharge: 2024-06-19 | Disposition: A | Discharge status: Home / Self Care | Source: Ambulatory Visit | Attending: Family Medicine | Admitting: Family Medicine

## 2024-06-19 VITALS — BP 122/64 | HR 67 | Temp 98.8°F | Ht 59.5 in | Wt 114.5 lb

## 2024-06-19 DIAGNOSIS — R251 Tremor, unspecified: Secondary | ICD-10-CM

## 2024-06-19 DIAGNOSIS — I10 Essential (primary) hypertension: Secondary | ICD-10-CM

## 2024-06-19 DIAGNOSIS — E039 Hypothyroidism, unspecified: Secondary | ICD-10-CM | POA: Diagnosis not present

## 2024-06-19 DIAGNOSIS — G8929 Other chronic pain: Secondary | ICD-10-CM | POA: Diagnosis not present

## 2024-06-19 DIAGNOSIS — F3342 Major depressive disorder, recurrent, in full remission: Secondary | ICD-10-CM | POA: Diagnosis not present

## 2024-06-19 DIAGNOSIS — D508 Other iron deficiency anemias: Secondary | ICD-10-CM

## 2024-06-19 DIAGNOSIS — M4185 Other forms of scoliosis, thoracolumbar region: Secondary | ICD-10-CM | POA: Diagnosis not present

## 2024-06-19 DIAGNOSIS — M5441 Lumbago with sciatica, right side: Secondary | ICD-10-CM

## 2024-06-19 DIAGNOSIS — K50118 Crohn's disease of large intestine with other complication: Secondary | ICD-10-CM | POA: Diagnosis not present

## 2024-06-19 DIAGNOSIS — M818 Other osteoporosis without current pathological fracture: Secondary | ICD-10-CM

## 2024-06-19 DIAGNOSIS — G4700 Insomnia, unspecified: Secondary | ICD-10-CM | POA: Diagnosis not present

## 2024-06-19 DIAGNOSIS — E2740 Unspecified adrenocortical insufficiency: Secondary | ICD-10-CM

## 2024-06-19 DIAGNOSIS — Z0001 Encounter for general adult medical examination with abnormal findings: Secondary | ICD-10-CM | POA: Diagnosis not present

## 2024-06-19 DIAGNOSIS — M545 Low back pain, unspecified: Secondary | ICD-10-CM | POA: Diagnosis not present

## 2024-06-19 DIAGNOSIS — Z7189 Other specified counseling: Secondary | ICD-10-CM

## 2024-06-19 DIAGNOSIS — E538 Deficiency of other specified B group vitamins: Secondary | ICD-10-CM | POA: Diagnosis not present

## 2024-06-19 DIAGNOSIS — E559 Vitamin D deficiency, unspecified: Secondary | ICD-10-CM

## 2024-06-19 LAB — MAGNESIUM: Magnesium: 1.7 mg/dL (ref 1.5–2.5)

## 2024-06-19 MED ORDER — PANTOPRAZOLE SODIUM 40 MG PO TBEC: 40.0000 mg | DELAYED_RELEASE_TABLET | Freq: Every day | ORAL | 3 refills | Status: AC

## 2024-06-19 MED ORDER — TRAZODONE HCL 50 MG PO TABS: 25.0000 mg | ORAL_TABLET | Freq: Every evening | ORAL | 3 refills | Status: AC | PRN

## 2024-06-19 MED ORDER — PROPRANOLOL HCL 20 MG PO TABS: 20.0000 mg | ORAL_TABLET | Freq: Two times a day (BID) | ORAL | 3 refills | Status: AC

## 2024-06-19 MED ORDER — AMLODIPINE BESYLATE 10 MG PO TABS: 10.0000 mg | ORAL_TABLET | Freq: Every day | ORAL | 3 refills | Status: AC

## 2024-06-19 MED ORDER — CYANOCOBALAMIN 1000 MCG/ML IJ SOLN
1000.0000 ug | Freq: Once | INTRAMUSCULAR | Status: AC
  Administered 2024-06-19: 1000 ug via INTRAMUSCULAR

## 2024-06-19 NOTE — Assessment & Plan Note (Signed)
 Chronic, stable. Continue current regimen.

## 2024-06-19 NOTE — Assessment & Plan Note (Addendum)
 Levels low - she has increased magnesium  to 2 pills BID - continue higher dose. Update Mg levels.

## 2024-06-19 NOTE — Patient Instructions (Addendum)
 Take pantoprazole  30 min before dinner. Call Dr Unk for sooner appt.  Try trazodone  25-50mg  nightly for sleep.  B12 shot today then continue monthly b12 shots (nurse visit).  Recheck magnesium  levels today  Low back xrays today  Return in 3-4 months for follow up visit

## 2024-06-19 NOTE — Assessment & Plan Note (Signed)
 Acute on chronic worsening of R low back pain for last few months, with stiffness after prolonged sitting, and pain radiating to R buttock/hip.  Not consistent with hip arthritis.  Update lumbar films today.

## 2024-06-19 NOTE — Progress Notes (Signed)
 Ph: [phone] Fax: [phone]   Patient ID: Michelle Vasquez, female    DOB: [birth year], 73 y.o.   MRN: [id number]  This visit was conducted in person.  BP 122/64   Pulse 67   Temp 98.8 F (37.1 C) (Oral)   Ht 4' 11.5 (1.511 m)   Wt 114 lb 8 oz (51.9 kg)   SpO2 96%   BMI 22.74 kg/m    CC: CPE Subjective:   HPI: Michelle Vasquez is a 73 y.o. female presenting on 06/19/2024 for Annual Exam   Saw health advisor 04/2024 for medicare wellness visit. Note reviewed.   No results found.  Flowsheet Row Office Visit from 06/19/2024 in [facility] [place]  PHQ-2 Total Score 4       06/19/2024   11:40 AM 05/10/2024   10:58 AM 03/15/2024   11:01 AM 02/24/2024    3:02 PM 12/14/2023    2:08 PM  Fall Risk   Falls in the past year? 0 0 0 0 0  Number falls in past yr: 0 0  0   Injury with Fall? 0 0  0   Risk for fall due to : No Fall Risks No Fall Risks  No Fall Risks   Follow up Falls evaluation completed Education provided;Falls prevention discussed  Falls evaluation completed     Crohn's - sees [facility] Dr Unk Bout last night with acid reflux and diarrhea. She is on Entyvio  - each shot causes diarrhea x1 wk. Next appt with GI is 07/2024. Planning to try and get in sooner. Had significant dysphagia - throat on fire even with sips of water. This is despite pantoprazole  40mg  daily PRN.  Continues SlowMag/calcium  71.5/119mg  2 tab BID.    Secondary adrenal insufficiency followed by endocrinology, now on prednisone  5mg  daily. Notes trouble sleeping. BP also elevated since starting this.  R lower back pain for several months. Acutely worse when standing from prolonged sitting. She does have shooting pain down R buttock to R lateral hip.    Preventative: COLONOSCOPY 12/2019 - small adenomas, crohn's disease small intestine, diverticulosis, non-patent end to side ileocolonic anastomosis with inflammation and ulceration biopsied (no dysplasia)  ([place]) Colonoscopy 07/2023 - multiple TAs, anastomosis, rpt 1 yr ([place]) Well woman - s/p total hysterectomy for fibroids, ovaries removed at age 69yo. Took hormone replacement only for a few months, stopped early unclear reason.  Mammogram 08/2023 - Birads1 @ [place]. She does breast exams at home. No fmhx breast cancer.  DEXA - spine -1.7, hip -1.6 02/2018. Discussed calcium  in diet (limited due to crohn's), calcium  supplement, and vitamin D  supplement, and regular weight bearing exercise.  DEXA 08/2023 - T -2.9 spine and L forearm  Prolia  started 09/2024, latest injection 04/27/2024 Lung cancer screening - not eligible  Flu shot yearly COVID vaccine Pfizer 05/2020, 06/2020, no booster  Tdap 2005, 2015 Pneumovax 2014. Prevnar-13 2019. Pneumovax 10/2019 zostavax - 2017 Shingrix - 07/2023, 12/2023  Completed Hep B series.  Advanced directive - completed at home. Wants daughter to be HCPOA, asked to bring us  a copy.  Seat belt use discussed.  Sunscreen use discussed. No suspicious moles. Sees derm regularly.  Ex smoker quit remotely - but daughter smokes outdoors Alcohol - none  Dentist q6 mo  Eye exam yearly  Bowel - h/o crohn's with loose stools, no constipation  Bladder - no incontinence   Caffeine: 2 cups coffee/day   Divorced 1989   3 children  Occ: Museum/gallery exhibitions officer, working at [employer] in [place]  Activity: walks dog 1 mi 4-5 times a week.  Diet: good water, fruits/vegetables ok     Relevant past medical, surgical, family and social history reviewed and updated as indicated. Interim medical history since our last visit reviewed. Allergies and medications reviewed and updated. Outpatient Medications Prior to Visit  Medication Sig Dispense Refill   calcium  carbonate (ANTACID CALCIUM ) 500 MG chewable tablet Chew 1 tablet (200 mg of elemental calcium  total) by mouth daily.     cyanocobalamin  (VITAMIN B12) 1000 MCG/ML injection Inject 1 mL (1,000 mcg total) into  the skin every 30 (thirty) days. 10 mL 1   folic acid  (FOLVITE ) 800 MCG tablet Take 1 tablet (800 mcg total) by mouth daily.     levothyroxine  (SYNTHROID ) 50 MCG tablet Take 1 tablet (50 mcg total) by mouth daily before breakfast. 90 tablet 1   magnesium  chloride (SLOW-MAG) 64 MG TBEC SR tablet Take 2 tablets by mouth 2 (two) times daily.     predniSONE  (DELTASONE ) 5 MG tablet TAKE ONE TABLET (5 MG TOTAL) BY MOUTH DAILY WITH BREAKFAST. 90 tablet 1   SYRINGE/NEEDLE, DISP, 1 ML 23G X 1 1 ML MISC Use as directed 8 each 0   Vedolizumab  (ENTYVIO  PEN) 108 MG/0.68ML SOAJ Inject 1 Pen into the skin every 14 (fourteen) days. 4.08 mL 3   amLODipine  (NORVASC ) 10 MG tablet Take 1 tablet (10 mg total) by mouth daily. 90 tablet 3   Cholecalciferol  (VITAMIN D ) 50 MCG (2000 UT) CAPS Take 1 capsule (2,000 Units total) by mouth daily. 90 capsule 3   pantoprazole  (PROTONIX ) 40 MG tablet TAKE ONE TABLET BY MOUTH ONCE DAILY 90 tablet 3   propranolol  (INDERAL ) 20 MG tablet TAKE ONE TABLET BY MOUTH TWICE A DAY 60 tablet 4   Facility-Administered Medications Prior to Visit  Medication Dose Route Frequency Provider Last Rate Last Admin   denosumab  (PROLIA ) injection 60 mg  60 mg Subcutaneous Once Press Casale, MD       denosumab  (PROLIA ) injection 60 mg  60 mg Subcutaneous Q6 months Michelle Baller, MD       NOREEN ON 10/28/2024] denosumab  (PROLIA ) injection 60 mg  60 mg Subcutaneous Once Duncan, Graham S, MD         Per HPI unless specifically indicated in ROS section below Review of Systems  Constitutional:  Positive for chills (intermittent). Negative for activity change, appetite change, fatigue, fever and unexpected weight change.  HENT:  Negative for hearing loss.   Eyes:  Negative for visual disturbance.  Respiratory:  Negative for cough, chest tightness, shortness of breath and wheezing.   Cardiovascular:  Negative for chest pain, palpitations and leg swelling.  Gastrointestinal:  Positive for  abdominal pain and diarrhea. Negative for abdominal distention, blood in stool, constipation, nausea and vomiting.  Genitourinary:  Negative for difficulty urinating and hematuria.  Musculoskeletal:  Positive for back pain. Negative for arthralgias, myalgias and neck pain.  Skin:  Negative for rash.  Neurological:  Negative for dizziness, seizures, syncope and headaches.  Hematological:  Negative for adenopathy. Does not bruise/bleed easily.  Psychiatric/Behavioral:  Negative for dysphoric mood. The patient is nervous/anxious.     Objective:  BP 122/64   Pulse 67   Temp 98.8 F (37.1 C) (Oral)   Ht 4' 11.5 (1.511 m)   Wt 114 lb 8 oz (51.9 kg)   SpO2 96%   BMI 22.74 kg/m   Wt Readings from Last 3 Encounters:  06/19/24 114 lb 8 oz (51.9 kg)  06/12/24 116 lb 6.4 oz (52.8 kg)  05/10/24 117 lb (53.1 kg)      Physical Exam Vitals and nursing note reviewed.  Constitutional:      Appearance: Normal appearance. She is not ill-appearing.  HENT:     Head: Normocephalic and atraumatic.     Right Ear: Tympanic membrane, ear canal and external ear normal. There is no impacted cerumen.     Left Ear: Tympanic membrane, ear canal and external ear normal. There is no impacted cerumen.     Mouth/Throat:     Mouth: Mucous membranes are moist.     Pharynx: Oropharynx is clear. No oropharyngeal exudate or posterior oropharyngeal erythema.  Eyes:     General:        Right eye: No discharge.        Left eye: No discharge.     Extraocular Movements: Extraocular movements intact.     Conjunctiva/sclera: Conjunctivae normal.     Pupils: Pupils are equal, round, and reactive to light.  Neck:     Thyroid : No thyroid  mass or thyromegaly.     Vascular: No carotid bruit.  Cardiovascular:     Rate and Rhythm: Normal rate and regular rhythm.     Pulses: Normal pulses.     Heart sounds: Normal heart sounds. No murmur heard. Pulmonary:     Effort: Pulmonary effort is normal. No respiratory distress.      Breath sounds: Normal breath sounds. No wheezing, rhonchi or rales.  Abdominal:     General: Bowel sounds are normal. There is no distension.     Palpations: Abdomen is soft. There is no mass.     Tenderness: There is no abdominal tenderness. There is no guarding or rebound.     Hernia: No hernia is present.  Musculoskeletal:     Cervical back: Normal range of motion and neck supple. No rigidity.     Right lower leg: No edema.     Left lower leg: No edema.     Comments:  No pain midline spine No paraspinous mm tenderness Neg SLR bilaterally. No pain with int/ext rotation at hip. Neg FABER. + pain at L SIJ, R GTB. No pain at sciatic notch bilaterally.   Lymphadenopathy:     Cervical: No cervical adenopathy.  Skin:    General: Skin is warm and dry.     Findings: No rash.  Neurological:     General: No focal deficit present.     Mental Status: She is alert. Mental status is at baseline.     Comments:  5/5 strength BLE  Psychiatric:        Mood and Affect: Mood normal.        Behavior: Behavior normal.       Results for orders placed or performed in visit on 06/09/24  Magnesium    Collection Time: 06/09/24  8:01 AM  Result Value Ref Range   Magnesium  1.1 (L) 1.5 - 2.5 mg/dL  Vitamin B12   Collection Time: 06/09/24  8:01 AM  Result Value Ref Range   Vitamin B-12 392 211 - 911 pg/mL  IBC panel   Collection Time: 06/09/24  8:01 AM  Result Value Ref Range   Iron  124 42 - 145 ug/dL   Transferrin 671.9 787.9 - 360.0 mg/dL   Saturation Ratios 72.9 20.0 - 50.0 %   TIBC 459.2 (H) 250.0 - 450.0 mcg/dL  Ferritin   Collection Time: 06/09/24  8:01  AM  Result Value Ref Range   Ferritin 52.3 10.0 - 291.0 ng/mL  VITAMIN D  25 Hydroxy (Vit-D Deficiency, Fractures)   Collection Time: 06/09/24  8:01 AM  Result Value Ref Range   VITD 17.78 (L) 30.00 - 100.00 ng/mL  CBC with Differential/Platelet   Collection Time: 06/09/24  8:01 AM  Result Value Ref Range   WBC 8.9 4.0 - 10.5  K/uL   RBC 4.13 3.87 - 5.11 Mil/uL   Hemoglobin 13.0 12.0 - 15.0 g/dL   HCT 60.6 63.9 - 53.9 %   MCV 95.3 78.0 - 100.0 fl   MCHC 33.1 30.0 - 36.0 g/dL   RDW 85.5 88.4 - 84.4 %   Platelets 290.0 150.0 - 400.0 K/uL   Neutrophils Relative % 52.4 43.0 - 77.0 %   Lymphocytes Relative 37.7 12.0 - 46.0 %   Monocytes Relative 6.4 3.0 - 12.0 %   Eosinophils Relative 2.9 0.0 - 5.0 %   Basophils Relative 0.6 0.0 - 3.0 %   Neutro Abs 4.7 1.4 - 7.7 K/uL   Lymphs Abs 3.4 0.7 - 4.0 K/uL   Monocytes Absolute 0.6 0.1 - 1.0 K/uL   Eosinophils Absolute 0.3 0.0 - 0.7 K/uL   Basophils Absolute 0.1 0.0 - 0.1 K/uL  Comprehensive metabolic panel with GFR   Collection Time: 06/09/24  8:01 AM  Result Value Ref Range   Sodium 144 135 - 145 mEq/L   Potassium 3.5 3.5 - 5.1 mEq/L   Chloride 105 96 - 112 mEq/L   CO2 29 19 - 32 mEq/L   Glucose, Bld 67 (L) 70 - 99 mg/dL   BUN 12 6 - 23 mg/dL   Creatinine, Ser 9.23 0.40 - 1.20 mg/dL   Total Bilirubin 0.7 0.2 - 1.2 mg/dL   Alkaline Phosphatase 51 39 - 117 U/L   AST 27 0 - 37 U/L   ALT 36 (H) 0 - 35 U/L   Total Protein 7.0 6.0 - 8.3 g/dL   Albumin 4.2 3.5 - 5.2 g/dL   GFR 22.46 >39.99 mL/min   Calcium  8.4 8.4 - 10.5 mg/dL  Lipid panel   Collection Time: 06/09/24  8:01 AM  Result Value Ref Range   Cholesterol 162 0 - 200 mg/dL   Triglycerides 898.9 0.0 - 149.0 mg/dL   HDL 24.09 >60.99 mg/dL   VLDL 79.7 0.0 - 59.9 mg/dL   LDL Cholesterol 66 0 - 99 mg/dL   Total CHOL/HDL Ratio 2    NonHDL 86.47       06/19/2024   11:40 AM 05/10/2024   11:05 AM 03/15/2024   11:01 AM 02/24/2024    3:02 PM 12/14/2023    2:09 PM  Depression screen PHQ 2/9  Decreased Interest 2 0 0 0 0  Down, Depressed, Hopeless 2 0 0 1 1  PHQ - 2 Score 4 0 0 1 1  Altered sleeping 2  2  1   Tired, decreased energy 2  0  1  Change in appetite 0  0  0  Feeling bad or failure about yourself  2  0  1  Trouble concentrating 1  0  0  Moving slowly or fidgety/restless 0  0  0  Suicidal  thoughts 0  0  0  PHQ-9 Score 11  2  4   Difficult doing work/chores Somewhat difficult    Not difficult at all      06/19/2024   11:41 AM 03/15/2024   11:02 AM 12/14/2023    2:09 PM  09/10/2023   11:18 AM  GAD 7 : Generalized Anxiety Score  Nervous, Anxious, on Edge 2 1 2  0  Control/stop worrying 2 1 1  0  Worry too much - different things 1 1 1  0  Trouble relaxing 2 0 0 0  Restless 1 1 0 0  Easily annoyed or irritable 1 1 0 0  Afraid - awful might happen 3 3 2  0  Total GAD 7 Score 12 8 6  0  Anxiety Difficulty Somewhat difficult Somewhat difficult     Assessment & Plan:   Problem List Items Addressed This Visit     Crohn's disease (HCC) (Chronic)   Appreciate GI care now on Entyvio  - she will follow up with GI for worsening reflux despite pantoprazole  40mg  daily Consider Voquenza.       Encounter for general adult medical examination with abnormal findings - Primary (Chronic)   Preventative protocols reviewed and updated unless pt declined. Discussed healthy diet and lifestyle.       Advanced directives, counseling/discussion (Chronic)   Asked to bring us  notarized copy      Adrenal insufficiency (HCC) (Chronic)   Appreciate endo care -continue low dose prednisone        Vitamin B12 deficiency   B12 shot today.       Iron  deficiency anemia   Iron  levels overall ok, with ferritin 50s.  Intolerant of oral iron , has not had iron  infusion.       Essential hypertension   Chronic, stable. Continue current regimen.       Relevant Medications   propranolol  (INDERAL ) 20 MG tablet   amLODipine  (NORVASC ) 10 MG tablet   MDD (major depressive disorder), recurrent, in full remission (HCC)   Chronic, deteriorated with anxiety > depression.  H/o intolerance to SSRIs. Start trazodone  as per above.       Relevant Medications   traZODone  (DESYREL ) 50 MG tablet   Tremor   Presumed ET, present since 2019.  Continue propranolol .       Osteoporosis   Tolerating prolia  -  continue this.       Relevant Medications   Vitamin D , Ergocalciferol , (DRISDOL ) 1.25 MG (50000 UNIT) CAPS capsule   Insomnia   Retry trazodone  25-50mg  nightly for sleep       Vitamin D  deficiency   Irregular with vit D replacement - start Rx vit D 50k units weekly.       Hypomagnesemia   Levels low - she has increased magnesium  to 2 pills BID - continue higher dose. Update Mg levels.      Relevant Orders   Magnesium    Hypothyroidism   Chronic, stable on levothyroxine  50mcg daily - dose recently increased by endo       Relevant Medications   propranolol  (INDERAL ) 20 MG tablet   Chronic right-sided low back pain with right-sided sciatica   Acute on chronic worsening of R low back pain for last few months, with stiffness after prolonged sitting, and pain radiating to R buttock/hip.  Not consistent with hip arthritis.  Update lumbar films today.       Relevant Medications   traZODone  (DESYREL ) 50 MG tablet   Other Relevant Orders   DG Lumbar Spine Complete     Meds ordered this encounter  Medications   pantoprazole  (PROTONIX ) 40 MG tablet    Sig: Take 1 tablet (40 mg total) by mouth daily.    Dispense:  90 tablet    Refill:  3   propranolol  (INDERAL ) 20 MG  tablet    Sig: Take 1 tablet (20 mg total) by mouth 2 (two) times daily.    Dispense:  180 tablet    Refill:  3   traZODone  (DESYREL ) 50 MG tablet    Sig: Take 0.5-1 tablets (25-50 mg total) by mouth at bedtime as needed for sleep.    Dispense:  30 tablet    Refill:  3   amLODipine  (NORVASC ) 10 MG tablet    Sig: Take 1 tablet (10 mg total) by mouth daily.    Dispense:  90 tablet    Refill:  3   cyanocobalamin  (VITAMIN B12) injection 1,000 mcg   Vitamin D , Ergocalciferol , (DRISDOL ) 1.25 MG (50000 UNIT) CAPS capsule    Sig: Take 1 capsule (50,000 Units total) by mouth every 7 (seven) days.    Dispense:  12 capsule    Refill:  3    Orders Placed This Encounter  Procedures   DG Lumbar Spine Complete     Standing Status:   Future    Number of Occurrences:   1    Expiration Date:   06/19/2025    Reason for Exam (SYMPTOM  OR DIAGNOSIS REQUIRED):   R low back pain with radiation to hip/buttock    Preferred imaging location?:   [facility]   Magnesium     Patient Instructions  Take pantoprazole  30 min before dinner. Call Dr Unk for sooner appt.  Try trazodone  25-50mg  nightly for sleep.  B12 shot today then continue monthly b12 shots (nurse visit).  Recheck magnesium  levels today  Low back xrays today  Return in 3-4 months for follow up visit   Follow up plan: Return in about 4 months (around 10/19/2024), or if symptoms worsen or fail to improve, for follow up visit.  Anton Blas, MD

## 2024-06-19 NOTE — Assessment & Plan Note (Signed)
 Tolerating prolia  - continue this.

## 2024-06-19 NOTE — Assessment & Plan Note (Signed)
 Iron  levels overall ok, with ferritin 50s.  Intolerant of oral iron , has not had iron  infusion.

## 2024-06-19 NOTE — Assessment & Plan Note (Signed)
 Appreciate GI care now on Entyvio  - she will follow up with GI for worsening reflux despite pantoprazole  40mg  daily Consider Voquenza.

## 2024-06-19 NOTE — Assessment & Plan Note (Signed)
 Preventative protocols reviewed and updated unless pt declined. Discussed healthy diet and lifestyle.

## 2024-06-19 NOTE — Assessment & Plan Note (Signed)
 Retry trazodone  25-50mg  nightly for sleep

## 2024-06-19 NOTE — Assessment & Plan Note (Signed)
 B12 shot today.

## 2024-06-19 NOTE — Assessment & Plan Note (Signed)
 Appreciate endo care -continue low dose prednisone 

## 2024-06-19 NOTE — Assessment & Plan Note (Signed)
 Asked to bring us  notarized copy

## 2024-06-19 NOTE — Assessment & Plan Note (Signed)
 Chronic, stable on levothyroxine  50mcg daily - dose recently increased by endo

## 2024-06-20 ENCOUNTER — Ambulatory Visit: Payer: Self-pay | Admitting: Family Medicine

## 2024-06-20 MED ORDER — VITAMIN D (ERGOCALCIFEROL) 1.25 MG (50000 UNIT) PO CAPS: 50000.0000 [IU] | ORAL_CAPSULE | ORAL | 3 refills | Status: AC

## 2024-06-20 NOTE — Assessment & Plan Note (Signed)
 Chronic, deteriorated with anxiety > depression.  H/o intolerance to SSRIs. Start trazodone  as per above.

## 2024-06-20 NOTE — Assessment & Plan Note (Addendum)
 Irregular with vit D replacement - start Rx vit D 50k units weekly.

## 2024-06-20 NOTE — Assessment & Plan Note (Addendum)
 Presumed ET, present since 2019.  Continue propranolol .

## 2024-06-21 ENCOUNTER — Encounter: Payer: Self-pay | Admitting: Family Medicine

## 2024-06-21 ENCOUNTER — Ambulatory Visit: Payer: Self-pay | Admitting: *Deleted

## 2024-06-21 ENCOUNTER — Ambulatory Visit (INDEPENDENT_AMBULATORY_CARE_PROVIDER_SITE_OTHER): Admitting: Family Medicine

## 2024-06-21 VITALS — BP 112/64 | HR 58 | Temp 97.8°F | Ht 59.5 in | Wt 115.1 lb

## 2024-06-21 DIAGNOSIS — E2740 Unspecified adrenocortical insufficiency: Secondary | ICD-10-CM | POA: Diagnosis not present

## 2024-06-21 DIAGNOSIS — R1030 Lower abdominal pain, unspecified: Secondary | ICD-10-CM

## 2024-06-21 DIAGNOSIS — R350 Frequency of micturition: Secondary | ICD-10-CM | POA: Diagnosis not present

## 2024-06-21 DIAGNOSIS — K50118 Crohn's disease of large intestine with other complication: Secondary | ICD-10-CM | POA: Diagnosis not present

## 2024-06-21 DIAGNOSIS — R300 Dysuria: Secondary | ICD-10-CM | POA: Diagnosis not present

## 2024-06-21 LAB — POC URINALSYSI DIPSTICK (AUTOMATED)
Bilirubin, UA: NEGATIVE
Glucose, UA: NEGATIVE
Ketones, UA: NEGATIVE
Leukocytes, UA: NEGATIVE
Nitrite, UA: NEGATIVE
Protein, UA: NEGATIVE
Spec Grav, UA: 1.01 (ref 1.010–1.025)
Urobilinogen, UA: 0.2 U/dL
pH, UA: 6 (ref 5.0–8.0)

## 2024-06-21 NOTE — Progress Notes (Addendum)
 Ph: [phone] Fax: [phone]   Patient ID: Michelle Vasquez, female    DOB: 31-Dec-1950, 73 y.o.   MRN: [id number]  This visit was conducted in person.  BP 112/64   Pulse (!) 58   Temp 97.8 F (36.6 C) (Oral)   Ht 4' 11.5 (1.511 m)   Wt 115 lb 2 oz (52.2 kg)   SpO2 97%   BMI 22.86 kg/m    CC: UTI  Subjective:   HPI: Michelle Vasquez is a 73 y.o. female presenting on 06/21/2024 for Urinary Frequency (Pt C/o R sides lower back pain and urine freququency)   R low back pain for several months with some radiation down right leg. Sunday developed RLQ abd pain, last night developed urinary frequency and some nausea.  She has chronically loose stools in h/o crohn's disease, on Entyvio .   No fevers/chills, dysuria, urgency, incomplete emptying. No recent antibiotics.  No vomiting.   Last kidney stone was 37 years ago.  H/o appendectomy 1970s H/o hysterectomy, ovaries also removed  Colonoscopy 07/2023 - multiple TAs, healthy anastomosis, rpt 1 yr ([facility])      Relevant past medical, surgical, family and social history reviewed and updated as indicated. Interim medical history since our last visit reviewed. Allergies and medications reviewed and updated. Outpatient Medications Prior to Visit  Medication Sig Dispense Refill   amLODipine  (NORVASC ) 10 MG tablet Take 1 tablet (10 mg total) by mouth daily. 90 tablet 3   calcium  carbonate (ANTACID CALCIUM ) 500 MG chewable tablet Chew 1 tablet (200 mg of elemental calcium  total) by mouth daily.     cyanocobalamin  (VITAMIN B12) 1000 MCG/ML injection Inject 1 mL (1,000 mcg total) into the skin every 30 (thirty) days. 10 mL 1   folic acid  (FOLVITE ) 800 MCG tablet Take 1 tablet (800 mcg total) by mouth daily.     levothyroxine  (SYNTHROID ) 50 MCG tablet Take 1 tablet (50 mcg total) by mouth daily before breakfast. 90 tablet 1   magnesium  chloride (SLOW-MAG) 64 MG TBEC SR tablet Take 2 tablets by mouth 2 (two) times daily.      pantoprazole  (PROTONIX ) 40 MG tablet Take 1 tablet (40 mg total) by mouth daily. 90 tablet 3   predniSONE  (DELTASONE ) 5 MG tablet TAKE ONE TABLET (5 MG TOTAL) BY MOUTH DAILY WITH BREAKFAST. 90 tablet 1   propranolol  (INDERAL ) 20 MG tablet Take 1 tablet (20 mg total) by mouth 2 (two) times daily. 180 tablet 3   SYRINGE/NEEDLE, DISP, 1 ML 23G X 1 1 ML MISC Use as directed 8 each 0   traZODone  (DESYREL ) 50 MG tablet Take 0.5-1 tablets (25-50 mg total) by mouth at bedtime as needed for sleep. 30 tablet 3   valACYclovir (VALTREX) 1000 MG tablet Take 4,000 mg by mouth once.     Vedolizumab  (ENTYVIO  PEN) 108 MG/0.68ML SOAJ Inject 1 Pen into the skin every 14 (fourteen) days. 4.08 mL 3   Vitamin D , Ergocalciferol , (DRISDOL ) 1.25 MG (50000 UNIT) CAPS capsule Take 1 capsule (50,000 Units total) by mouth every 7 (seven) days. 12 capsule 3   Facility-Administered Medications Prior to Visit  Medication Dose Route Frequency Provider Last Rate Last Admin   denosumab  (PROLIA ) injection 60 mg  60 mg Subcutaneous Once Ankush Gintz, MD       denosumab  (PROLIA ) injection 60 mg  60 mg Subcutaneous Q6 months [name], MD       [START ON 10/28/2024] denosumab  (PROLIA ) injection 60 mg  60 mg Subcutaneous Once  Cleatus Arlyss RAMAN, MD         Per HPI unless specifically indicated in ROS section below Review of Systems  Objective:  BP 112/64   Pulse (!) 58   Temp 97.8 F (36.6 C) (Oral)   Ht 4' 11.5 (1.511 m)   Wt 115 lb 2 oz (52.2 kg)   SpO2 97%   BMI 22.86 kg/m   Wt Readings from Last 3 Encounters:  06/21/24 115 lb 2 oz (52.2 kg)  06/19/24 114 lb 8 oz (51.9 kg)  06/12/24 116 lb 6.4 oz (52.8 kg)      Physical Exam Vitals and nursing note reviewed.  Constitutional:      Appearance: Normal appearance. She is not ill-appearing.  HENT:     Mouth/Throat:     Mouth: Mucous membranes are moist.     Pharynx: Oropharynx is clear. No oropharyngeal exudate or posterior oropharyngeal erythema.  Eyes:      Extraocular Movements: Extraocular movements intact.     Pupils: Pupils are equal, round, and reactive to light.  Cardiovascular:     Rate and Rhythm: Normal rate and regular rhythm.     Pulses: Normal pulses.     Heart sounds: Normal heart sounds. No murmur heard. Pulmonary:     Effort: Pulmonary effort is normal. No respiratory distress.     Breath sounds: Normal breath sounds. No wheezing, rhonchi or rales.  Abdominal:     General: Bowel sounds are normal.     Palpations: Abdomen is soft. There is no hepatomegaly or splenomegaly.     Tenderness: There is abdominal tenderness (moderate) in the right lower quadrant and left lower quadrant. There is guarding. There is no rebound. Negative signs include Murphy's sign and psoas sign.     Hernia: No hernia is present.  Musculoskeletal:     Right lower leg: No edema.     Left lower leg: No edema.  Skin:    General: Skin is warm and dry.     Findings: No rash.  Neurological:     Mental Status: She is alert.  Psychiatric:        Mood and Affect: Mood normal.        Behavior: Behavior normal.       Results for orders placed or performed in visit on 06/21/24  POCT Urinalysis Dipstick (Automated)   Collection Time: 06/21/24  4:29 PM  Result Value Ref Range   Color, UA yellow    Clarity, UA clear    Glucose, UA Negative Negative   Bilirubin, UA Negative    Ketones, UA Negative    Spec Grav, UA 1.010 1.010 - 1.025   Blood, UA 1+    pH, UA 6.0 5.0 - 8.0   Protein, UA Negative Negative   Urobilinogen, UA 0.2 0.2 or 1.0 E.U./dL   Nitrite, UA Negative    Leukocytes, UA Negative Negative  Urine Culture   Collection Time: 06/21/24  4:33 PM   Specimen: Urine  Result Value Ref Range   MICRO NUMBER: [id number]    SPECIMEN QUALITY: Adequate    Sample Source NOT GIVEN    STATUS: FINAL    Result: No Growth    CT ABDOMEN PELVIS W CONTRAST CLINICAL DATA:  Right lower quadrant and right back pain with frequent urination and dysuria.  History of Crohn's disease with partial small bowel resection in 1980. Status post appendectomy and total hysterectomy.  EXAM: CT ABDOMEN AND PELVIS WITH CONTRAST  TECHNIQUE: Multidetector CT imaging of  the abdomen and pelvis was performed using the standard protocol following bolus administration of intravenous contrast.  RADIATION DOSE REDUCTION: This exam was performed according to the departmental dose-optimization program which includes automated exposure control, adjustment of the mA and/or kV according to patient size and/or use of iterative reconstruction technique.  CONTRAST:  80mL OMNIPAQUE  IOHEXOL  300 MG/ML  SOLN  COMPARISON:  11/25/2020  FINDINGS: Lower chest: Normal-sized heart. Minimal bilateral dependent atelectasis.  Hepatobiliary: Small area of focal fat deposition adjacent to the falciform ligament. Stable subcentimeter rounded low-density in the right lobe adjacent to the gallbladder, most likely representing cyst. Unremarkable gallbladder.  Pancreas: Unremarkable. No pancreatic ductal dilatation or surrounding inflammatory changes.  Spleen: Normal in size without focal abnormality.  Adrenals/Urinary Tract: Normal-appearing adrenal glands. Stable small simple appearing right renal cyst, not requiring imaging follow-up. Bilateral extrarenal pelves without hydronephrosis, without significant change. Normal-appearing ureters and urinary bladder.  Stomach/Bowel: Interval mild low-density wall thickening involving the sigmoid colon and rectum. No significant change in low-density wall thickening involving the terminal ileum. Surgically absent appendix. Unremarkable stomach.  Vascular/Lymphatic: Atheromatous calcifications involving the proximal aorta and origin of the left renal artery. No enlarged lymph nodes.  Reproductive: Status post hysterectomy. No adnexal masses.  Other: Very small right paraumbilical hernia containing fat.  Musculoskeletal:  Mild lumbar spine degenerative changes and mild scoliosis.  IMPRESSION: 1. Interval mild low-density wall thickening involving the sigmoid colon and rectum, compatible with mild colitis and proctitis. 2. No significant change in low-density wall thickening involving the terminal ileum, compatible with chronic Crohn's disease. 3. Very small right paraumbilical hernia containing fat. 4. Aortic atherosclerosis.  Aortic Atherosclerosis (ICD10-I70.0).  Electronically Signed   By: Elspeth Bathe M.D.   On: 06/22/2024 11:17  Assessment & Plan:   Problem List Items Addressed This Visit     Crohn's disease (HCC) (Chronic)   Concern for active flare - see above.       Adrenal insufficiency (HCC) (Chronic)   Continues prednisone  5mg  daily. Check am cortisol tomorrow.      Relevant Orders   Cortisol (Completed)   Lower abdominal pain - Primary   Several months of R lower back pain, now with 3d h/o lower abdominal pain, R>L LQ, associated with overnight nocturia and suprapubic pressure. Abnormal abdominal exam including guarding with lower quadrant abdominal discomfort to palpation. Automated UA showing 1+ blood however microscopy in office today without any RBC/hpf noted, or bacteria or WBC for that matter. UCx pending.  Recommend further evaluation with labwork (8am STAT labs tomorrow as lab is closed today) and contrasted CT abd/pelvis.  Crohn's disease complicates story.  Reviewed overnight ER precautions with patient.       Relevant Orders   Urine Culture (Completed)   CT ABDOMEN PELVIS W CONTRAST (Completed)   Comprehensive metabolic panel with GFR (Completed)   CBC with Differential/Platelet (Completed)   Sedimentation rate (Completed)   Cortisol (Completed)   Other Visit Diagnoses       Urinary frequency       Relevant Orders   POCT Urinalysis Dipstick (Automated) (Completed)   Urine Culture (Completed)        No orders of the defined types were placed in this  encounter.   Orders Placed This Encounter  Procedures   Urine Culture   CT ABDOMEN PELVIS W CONTRAST    Standing Status:   Future    Number of Occurrences:   1    Expiration Date:   06/21/2025    If  indicated for the ordered procedure, I authorize the administration of contrast media per Radiology protocol:   Yes    Does the patient have a contrast media/X-ray dye allergy?:   No    Preferred imaging location?:   [facility]    If indicated for the ordered procedure, I authorize the administration of oral contrast media per Radiology protocol:   Yes   Comprehensive metabolic panel with GFR    Standing Status:   Future    Number of Occurrences:   1    Expiration Date:   06/21/2025   CBC with Differential/Platelet    Standing Status:   Future    Number of Occurrences:   1    Expiration Date:   06/21/2025   Sedimentation rate    Standing Status:   Future    Number of Occurrences:   1    Expiration Date:   06/21/2025   Cortisol    Standing Status:   Future    Number of Occurrences:   1    Expiration Date:   06/21/2025   POCT Urinalysis Dipstick (Automated)    Patient Instructions  Urine looking ok - culture sent  Return tomorrow morning 8:15am for labwork. I will also order CT scan with contrast for further evaluation of lower back and lower abdominal pain.  Clear liquid diet, bowel rest for next several days To ER if worsening symptoms - abdominal pain, fever.   Follow up plan: Return if symptoms worsen or fail to improve.  Anton Blas, MD

## 2024-06-21 NOTE — Telephone Encounter (Signed)
 FYI Only or Action Required?: FYI only for provider.  Patient was last seen in primary care on 06/19/2024 by [name], MD.  Called Nurse Triage reporting Urinary Frequency.  Symptoms began a week ago.  Interventions attempted: Nothing.  Symptoms are: gradually worsening.  Triage Disposition: See HCP Within 4 Hours (Or PCP Triage)  Patient/caregiver understands and will follow disposition?: No, refuses disposition   Reason for Disposition  Side (flank) or lower back pain present  Answer Assessment - Initial Assessment Questions 1. SYMPTOM: What's the main symptom you're concerned about? (e.g., frequency, incontinence)     Frequency, back pain 2. ONSET: When did the  frequency  start?     Wednesday 3. PAIN: Is there any pain? If Yes, ask: How bad is it? (Scale: 1-10; mild, moderate, severe)     7/10 4. CAUSE: What do you think is causing the symptoms?     UTI- stone 5. OTHER SYMPTOMS: Do you have any other symptoms? (e.g., blood in urine, fever, flank pain, pain with urination)     R lower back pain Offered sooner appointment- but patient only will see PCP- will send message for review - please let her know if she can come sooner  Protocols used: Urinary Symptoms-A-AH   Copied from CRM #[id number]. Topic: Clinical - Red Word Triage >> Jun 21, 2024  9:16 AM Suzen RAMAN wrote: Red Word that prompted transfer to Nurse Triage: lower back and urinary frequency

## 2024-06-21 NOTE — Patient Instructions (Addendum)
 Urine looking ok - culture sent  Return tomorrow morning 8:15am for labwork. I will also order CT scan with contrast for further evaluation of lower back and lower abdominal pain.  Clear liquid diet, bowel rest for next several days To ER if worsening symptoms - abdominal pain, fever.

## 2024-06-21 NOTE — Assessment & Plan Note (Addendum)
 Several months of R lower back pain, now with 3d h/o lower abdominal pain, R>L LQ, associated with overnight nocturia and suprapubic pressure. Abnormal abdominal exam including guarding with lower quadrant abdominal discomfort to palpation. Automated UA showing 1+ blood however microscopy in office today without any RBC/hpf noted, or bacteria or WBC for that matter. UCx pending.  Recommend further evaluation with labwork (8am STAT labs tomorrow as lab is closed today) and contrasted CT abd/pelvis.  Crohn's disease complicates story.  Reviewed overnight ER precautions with patient.

## 2024-06-21 NOTE — Telephone Encounter (Signed)
 Per pcp called patient added on to schedule today arrive at 4:15. She will reach out if any changes in symptoms.

## 2024-06-21 NOTE — Assessment & Plan Note (Addendum)
 Continues prednisone  5mg  daily. Check am cortisol tomorrow.

## 2024-06-22 ENCOUNTER — Other Ambulatory Visit (INDEPENDENT_AMBULATORY_CARE_PROVIDER_SITE_OTHER)

## 2024-06-22 ENCOUNTER — Ambulatory Visit
Admission: RE | Admit: 2024-06-22 | Discharge: 2024-06-22 | Disposition: A | Discharge status: Home / Self Care | Source: Ambulatory Visit | Attending: Family Medicine | Admitting: Family Medicine

## 2024-06-22 ENCOUNTER — Ambulatory Visit: Payer: Self-pay | Admitting: Family Medicine

## 2024-06-22 DIAGNOSIS — R1030 Lower abdominal pain, unspecified: Secondary | ICD-10-CM | POA: Insufficient documentation

## 2024-06-22 DIAGNOSIS — E2740 Unspecified adrenocortical insufficiency: Secondary | ICD-10-CM | POA: Diagnosis not present

## 2024-06-22 DIAGNOSIS — N281 Cyst of kidney, acquired: Secondary | ICD-10-CM | POA: Diagnosis not present

## 2024-06-22 LAB — CBC WITH DIFFERENTIAL/PLATELET
Basophils Absolute: 0.1 K/uL (ref 0.0–0.1)
Basophils Relative: 0.8 % (ref 0.0–3.0)
Eosinophils Absolute: 0.2 K/uL (ref 0.0–0.7)
Eosinophils Relative: 2.3 % (ref 0.0–5.0)
HCT: 40 % (ref 36.0–46.0)
Hemoglobin: 13.2 g/dL (ref 12.0–15.0)
Lymphocytes Relative: 41.9 % (ref 12.0–46.0)
Lymphs Abs: 3 K/uL (ref 0.7–4.0)
MCHC: 33.1 g/dL (ref 30.0–36.0)
MCV: 96.4 fl (ref 78.0–100.0)
Monocytes Absolute: 0.5 K/uL (ref 0.1–1.0)
Monocytes Relative: 7.3 % (ref 3.0–12.0)
Neutro Abs: 3.4 K/uL (ref 1.4–7.7)
Neutrophils Relative %: 47.7 % (ref 43.0–77.0)
Platelets: 304 K/uL (ref 150.0–400.0)
RBC: 4.15 Mil/uL (ref 3.87–5.11)
RDW: 13.9 % (ref 11.5–15.5)
WBC: 7.1 K/uL (ref 4.0–10.5)

## 2024-06-22 LAB — COMPREHENSIVE METABOLIC PANEL WITH GFR
ALT: 27 U/L (ref 0–35)
AST: 22 U/L (ref 0–37)
Albumin: 4.1 g/dL (ref 3.5–5.2)
Alkaline Phosphatase: 46 U/L (ref 39–117)
BUN: 14 mg/dL (ref 6–23)
CO2: 26 meq/L (ref 19–32)
Calcium: 8.6 mg/dL (ref 8.4–10.5)
Chloride: 105 meq/L (ref 96–112)
Creatinine, Ser: 1.06 mg/dL (ref 0.40–1.20)
GFR: 52 mL/min — ABNORMAL LOW (ref >60.00)
Glucose, Bld: 91 mg/dL (ref 70–99)
Potassium: 3.7 meq/L (ref 3.5–5.1)
Sodium: 141 meq/L (ref 135–145)
Total Bilirubin: 0.7 mg/dL (ref 0.2–1.2)
Total Protein: 7.3 g/dL (ref 6.0–8.3)

## 2024-06-22 LAB — SEDIMENTATION RATE: Sed Rate: 8 mm/h (ref 0–30)

## 2024-06-22 LAB — CORTISOL: Cortisol, Plasma: 1.9 ug/dL

## 2024-06-22 MED ORDER — PREDNISONE 20 MG PO TABS: 40.0000 mg | ORAL_TABLET | Freq: Every day | ORAL | 0 refills | Status: AC

## 2024-06-22 MED ORDER — IOHEXOL 300 MG/ML  SOLN
80.0000 mL | Freq: Once | INTRAMUSCULAR | Status: AC | PRN
  Administered 2024-06-22: 80 mL via INTRAVENOUS

## 2024-06-22 MED ORDER — IOHEXOL 9 MG/ML PO SOLN
500.0000 mL | ORAL | Status: AC
  Administered 2024-06-22 (×2): 500 mL via ORAL

## 2024-06-23 ENCOUNTER — Ambulatory Visit: Admitting: Family Medicine

## 2024-06-23 LAB — URINE CULTURE
MICRO NUMBER:: 16896210
Result:: NO GROWTH
SPECIMEN QUALITY:: ADEQUATE

## 2024-06-23 NOTE — Telephone Encounter (Signed)
 Pt states she does have pain in back and hip but over all feels better

## 2024-06-30 NOTE — Telephone Encounter (Signed)
 Source  Michelle Vasquez (Patient)   Subject  Michelle Vasquez (Patient)   Topic  General - Other    Communication  Reason for CRM: Patient was returning a call from Amy, please call patient when available. Callback number [phone].

## 2024-06-30 NOTE — Telephone Encounter (Signed)
Called, went to voicemail. Will try again later

## 2024-06-30 NOTE — Assessment & Plan Note (Signed)
 Concern for active flare - see above.

## 2024-07-04 DIAGNOSIS — K5080 Crohn's disease of both small and large intestine without complications: Secondary | ICD-10-CM | POA: Diagnosis not present

## 2024-07-04 DIAGNOSIS — Z860100 Personal history of colon polyps, unspecified: Secondary | ICD-10-CM | POA: Diagnosis not present

## 2024-07-04 DIAGNOSIS — Z599 Problem related to housing and economic circumstances, unspecified: Secondary | ICD-10-CM | POA: Diagnosis not present

## 2024-07-11 ENCOUNTER — Ambulatory Visit: Payer: Self-pay | Admitting: *Deleted

## 2024-07-11 NOTE — Telephone Encounter (Signed)
 Will see tomorrow

## 2024-07-11 NOTE — Telephone Encounter (Signed)
 Copied from CRM [id number]. Topic: Clinical - Red Word Triage >> Jul 11, 2024  9:39 AM Macario HERO wrote: Red Word that prompted transfer to Nurse Triage: Patient called said she's been sick for about a week. cough, sore throat, congestion, can't hear out of right ear (severe pain) Reason for Disposition  Earache  Answer Assessment - Initial Assessment Questions 1. LOCATION: Where does it hurt?      Right ear pain and sinus congestion is very thick.   Coughing and sore throat.   I'm for a colonoscopy next week.   I'm not getting rid of this stuff.   I'm not sleeping due to coughing.    2. ONSET: When did the sinus pain start?  (e.g., hours, days)      A week ago on Mon.   Started with a sore throat and I've gotten worse.   Low grade fever 3. SEVERITY: How bad is the pain?   (Scale 0-10; or none, mild, moderate or severe)     My right ear, I can't hear form it and hurts down into my jaw.   4. RECURRENT SYMPTOM: Have you ever had sinus problems before? If Yes, ask: When was the last time? and What happened that time?      Not asked 5. NASAL CONGESTION: Is the nose blocked? If Yes, ask: Can you open it or must you breathe through your mouth?     Sinus congestion is thick and I'm having headache and runny eyes.   Green mucus is coming up 6. NASAL DISCHARGE: Do you have discharge from your nose? If so ask, What color?     Yes green mucus started yesterday 7. FEVER: Do you have a fever? If Yes, ask: What is it, how was it measured, and when did it start?      Low grade 8. OTHER SYMPTOMS: Do you have any other symptoms? (e.g., sore throat, cough, earache, difficulty breathing)     Right earache, headaches, sinus congestion, sore throat 9. PREGNANCY: Is there any chance you are pregnant? When was your last menstrual period?     N/A  Protocols used: Sinus Pain or Congestion-A-AH FYI Only or Action Required?: FYI only for provider.  Patient was last seen in primary care on  06/21/2024 by [name], MD.  Called Nurse Triage reporting Sinusitis.right ear pain and pressure, sore throat, low grade fever, non productive cough, headaches  Symptoms began a week ago.  Interventions attempted: OTC medications: Mucinex  and Tylenol  .  Symptoms are: gradually worsening.  Triage Disposition: See Physician Within 24 Hours  Patient/caregiver understands and will follow disposition?: Yes

## 2024-07-12 ENCOUNTER — Ambulatory Visit (INDEPENDENT_AMBULATORY_CARE_PROVIDER_SITE_OTHER): Admitting: Family Medicine

## 2024-07-12 ENCOUNTER — Encounter: Payer: Self-pay | Admitting: Family Medicine

## 2024-07-12 VITALS — BP 122/64 | HR 63 | Temp 98.7°F | Ht 59.5 in | Wt 115.2 lb

## 2024-07-12 DIAGNOSIS — H6691 Otitis media, unspecified, right ear: Secondary | ICD-10-CM

## 2024-07-12 DIAGNOSIS — K50118 Crohn's disease of large intestine with other complication: Secondary | ICD-10-CM | POA: Diagnosis not present

## 2024-07-12 DIAGNOSIS — E2740 Unspecified adrenocortical insufficiency: Secondary | ICD-10-CM | POA: Diagnosis not present

## 2024-07-12 MED ORDER — AMOXICILLIN-POT CLAVULANATE 875-125 MG PO TABS
1.0000 | ORAL_TABLET | Freq: Two times a day (BID) | ORAL | 0 refills | Status: AC
Start: 2024-07-12 — End: 2024-07-22

## 2024-07-12 NOTE — Assessment & Plan Note (Addendum)
 Treat with augmentin  10d course.  Discussed tylenol  for pain. Update if breakthrough pain despite this.  Further supportive measures as per instructions.  She is currently on oral prednisone  20mg  daily.  Will recommend Prevnar-20 once feeling better.

## 2024-07-12 NOTE — Assessment & Plan Note (Signed)
 Followed by endo, normally on prednisone  5mg  daily, currently on 20mg  daily for Crohn's exacerbation.

## 2024-07-12 NOTE — Progress Notes (Signed)
 Ph: [phone] Fax: [phone]   Patient ID: Michelle Vasquez, female    DOB: 03-31-1951, 73 y.o.   MRN: [id number]  This visit was conducted in person.  BP 122/64   Pulse 63   Temp 98.7 F (37.1 C) (Oral)   Ht 4' 11.5 (1.511 m)   Wt 115 lb 4 oz (52.3 kg)   SpO2 97%   BMI 22.89 kg/m    CC: sinus congestion and ear pain I cannot hear out of right > left ear Subjective:   HPI: Michelle Vasquez is a 73 y.o. female presenting on 07/12/2024 for Medical Management of Chronic Issues (Pt accompanied by daughter Brittany./Pt C/o Cough, congestion, sore throat, and both ears ache. R ear is worse.  Going on for 10 days.)   10 h/o head congestion, ST, chills, cough productive of thick colored mucous. Also notes some dizziness. Has some bone pain to sinuses.  1 wk h/o decreased hearing.  This morning cough started improving.  Congestion may be clearing up.   No fevers, ear drainage, facial pain, trismus No recent abx use.   Tried mucinex , tylenol , nasal saline afrin and salt water gargles, breathing hot steam.  No sick contacts at home.   H/o hypoaldosteronism sees endo  H/o crohn's sees GI upcoming colonoscopy next week     Relevant past medical, surgical, family and social history reviewed and updated as indicated. Interim medical history since our last visit reviewed. Allergies and medications reviewed and updated. Outpatient Medications Prior to Visit  Medication Sig Dispense Refill   amLODipine  (NORVASC ) 10 MG tablet Take 1 tablet (10 mg total) by mouth daily. 90 tablet 3   calcium  carbonate (ANTACID CALCIUM ) 500 MG chewable tablet Chew 1 tablet (200 mg of elemental calcium  total) by mouth daily.     cyanocobalamin  (VITAMIN B12) 1000 MCG/ML injection Inject 1 mL (1,000 mcg total) into the skin every 30 (thirty) days. 10 mL 1   folic acid  (FOLVITE ) 800 MCG tablet Take 1 tablet (800 mcg total) by mouth daily.     levothyroxine  (SYNTHROID ) 50 MCG tablet Take 1 tablet  (50 mcg total) by mouth daily before breakfast. 90 tablet 1   magnesium  chloride (SLOW-MAG) 64 MG TBEC SR tablet Take 2 tablets by mouth 2 (two) times daily.     pantoprazole  (PROTONIX ) 40 MG tablet Take 1 tablet (40 mg total) by mouth daily. 90 tablet 3   predniSONE  (DELTASONE ) 5 MG tablet TAKE ONE TABLET (5 MG TOTAL) BY MOUTH DAILY WITH BREAKFAST. 90 tablet 1   propranolol  (INDERAL ) 20 MG tablet Take 1 tablet (20 mg total) by mouth 2 (two) times daily. 180 tablet 3   SYRINGE/NEEDLE, DISP, 1 ML 23G X 1 1 ML MISC Use as directed 8 each 0   traZODone  (DESYREL ) 50 MG tablet Take 0.5-1 tablets (25-50 mg total) by mouth at bedtime as needed for sleep. 30 tablet 3   valACYclovir (VALTREX) 1000 MG tablet Take 4,000 mg by mouth once.     Vedolizumab  (ENTYVIO  PEN) 108 MG/0.68ML SOAJ Inject 1 Pen into the skin every 14 (fourteen) days. 4.08 mL 3   Vitamin D , Ergocalciferol , (DRISDOL ) 1.25 MG (50000 UNIT) CAPS capsule Take 1 capsule (50,000 Units total) by mouth every 7 (seven) days. 12 capsule 3   Facility-Administered Medications Prior to Visit  Medication Dose Route Frequency Provider Last Rate Last Admin   denosumab  (PROLIA ) injection 60 mg  60 mg Subcutaneous Once Clarissa Laird, MD  denosumab  (PROLIA ) injection 60 mg  60 mg Subcutaneous Q6 months [name], MD       NOREEN ON 10/28/2024] denosumab  (PROLIA ) injection 60 mg  60 mg Subcutaneous Once Duncan, Graham S, MD         Per HPI unless specifically indicated in ROS section below Review of Systems  Objective:  BP 122/64   Pulse 63   Temp 98.7 F (37.1 C) (Oral)   Ht 4' 11.5 (1.511 m)   Wt 115 lb 4 oz (52.3 kg)   SpO2 97%   BMI 22.89 kg/m   Wt Readings from Last 3 Encounters:  07/12/24 115 lb 4 oz (52.3 kg)  06/21/24 115 lb 2 oz (52.2 kg)  06/19/24 114 lb 8 oz (51.9 kg)      Physical Exam Vitals and nursing note reviewed.  Constitutional:      Appearance: Normal appearance. She is not ill-appearing.      Comments: Tired appearing   HENT:     Head: Normocephalic and atraumatic.     Right Ear: Ear canal and external ear normal. A middle ear effusion is present. There is no impacted cerumen. Tympanic membrane is injected and bulging.     Left Ear: Tympanic membrane, ear canal and external ear normal. There is no impacted cerumen.     Nose: Nose normal. No congestion or rhinorrhea.     Mouth/Throat:     Mouth: Mucous membranes are moist.     Pharynx: Oropharynx is clear. No oropharyngeal exudate or posterior oropharyngeal erythema.  Eyes:     Extraocular Movements: Extraocular movements intact.     Conjunctiva/sclera: Conjunctivae normal.     Pupils: Pupils are equal, round, and reactive to light.  Cardiovascular:     Rate and Rhythm: Normal rate and regular rhythm.     Pulses: Normal pulses.     Heart sounds: Normal heart sounds. No murmur heard. Pulmonary:     Effort: Pulmonary effort is normal. No respiratory distress.     Breath sounds: Normal breath sounds. No wheezing, rhonchi or rales.  Lymphadenopathy:     Head:     Right side of head: No submental, submandibular, tonsillar, preauricular or posterior auricular adenopathy.     Left side of head: No submental, submandibular, tonsillar, preauricular or posterior auricular adenopathy.     Cervical: No cervical adenopathy.     Right cervical: No superficial cervical adenopathy.    Left cervical: No superficial cervical adenopathy.     Upper Body:     Right upper body: No supraclavicular adenopathy.     Left upper body: No supraclavicular adenopathy.  Skin:    Findings: No rash.  Neurological:     Mental Status: She is alert.  Psychiatric:        Mood and Affect: Mood normal.        Behavior: Behavior normal.        Assessment & Plan:   Problem List Items Addressed This Visit     Crohn's disease (HCC) (Chronic)   Currently on prednisone  20mg  daily for Crohn's eacerbation, saw GI pending colonoscopy next week.  She will  let  Dr Unk know she's started on augmentin  course for AOM.       Adrenal insufficiency (HCC) (Chronic)   Followed by endo, normally on prednisone  5mg  daily, currently on 20mg  daily for Crohn's exacerbation.       Acute otitis media, right - Primary   Treat with augmentin  10d course.  Discussed tylenol  for  pain. Update if breakthrough pain despite this.  Further supportive measures as per instructions.  She is currently on oral prednisone  20mg  daily.  Will recommend Prevnar-20 once feeling better.       Relevant Medications   amoxicillin -clavulanate (AUGMENTIN ) 875-125 MG tablet     Meds ordered this encounter  Medications   amoxicillin -clavulanate (AUGMENTIN ) 875-125 MG tablet    Sig: Take 1 tablet by mouth 2 (two) times daily for 10 days.    Dispense:  20 tablet    Refill:  0    No orders of the defined types were placed in this encounter.   Patient Instructions  You have right middle ear infection Treat with augmentin  antibiotic for 10 days.  Push fluids and rest May take tylenol  for pain.  Let us  know if not improving with this treatment over next 2-3 days.   Follow up plan: Return if symptoms worsen or fail to improve.  Anton Blas, MD

## 2024-07-12 NOTE — Patient Instructions (Addendum)
 You have right middle ear infection Treat with augmentin  antibiotic for 10 days.  Push fluids and rest May take tylenol  for pain.  Let us  know if not improving with this treatment over next 2-3 days.

## 2024-07-12 NOTE — Assessment & Plan Note (Signed)
 Currently on prednisone  20mg  daily for Crohn's eacerbation, saw GI pending colonoscopy next week.  She will let  Dr Unk know she's started on augmentin  course for AOM.

## 2024-07-19 ENCOUNTER — Ambulatory Visit (INDEPENDENT_AMBULATORY_CARE_PROVIDER_SITE_OTHER)

## 2024-07-19 DIAGNOSIS — E538 Deficiency of other specified B group vitamins: Secondary | ICD-10-CM

## 2024-07-19 MED ORDER — CYANOCOBALAMIN 1000 MCG/ML IJ SOLN
1000.0000 ug | Freq: Once | INTRAMUSCULAR | Status: AC
  Administered 2024-07-19: 1000 ug via INTRAMUSCULAR

## 2024-07-19 NOTE — Progress Notes (Signed)
 Per orders of Dr. Anton Blas, injection of VITAMIN B 12 given by Laray Arenas in left deltoid. Patient tolerated injection well. Patient will make appointment for 1 month.

## 2024-07-20 ENCOUNTER — Ambulatory Visit: Payer: Self-pay | Admitting: Anesthesiology

## 2024-07-20 ENCOUNTER — Encounter
Admission: RE | Disposition: A | Discharge status: Home / Self Care | Payer: Self-pay | Source: Home / Self Care | Attending: Gastroenterology

## 2024-07-20 ENCOUNTER — Ambulatory Visit: Payer: Self-pay

## 2024-07-20 ENCOUNTER — Encounter: Payer: Self-pay | Admitting: Gastroenterology

## 2024-07-20 ENCOUNTER — Ambulatory Visit
Admission: RE | Admit: 2024-07-20 | Discharge: 2024-07-20 | Disposition: A | Discharge status: Home / Self Care | Attending: Gastroenterology | Admitting: Gastroenterology

## 2024-07-20 DIAGNOSIS — D125 Benign neoplasm of sigmoid colon: Secondary | ICD-10-CM | POA: Diagnosis not present

## 2024-07-20 DIAGNOSIS — Z5941 Food insecurity: Secondary | ICD-10-CM | POA: Insufficient documentation

## 2024-07-20 DIAGNOSIS — K219 Gastro-esophageal reflux disease without esophagitis: Secondary | ICD-10-CM | POA: Insufficient documentation

## 2024-07-20 DIAGNOSIS — G709 Myoneural disorder, unspecified: Secondary | ICD-10-CM | POA: Insufficient documentation

## 2024-07-20 DIAGNOSIS — D124 Benign neoplasm of descending colon: Secondary | ICD-10-CM | POA: Insufficient documentation

## 2024-07-20 DIAGNOSIS — H6691 Otitis media, unspecified, right ear: Secondary | ICD-10-CM

## 2024-07-20 DIAGNOSIS — Z5986 Financial insecurity: Secondary | ICD-10-CM | POA: Diagnosis not present

## 2024-07-20 DIAGNOSIS — I10 Essential (primary) hypertension: Secondary | ICD-10-CM | POA: Diagnosis not present

## 2024-07-20 DIAGNOSIS — K644 Residual hemorrhoidal skin tags: Secondary | ICD-10-CM | POA: Insufficient documentation

## 2024-07-20 DIAGNOSIS — E039 Hypothyroidism, unspecified: Secondary | ICD-10-CM | POA: Diagnosis not present

## 2024-07-20 DIAGNOSIS — H9191 Unspecified hearing loss, right ear: Secondary | ICD-10-CM

## 2024-07-20 DIAGNOSIS — Z980 Intestinal bypass and anastomosis status: Secondary | ICD-10-CM | POA: Diagnosis not present

## 2024-07-20 DIAGNOSIS — K5080 Crohn's disease of both small and large intestine without complications: Secondary | ICD-10-CM | POA: Diagnosis not present

## 2024-07-20 DIAGNOSIS — D123 Benign neoplasm of transverse colon: Secondary | ICD-10-CM | POA: Insufficient documentation

## 2024-07-20 DIAGNOSIS — Z87891 Personal history of nicotine dependence: Secondary | ICD-10-CM | POA: Insufficient documentation

## 2024-07-20 DIAGNOSIS — K635 Polyp of colon: Secondary | ICD-10-CM | POA: Diagnosis not present

## 2024-07-20 DIAGNOSIS — K648 Other hemorrhoids: Secondary | ICD-10-CM | POA: Diagnosis not present

## 2024-07-20 DIAGNOSIS — R197 Diarrhea, unspecified: Secondary | ICD-10-CM | POA: Diagnosis not present

## 2024-07-20 HISTORY — PX: POLYPECTOMY: SHX149

## 2024-07-20 HISTORY — PX: COLONOSCOPY: SHX5424

## 2024-07-20 SURGERY — COLONOSCOPY
Anesthesia: General

## 2024-07-20 MED ORDER — PROPOFOL 10 MG/ML IV BOLUS
INTRAVENOUS | Status: DC | PRN
  Administered 2024-07-20: 40 mg via INTRAVENOUS
  Administered 2024-07-20: 125 ug/kg/min via INTRAVENOUS
  Administered 2024-07-20: 20 mg via INTRAVENOUS

## 2024-07-20 MED ORDER — LIDOCAINE HCL (CARDIAC) PF 100 MG/5ML IV SOSY
PREFILLED_SYRINGE | INTRAVENOUS | Status: DC | PRN
  Administered 2024-07-20: 50 mg via INTRAVENOUS

## 2024-07-20 MED ORDER — LIDOCAINE HCL (PF) 2 % IJ SOLN
INTRAMUSCULAR | Status: AC
  Filled 2024-07-20: qty 5

## 2024-07-20 MED ORDER — SODIUM CHLORIDE 0.9 % IV SOLN: INTRAVENOUS | Status: DC

## 2024-07-20 NOTE — H&P (Signed)
 Michelle JONELLE Brooklyn, MD [facility], [address]  [place], [place] [address]  Main: [phone] Fax:  [phone] Pager: [phone]   Primary Care Physician:  [name], MD Primary Gastroenterologist:  Dr. Corinn JONELLE Vasquez  Pre-Procedure History & Physical: HPI:  Michelle Vasquez is a 73 y.o. female is here for an colonoscopy.   Past Medical History:  Diagnosis Date   Allergy    seasonal   Anxiety and depression 07/03/2011   B12 deficiency    Blood transfusion without reported diagnosis    Cataract    Central hearing loss    COVID-19 virus infection 03/03/2021   Crohn disease (HCC)    s/p colectomy on , ?bacterial overgrowth   Facial cellulitis 01/15/2023   Fatty liver 12/15/2011   GERD (gastroesophageal reflux disease)    Heart murmur    History of small bowel obstruction    HTN (hypertension)    Internal hemorrhoids without mention of complication    Osteomalacia, unspecified    Perennial allergic rhinitis with seasonal variation    Squamous cell cancer of skin of left hand 11/2022   Dr Ninetta   Unspecified deficiency anemia    Unspecified polyarthropathy or polyarthritis, multiple sites     Past Surgical History:  Procedure Laterality Date   APPENDECTOMY     BIOPSY  07/28/2023   Procedure: BIOPSY;  Surgeon: Vasquez Michelle Skiff, MD;  Location: [facility];  Service: Gastroenterology;;   COLONOSCOPY  02/25/2005   Internal hemms; crohns; narrow anast   COLONOSCOPY  02/26/2010   Prior right hemicolectomy o/w benign (Dr. Obie)   COLONOSCOPY  02/2015   benign biopsies, crohn's with stenotic stricture at anastomosis Priscilla)   COLONOSCOPY  12/2019   small adenomas, crohn's disease small intestine, diverticulosis, non-patent end to side ileocolonic anastomosis with inflammation and ulceration biopsied, rpt 3 yrs (Brahmbhatt)   COLONOSCOPY WITH ESOPHAGOGASTRODUODENOSCOPY (EGD)  09/2023   COLONOSCOPY WITH PROPOFOL  N/A  07/28/2023   Procedure: COLONOSCOPY WITH PROPOFOL ;  Surgeon: Vasquez Michelle Skiff, MD;  Location: [facility] ENDOSCOPY;  Service: Gastroenterology;  Laterality: N/A;   DEXA  07/06/2011   normal T score -1.0 femur, spine   ESOPHAGOGASTRODUODENOSCOPY  02/25/2005   Dilated stricture   ESOPHAGOGASTRODUODENOSCOPY  10/2017   esophageal stenosis dilated, duodenal diverticuluc (Nandigam)   ESOPHAGOGASTRODUODENOSCOPY (EGD) WITH PROPOFOL  N/A 07/28/2023   Procedure: ESOPHAGOGASTRODUODENOSCOPY (EGD) WITH PROPOFOL ;  Surgeon: Vasquez Michelle Skiff, MD;  Location: [facility];  Service: Gastroenterology;  Laterality: N/A;   HEMICOLECTOMY  1980   all small intestine, none large   OTHER SURGICAL HISTORY  1960s, 1978, 1980   ileal resection x 3 and subtotal colon resection   POLYPECTOMY  07/28/2023   Procedure: POLYPECTOMY INTESTINAL;  Surgeon: Vasquez Michelle Skiff, MD;  Location: [facility];  Service: Gastroenterology;;   SMALL INTESTINE SURGERY     TOTAL ABDOMINAL HYSTERECTOMY  1975   ovaries out as well   UPPER GASTROINTESTINAL ENDOSCOPY      Prior to Admission medications   Medication Sig Start Date End Date Taking? Authorizing Provider  amLODipine  (NORVASC ) 10 MG tablet Take 1 tablet (10 mg total) by mouth daily. 06/19/24  Yes [name], MD  folic acid  (FOLVITE ) 800 MCG tablet Take 1 tablet (800 mcg total) by mouth daily. 04/25/21  Yes [name], MD  levothyroxine  (SYNTHROID ) 50 MCG tablet Take 1 tablet (50 mcg total) by mouth daily before breakfast. 06/12/24  Yes Nida, Gebreselassie W, MD  magnesium  chloride (SLOW-MAG) 64 MG TBEC SR  tablet Take 2 tablets by mouth 2 (two) times daily.   Yes [provider]  pantoprazole  (PROTONIX ) 40 MG tablet Take 1 tablet (40 mg total) by mouth daily. 06/19/24  Yes [name], MD  propranolol  (INDERAL ) 20 MG tablet Take 1 tablet (20 mg total) by mouth 2 (two) times daily. 06/19/24  Yes [name], MD  Vitamin D , Ergocalciferol ,  (DRISDOL ) 1.25 MG (50000 UNIT) CAPS capsule Take 1 capsule (50,000 Units total) by mouth every 7 (seven) days. 06/20/24  Yes [name], MD  amoxicillin -clavulanate (AUGMENTIN ) 875-125 MG tablet Take 1 tablet by mouth 2 (two) times daily for 10 days. 07/12/24 07/22/24  [name], MD  calcium  carbonate (ANTACID CALCIUM ) 500 MG chewable tablet Chew 1 tablet (200 mg of elemental calcium  total) by mouth daily. 09/10/23   [name], MD  cyanocobalamin  (VITAMIN B12) 1000 MCG/ML injection Inject 1 mL (1,000 mcg total) into the skin every 30 (thirty) days. 06/02/23   [name], MD  predniSONE  (DELTASONE ) 5 MG tablet TAKE ONE TABLET (5 MG TOTAL) BY MOUTH DAILY WITH BREAKFAST. 03/09/24   Nida, Gebreselassie W, MD  SYRINGE/NEEDLE, DISP, 1 ML 23G X 1 1 ML MISC Use as directed 07/31/22   [name], MD  traZODone  (DESYREL ) 50 MG tablet Take 0.5-1 tablets (25-50 mg total) by mouth at bedtime as needed for sleep. 06/19/24   [name], MD  valACYclovir (VALTREX) 1000 MG tablet Take 4,000 mg by mouth once. 04/26/24   [provider]  Vedolizumab  (ENTYVIO  PEN) 108 MG/0.68ML SOAJ Inject 1 Pen into the skin every 14 (fourteen) days. 03/01/24   Unk Michelle Skiff, MD    Allergies as of 07/05/2024 - Review Complete 06/21/2024  Allergen Reaction Noted   Celexa  [citalopram  hydrobromide] Other (See Comments) 01/10/2014    Family History  Problem Relation Age of Onset   Crohn's disease Father        colostomy   Hypertension Mother    Heart disease Mother 82   Alcohol abuse Brother    Cirrhosis Brother    Crohn's disease Sister    Crohn's disease Paternal Aunt        x 3   Crohn's disease Other        neice x 2   Colon cancer Neg Hx    Cancer Neg Hx    Diabetes Neg Hx    Rectal cancer Neg Hx    Stomach cancer Neg Hx    Esophageal cancer Neg Hx    Breast cancer Neg Hx     Social History   Socioeconomic History   Marital status: Divorced    Spouse name: Not  on file   Number of children: 3   Years of education: Not on file   Highest education level: 12th grade  Occupational History   Occupation: Medical Transcription    Employer: OTHER    Comment: [employer]  Tobacco Use   Smoking status: Former    Current packs/day: 0.00    Average packs/day: 0.3 packs/day for 15.0 years (3.8 ttl pk-yrs)    Types: Cigarettes    Start date: 10/27/1963    Quit date: 10/26/1978    Years since quitting: 45.7   Smokeless tobacco: Never  Vaping Use   Vaping status: Never Used  Substance and Sexual Activity   Alcohol use: No   Drug use: No   Sexual activity: Not on file  Other Topics Concern   Not on file  Social History Narrative   Caffeine:  2 cups coffee/day   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, no regular activity   Diet: good water, fruits/vegetables occasionally   Social Drivers of Health   Financial Resource Strain: High Risk (07/13/2024)   Received from [facility]   Overall Financial Resource Strain (CARDIA)    Difficulty of Paying Living Expenses: Very hard  Food Insecurity: Food Insecurity Present (07/13/2024)   Received from [facility]   Hunger Vital Sign    Within the past 12 months, you worried that your food would run out before you got the money to buy more.: Often true    Within the past 12 months, the food you bought just didn't last and you didn't have money to get more.: Often true  Transportation Needs: No Transportation Needs (07/13/2024)   Received from [facility] - Transportation    In the past 12 months, has lack of transportation kept you from medical appointments or from getting medications?: No    Lack of Transportation (Non-Medical): No  Physical Activity: Inactive (07/13/2024)   Received from [facility]   Exercise Vital Sign    On average, how many days per week do you engage in  moderate to strenuous exercise (like a brisk walk)?: 7 days    On average, how many minutes do you engage in exercise at this level?: 0 min  Stress: Stress Concern Present (07/13/2024)   Received from [facility] - Occupational Stress Questionnaire    Feeling of Stress : Rather much  Social Connections: Unknown (07/13/2024)   Received from [facility]   Social Connection and Isolation Panel    Frequency of Communication with Friends and Family: Not on file    How often do you get together with friends or relatives?: Once a week    How often do you attend church or religious services?: More than 4 times per year    Do you belong to any clubs or organizations such as church groups, unions, fraternal or athletic groups, or school groups?: No    How often do you attend meetings of the clubs or organizations you belong to?: Never    Are you married, widowed, divorced, separated, never married, or living with a partner?: Widowed  Recent Concern: Social Connections - Socially Isolated (06/15/2024)   Social Connection and Isolation Panel    Frequency of Communication with Friends and Family: Once a week    Frequency of Social Gatherings with Friends and Family: Never    Attends Religious Services: 1 to 4 times per year    Active Member of [employer] or Organizations: No    Attends Banker Meetings: Not on file    Marital Status: Widowed  Intimate Partner Violence: Not At Risk (05/10/2024)   Humiliation, Afraid, Rape, and Kick questionnaire    Fear of Current or Ex-Partner: No    Emotionally Abused: No    Physically Abused: No    Sexually Abused: No    Review of Systems: See HPI, otherwise negative ROS  Physical Exam: BP (!) 167/75   Pulse 66   Temp (!) 96.6 F (35.9 C) (Temporal)   Resp 18   Wt 50.8 kg   SpO2 100%   BMI 22.24 kg/m  General:   Alert,  pleasant and cooperative in NAD Head:   Normocephalic and  atraumatic. Neck:  Supple; no masses or thyromegaly. Lungs:  Clear throughout to auscultation.    Heart:  Regular rate and rhythm. Abdomen:  Soft, nontender and nondistended. Normal bowel sounds, without guarding, and without rebound.   Neurologic:  Alert and  oriented x4;  grossly normal neurologically.  Impression/Plan: Michelle Vasquez is here for an colonoscopy to be performed for follow up of crohn's disease  Risks, benefits, limitations, and alternatives regarding  colonoscopy have been reviewed with the patient.  Questions have been answered.  All parties agreeable.   Michelle Brooklyn, MD  07/20/2024, 8:02 AM

## 2024-07-20 NOTE — Telephone Encounter (Signed)
 First attempt; left vm.   Red eyes, discharges coming from eyes started yesterday. Also she still can not hear out of both ears, mainly in right ear. Call back #[phone]

## 2024-07-20 NOTE — Transfer of Care (Signed)
 Immediate Anesthesia Transfer of Care Note  Patient: Michelle Vasquez  Procedure(s) Performed: COLONOSCOPY POLYPECTOMY, INTESTINE  Patient Location: PACU  Anesthesia Type:MAC  Level of Consciousness: sedated  Airway & Oxygen Therapy: Patient Spontanous Breathing  Post-op Assessment: Report given to RN and Post -op Vital signs reviewed and stable  Post vital signs: stable  Last Vitals:  Vitals Value Taken Time  BP 149/91 07/20/24 09:02  Temp    Pulse 62 07/20/24 09:02  Resp 13 07/20/24 09:02  SpO2 99 % 07/20/24 09:02    Last Pain:  Vitals:   07/20/24 0729  TempSrc: Temporal  PainSc: 0-No pain         Complications: No notable events documented.

## 2024-07-20 NOTE — Op Note (Addendum)
 [facility] Gastroenterology Patient Name: Michelle Vasquez Procedure Date: 07/20/2024 7:22 AM MRN: [id number] Account #: [id number] Date of Birth: 1951-08-06 Admit Type: Outpatient Age: 73 Room: [facility] ENDO ROOM 4 Gender: Female Note Status: Supervisor Override Instrument Name: Peds Colonoscope [id number] Procedure:             Colonoscopy Indications:           High risk colon cancer surveillance: Personal history                         of colonic polyps, Last colonoscopy: October 2024,                         Crohn's disease of the small bowel and colon,                         Follow-up of Crohn's disease of the small bowel and                         colon, Disease activity assessment of Crohn's disease                         of the small bowel and colon, Assess therapeutic                         response to therapy of Crohn's disease of the small                         bowel and colon Providers:             Corinn Jess Brooklyn MD, MD Referring MD:          Anton Blas (Referring MD) Complications:         No immediate complications. Estimated blood loss: None. Procedure:             Pre-Anesthesia Assessment:                        - Prior to the procedure, a History and Physical was                         performed, and patient medications and allergies were                         reviewed. The patient is competent. The risks and                         benefits of the procedure and the sedation options and                         risks were discussed with the patient. All questions                         were answered and informed consent was obtained.                         Patient identification and proposed procedure were  verified by the physician, the nurse, the                         anesthesiologist, the anesthetist and the technician                         in the pre-procedure area in the procedure room in the                          endoscopy suite. Mental Status Examination: alert and                         oriented. Airway Examination: normal oropharyngeal                         airway and neck mobility. Respiratory Examination:                         clear to auscultation. CV Examination: normal.                         Prophylactic Antibiotics: The patient does not require                         prophylactic antibiotics. Prior Anticoagulants: The                         patient has taken no anticoagulant or antiplatelet                         agents. ASA Grade Assessment: II - A patient with mild                         systemic disease. After reviewing the risks and                         benefits, the patient was deemed in satisfactory                         condition to undergo the procedure. The anesthesia                         plan was to use general anesthesia. Immediately prior                         to administration of medications, the patient was                         re-assessed for adequacy to receive sedatives. The                         heart rate, respiratory rate, oxygen saturations,                         blood pressure, adequacy of pulmonary ventilation, and                         response to care were monitored throughout the  procedure. The physical status of the patient was                         re-assessed after the procedure.                        After obtaining informed consent, the colonoscope was                         passed under direct vision. Throughout the procedure,                         the patient's blood pressure, pulse, and oxygen                         saturations were monitored continuously. The                         Colonoscope was introduced through the anus and                         advanced to the the ileocolonic anastomosis. The                         colonoscopy was performed with moderate difficulty due                          to significant looping. Successful completion of the                         procedure was aided by applying abdominal pressure.                         The patient tolerated the procedure well. The quality                         of the bowel preparation was good. Findings:      The perianal and digital rectal examinations were normal. Pertinent       negatives include normal sphincter tone and no palpable rectal lesions.      There was evidence of a prior end-to-side ileo-colonic anastomosis in       the cecum. This was patent and was characterized by healthy appearing       mucosa. The anastomosis could not be traversed due to significant       looping. Biopsies were taken with a cold forceps for histology from       neoterminal ileum and anastamosis.      Two sessile polyps were found in the descending colon and transverse       colon. The polyps were 4 to 5 mm in size. These polyps were removed with       a cold snare. Resection and retrieval were complete.      A 7 mm polyp was found in the sigmoid colon. The polyp was sessile. The       polyp was removed with a cold snare. Resection and retrieval were       complete. To prevent bleeding after the polypectomy, one hemostatic clip       was successfully placed (MR safe). Clip manufacturer: [employer].  There was no bleeding during, or at the end, of the procedure.      Normal mucosa was found in the left colon and in the right colon.       Biopsies were taken with a cold forceps for histology.      Non-bleeding external and internal hemorrhoids were found during       retroflexion. The hemorrhoids were medium-sized.      Skin tags were found on perianal exam. Impression:            - Patent end-to-side ileo-colonic anastomosis,                         characterized by healthy appearing mucosa. Biopsied.                        - Two 4 to 5 mm polyps in the descending colon and in                          the transverse colon, removed with a cold snare.                         Resected and retrieved.                        - One 7 mm polyp in the sigmoid colon, removed with a                         cold snare. Resected and retrieved. Clip manufacturer:                         [employer]. Clip (MR safe) was placed.                        - Normal mucosa in the left colon and in the right                         colon. Biopsied.                        - Non-bleeding external and internal hemorrhoids.                        - Perianal skin tags found on perianal exam. Recommendation:        - Discharge patient to home (with escort).                        - Resume previous diet today.                        - Continue present medications.                        - Await pathology results.                        - Return to my office as previously scheduled. Procedure Code(s):     --- Professional ---  54614, Colonoscopy, flexible; with removal of                         tumor(s), polyp(s), or other lesion(s) by snare                         technique                        45380, 59, Colonoscopy, flexible; with biopsy, single                         or multiple Diagnosis Code(s):     --- Professional ---                        Z98.0, Intestinal bypass and anastomosis status                        D12.4, Benign neoplasm of descending colon                        D12.3, Benign neoplasm of transverse colon (hepatic                         flexure or splenic flexure)                        D12.5, Benign neoplasm of sigmoid colon                        K64.8, Other hemorrhoids                        K64.4, Residual hemorrhoidal skin tags                        K50.80, Crohn's disease of both small and large                         intestine without complications CPT copyright 2022 [employer]. All rights reserved. The codes documented in this  report are preliminary and upon coder review may  be revised to meet current compliance requirements. Dr. Corinn Brooklyn Corinn Jess Brooklyn MD, MD 07/20/2024 9:03:02 AM This report has been signed electronically. Number of Addenda: 0 Note Initiated On: 07/20/2024 7:22 AM Scope Withdrawal Time: 0 hours 41 minutes 20 seconds  Total Procedure Duration: 0 hours 48 minutes 20 seconds  Estimated Blood Loss:  Estimated blood loss: none.      [facility]

## 2024-07-20 NOTE — Telephone Encounter (Signed)
 FYI Only or Action Required?: Action required by provider: Med Request.  Patient was last seen in primary care on 07/12/2024 by [name], MD.  Called Nurse Triage reporting red eyes.  Symptoms began yesterday.  Interventions attempted: Nothing.  Symptoms are: gradually worsening.  Triage Disposition: Call PCP Within 24 Hours  Patient/caregiver understands and will follow disposition?: Yes  Reason for Disposition  [1] Bacterial conjunctivitis suspected (e.g., white, yellow or green discharge nearly all day long; or eyelids stuck shut from discharge after sleep) AND [2] NO doctor standing order to call in antibiotic eye drops  (Exception: [place]; continue triage.)  Answer Assessment - Initial Assessment Questions Bacterial infection in both ears and now believes it's in the eyes. States still having trouble hearing out of ears. No appointments available in office until tomorrow, patient is requesting something be sent to pharmacy on file today or if she can be seen today by PCP. Please advise.   1. EYE DISCHARGE: Is the discharge in one or both eyes? What color is it? How much is there? When did the discharge start?      Both eyes, yellow, started yesterday  2. REDNESS OF SCLERA: Is there redness in the white of the eye? If Yes, ask: Is it in one or both eyes? When did the redness start?     Both scleras are blood red  3. EYELIDS: Are the eyelids red or swollen? If Yes, ask: How much?      No  4. VISION: Do you have blurred vision?     Yes  5. PAIN: Is there any pain? If Yes, ask: How bad is the pain? (Scale 0-10; or none, mild, moderate, severe)     Mild  6. CONTACT LENS: Do you wear contacts?     No  7. OTHER SYMPTOMS: Do you have any other symptoms? (e.g., fever, runny nose, cough)     Head feels tight, lots of pressure  Protocols used: Eye - Pus or Discharge-A-AH  Message from Kevelyn M sent at 07/20/2024 10:11 AM EDT  Red eyes,  discharges coming from eyes started yesterday. Also she still can not hear out of both ears, mainly in right ear. Call back #[phone]

## 2024-07-20 NOTE — Anesthesia Preprocedure Evaluation (Signed)
 Anesthesia Evaluation  Patient identified by MRN, date of birth, ID band Patient awake    Reviewed: Allergy & Precautions, NPO status , Patient's Chart, lab work & pertinent test results  History of Anesthesia Complications Negative for: history of anesthetic complications  Airway Mallampati: III  TM Distance: <3 FB Neck ROM: full    Dental  (+) Chipped   Pulmonary neg shortness of breath, former smoker   Pulmonary exam normal        Cardiovascular Exercise Tolerance: Good hypertension, + angina  Normal cardiovascular exam     Neuro/Psych  Neuromuscular disease  negative psych ROS   GI/Hepatic Neg liver ROS,GERD  Controlled,,  Endo/Other  Hypothyroidism    Renal/GU negative Renal ROS  negative genitourinary   Musculoskeletal   Abdominal   Peds  Hematology negative hematology ROS (+)   Anesthesia Other Findings Past Medical History: No date: Allergy     Comment:  seasonal 07/03/2011: Anxiety and depression No date: B12 deficiency No date: Blood transfusion without reported diagnosis No date: Cataract No date: Central hearing loss 03/03/2021: COVID-19 virus infection No date: Crohn disease (HCC)     Comment:  s/p colectomy on , ?bacterial overgrowth 01/15/2023: Facial cellulitis 12/15/2011: Fatty liver No date: GERD (gastroesophageal reflux disease) No date: Heart murmur No date: History of small bowel obstruction No date: HTN (hypertension) No date: Internal hemorrhoids without mention of complication No date: Osteomalacia, unspecified No date: Perennial allergic rhinitis with seasonal variation 11/2022: Squamous cell cancer of skin of left hand     Comment:  Dr Ninetta No date: Unspecified deficiency anemia No date: Unspecified polyarthropathy or polyarthritis, multiple sites  Past Surgical History: No date: APPENDECTOMY 07/28/2023: BIOPSY     Comment:  Procedure: BIOPSY;  Surgeon: Unk Corinn Skiff, MD;                Location: [facility] ENDOSCOPY;  Service: Gastroenterology;; 02/25/2005: COLONOSCOPY     Comment:  Internal hemms; crohns; narrow anast 02/26/2010: COLONOSCOPY     Comment:  Prior right hemicolectomy o/w benign (Dr. Obie) 02/2015: COLONOSCOPY     Comment:  benign biopsies, crohn's with stenotic stricture at               anastomosis Priscilla) 12/2019: COLONOSCOPY     Comment:  small adenomas, crohn's disease small intestine,               diverticulosis, non-patent end to side ileocolonic               anastomosis with inflammation and ulceration biopsied,               rpt 3 yrs (Brahmbhatt) 09/2023: COLONOSCOPY WITH ESOPHAGOGASTRODUODENOSCOPY (EGD) 07/28/2023: COLONOSCOPY WITH PROPOFOL ; N/A     Comment:  Procedure: COLONOSCOPY WITH PROPOFOL ;  Surgeon: Unk Corinn Skiff, MD;  Location: [facility] ENDOSCOPY;  Service:               Gastroenterology;  Laterality: N/A; 07/06/2011: DEXA     Comment:  normal T score -1.0 femur, spine 02/25/2005: ESOPHAGOGASTRODUODENOSCOPY     Comment:  Dilated stricture 10/2017: ESOPHAGOGASTRODUODENOSCOPY     Comment:  esophageal stenosis dilated, duodenal diverticuluc               (Nandigam) 07/28/2023: ESOPHAGOGASTRODUODENOSCOPY (EGD) WITH PROPOFOL ; N/A     Comment:  Procedure: ESOPHAGOGASTRODUODENOSCOPY (EGD) WITH  PROPOFOL ;  Surgeon: Unk Corinn Skiff, MD;  Location:               [facility] ENDOSCOPY;  Service: Gastroenterology;  Laterality:               N/A; 1980: HEMICOLECTOMY     Comment:  all small intestine, none large 1960s, 1978, 1980: OTHER SURGICAL HISTORY     Comment:  ileal resection x 3 and subtotal colon resection 07/28/2023: POLYPECTOMY     Comment:  Procedure: POLYPECTOMY INTESTINAL;  Surgeon: Unk Corinn Skiff, MD;  Location: [facility] ENDOSCOPY;  Service:               Gastroenterology;; No date: SMALL INTESTINE SURGERY 1975: TOTAL ABDOMINAL HYSTERECTOMY     Comment:   ovaries out as well No date: UPPER GASTROINTESTINAL ENDOSCOPY     Reproductive/Obstetrics negative OB ROS                              Anesthesia Physical Anesthesia Plan  ASA: 2  Anesthesia Plan: General   Post-op Pain Management:    Induction: Intravenous  PONV Risk Score and Plan: Propofol  infusion and TIVA  Airway Management Planned: Natural Airway and Nasal Cannula  Additional Equipment:   Intra-op Plan:   Post-operative Plan:   Informed Consent: I have reviewed the patients History and Physical, chart, labs and discussed the procedure including the risks, benefits and alternatives for the proposed anesthesia with the patient or authorized representative who has indicated his/her understanding and acceptance.     Dental Advisory Given  Plan Discussed with: Anesthesiologist, CRNA and Surgeon  Anesthesia Plan Comments: (Patient consented for risks of anesthesia including but not limited to:  - adverse reactions to medications - risk of airway placement if required - damage to eyes, teeth, lips or other oral mucosa - nerve damage due to positioning  - sore throat or hoarseness - Damage to heart, brain, nerves, lungs, other parts of body or loss of life  Patient voiced understanding and assent.)        Anesthesia Quick Evaluation

## 2024-07-20 NOTE — Anesthesia Postprocedure Evaluation (Signed)
 Anesthesia Post Note  Patient: Michelle Vasquez  Procedure(s) Performed: COLONOSCOPY POLYPECTOMY, INTESTINE  Patient location during evaluation: Endoscopy Anesthesia Type: General Level of consciousness: awake and alert Pain management: pain level controlled Vital Signs Assessment: post-procedure vital signs reviewed and stable Respiratory status: spontaneous breathing, nonlabored ventilation and respiratory function stable Cardiovascular status: blood pressure returned to baseline and stable Postop Assessment: no apparent nausea or vomiting Anesthetic complications: no   No notable events documented.   Last Vitals:  Vitals:   07/20/24 0914 07/20/24 0924  BP: 130/72 136/62  Pulse: (!) 123 66  Resp: 13 20  Temp:    SpO2: 95% 99%    Last Pain:  Vitals:   07/20/24 0902  TempSrc: Temporal  PainSc:                  [name] [name]

## 2024-07-21 ENCOUNTER — Other Ambulatory Visit: Payer: Self-pay | Admitting: Family Medicine

## 2024-07-21 LAB — SURGICAL PATHOLOGY

## 2024-07-21 MED ORDER — POLYMYXIN B-TRIMETHOPRIM 10000-0.1 UNIT/ML-% OP SOLN: 1.0000 [drp] | OPHTHALMIC | 0 refills | Status: AC

## 2024-07-21 NOTE — Telephone Encounter (Signed)
 So ear pain symptoms are improving but pressure and hearing loss persists - this can take several weeks to resolve - any improvement at all in those symptoms? Any fever or discharge from ear?  Will Rx antibiotic eye drops for eye symptoms.  Would offer ENT referral for further evaluation. Let me know if interested in referral.

## 2024-07-21 NOTE — Progress Notes (Signed)
 ERx polytrim  per phone note.

## 2024-07-21 NOTE — Telephone Encounter (Addendum)
 Patient called back. She finished abx yesterday. She had improvement with ear pain but pressure and hearing loss is the same. Her eyes have gotten a little better with otc eye drops. Denies any fever or dizziness. She has started having sore throat on right side today. She has been using saline nose spray. Patient has no way to get to office if you need to see her it will have to be next week.

## 2024-07-21 NOTE — Addendum Note (Signed)
 Addended by: [name] on: 07/21/2024 03:55 PM   Modules accepted: Orders

## 2024-07-21 NOTE — Telephone Encounter (Signed)
 Called patient reviewed all information and repeated back to me. Will call if any questions.  Denies any fever or discharge. She would like referral sent to office in [place]. She will pick up eye drops.

## 2024-07-21 NOTE — Telephone Encounter (Signed)
 Called left message for patient to call office she was given 10day of augmentin  on 07/12/24 when seen by Dr. Rilla. Do you need to see her in office for treatment.

## 2024-07-21 NOTE — Telephone Encounter (Signed)
 ENT referral placed to [place]

## 2024-07-26 ENCOUNTER — Ambulatory Visit: Payer: Self-pay | Admitting: Gastroenterology

## 2024-07-27 ENCOUNTER — Encounter: Payer: Self-pay | Admitting: Family Medicine

## 2024-08-09 ENCOUNTER — Encounter: Payer: Self-pay | Admitting: Family Medicine

## 2024-08-09 NOTE — Telephone Encounter (Signed)
 Teams sent to referral team to look at for me

## 2024-08-16 NOTE — Telephone Encounter (Signed)
 Called and scheduled appointment for patient. I have called and gave app information and location. She will call if any questions.

## 2024-08-21 ENCOUNTER — Encounter (INDEPENDENT_AMBULATORY_CARE_PROVIDER_SITE_OTHER): Payer: Self-pay | Admitting: Physician Assistant

## 2024-08-21 ENCOUNTER — Ambulatory Visit (INDEPENDENT_AMBULATORY_CARE_PROVIDER_SITE_OTHER): Admitting: Physician Assistant

## 2024-08-21 VITALS — BP 152/70 | HR 70 | Temp 97.9°F | Ht 59.0 in | Wt 116.0 lb

## 2024-08-21 DIAGNOSIS — H9191 Unspecified hearing loss, right ear: Secondary | ICD-10-CM

## 2024-08-21 MED ORDER — FLUTICASONE PROPIONATE 50 MCG/ACT NA SUSP: 2.0000 | Freq: Every day | NASAL | 6 refills | Status: AC

## 2024-08-21 NOTE — Progress Notes (Signed)
 Dear Dr. Rilla, Here is my assessment for our mutual patient, Michelle Vasquez. Thank you for allowing me the opportunity to care for your patient. Please do not hesitate to contact me should you have any other questions. Sincerely, Chyrl Cohen PA-C  Otolaryngology Clinic Note Referring provider: Dr. Rilla HPI:  Michelle Vasquez is a 73 y.o. female kindly referred by Dr. Rilla   Discussed the use of AI scribe software for clinical note transcription with the patient, who gave verbal consent to proceed.  History of Present Illness   Michelle Vasquez is a 73 year old female who presents with hearing loss in the right ear.  She experienced complete hearing loss in her right ear following an episode of otitis media in September. Treatment with Augmentin  alleviated her symptoms but did not restore her hearing. Prior to this, she had some hearing difficulty in the right ear and tinnitus in both ears, particularly noticeable when lying down at night.  She describes a three-week period of severe illness with symptoms including a persistent cough and congestion, which she still experiences on the right side of her face. Her right ear feels 'stopped up' and sounds are distorted. She experiences occasional dizziness, described as feeling off balance, which was not present prior to this illness. No ear pain, numbness, tingling, or weakness.  Her current medication regimen includes Claritin  for allergies, which she has been taking regularly except for today. She has used Afrin and nasal saline sprays but finds the saline causes irritation. She denies any history of recurrent ear infections or ear surgeries and does not recall having ear infections as a child. She has not been diagnosed with any other conditions like COVID during this illness.        Independent Review of Additional Tests or Records:  none   PMH/Meds/All/SocHx/FamHx/ROS:   Past Medical History:  Diagnosis Date   Allergy     seasonal   Anxiety and depression 07/03/2011   B12 deficiency    Blood transfusion without reported diagnosis    Cataract    Central hearing loss    COVID-19 virus infection 03/03/2021   Crohn disease (HCC)    s/p colectomy on , ?bacterial overgrowth   Facial cellulitis 01/15/2023   Fatty liver 12/15/2011   GERD (gastroesophageal reflux disease)    Heart murmur    History of small bowel obstruction    HTN (hypertension)    Internal hemorrhoids without mention of complication    Osteomalacia, unspecified    Perennial allergic rhinitis with seasonal variation    Squamous cell cancer of skin of left hand 11/2022   Dr Ninetta   Unspecified deficiency anemia    Unspecified polyarthropathy or polyarthritis, multiple sites      Past Surgical History:  Procedure Laterality Date   APPENDECTOMY     BIOPSY  07/28/2023   Procedure: BIOPSY;  Surgeon: Unk Corinn Skiff, MD;  Location: [facility] ENDOSCOPY;  Service: Gastroenterology;;   COLONOSCOPY  02/25/2005   Internal hemms; crohns; narrow anast   COLONOSCOPY  02/26/2010   Prior right hemicolectomy o/w benign (Dr. Obie)   COLONOSCOPY  02/2015   benign biopsies, crohn's with stenotic stricture at anastomosis Priscilla)   COLONOSCOPY  12/2019   small adenomas, crohn's disease small intestine, diverticulosis, non-patent end to side ileocolonic anastomosis with inflammation and ulceration biopsied, rpt 3 yrs (Brahmbhatt)   COLONOSCOPY N/A 07/20/2024   TAx3, reassuring biopsies of small intestine and colon, rpt 3 yrs (Vanga, Corinn Skiff, MD)   COLONOSCOPY  WITH ESOPHAGOGASTRODUODENOSCOPY (EGD)  09/2023   COLONOSCOPY WITH PROPOFOL  N/A 07/28/2023   Procedure: COLONOSCOPY WITH PROPOFOL ;  Surgeon: Unk Corinn Skiff, MD;  Location: [facility] ENDOSCOPY;  Service: Gastroenterology;  Laterality: N/A;   DEXA  07/06/2011   normal T score -1.0 femur, spine   ESOPHAGOGASTRODUODENOSCOPY  02/25/2005   Dilated stricture   ESOPHAGOGASTRODUODENOSCOPY  10/2017    esophageal stenosis dilated, duodenal diverticuluc (Nandigam)   ESOPHAGOGASTRODUODENOSCOPY (EGD) WITH PROPOFOL  N/A 07/28/2023   Procedure: ESOPHAGOGASTRODUODENOSCOPY (EGD) WITH PROPOFOL ;  Surgeon: Unk Corinn Skiff, MD;  Location: [facility] ENDOSCOPY;  Service: Gastroenterology;  Laterality: N/A;   HEMICOLECTOMY  1980   all small intestine, none large   OTHER SURGICAL HISTORY  1960s, 1978, 1980   ileal resection x 3 and subtotal colon resection   POLYPECTOMY  07/28/2023   Procedure: POLYPECTOMY INTESTINAL;  Surgeon: Unk Corinn Skiff, MD;  Location: [facility] ENDOSCOPY;  Service: Gastroenterology;;   POLYPECTOMY  07/20/2024   Procedure: POLYPECTOMY, INTESTINE;  Surgeon: Unk Corinn Skiff, MD;  Location: [facility] ENDOSCOPY;  Service: Gastroenterology;;   SMALL INTESTINE SURGERY     TOTAL ABDOMINAL HYSTERECTOMY  1975   ovaries out as well   UPPER GASTROINTESTINAL ENDOSCOPY      Family History  Problem Relation Age of Onset   Crohn's disease Father        colostomy   Hypertension Mother    Heart disease Mother 22   Alcohol abuse Brother    Cirrhosis Brother    Crohn's disease Sister    Crohn's disease Paternal Aunt        x 3   Crohn's disease Other        neice x 2   Colon cancer Neg Hx    Cancer Neg Hx    Diabetes Neg Hx    Rectal cancer Neg Hx    Stomach cancer Neg Hx    Esophageal cancer Neg Hx    Breast cancer Neg Hx      Social Connections: Unknown (07/13/2024)   Received from [facility]   Social Connection and Isolation Panel    Frequency of Communication with Friends and Family: Not on file    How often do you get together with friends or relatives?: Once a week    How often do you attend church or religious services?: More than 4 times per year    Do you belong to any clubs or organizations such as church groups, unions, fraternal or athletic groups, or school groups?: No    How often do you attend meetings of the clubs or organizations you belong to?:  Never    Are you married, widowed, divorced, separated, never married, or living with a partner?: Widowed  Recent Concern: Social Connections - Socially Isolated (06/15/2024)   Social Connection and Isolation Panel    Frequency of Communication with Friends and Family: Once a week    Frequency of Social Gatherings with Friends and Family: Never    Attends Religious Services: 1 to 4 times per year    Active Member of [employer] or Organizations: No    Attends Banker Meetings: Not on file    Marital Status: Widowed      Current Outpatient Medications:    amLODipine  (NORVASC ) 10 MG tablet, Take 1 tablet (10 mg total) by mouth daily., Disp: 90 tablet, Rfl: 3   calcium  carbonate (ANTACID CALCIUM ) 500 MG chewable tablet, Chew 1 tablet (200 mg of elemental calcium  total) by mouth daily., Disp: , Rfl:  cyanocobalamin  (VITAMIN B12) 1000 MCG/ML injection, Inject 1 mL (1,000 mcg total) into the skin every 30 (thirty) days., Disp: 10 mL, Rfl: 1   folic acid  (FOLVITE ) 800 MCG tablet, Take 1 tablet (800 mcg total) by mouth daily., Disp: , Rfl:    levothyroxine  (SYNTHROID ) 50 MCG tablet, Take 1 tablet (50 mcg total) by mouth daily before breakfast., Disp: 90 tablet, Rfl: 1   magnesium  chloride (SLOW-MAG) 64 MG TBEC SR tablet, Take 2 tablets by mouth 2 (two) times daily., Disp: , Rfl:    pantoprazole  (PROTONIX ) 40 MG tablet, Take 1 tablet (40 mg total) by mouth daily., Disp: 90 tablet, Rfl: 3   predniSONE  (DELTASONE ) 5 MG tablet, TAKE ONE TABLET (5 MG TOTAL) BY MOUTH DAILY WITH BREAKFAST., Disp: 90 tablet, Rfl: 1   propranolol  (INDERAL ) 20 MG tablet, Take 1 tablet (20 mg total) by mouth 2 (two) times daily., Disp: 180 tablet, Rfl: 3   SYRINGE/NEEDLE, DISP, 1 ML 23G X 1 1 ML MISC, Use as directed, Disp: 8 each, Rfl: 0   traZODone  (DESYREL ) 50 MG tablet, Take 0.5-1 tablets (25-50 mg total) by mouth at bedtime as needed for sleep., Disp: 30 tablet, Rfl: 3   valACYclovir (VALTREX) 1000 MG tablet,  Take 4,000 mg by mouth once., Disp: , Rfl:    Vedolizumab  (ENTYVIO  PEN) 108 MG/0.68ML SOAJ, Inject 1 Pen into the skin every 14 (fourteen) days., Disp: 4.08 mL, Rfl: 3   Vitamin D , Ergocalciferol , (DRISDOL ) 1.25 MG (50000 UNIT) CAPS capsule, Take 1 capsule (50,000 Units total) by mouth every 7 (seven) days., Disp: 12 capsule, Rfl: 3  Current Facility-Administered Medications:    denosumab  (PROLIA ) injection 60 mg, 60 mg, Subcutaneous, Once, [name], MD   denosumab  (PROLIA ) injection 60 mg, 60 mg, Subcutaneous, Q6 months, [name], MD   NOREEN ON 10/28/2024] denosumab  (PROLIA ) injection 60 mg, 60 mg, Subcutaneous, Once, Cleatus Arlyss RAMAN, MD   Physical Exam:   BP (!) 152/70   Pulse 70   Temp 97.9 F (36.6 C)   Ht 4' 11 (1.499 m)   Wt 116 lb (52.6 kg)   SpO2 94%   BMI 23.43 kg/m   Pertinent Findings  CN II-XII intact Bilateral EAC clear and TM intact with well pneumatized middle ear spaces Weber 512: localizes left  Rinne 512: AC > BC b/l  Anterior rhinoscopy: Septum midline; bilateral inferior turbinates with mild hypertrophy  No lesions of oral cavity/oropharynx; dentition wnl No obviously palpable neck masses/lymphadenopathy/thyromegaly No respiratory distress or stridor       Seprately Identifiable Procedures:  None  Impression & Plans:  Michelle Vasquez is a 73 y.o. female with the following   Assessment and Plan    Right ear hearing loss-  Suspect secondary to recent otitis media.  No other associated symptoms.  - Prescribe Flonase  daily. - Continue Claritin  daily. - Advise sinus irrigation with saline. - Order hearing evaluation. - Instruct to avoid Afrin. - Advise to monitor for alarming symptoms and return if she has any concerning signs or symptoms       - f/u phone call discussion after audiological evaluation, 57-month follow-up in the office   Thank you for allowing me the opportunity to care for your patient. Please do not hesitate to  contact me should you have any other questions.  Sincerely, Chyrl Cohen PA-C [facility] Phone: [phone] Fax: [phone]  08/21/2024, 11:13 AM

## 2024-08-21 NOTE — Progress Notes (Signed)
 Patient is having BP managed.

## 2024-08-21 NOTE — Patient Instructions (Signed)
 Please follow up in our clinic in three months after using Flonase , Claritin , and saline irrigation of your sinuses. Come back sooner if have nay questions. I will call you with your results of the hearing test.

## 2024-08-22 ENCOUNTER — Other Ambulatory Visit (INDEPENDENT_AMBULATORY_CARE_PROVIDER_SITE_OTHER): Payer: Self-pay | Admitting: Physician Assistant

## 2024-08-22 DIAGNOSIS — H9191 Unspecified hearing loss, right ear: Secondary | ICD-10-CM

## 2024-08-23 ENCOUNTER — Ambulatory Visit

## 2024-08-23 DIAGNOSIS — E538 Deficiency of other specified B group vitamins: Secondary | ICD-10-CM

## 2024-08-23 MED ORDER — CYANOCOBALAMIN 1000 MCG/ML IJ SOLN
1000.0000 ug | Freq: Once | INTRAMUSCULAR | Status: AC
  Administered 2024-08-23: 1000 ug via INTRAMUSCULAR

## 2024-08-23 NOTE — Progress Notes (Signed)
 Per orders of [employer] DPN AGNP-C, injection of B-12 given by Delores, Sarahbeth Cashin D in right deltoid. Patient tolerated injection well. Patient will make appointment for 1 month.

## 2024-09-04 ENCOUNTER — Ambulatory Visit (INDEPENDENT_AMBULATORY_CARE_PROVIDER_SITE_OTHER): Admitting: Audiology

## 2024-09-04 ENCOUNTER — Telehealth (INDEPENDENT_AMBULATORY_CARE_PROVIDER_SITE_OTHER): Payer: Self-pay | Admitting: Physician Assistant

## 2024-09-04 DIAGNOSIS — H906 Mixed conductive and sensorineural hearing loss, bilateral: Secondary | ICD-10-CM

## 2024-09-04 NOTE — Telephone Encounter (Signed)
 Discussed audio with the patient, right type B, left type C. Continue medications until office follow up.

## 2024-09-04 NOTE — Progress Notes (Signed)
  [address], [address] [place], [place] [address] [phone]  Audiological Evaluation    Name: Michelle Vasquez     DOB:   01/02/1951      MRN:   [id number]                                                                                     Service Date: 09/04/2024     Accompanied by: unaccompanied   Patient comes today after Reyes Cohen, PA-C sent a referral for a hearing evaluation due to concerns with hearing loss.   Symptoms Yes Details  Hearing loss  [x]  Right is worse than left  Tinnitus  [x]  Longstanding in both ears  Ear pain/ infections/pressure  [x]  Pressure/pain worse in the right ear, starting to have some in the left ear  Balance problems  []    Noise exposure history  []    Previous ear surgeries  []    Family history of hearing loss  []    Amplification  []    Other  []      Otoscopy: Right ear: Clear external ear canal and notable landmarks visualized on the tympanic membrane. Left ear:  Clear external ear canal and notable landmarks visualized on the tympanic membrane.  Tympanometry:  Right ear: Normal external ear canal volume with no middle ear pressure peak or tympanic membrane compliance (Type B).   Findings are suggestive of abnormal middle ear function, such as middle ear effusion. Results are consistent with the presence of middle ear effusion.  Left ear: Normal external ear canal volume with negative middle ear pressure and normal tympanic membrane compliance (Type C). Findings are suggestive of abnormal middle ear function.    Hearing Evaluation The hearing test results were completed under headphones and results are deemed to be of good reliability. Test technique:  conventional    Pure tone Audiometry: Right ear- Mild to profound mixed hearing loss from 250 Hz - 8000 Hz. Left ear-  Mild to severe mixed hearing loss from 250 Hz - 8000 Hz.  Speech Audiometry: Right ear- Speech Reception Threshold (SRT) was obtained at 65 dBHL, with  contralateral masking  Left ear-Speech Reception Threshold (SRT) was obtained at 50 dBHL.   Word Recognition Score Tested using NU-6 (recorded) Right ear: 92% was obtained at a presentation level of 100 dBHL with contralateral masking which is deemed as  excellent. Left ear: 96% was obtained at a presentation level of 90 dBHL with contralateral masking which is deemed as  excellent.  Recommendations: Follow up with ENT as per MD. Repeat audiogram after medical care.   Michelle Vasquez, AUD

## 2024-09-26 ENCOUNTER — Ambulatory Visit

## 2024-09-28 DIAGNOSIS — K5000 Crohn's disease of small intestine without complications: Secondary | ICD-10-CM | POA: Diagnosis not present

## 2024-10-09 ENCOUNTER — Other Ambulatory Visit: Payer: Self-pay | Admitting: "Endocrinology

## 2024-10-11 ENCOUNTER — Other Ambulatory Visit: Payer: Self-pay | Admitting: Family Medicine

## 2024-10-11 ENCOUNTER — Other Ambulatory Visit (HOSPITAL_COMMUNITY): Payer: Self-pay

## 2024-10-12 ENCOUNTER — Other Ambulatory Visit: Payer: Self-pay

## 2024-10-16 ENCOUNTER — Other Ambulatory Visit (HOSPITAL_COMMUNITY): Payer: Self-pay

## 2024-10-18 ENCOUNTER — Other Ambulatory Visit: Payer: Self-pay

## 2024-10-20 ENCOUNTER — Ambulatory Visit: Admitting: Family Medicine

## 2024-10-20 ENCOUNTER — Encounter: Payer: Self-pay | Admitting: Family Medicine

## 2024-10-20 ENCOUNTER — Telehealth: Payer: Self-pay | Admitting: Family Medicine

## 2024-10-20 VITALS — BP 130/78 | HR 61 | Temp 97.6°F | Ht 59.0 in | Wt 119.6 lb

## 2024-10-20 DIAGNOSIS — M818 Other osteoporosis without current pathological fracture: Secondary | ICD-10-CM

## 2024-10-20 DIAGNOSIS — Z23 Encounter for immunization: Secondary | ICD-10-CM | POA: Diagnosis not present

## 2024-10-20 DIAGNOSIS — I10 Essential (primary) hypertension: Secondary | ICD-10-CM | POA: Diagnosis not present

## 2024-10-20 DIAGNOSIS — E538 Deficiency of other specified B group vitamins: Secondary | ICD-10-CM

## 2024-10-20 DIAGNOSIS — E559 Vitamin D deficiency, unspecified: Secondary | ICD-10-CM | POA: Diagnosis not present

## 2024-10-20 DIAGNOSIS — K50118 Crohn's disease of large intestine with other complication: Secondary | ICD-10-CM | POA: Diagnosis not present

## 2024-10-20 DIAGNOSIS — E2740 Unspecified adrenocortical insufficiency: Secondary | ICD-10-CM | POA: Diagnosis not present

## 2024-10-20 LAB — BASIC METABOLIC PANEL WITH GFR
BUN: 16 mg/dL (ref 6–23)
CO2: 24 meq/L (ref 19–32)
Calcium: 9.3 mg/dL (ref 8.4–10.5)
Chloride: 107 meq/L (ref 96–112)
Creatinine, Ser: 0.93 mg/dL (ref 0.40–1.20)
GFR: 60.69 mL/min
Glucose, Bld: 79 mg/dL (ref 70–99)
Potassium: 3.6 meq/L (ref 3.5–5.1)
Sodium: 142 meq/L (ref 135–145)

## 2024-10-20 LAB — VITAMIN D 25 HYDROXY (VIT D DEFICIENCY, FRACTURES): VITD: 21.7 ng/mL — ABNORMAL LOW (ref 30.00–100.00)

## 2024-10-20 MED ORDER — CYANOCOBALAMIN 1000 MCG/ML IJ SOLN
1000.0000 ug | Freq: Once | INTRAMUSCULAR | Status: AC
  Administered 2024-10-20: 1000 ug via INTRAMUSCULAR

## 2024-10-20 NOTE — Telephone Encounter (Signed)
 Order is in chart for verification by pharmacy will hold to make sure processed.

## 2024-10-20 NOTE — Assessment & Plan Note (Signed)
 Continue monthly b12 shots - one provided today.

## 2024-10-20 NOTE — Assessment & Plan Note (Signed)
 On Entyvio .  Appreciate GI care, recent colonoscopy reassuring.

## 2024-10-20 NOTE — Assessment & Plan Note (Signed)
Update levels on weekly replacement.  

## 2024-10-20 NOTE — Progress Notes (Signed)
 " Ph: [phone] Fax: [phone]   Patient ID: Michelle Vasquez, female    DOB: 11/25/1950, 73 y.o.   MRN: [id number]  This visit was conducted in person.  BP 130/78 (BP Location: Left Arm, Patient Position: Sitting, Cuff Size: Normal)   Pulse 61   Temp 97.6 F (36.4 C) (Oral)   Ht 4' 11 (1.499 m)   Wt 119 lb 9.6 oz (54.3 kg)   SpO2 97%   BMI 24.16 kg/m    CC: 37mo follow up visit  Subjective:   HPI: Michelle Vasquez is a 73 y.o. female presenting on 10/20/2024 for Medical Management of Chronic Issues (Pt states she is feeling better and has no concerns//Would like to do B12 and flu shot today)   OP - on prolia , latest injection 04/27/2024  H/o hypoaldosteronism sees endo on prednisone  5mg  daily.   Recent colonoscopy reviewed.  Colonoscopy 06/2024 - TAx3, reassuring biopsies of small intestine and colon, rpt 3 yrs (Vanga, Corinn Skiff, MD)   Continues monthly B12 shots, latest 08/23/2024  Has been seeing ENT and audiology for R hearing loss with chronic tinnitus - planned f/u 3 months.      Relevant past medical, surgical, family and social history reviewed and updated as indicated. Interim medical history since our last visit reviewed. Allergies and medications reviewed and updated. Outpatient Medications Prior to Visit  Medication Sig Dispense Refill   amLODipine  (NORVASC ) 10 MG tablet Take 1 tablet (10 mg total) by mouth daily. 90 tablet 3   cetirizine (ZYRTEC) 10 MG tablet Take 10 mg by mouth daily.     cyanocobalamin  (VITAMIN B12) 1000 MCG/ML injection Inject 1 mL (1,000 mcg total) into the skin every 30 (thirty) days. 10 mL 1   fluticasone  (FLONASE ) 50 MCG/ACT nasal spray Place 2 sprays into both nostrils daily. 16 g 6   folic acid  (FOLVITE ) 800 MCG tablet Take 1 tablet (800 mcg total) by mouth daily.     levothyroxine  (SYNTHROID ) 50 MCG tablet Take 1 tablet (50 mcg total) by mouth daily before breakfast. 90 tablet 1   magnesium  chloride (SLOW-MAG) 64 MG TBEC SR  tablet Take 2 tablets by mouth 2 (two) times daily.     pantoprazole  (PROTONIX ) 40 MG tablet Take 1 tablet (40 mg total) by mouth daily. 90 tablet 3   predniSONE  (DELTASONE ) 5 MG tablet TAKE ONE TABLET (5 MG TOTAL) BY MOUTH DAILY WITH BREAKFAST. 90 tablet 1   propranolol  (INDERAL ) 20 MG tablet Take 1 tablet (20 mg total) by mouth 2 (two) times daily. 180 tablet 3   traZODone  (DESYREL ) 50 MG tablet Take 0.5-1 tablets (25-50 mg total) by mouth at bedtime as needed for sleep. 30 tablet 3   Vedolizumab  (ENTYVIO  PEN) 108 MG/0.68ML SOAJ Inject 1 Pen into the skin every 14 (fourteen) days. 4.08 mL 3   Vitamin D , Ergocalciferol , (DRISDOL ) 1.25 MG (50000 UNIT) CAPS capsule Take 1 capsule (50,000 Units total) by mouth every 7 (seven) days. 12 capsule 3   calcium  carbonate (ANTACID CALCIUM ) 500 MG chewable tablet Chew 1 tablet (200 mg of elemental calcium  total) by mouth daily.     SYRINGE/NEEDLE, DISP, 1 ML 23G X 1 1 ML MISC Use as directed 8 each 0   valACYclovir (VALTREX) 1000 MG tablet Take 4,000 mg by mouth once.     Facility-Administered Medications Prior to Visit  Medication Dose Route Frequency Provider Last Rate Last Admin   denosumab  (PROLIA ) injection 60 mg  60 mg Subcutaneous Once  [name], MD       [START ON 10/28/2024] denosumab  (PROLIA ) injection 60 mg  60 mg Subcutaneous Once Cleatus Arlyss RAMAN, MD         Per HPI unless specifically indicated in ROS section below Review of Systems  Objective:  BP 130/78 (BP Location: Left Arm, Patient Position: Sitting, Cuff Size: Normal)   Pulse 61   Temp 97.6 F (36.4 C) (Oral)   Ht 4' 11 (1.499 m)   Wt 119 lb 9.6 oz (54.3 kg)   SpO2 97%   BMI 24.16 kg/m   Wt Readings from Last 3 Encounters:  10/20/24 119 lb 9.6 oz (54.3 kg)  08/21/24 116 lb (52.6 kg)  07/20/24 112 lb (50.8 kg)      Physical Exam Vitals and nursing note reviewed.  Constitutional:      Appearance: Normal appearance. She is not ill-appearing.  HENT:     Head:  Normocephalic and atraumatic.     Mouth/Throat:     Mouth: Mucous membranes are moist.     Pharynx: Oropharynx is clear. No oropharyngeal exudate or posterior oropharyngeal erythema.  Eyes:     Extraocular Movements: Extraocular movements intact.     Pupils: Pupils are equal, round, and reactive to light.  Neck:     Thyroid : No thyroid  mass or thyromegaly.  Cardiovascular:     Rate and Rhythm: Normal rate and regular rhythm.     Pulses: Normal pulses.     Heart sounds: Normal heart sounds. No murmur heard. Pulmonary:     Effort: Pulmonary effort is normal. No respiratory distress.     Breath sounds: Normal breath sounds. No wheezing, rhonchi or rales.  Musculoskeletal:     Cervical back: Normal range of motion and neck supple.     Right lower leg: No edema.     Left lower leg: No edema.  Skin:    General: Skin is warm and dry.     Findings: No rash.  Neurological:     Mental Status: She is alert.  Psychiatric:        Mood and Affect: Mood normal.        Behavior: Behavior normal.       Results for orders placed or performed during the hospital encounter of 07/20/24  Surgical pathology   Collection Time: 07/20/24 12:00 AM  Result Value Ref Range   SURGICAL PATHOLOGY      SURGICAL PATHOLOGY [facility] [address], [address] [place], [place] [address] Telephone [phone] or [phone] Fax [phone]  REPORT OF SURGICAL PATHOLOGY   Accession #: [id number] Patient Name: Michelle, Vasquez Visit # : [id number]  MRN: [id number] Physician: Unk Cotton DOB/Age May 23, 1951 (Age: 43) Gender: F Collected Date: 07/20/2024 Received Date: 07/20/2024  FINAL DIAGNOSIS       1. Ileocecal valve, biopsy, cbx anastomosis/terminal ileum :       - UNREMARKABLE SMALL INTESTINAL MUCOSA.      - NEGATIVE FOR GRANULOMAS, DYSPLASIA, AND MALIGNANCY.       2. Colon, biopsy, cbx random right :       - UNREMARKABLE COLONIC MUCOSA.      - NEGATIVE FOR  GRANULOMAS, DYSPLASIA, AND MALIGNANCY.       3. Transverse Colon Polyp, cold snare :       - TUBULAR ADENOMA.      - NEGATIVE FOR HIGH-GRADE DYSPLASIA AND MALIGNANCY.       4. Descending Colon Polyp, cold snare :       -  TUBULAR ADENOMA.      - NEGATIVE FOR HIGH-GRADE DYSPLAS IA AND MALIGNANCY.       5. Colon, biopsy, cbx random left :       - UNREMARKABLE COLONIC MUCOSA.      - NEGATIVE FOR GRANULOMAS, DYSPLASIA, AND MALIGNANCY.       6. Sigmoid  Colon Polyp, cold snare :       - TUBULAR ADENOMA.      - NEGATIVE FOR HIGH-GRADE DYSPLASIA AND MALIGNANCY.       7. Sigmoid Colon Biopsy, cbx :       - UNREMARKABLE COLONIC MUCOSA.      - NEGATIVE FOR GRANULOMAS, DYSPLASIA, AND MALIGNANCY.       ELECTRONIC SIGNATURE : Rubinas Md, Rexene , Sports Administrator, Electronic Signature  MICROSCOPIC DESCRIPTION  CASE COMMENTS STAINS USED IN DIAGNOSIS: H&E H&E H&E H&E H&E H&E H&E H&E H&E H&E H&E-2 H&E-2 H&E    CLINICAL HISTORY  SPECIMEN(S) OBTAINED 1. Ileocecal valve, biopsy, Cbx Anastomosis/terminal Ileum 2. Colon, biopsy, Cbx Random Right 3. Transverse Colon Polyp, Cold Snare 4. Descending Colon Polyp, Cold Snare 5. Colon, biopsy, Cbx Random Left 6. Sigmoid  Colon Polyp, Cold Snare 7. Sigmoid Colon Biopsy, Cbx  SPECIMEN COMMENT S: SPECIMEN CLINICAL INFORMATION: 1. Polyps    Gross Description 1. Received in formalin are tan, soft tissue fragments that are submitted in toto.Number:  3,   Size:  0.3 cm, 1 block. 2. Received in formalin are tan, soft tissue fragments that are submitted in toto.Number:  9,   Size:  0.3 cm, 2 blocks. 3. Received in formalin are tan, soft tissue fragments that are submitted in toto.Number:  6,   Size:  0.3 cm, 1 block. 4. Received in formalin are tan, soft tissue fragments that are submitted in toto.Number:  10,   Size:  0.3 cm, 2 blocks. 5. Received in formalin are tan, soft tissue fragments that are submitted in toto.Number:   multiple,   Size:  0.4 cm, 2 blocks. 6. Received in formalin are tan, soft tissue fragments that are submitted in toto.Number:  multiple,   Size:  0.6 cm, 2 blocks. 7. Received in formalin are tan, soft tissue fragments that are submitted in toto.Number:  5,   Size:  0.4 cm, 1 block.mb 07-20-24        Report signed out from the foll owing location(s) [place]. [facility] [address] [place], [place] [address] CLIA #: [id number]  [facility] [address] [place], [place] [address] CLIA #: [id number]    Lab Results  Component Value Date   VITAMINB12 392 06/09/2024   Lab Results  Component Value Date   VD25OH 17.78 (L) 06/09/2024    Assessment & Plan:   Problem List Items Addressed This Visit     Crohn's disease (HCC) (Chronic)   On Entyvio .  Appreciate GI care, recent colonoscopy reassuring.      Adrenal insufficiency (Chronic)   On low dose prednisone , appreciate endo care.       Vitamin B12 deficiency   Continue monthly b12 shots - one provided today.       Essential hypertension   Chronic, stable. Continue current regimen.       Osteoporosis - Primary   Continues monthly prolia  shots Will be due for Prolia  next week - will send request for insurance approval to Prolia  team. Check BMP, vit D today.       Relevant Orders   Basic metabolic panel  with GFR   VITAMIN D  25 Hydroxy (Vit-D Deficiency, Fractures)   Vitamin D  deficiency   Update levels on weekly replacement.       Other Visit Diagnoses       Encounter for immunization       Relevant Orders   Flu vaccine HIGH DOSE PF(Fluzone Trivalent) (Completed)        Meds ordered this encounter  Medications   cyanocobalamin  (VITAMIN B12) injection 1,000 mcg    Orders Placed This Encounter  Procedures   Flu vaccine HIGH DOSE PF(Fluzone Trivalent)   Basic metabolic panel with GFR   VITAMIN D  25 Hydroxy (Vit-D Deficiency, Fractures)    Patient Instructions  Labs  today  Flu shot today  B12 shot today  We will work on setting up prolia  after 10/28/2024.  Good to see you today  Return as needed or in 8 months for physical/wellness visit  Follow up plan: Return in about 8 months (around 06/20/2025) for annual exam, prior fasting for blood work, medicare wellness visit.  Anton Blas, MD   "

## 2024-10-20 NOTE — Telephone Encounter (Signed)
 Next prolia  shot due 10/28/2024 Can we start Prolia  process  for patient? Thank you

## 2024-10-20 NOTE — Assessment & Plan Note (Signed)
 On low dose prednisone , appreciate endo care.

## 2024-10-20 NOTE — Assessment & Plan Note (Addendum)
 Continues monthly prolia  shots Will be due for Prolia  next week - will send request for insurance approval to Prolia  team. Check BMP, vit D today.

## 2024-10-20 NOTE — Assessment & Plan Note (Signed)
 Chronic, stable. Continue current regimen.

## 2024-10-20 NOTE — Patient Instructions (Addendum)
 Labs today  Flu shot today  B12 shot today  We will work on setting up prolia  after 10/28/2024.  Good to see you today  Return as needed or in 8 months for physical/wellness visit

## 2024-10-23 ENCOUNTER — Ambulatory Visit: Payer: Self-pay | Admitting: Family Medicine

## 2024-10-27 ENCOUNTER — Other Ambulatory Visit (HOSPITAL_COMMUNITY): Payer: Self-pay

## 2024-10-27 ENCOUNTER — Telehealth: Payer: Self-pay

## 2024-10-27 NOTE — Telephone Encounter (Signed)
 Prolia  VOB initiated via [email or web address]  Next Prolia  inj DUE: 10/28/24   PHARMACY COPAY: $12.65

## 2024-10-30 NOTE — Progress Notes (Signed)
 Michelle Vasquez                                          MRN: [id number]   10/30/2024   The [facility] Quality Team Specialist reviewed this patient medical record for the purposes of chart review for care gap closure. The following were reviewed: abstraction for care gap closure-controlling blood pressure.    VBCI Quality Team

## 2024-10-31 ENCOUNTER — Other Ambulatory Visit (HOSPITAL_COMMUNITY): Payer: Self-pay

## 2024-11-06 ENCOUNTER — Other Ambulatory Visit (HOSPITAL_COMMUNITY): Payer: Self-pay

## 2024-11-06 NOTE — Telephone Encounter (Signed)
 Michelle Vasquez

## 2024-11-06 NOTE — Telephone Encounter (Signed)
 MEDICAL PA SUBMITTED VIA LATENT. KEY: [id number]

## 2024-11-07 NOTE — Telephone Encounter (Signed)
 Pt ready for scheduling for PROLIA  on or after : 11/07/24  Option# 1: Buy/Bill (Office supplied medication)  Out-of-pocket cost due at time of clinic visit: $362  Number of injection/visits approved: 2  Primary: [employer] Prolia  co-insurance: 20% (following Medicare guideline) Admin fee co-insurance: $10  Secondary: --- Prolia  co-insurance:  Admin fee co-insurance:   Medical Benefit Details: Date Benefits were checked: 11/06/24 Deductible: no/ Coinsurance: 20%/ Admin Fee: $10  Prior Auth: APPROVED PA# [id number] Expiration Date: 11/06/24-10/25/25   # of doses approved: 2 ----------------------------------------------------------------------- Option# 2- Med Obtained from pharmacy:  Pharmacy benefit: Copay $12.65 (Paid to pharmacy) Admin Fee: $10 (Pay at clinic)  Prior Auth: N/A PA# Expiration Date:   # of doses approved:   If patient wants fill through the pharmacy benefit please send prescription to: [facility], and include estimated need by date in rx notes. Pharmacy will ship medication directly to the office.  Patient NOT eligible for Prolia  Copay Card. Copay Card can make patient's cost as little as $25. Link to apply: [email or web address]://[email or web address]  ** This summary of benefits is an estimation of the patient's out-of-pocket cost. Exact cost may very based on individual plan coverage.

## 2024-11-10 ENCOUNTER — Other Ambulatory Visit: Payer: Self-pay

## 2024-11-10 ENCOUNTER — Other Ambulatory Visit: Payer: Self-pay | Admitting: *Deleted

## 2024-11-10 DIAGNOSIS — M818 Other osteoporosis without current pathological fracture: Secondary | ICD-10-CM

## 2024-11-10 MED ORDER — DENOSUMAB 60 MG/ML ~~LOC~~ SOSY
60.0000 mg | PREFILLED_SYRINGE | Freq: Once | SUBCUTANEOUS | 0 refills | Status: AC
  Filled 2024-11-10: qty 1, 1d supply, fill #0
  Filled 2024-11-13: qty 1, 180d supply, fill #0

## 2024-11-13 ENCOUNTER — Telehealth: Payer: Self-pay

## 2024-11-13 ENCOUNTER — Other Ambulatory Visit: Payer: Self-pay

## 2024-11-13 ENCOUNTER — Other Ambulatory Visit: Payer: Self-pay | Admitting: Pharmacy Technician

## 2024-11-13 NOTE — Progress Notes (Signed)
 Specialty Pharmacy Refill Coordination Note  Michelle Vasquez is a 74 y.o. female assessed today regarding refills of clinic administered specialty medication(s) Denosumab  (PROLIA )   Clinic requested Courier to Provider Office   Delivery date: 11/15/24   Verified address: [facility]   Medication will be filled on: 11/14/24

## 2024-11-13 NOTE — Telephone Encounter (Signed)
 Copied from CRM [id number]. Topic: Appointments - Appointment Scheduling >> Nov 13, 2024  1:57 PM Viola FALCON wrote: Amy from [facility] pharmacy called regarding the Prolia  Injection - she is due and requested for the office to reach out to patient and get her scheduled. Copayment is $0

## 2024-11-14 ENCOUNTER — Other Ambulatory Visit: Payer: Self-pay

## 2024-11-14 NOTE — Telephone Encounter (Signed)
 I have already spoken to the pt about this. She needed speak to her daughter to figure out when she can come. I will follow up with the pt this week and get her scheduled.

## 2024-11-15 ENCOUNTER — Other Ambulatory Visit: Payer: Self-pay

## 2024-11-15 ENCOUNTER — Other Ambulatory Visit: Payer: Self-pay | Admitting: "Endocrinology

## 2024-11-16 ENCOUNTER — Other Ambulatory Visit: Payer: Self-pay | Admitting: *Deleted

## 2024-11-17 ENCOUNTER — Telehealth: Payer: Self-pay | Admitting: Family Medicine

## 2024-11-17 NOTE — Telephone Encounter (Signed)
 This message was from yesterday. I have already spoken with the pt.

## 2024-11-17 NOTE — Telephone Encounter (Signed)
 Copied from CRM #[id number]. Topic: Appointments - Scheduling Inquiry for Clinic >> Nov 16, 2024  3:55 PM Donna BRAVO wrote: Reason for CRM: Patient wants to speak with  Manuelita specially.

## 2024-11-21 ENCOUNTER — Other Ambulatory Visit (INDEPENDENT_AMBULATORY_CARE_PROVIDER_SITE_OTHER)

## 2024-11-21 DIAGNOSIS — M818 Other osteoporosis without current pathological fracture: Secondary | ICD-10-CM

## 2024-11-21 LAB — BASIC METABOLIC PANEL WITH GFR
BUN: 16 mg/dL (ref 6–23)
CO2: 27 meq/L (ref 19–32)
Calcium: 9.3 mg/dL (ref 8.4–10.5)
Chloride: 106 meq/L (ref 96–112)
Creatinine, Ser: 0.93 mg/dL (ref 0.40–1.20)
GFR: 60.66 mL/min
Glucose, Bld: 104 mg/dL — ABNORMAL HIGH (ref 70–99)
Potassium: 3.1 meq/L — ABNORMAL LOW (ref 3.5–5.1)
Sodium: 142 meq/L (ref 135–145)

## 2024-11-22 ENCOUNTER — Ambulatory Visit: Payer: Self-pay | Admitting: Family Medicine

## 2024-11-22 ENCOUNTER — Other Ambulatory Visit

## 2024-11-22 MED ORDER — POTASSIUM CHLORIDE ER 10 MEQ PO TBCR: 10.0000 meq | EXTENDED_RELEASE_TABLET | Freq: Every day | ORAL | 1 refills | Status: AC

## 2024-11-23 ENCOUNTER — Other Ambulatory Visit

## 2024-11-24 ENCOUNTER — Ambulatory Visit (INDEPENDENT_AMBULATORY_CARE_PROVIDER_SITE_OTHER): Admitting: Physician Assistant

## 2024-11-24 ENCOUNTER — Encounter (INDEPENDENT_AMBULATORY_CARE_PROVIDER_SITE_OTHER): Payer: Self-pay | Admitting: Physician Assistant

## 2024-11-24 DIAGNOSIS — H908 Mixed conductive and sensorineural hearing loss, unspecified: Secondary | ICD-10-CM

## 2024-11-24 DIAGNOSIS — H906 Mixed conductive and sensorineural hearing loss, bilateral: Secondary | ICD-10-CM

## 2024-11-29 NOTE — Progress Notes (Signed)
 Dear Dr. Rilla, Here is my assessment for our mutual patient, Michelle Vasquez. Thank you for allowing me the opportunity to care for your patient. Please do not hesitate to contact me should you have any other questions. Sincerely, Chyrl Cohen PA-C  Otolaryngology Clinic Note Referring provider: Dr. Rilla HPI:  Michelle Vasquez is a 74 y.o. female kindly referred by Dr. Rilla   Discussed the use of AI scribe software for clinical note transcription with the patient, who gave verbal consent to proceed.  History of Present Illness   Michelle Vasquez is a 74 year old female with mixed conductive and sensorineural hearing loss who presents for follow-up evaluation.  She was last seen in the office on 08/21/2024.  Below is a recap of that encounter.   Michelle Vasquez is a 74 year old female who presents with hearing loss in the right ear.   She experienced complete hearing loss in her right ear following an episode of otitis media in September. Treatment with Augmentin  alleviated her symptoms but did not restore her hearing. Prior to this, she had some hearing difficulty in the right ear and tinnitus in both ears, particularly noticeable when lying down at night.   She describes a three-week period of severe illness with symptoms including a persistent cough and congestion, which she still experiences on the right side of her face. Her right ear feels 'stopped up' and sounds are distorted. She experiences occasional dizziness, described as feeling off balance, which was not present prior to this illness. No ear pain, numbness, tingling, or weakness.   Her current medication regimen includes Claritin  for allergies, which she has been taking regularly except for today. She has used Afrin and nasal saline sprays but finds the saline causes irritation. She denies any history of recurrent ear infections or ear surgeries and does not recall having ear infections as a child. She has not been  diagnosed with any other conditions like COVID during this illness.       Update 11/24/2024  She experienced an episode of worsened hearing loss and otalgia prior to her last visit, which improved by early December, with hearing and pain returning to baseline. She currently denies otalgia and new hearing difficulties, describing her hearing as normal except for persistent tinnitus.  She continues to perceive ringing in the ears, which she did not notice previously. She reports baseline hearing loss and particular difficulty understanding speech when others are wearing masks.  She previously used intranasal fluticasone , which provided some subjective improvement in her symptoms. She is not currently using any other specific medications for otologic symptoms. She denies new otologic complaints aside from baseline hearing loss and tinnitus.           Independent Review of Additional Tests or Records:  None   PMH/Meds/All/SocHx/FamHx/ROS:   Past Medical History:  Diagnosis Date   Allergy    seasonal   Anxiety and depression 07/03/2011   B12 deficiency    Blood transfusion without reported diagnosis    Cataract    Central hearing loss    COVID-19 virus infection 03/03/2021   Crohn disease (HCC)    s/p colectomy on , ?bacterial overgrowth   Facial cellulitis 01/15/2023   Fatty liver 12/15/2011   GERD (gastroesophageal reflux disease)    Heart murmur    History of small bowel obstruction    HTN (hypertension)    Internal hemorrhoids without mention of complication    Osteomalacia, unspecified    Perennial allergic rhinitis  with seasonal variation    Squamous cell cancer of skin of left hand 11/2022   Dr Ninetta   Unspecified deficiency anemia    Unspecified polyarthropathy or polyarthritis, multiple sites      Past Surgical History:  Procedure Laterality Date   APPENDECTOMY     BIOPSY  07/28/2023   Procedure: BIOPSY;  Surgeon: Unk Corinn Skiff, MD;  Location: [facility]  ENDOSCOPY;  Service: Gastroenterology;;   COLONOSCOPY  02/25/2005   Internal hemms; crohns; narrow anast   COLONOSCOPY  02/26/2010   Prior right hemicolectomy o/w benign (Dr. Obie)   COLONOSCOPY  02/2015   benign biopsies, crohn's with stenotic stricture at anastomosis Priscilla)   COLONOSCOPY  12/2019   small adenomas, crohn's disease small intestine, diverticulosis, non-patent end to side ileocolonic anastomosis with inflammation and ulceration biopsied, rpt 3 yrs (Brahmbhatt)   COLONOSCOPY N/A 07/20/2024   a   COLONOSCOPY WITH ESOPHAGOGASTRODUODENOSCOPY (EGD)  09/2023   COLONOSCOPY WITH PROPOFOL  N/A 07/28/2023   Procedure: COLONOSCOPY WITH PROPOFOL ;  Surgeon: Unk Corinn Skiff, MD;  Location: [facility] ENDOSCOPY;  Service: Gastroenterology;  Laterality: N/A;   DEXA  07/06/2011   normal T score -1.0 femur, spine   ESOPHAGOGASTRODUODENOSCOPY  02/25/2005   Dilated stricture   ESOPHAGOGASTRODUODENOSCOPY  10/2017   esophageal stenosis dilated, duodenal diverticuluc (Nandigam)   ESOPHAGOGASTRODUODENOSCOPY (EGD) WITH PROPOFOL  N/A 07/28/2023   Procedure: ESOPHAGOGASTRODUODENOSCOPY (EGD) WITH PROPOFOL ;  Surgeon: Unk Corinn Skiff, MD;  Location: [facility] ENDOSCOPY;  Service: Gastroenterology;  Laterality: N/A;   HEMICOLECTOMY  1980   all small intestine, none large   OTHER SURGICAL HISTORY  1960s, 1978, 1980   ileal resection x 3 and subtotal colon resection   POLYPECTOMY  07/28/2023   Procedure: POLYPECTOMY INTESTINAL;  Surgeon: Unk Corinn Skiff, MD;  Location: [facility] ENDOSCOPY;  Service: Gastroenterology;;   POLYPECTOMY  07/20/2024   Procedure: POLYPECTOMY, INTESTINE;  Surgeon: Unk Corinn Skiff, MD;  Location: [facility] ENDOSCOPY;  Service: Gastroenterology;;   SMALL INTESTINE SURGERY     TOTAL ABDOMINAL HYSTERECTOMY  1975   ovaries out as well   UPPER GASTROINTESTINAL ENDOSCOPY      Family History  Problem Relation Age of Onset   Crohn's disease Father        colostomy   Hearing loss  Father    Hypertension Mother    Heart disease Mother 55   Alcohol abuse Brother    Cirrhosis Brother    Crohn's disease Sister    Crohn's disease Paternal Aunt        x 3   Crohn's disease Other        neice x 2   Anxiety disorder Daughter    Drug abuse Daughter    Arthritis Daughter    Colon cancer Neg Hx    Cancer Neg Hx    Diabetes Neg Hx    Rectal cancer Neg Hx    Stomach cancer Neg Hx    Esophageal cancer Neg Hx    Breast cancer Neg Hx      Social Connections: Unknown (10/16/2024)   Social Connection and Isolation Panel    Frequency of Communication with Friends and Family: Once a week    Frequency of Social Gatherings with Friends and Family: Patient declined    Attends Religious Services: More than 4 times per year    Active Member of [employer] or Organizations: No    Attends Engineer, Structural: Not on file    Marital Status: Divorced     Current Medications[1]  Physical Exam:   BP 122/73 Comment: 149/82 first attempt  Pulse 68   Temp 97.7 F (36.5 C)   SpO2 95%   Pertinent Findings  CN II-XII grossly intact Bilateral EAC clear and TM intact with well pneumatized middle ear spaces Anterior rhinoscopy: Septum midline; bilateral inferior turbinates with mild hypertrophy No lesions of oral cavity/oropharynx No obviously palpable neck masses/lymphadenopathy/thyromegaly No respiratory distress or stridor       Seprately Identifiable Procedures:  None  Impression & Plans:  Tyshea Imel is a 74 y.o. female with the following   Assessment and Plan    Mixed conductive and sensorineural hearing loss Mixed hearing loss with resolved recent episode of worsened hearing and otalgia. Persistent tinnitus. Anticipated repeat audiogram to reflect baseline hearing loss. Further management if middle ear effusion persists. - Ordered repeat audiogram to assess current hearing status and confirm resolution of conductive component, particularly in the right  ear. - Arranged follow-up telephone visit to review audiogram results and assess for persistent middle ear effusion. - Discussed further management if effusion persists on audiogram. - Reviewed hearing amplification as potential future intervention if baseline hearing loss remains problematic.           - f/u Office phone visit with results    Thank you for allowing me the opportunity to care for your patient. Please do not hesitate to contact me should you have any other questions.  Sincerely, Chyrl Cohen PA-C [facility] Phone: [phone] Fax: [phone]  11/29/2024, 10:58 AM        [1]  Current Outpatient Medications:    amLODipine  (NORVASC ) 10 MG tablet, Take 1 tablet (10 mg total) by mouth daily., Disp: 90 tablet, Rfl: 3   cetirizine (ZYRTEC) 10 MG tablet, Take 10 mg by mouth daily., Disp: , Rfl:    cyanocobalamin  (VITAMIN B12) 1000 MCG/ML injection, Inject 1 mL (1,000 mcg total) into the skin every 30 (thirty) days., Disp: 10 mL, Rfl: 1   fluticasone  (FLONASE ) 50 MCG/ACT nasal spray, Place 2 sprays into both nostrils daily., Disp: 16 g, Rfl: 6   folic acid  (FOLVITE ) 800 MCG tablet, Take 1 tablet (800 mcg total) by mouth daily., Disp: , Rfl:    levothyroxine  (SYNTHROID ) 50 MCG tablet, TAKE 1 TABLET (50 MCG TOTAL) BY MOUTH DAILY BEFORE BREAKFAST., Disp: 90 tablet, Rfl: 1   magnesium  chloride (SLOW-MAG) 64 MG TBEC SR tablet, Take 2 tablets by mouth 2 (two) times daily., Disp: , Rfl:    pantoprazole  (PROTONIX ) 40 MG tablet, Take 1 tablet (40 mg total) by mouth daily., Disp: 90 tablet, Rfl: 3   potassium chloride  (KLOR-CON ) 10 MEQ tablet, Take 1 tablet (10 mEq total) by mouth daily., Disp: 90 tablet, Rfl: 1   predniSONE  (DELTASONE ) 5 MG tablet, TAKE ONE TABLET (5 MG TOTAL) BY MOUTH DAILY WITH BREAKFAST., Disp: 90 tablet, Rfl: 1   propranolol  (INDERAL ) 20 MG tablet, Take 1 tablet (20 mg total) by mouth 2 (two) times daily., Disp: 180 tablet, Rfl: 3   traZODone   (DESYREL ) 50 MG tablet, Take 0.5-1 tablets (25-50 mg total) by mouth at bedtime as needed for sleep., Disp: 30 tablet, Rfl: 3   valACYclovir (VALTREX) 1000 MG tablet, Take 4,000 mg by mouth once., Disp: , Rfl:    Vedolizumab  (ENTYVIO  PEN) 108 MG/0.68ML SOAJ, Inject 1 Pen into the skin every 14 (fourteen) days., Disp: 4.08 mL, Rfl: 3   Vitamin D , Ergocalciferol , (DRISDOL ) 1.25 MG (50000 UNIT) CAPS capsule, Take 1 capsule (50,000 Units total)  by mouth every 7 (seven) days., Disp: 12 capsule, Rfl: 3  Current Facility-Administered Medications:    denosumab  (PROLIA ) injection 60 mg, 60 mg, Subcutaneous, Once, [name], MD   denosumab  (PROLIA ) injection 60 mg, 60 mg, Subcutaneous, Once, Cleatus Arlyss RAMAN, MD

## 2024-12-06 ENCOUNTER — Ambulatory Visit

## 2024-12-13 ENCOUNTER — Ambulatory Visit: Admitting: "Endocrinology

## 2024-12-28 ENCOUNTER — Ambulatory Visit (INDEPENDENT_AMBULATORY_CARE_PROVIDER_SITE_OTHER): Admitting: Physician Assistant

## 2024-12-28 ENCOUNTER — Ambulatory Visit (INDEPENDENT_AMBULATORY_CARE_PROVIDER_SITE_OTHER): Admitting: Audiology

## 2025-05-11 ENCOUNTER — Ambulatory Visit

## 2025-06-13 ENCOUNTER — Other Ambulatory Visit

## 2025-06-20 ENCOUNTER — Encounter: Admitting: Family Medicine
